# Patient Record
Sex: Female | Born: 1945 | Race: White | Hispanic: No | Marital: Married | Smoking: Former smoker
Health system: Southern US, Community
[De-identification: ages and names within clinical notes are randomized; demographics above are authoritative.]

## PROBLEM LIST (undated history)

## (undated) DIAGNOSIS — IMO0002 Reserved for concepts with insufficient information to code with codable children: Secondary | ICD-10-CM

## (undated) DIAGNOSIS — H409 Unspecified glaucoma: Secondary | ICD-10-CM

## (undated) DIAGNOSIS — I10 Essential (primary) hypertension: Secondary | ICD-10-CM

## (undated) DIAGNOSIS — I209 Angina pectoris, unspecified: Secondary | ICD-10-CM

## (undated) DIAGNOSIS — C801 Malignant (primary) neoplasm, unspecified: Secondary | ICD-10-CM

## (undated) DIAGNOSIS — IMO0001 Reserved for inherently not codable concepts without codable children: Secondary | ICD-10-CM

## (undated) DIAGNOSIS — N189 Chronic kidney disease, unspecified: Secondary | ICD-10-CM

## (undated) HISTORY — PX: DILATION AND CURETTAGE OF UTERUS: SHX78

## (undated) HISTORY — DX: Reserved for concepts with insufficient information to code with codable children: IMO0002

## (undated) HISTORY — DX: Reserved for inherently not codable concepts without codable children: IMO0001

## (undated) HISTORY — DX: Unspecified glaucoma: H40.9

## (undated) HISTORY — PX: OTHER SURGICAL HISTORY: SHX169

## (undated) HISTORY — PX: BACK SURGERY: SHX140

## (undated) HISTORY — PX: TUBAL LIGATION: SHX77

## (undated) HISTORY — PX: LITHOTRIPSY: SUR834

---

## 1998-06-17 ENCOUNTER — Other Ambulatory Visit: Admission: RE | Admit: 1998-06-17 | Discharge: 1998-06-17 | Payer: Self-pay | Admitting: Gynecology

## 1998-12-23 ENCOUNTER — Ambulatory Visit (HOSPITAL_COMMUNITY): Admission: RE | Admit: 1998-12-23 | Discharge: 1998-12-23 | Payer: Self-pay | Admitting: Urology

## 1998-12-23 ENCOUNTER — Encounter: Payer: Self-pay | Admitting: Urology

## 1999-06-30 ENCOUNTER — Other Ambulatory Visit: Admission: RE | Admit: 1999-06-30 | Discharge: 1999-06-30 | Payer: Self-pay | Admitting: Gynecology

## 2000-07-05 ENCOUNTER — Other Ambulatory Visit: Admission: RE | Admit: 2000-07-05 | Discharge: 2000-07-05 | Payer: Self-pay | Admitting: Gynecology

## 2000-08-02 ENCOUNTER — Encounter (INDEPENDENT_AMBULATORY_CARE_PROVIDER_SITE_OTHER): Payer: Self-pay | Admitting: Specialist

## 2000-08-02 ENCOUNTER — Other Ambulatory Visit: Admission: RE | Admit: 2000-08-02 | Discharge: 2000-08-02 | Payer: Self-pay | Admitting: Gynecology

## 2001-08-01 ENCOUNTER — Other Ambulatory Visit: Admission: RE | Admit: 2001-08-01 | Discharge: 2001-08-01 | Payer: Self-pay | Admitting: Gynecology

## 2001-10-09 ENCOUNTER — Ambulatory Visit (HOSPITAL_BASED_OUTPATIENT_CLINIC_OR_DEPARTMENT_OTHER): Admission: RE | Admit: 2001-10-09 | Discharge: 2001-10-09 | Payer: Self-pay | Admitting: Urology

## 2001-11-11 ENCOUNTER — Other Ambulatory Visit: Admission: RE | Admit: 2001-11-11 | Discharge: 2001-11-11 | Payer: Self-pay | Admitting: Dermatology

## 2002-08-05 ENCOUNTER — Other Ambulatory Visit: Admission: RE | Admit: 2002-08-05 | Discharge: 2002-08-05 | Payer: Self-pay | Admitting: Gynecology

## 2003-08-24 ENCOUNTER — Other Ambulatory Visit: Admission: RE | Admit: 2003-08-24 | Discharge: 2003-08-24 | Payer: Self-pay | Admitting: Gynecology

## 2004-09-05 ENCOUNTER — Other Ambulatory Visit: Admission: RE | Admit: 2004-09-05 | Discharge: 2004-09-05 | Payer: Self-pay | Admitting: Gynecology

## 2004-10-19 ENCOUNTER — Ambulatory Visit: Payer: Self-pay | Admitting: Internal Medicine

## 2005-10-02 ENCOUNTER — Other Ambulatory Visit: Admission: RE | Admit: 2005-10-02 | Discharge: 2005-10-02 | Payer: Self-pay | Admitting: Gynecology

## 2006-06-14 ENCOUNTER — Ambulatory Visit: Payer: Self-pay | Admitting: Internal Medicine

## 2006-08-29 ENCOUNTER — Ambulatory Visit: Payer: Self-pay | Admitting: Internal Medicine

## 2006-09-05 ENCOUNTER — Ambulatory Visit: Payer: Self-pay | Admitting: Internal Medicine

## 2008-12-16 ENCOUNTER — Ambulatory Visit (HOSPITAL_COMMUNITY): Admission: RE | Admit: 2008-12-16 | Discharge: 2008-12-16 | Payer: Self-pay | Admitting: Neurological Surgery

## 2009-01-21 ENCOUNTER — Ambulatory Visit (HOSPITAL_COMMUNITY): Admission: RE | Admit: 2009-01-21 | Discharge: 2009-01-21 | Payer: Self-pay | Admitting: Neurological Surgery

## 2011-02-03 DIAGNOSIS — R072 Precordial pain: Secondary | ICD-10-CM

## 2011-02-23 LAB — BASIC METABOLIC PANEL
BUN: 14 mg/dL (ref 6–23)
CO2: 29 mEq/L (ref 19–32)
Calcium: 10.1 mg/dL (ref 8.4–10.5)
Chloride: 104 mEq/L (ref 96–112)
Creatinine, Ser: 0.96 mg/dL (ref 0.4–1.2)
GFR calc Af Amer: >60 mL/min (ref >60)
GFR calc non Af Amer: 59 mL/min — ABNORMAL LOW (ref >60)
Glucose, Bld: 122 mg/dL — ABNORMAL HIGH (ref 70–99)
Potassium: 3.4 mEq/L — ABNORMAL LOW (ref 3.5–5.1)
Sodium: 143 mEq/L (ref 135–145)

## 2011-02-23 LAB — GLUCOSE, CAPILLARY
Glucose-Capillary: 130 mg/dL — ABNORMAL HIGH (ref 70–99)
Glucose-Capillary: 159 mg/dL — ABNORMAL HIGH (ref 70–99)

## 2011-02-23 LAB — CBC
Hemoglobin: 14.2 g/dL (ref 12.0–15.0)
MCHC: 35.3 g/dL (ref 30.0–36.0)
RBC: 4.43 MIL/uL (ref 3.87–5.11)
WBC: 8.3 10*3/uL (ref 4.0–10.5)

## 2011-03-28 NOTE — Op Note (Signed)
NAMEAUBRIEE, SZETO                ACCOUNT NO.:  [id number]   MEDICAL RECORD NO.:  [id number]          PATIENT TYPE:  OIB   LOCATION:  3526                         FACILITY:  [facility]   PHYSICIAN:  Stefani Dama, M.D.  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   PROCEDURE:  Microdiskectomy L3-L4 right with operating microscope  microdissection technique.   SURGEON:  Stefani Dama, MD   FIRST ASSISTANT:  Aura Fey. Dennison Bulla, [place]   ANESTHESIA:  General endotracheal.   INDICATIONS:  Pam Vazquez is a 65 year old individual who has previously  undergone decompression and fusion at L4-L5 secondary to degenerative  spondylolisthesis.  She did well for the last 15 years but has now  developed onset of weakness with initial pain in the right lower  extremity but now she is left with weakness in the iliopsoas and the  quad.  She has been advised regarding surgical decompression for a large  extruded fragment of disk at L3-4 behind the body of L3.   PROCEDURE:  The patient was brought to the operating room supine on a  stretcher.  After smooth induction of general endotracheal anesthesia,  she was turned prone.  The back was prepped with alcohol and DuraPrep  and draped in a sterile fashion.  Midline incision was created and  carried down to the lumbodorsal fascia on the left side.  The fascia was  opened on that side and subperiosteal dissection was performed at the L3-  L4 space which was identified positively with a radiograph.  Then, a  laminotomy was performed removing the inferior margin of the lamina of  L3.  Because the disk protruded significantly behind the body of L3, a  hemilaminectomy was  required and this was ultimately performed opening  the laminectomy site with a 2 and 3-mm Kerrison punch.  Underlying this,  the L3 nerve root was identified superiorly.  The L4 nerve root was  identified inferiorly.  There was a substantial amount of facet  arthrosis which was encountered and this was resected removing the  inferior facet joint partially at the L3-L4 joint.  With this, the disk  space was identified and it was noted to be bulging dorsally.  The  common dural tube was retracted medially and the disk space was incised.  Significant quantity of severely degenerated and desiccated disk  material was encountered.  The disk space was then entered and evacuated  of a substantial quantity of severely desiccated material also.  The  disk space was then explored medially and laterally and once all the  disk material that was loose and able to be resected was removed.  The  area was then checked for hemostasis in the soft tissues and the path of  the L3-L4 nerve roots were each identified and found to be free and  clear of any obstruction.  With this hemostasis in the soft tissues was obtained and the  lumbodorsal fascia was closed with #1 Vicryl in interrupted fashion, 2-0  Vicryl was used subcutaneously, 3-0 Vicryl subcuticularly and Dermabond  was placed on the skin.  The patient tolerated the procedure well.  Blood loss was estimated less than 100 mL.      Stefani Dama, M.D.  Electronically Signed     HJE/MEDQ  D:  01/21/2009  T:  01/21/2009  Job:  [id number]

## 2011-03-31 NOTE — Assessment & Plan Note (Signed)
[facility]                           GASTROENTEROLOGY OFFICE NOTE   NAME:Vazquez Vazquez KEENY                       MRN:          [id number]  DATE:06/14/2006                            DOB:          May 21, 1946    The patient is self-referred.   REASON FOR CONSULTATION:  Screening colonoscopy.   HISTORY:  This is a pleasant 65 year old white female with a history of type  2 diabetes mellitus, hypertension, kidney stones, and recent problems with  pyelonephritis who presents herself regarding screening colonoscopy.  The  patient's husband, Vazquez Vazquez, is a patient of mine.  The patient herself had  initiated a screening colonoscopy in December of 2005, though subsequently  canceled due to the cost.  She now wishes to reschedule at this time.  She  denies any active GI symptoms such as abdominal pain, change in bowel  habits, or rectal bleeding.   PAST MEDICAL HISTORY:  1.  Hypertension.  2.  Diabetes mellitus.  3.  History of kidney stones.  4.  Recent pyelonephritis.   PAST SURGICAL HISTORY:  1.  Tubal ligation.  2.  Breast surgery for fibrocystic disease.  3.  Lumbar spinal fusion.   ALLERGIES:  No known drug allergies.   CURRENT MEDICATIONS:  1.  Atenolol 50 mg p.o. daily.  2.  Triamterine/hydrochlorothiazide 37.5/25 mg daily.  3.  Premarin 0.625 mg daily.  4.  Prometrium 100 mg daily.  5.  Multivitamin daily.  6.  Aspirin 81 mg daily.  7.  Calcium 1200 mg daily.  8.  Fish oil, two daily.  9.  Potassium chloride 20 mEq daily.  10. Metformin 500 mg p.o. q.a.m.  11. Aleve p.r.n.   FAMILY HISTORY:  No family history of colon cancer or colon polyps.   SOCIAL HISTORY:  The patient is married with two children.  She is a Engineer, structural.  She worked for [employer] for 33 years, then  retired.  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  She does not smoke or use alcohol.   REVIEW OF  SYSTEMS:  Per diagnosis evaluation form.   PHYSICAL EXAMINATION:  GENERAL:  Well-appearing female in no acute distress.  VITAL SIGNS:  Blood pressure is 108/74, heart rate is 64, weight is 175.8  pounds, she is 5 feet 2 inches in height.  HEENT:  Sclerae anicteric.  Conjunctivae are pink.  Oral mucosa intact.  No  adenopathy.  LUNGS:  Clear to auscultation and percussion.  HEART:  Regular.  ABDOMEN:  Soft without tenderness, mass, or hernia.  Good bowel sounds  heard.  RECTAL:  Exam is deferred.  EXTREMITIES:  Without edema.   LABORATORIES:  Laboratories from Dr. Margo Vazquez on May 23, 2006 reveal white  blood cell count of 15.3, hemoglobin 12.1, platelet count 164,000.  Electrolytes were normal.  Glucose elevated at 194.   X-RAY FINDINGS:  Recent CT scan to evaluate right-sided abdominal, pelvic,  and flank pain revealed no acute abnormality.  She was noted to have a  calcified soft tissue lesion involving the  small bowel and uterus which was  felt possibly to be a fibroid.  Apparently, her gynecologist is aware.   IMPRESSION:  This is a 65 year old diabetic who presents regarding screening  colonoscopy.  She is an appropriate candidate without contraindication.  The  nature of the procedure, as well as the risks, benefits, and alternatives  have been reviewed.  She understood and agreed to proceed.  We will hold her  oral diabetic agent the day of her exam.                                   Vazquez Vazquez. Vazquez Vazquez., MD   JNP/MedQ  DD:  06/14/2006  DT:  06/14/2006  Job #:  [id number]   cc:   Vazquez Potter, MD

## 2011-03-31 NOTE — Op Note (Signed)
[facility]  Patient:    Pam Vazquez, Pam Vazquez Visit Number: [id number] MRN: [id number]          Service Type: NES Location: [facility] Attending Physician:  Lauree Chandler Dictated by:   Maretta Bees. Vonita Moss, M.D. Proc. Date: 10/09/01 Admit Date:  10/09/2001   CC:         Dietrich Pates, M.D. [facility]   Operative Report  PREOPERATIVE DIAGNOSIS:  Distal left ureteral calculus.  POSTOPERATIVE DIAGNOSIS:  Distal left ureteral calculus.  PROCEDURE:  Cystoscopy, left ureteroscopy and left ureteroscopic stone basketing and removal.  SURGEON:  Maretta Bees. Vonita Moss, M.D.  ANESTHESIA:  General.  INDICATIONS FOR PROCEDURE:  This 65 year old lady has had a past history of stone disease and has been bothered recently by intermittent left flank pain which was fairly severe yesterday. She had been previously scheduled for cystoscopy and ureteroscopy today and has not passed a stone.  She was advised about the risks of infection, bleeding, or persistent obstruction.  DESCRIPTION OF PROCEDURE:  The patient was brought to the operating room and placed in lithotomy position. The external genitalia were prepped and draped in the usual fashion. She was cystoscoped and her bladder was unremarkable. A guidewire was placed up under fluoroscopic control into the left ureter and it seemed to slip easily past what I thought was her stone. I then inserted the 6 French ureteroscope and just beyond the intramural ureter I found a yellow irregular calculus which I eventually removed with a nitinol stone basket. The stone was removed intact. There was no unusual injury or trauma to the ureter after rescoping her and therefore I felt it was appropriate to leave out a double J catheter, so removed the guidewire, drained the bladder, put xylocaine jelly in the bladder. She was given a B&O suppository and taken to the recovery room in good condition. Dictated by:   Maretta Bees. Vonita Moss,  M.D. Attending Physician:  Lauree Chandler DD:  10/09/01 TD:  10/09/01 Job: [id number]

## 2011-11-09 ENCOUNTER — Other Ambulatory Visit (HOSPITAL_COMMUNITY): Payer: Self-pay | Admitting: Gynecology

## 2011-11-09 DIAGNOSIS — N950 Postmenopausal bleeding: Secondary | ICD-10-CM

## 2011-11-13 ENCOUNTER — Ambulatory Visit (HOSPITAL_COMMUNITY)
Admission: RE | Admit: 2011-11-13 | Discharge: 2011-11-13 | Disposition: A | Discharge status: Home / Self Care | Payer: PRIVATE HEALTH INSURANCE | Source: Ambulatory Visit | Attending: Gynecology | Admitting: Gynecology

## 2011-11-13 DIAGNOSIS — N950 Postmenopausal bleeding: Secondary | ICD-10-CM

## 2011-11-17 ENCOUNTER — Encounter (HOSPITAL_COMMUNITY): Payer: Self-pay | Admitting: Pharmacist

## 2011-11-17 ENCOUNTER — Encounter (HOSPITAL_COMMUNITY): Payer: Self-pay | Admitting: *Deleted

## 2011-11-20 ENCOUNTER — Encounter (HOSPITAL_COMMUNITY)
Admission: RE | Admit: 2011-11-20 | Discharge: 2011-11-20 | Disposition: A | Discharge status: Home / Self Care | Payer: PRIVATE HEALTH INSURANCE | Source: Ambulatory Visit | Attending: Obstetrics and Gynecology | Admitting: Obstetrics and Gynecology

## 2011-11-20 ENCOUNTER — Encounter (HOSPITAL_COMMUNITY): Payer: Self-pay

## 2011-11-20 ENCOUNTER — Other Ambulatory Visit: Payer: Self-pay

## 2011-11-20 LAB — BASIC METABOLIC PANEL
BUN: 17 mg/dL (ref 6–23)
CO2: 31 mEq/L (ref 19–32)
Calcium: 10.9 mg/dL — ABNORMAL HIGH (ref 8.4–10.5)
Chloride: 100 mEq/L (ref 96–112)
Creatinine, Ser: 0.84 mg/dL (ref 0.50–1.10)
GFR calc Af Amer: 83 mL/min — ABNORMAL LOW (ref >90)
GFR calc non Af Amer: 72 mL/min — ABNORMAL LOW (ref >90)
Glucose, Bld: 110 mg/dL — ABNORMAL HIGH (ref 70–99)
Potassium: 3.9 mEq/L (ref 3.5–5.1)
Sodium: 138 mEq/L (ref 135–145)

## 2011-11-20 LAB — CBC
HCT: 41.8 % (ref 36.0–46.0)
Hemoglobin: 14 g/dL (ref 12.0–15.0)
MCH: 31.2 pg (ref 26.0–34.0)
MCHC: 33.5 g/dL (ref 30.0–36.0)
MCV: 93.1 fL (ref 78.0–100.0)
Platelets: 246 10*3/uL (ref 150–400)
RBC: 4.49 MIL/uL (ref 3.87–5.11)
RDW: 13.5 % (ref 11.5–15.5)
WBC: 9.4 10*3/uL (ref 4.0–10.5)

## 2011-11-20 MED ORDER — LACTATED RINGERS IV SOLN: INTRAVENOUS | Status: DC

## 2011-11-20 NOTE — Patient Instructions (Signed)
   Your procedure is scheduled on: January 8 at 11:30am  Enter through the Main Entrance of [facility] at:1000 Pick up the phone at the desk and dial [phone] and inform us of your arrival.  Please call this number if you have any problems the morning of surgery: [phone]  Remember: Do not eat food after midnight: Tonight Do not drink clear liquids after:0700 Take these medicines the morning of surgery with a SIP OF WATER:  Lisinopril Lopressor  Do not wear jewelry, make-up, or FINGER nail polish Do not wear lotions, powders, perfumes or deodorant. Do not shave 48 hours prior to surgery. Do not bring valuables to the hospital.  Leave suitcase in the car. After Surgery it may be brought to your room. For patients being admitted to the hospital, checkout time is 11:00am the day of discharge.  Patients discharged on the day of surgery will not be allowed to drive home.     Remember to use your hibiclens as instructed.Please shower with 1/2 bottle the evening before your surgery and the other 1/2 bottle the morning of surgery.

## 2011-11-21 ENCOUNTER — Other Ambulatory Visit: Payer: Self-pay | Admitting: Obstetrics and Gynecology

## 2011-11-21 ENCOUNTER — Encounter (HOSPITAL_COMMUNITY): Payer: Self-pay | Admitting: Anesthesiology

## 2011-11-21 ENCOUNTER — Encounter (HOSPITAL_COMMUNITY)
Admission: RE | Disposition: A | Discharge status: Home / Self Care | Payer: Self-pay | Source: Ambulatory Visit | Attending: Obstetrics and Gynecology

## 2011-11-21 ENCOUNTER — Ambulatory Visit (HOSPITAL_COMMUNITY): Payer: PRIVATE HEALTH INSURANCE | Admitting: Anesthesiology

## 2011-11-21 ENCOUNTER — Ambulatory Visit (HOSPITAL_COMMUNITY)
Admission: RE | Admit: 2011-11-21 | Discharge: 2011-11-21 | Disposition: A | Discharge status: Home / Self Care | Payer: PRIVATE HEALTH INSURANCE | Source: Ambulatory Visit | Attending: Obstetrics and Gynecology | Admitting: Obstetrics and Gynecology

## 2011-11-21 DIAGNOSIS — Z01812 Encounter for preprocedural laboratory examination: Secondary | ICD-10-CM | POA: Insufficient documentation

## 2011-11-21 DIAGNOSIS — E119 Type 2 diabetes mellitus without complications: Secondary | ICD-10-CM | POA: Insufficient documentation

## 2011-11-21 DIAGNOSIS — N950 Postmenopausal bleeding: Secondary | ICD-10-CM | POA: Insufficient documentation

## 2011-11-21 DIAGNOSIS — I10 Essential (primary) hypertension: Secondary | ICD-10-CM | POA: Insufficient documentation

## 2011-11-21 DIAGNOSIS — Z01818 Encounter for other preprocedural examination: Secondary | ICD-10-CM | POA: Insufficient documentation

## 2011-11-21 HISTORY — DX: Essential (primary) hypertension: I10

## 2011-11-21 HISTORY — PX: HYSTEROSCOPY WITH D & C: SHX1775

## 2011-11-21 LAB — GLUCOSE, CAPILLARY: Glucose-Capillary: 117 mg/dL — ABNORMAL HIGH (ref 70–99)

## 2011-11-21 SURGERY — DILATATION AND CURETTAGE /HYSTEROSCOPY
Anesthesia: General | Site: Vagina | Wound class: Clean Contaminated

## 2011-11-21 MED ORDER — GLYCINE 1.5 % IR SOLN
Status: DC | PRN
  Administered 2011-11-21: 3000 mL

## 2011-11-21 MED ORDER — FENTANYL CITRATE 0.05 MG/ML IJ SOLN
INTRAMUSCULAR | Status: AC
  Filled 2011-11-21: qty 2

## 2011-11-21 MED ORDER — ONDANSETRON HCL 4 MG/2ML IJ SOLN
INTRAMUSCULAR | Status: AC
  Filled 2011-11-21: qty 2

## 2011-11-21 MED ORDER — DEXAMETHASONE SODIUM PHOSPHATE 10 MG/ML IJ SOLN
INTRAMUSCULAR | Status: AC
  Filled 2011-11-21: qty 1

## 2011-11-21 MED ORDER — LACTATED RINGERS IV SOLN
INTRAVENOUS | Status: DC | PRN
  Administered 2011-11-21: 12:00:00 via INTRAVENOUS

## 2011-11-21 MED ORDER — KETOROLAC TROMETHAMINE 30 MG/ML IJ SOLN
INTRAMUSCULAR | Status: DC | PRN
  Administered 2011-11-21: 30 mg via INTRAVENOUS

## 2011-11-21 MED ORDER — KETOROLAC TROMETHAMINE 60 MG/2ML IM SOLN
INTRAMUSCULAR | Status: AC
  Filled 2011-11-21: qty 2

## 2011-11-21 MED ORDER — LIDOCAINE HCL (CARDIAC) 20 MG/ML IV SOLN
INTRAVENOUS | Status: DC | PRN
  Administered 2011-11-21: 80 mg via INTRAVENOUS

## 2011-11-21 MED ORDER — LIDOCAINE HCL 1 % IJ SOLN
INTRAMUSCULAR | Status: DC | PRN
  Administered 2011-11-21: 10 mL

## 2011-11-21 MED ORDER — LIDOCAINE HCL (CARDIAC) 20 MG/ML IV SOLN
INTRAVENOUS | Status: AC
  Filled 2011-11-21: qty 5

## 2011-11-21 MED ORDER — FENTANYL CITRATE 0.05 MG/ML IJ SOLN
INTRAMUSCULAR | Status: DC | PRN
  Administered 2011-11-21 (×2): 50 ug via INTRAVENOUS

## 2011-11-21 MED ORDER — DEXAMETHASONE SODIUM PHOSPHATE 4 MG/ML IJ SOLN
INTRAMUSCULAR | Status: DC | PRN
  Administered 2011-11-21: 4 mg via INTRAVENOUS

## 2011-11-21 MED ORDER — MIDAZOLAM HCL 5 MG/5ML IJ SOLN
INTRAMUSCULAR | Status: DC | PRN
  Administered 2011-11-21: 1 mg via INTRAVENOUS

## 2011-11-21 MED ORDER — KETOROLAC TROMETHAMINE 60 MG/2ML IM SOLN
INTRAMUSCULAR | Status: DC | PRN
  Administered 2011-11-21: 30 mg via INTRAMUSCULAR

## 2011-11-21 MED ORDER — PROPOFOL 10 MG/ML IV EMUL
INTRAVENOUS | Status: DC | PRN
  Administered 2011-11-21: 150 mg via INTRAVENOUS

## 2011-11-21 MED ORDER — PROPOFOL 10 MG/ML IV EMUL
INTRAVENOUS | Status: AC
  Filled 2011-11-21: qty 20

## 2011-11-21 MED ORDER — MIDAZOLAM HCL 2 MG/2ML IJ SOLN
INTRAMUSCULAR | Status: AC
  Filled 2011-11-21: qty 2

## 2011-11-21 MED ORDER — ONDANSETRON HCL 4 MG/2ML IJ SOLN
INTRAMUSCULAR | Status: DC | PRN
  Administered 2011-11-21: 4 mg via INTRAVENOUS

## 2011-11-21 MED ORDER — GLYCOPYRROLATE 0.2 MG/ML IJ SOLN
INTRAMUSCULAR | Status: AC
  Filled 2011-11-21: qty 1

## 2011-11-21 SURGICAL SUPPLY — 14 items
ABLATOR ENDOMETRIAL BIPOLAR (ABLATOR) IMPLANT
CANISTER SUCTION 2500CC (MISCELLANEOUS) ×2 IMPLANT
CATH ROBINSON RED A/P 16FR (CATHETERS) ×2 IMPLANT
CLOTH BEACON ORANGE TIMEOUT ST (SAFETY) ×2 IMPLANT
CONTAINER PREFILL 10% NBF 60ML (FORM) ×4 IMPLANT
ELECT REM PT RETURN 9FT ADLT (ELECTROSURGICAL)
ELECTRODE REM PT RTRN 9FT ADLT (ELECTROSURGICAL) IMPLANT
GLOVE BIO SURGEON STRL SZ7.5 (GLOVE) ×4 IMPLANT
GOWN PREVENTION PLUS LG XLONG (DISPOSABLE) ×2 IMPLANT
GOWN STRL REIN XL XLG (GOWN DISPOSABLE) ×2 IMPLANT
LOOP ANGLED CUTTING 22FR (CUTTING LOOP) IMPLANT
PACK HYSTEROSCOPY LF (CUSTOM PROCEDURE TRAY) ×2 IMPLANT
TOWEL OR 17X24 6PK STRL BLUE (TOWEL DISPOSABLE) ×4 IMPLANT
WATER STERILE IRR 1000ML POUR (IV SOLUTION) ×2 IMPLANT

## 2011-11-21 NOTE — Transfer of Care (Signed)
Immediate Anesthesia Transfer of Care Note  Patient: Pam Vazquez  Procedure(s) Performed:  DILATATION AND CURETTAGE /HYSTEROSCOPY  Patient Location: PACU  Anesthesia Type: MAC  Level of Consciousness: awake, alert  and oriented  Airway & Oxygen Therapy: Patient Spontanous Breathing and Patient connected to nasal cannula oxygen  Post-op Assessment: Report given to PACU RN and Patient moving all extremities X 4  Post vital signs: Reviewed and stable  Complications: No apparent anesthesia complications

## 2011-11-21 NOTE — Brief Op Note (Signed)
11/21/2011  11:58 AM  PATIENT:  Pam Vazquez  66 y.o. female with post menopausal bleeding and thickened endometrium  PRE-OPERATIVE DIAGNOSIS:  thickened endometrium  POST-OPERATIVE DIAGNOSIS:  thickened endometrium  PROCEDURE:  Procedure(s): DILATATION AND CURETTAGE /HYSTEROSCOPY  SURGEON:  Surgeon(s): Miguel Aschoff   ANESTHESIA:   general  EBL:   30cc  BLOOD ADMINISTERED:none  DRAINS: none   LOCAL MEDICATIONS USED:  LIDOCAINE 10 CC  SPECIMEN:  Source of Specimen:  endometrial currettings  DISPOSITION OF SPECIMEN:  PATHOLOGY  COUNTS:  YES  TOURNIQUET:  * No tourniquets in log *  DICTATION: .Other Dictation: Dictation Number [id number]  PLAN OF CARE: Discharge to home after PACU  PATIENT DISPOSITION:  PACU - hemodynamically stable.   Delay start of Pharmacological VTE agent (>24hrs) due to surgical blood loss or risk of bleeding:  {YES/NO/NOT APPLICABLE:20182

## 2011-11-21 NOTE — Anesthesia Procedure Notes (Signed)
Procedure Name: LMA Insertion Date/Time: 11/21/2011 11:38 AM Performed by: Karleen Dolphin Pre-anesthesia Checklist: Patient identified, Patient being monitored, Emergency Drugs available, Timeout performed and Suction available Patient Re-evaluated:Patient Re-evaluated prior to inductionOxygen Delivery Method: Circle System Utilized Preoxygenation: Pre-oxygenation with 100% oxygen Intubation Type: IV induction LMA: LMA inserted LMA Size: 4.0 Number of attempts: 1 Placement Confirmation: positive ETCO2 and breath sounds checked- equal and bilateral Tube secured with: Tape Dental Injury: Teeth and Oropharynx as per pre-operative assessment

## 2011-11-21 NOTE — H&P (Signed)
66 year old white female who presented with vaginal spotting in December 2012. Transvaginal ultrasound revealed very thickened endometrium at 2.8 cm. She is now being taken to the operating room to undergo D&C and hysteroscopy to rule out endometrial neoplasia. Had been on HRT until 2010.  PMH Postive for Diabetes, Hypertension  Allergies: none  Meds: Metformin, Metoprolol, lisinopril, fish oil, D3  ROS: Respiratory: no SOB or cough            GI: No Nausea , vomiting, diarrhea or constipation. No melena            GU: No dysuria, frequency or urgency, no hematuria                 Gyn: positive for spotting, no discharge or itch. No pelvic pain  Physical exam:                       HEENT: PERRLA           Chest clear to P&A            Heart regular rhythm no murmur or gallop            Abdomen is soft no enlargement of the liver, kidneys, or spleen            Pelvic: External genitalia wnl,                         BUS: wnl                         Vagina without gross lesion scant discharge                          Cervix without lesion                          Uterus is anterior normal size shape and position                          Adnexa without mass non tender  Impression: Post menopausal bleeding with abnormal endometrial stripe.   Plan: D&C hysteroscopy to rule out endometrial hyperplasia.

## 2011-11-21 NOTE — Anesthesia Preprocedure Evaluation (Signed)
Anesthesia Evaluation  Patient identified by MRN, date of birth, ID band Patient awake    Reviewed: Allergy & Precautions, H&P , NPO status , Patient's Chart, lab work & pertinent test results  Airway Mallampati: II TM Distance: >3 FB Neck ROM: full    Dental No notable dental hx. (+) Teeth Intact   Pulmonary neg pulmonary ROS,    Pulmonary exam normal       Cardiovascular     Neuro/Psych Negative Neurological ROS  Negative Psych ROS   GI/Hepatic negative GI ROS, Neg liver ROS,   Endo/Other  Diabetes mellitus-, Type 2, Oral Hypoglycemic Agents  Renal/GU negative Renal ROS  Genitourinary negative   Musculoskeletal negative musculoskeletal ROS (+)   Abdominal Normal abdominal exam  (+)   Peds negative pediatric ROS (+)  Hematology negative hematology ROS (+)   Anesthesia Other Findings   Reproductive/Obstetrics negative OB ROS                           Anesthesia Physical Anesthesia Plan  ASA: II  Anesthesia Plan: General   Post-op Pain Management:    Induction: Intravenous  Airway Management Planned: LMA  Additional Equipment:   Intra-op Plan:   Post-operative Plan:   Informed Consent: I have reviewed the patients History and Physical, chart, labs and discussed the procedure including the risks, benefits and alternatives for the proposed anesthesia with the patient or authorized representative who has indicated his/her understanding and acceptance.     Plan Discussed with: CRNA  Anesthesia Plan Comments:         Anesthesia Quick Evaluation

## 2011-11-21 NOTE — Anesthesia Postprocedure Evaluation (Signed)
  Anesthesia Post-op Note  Patient: Pam Vazquez  Procedure(s) Performed:  DILATATION AND CURETTAGE /HYSTEROSCOPY  Patient Location: PACU  Anesthesia Type: General  Level of Consciousness: awake, alert  and oriented  Airway and Oxygen Therapy: Patient Spontanous Breathing  Post-op Pain: none  Post-op Assessment: Post-op Vital signs reviewed, Patient's Cardiovascular Status Stable, Respiratory Function Stable, Patent Airway, No signs of Nausea or vomiting and Pain level controlled  Post-op Vital Signs: Reviewed and stable  Complications: No apparent anesthesia complications

## 2011-11-22 ENCOUNTER — Encounter (HOSPITAL_COMMUNITY): Payer: Self-pay | Admitting: Obstetrics and Gynecology

## 2011-11-22 NOTE — Op Note (Signed)
NAMESARINA, Pam NO.:  [id number]  MEDICAL RECORD NO.:  [id number]  LOCATION:  [facility]                          FACILITY:  [facility]  PHYSICIAN:  Miguel Aschoff, M.D.       DATE OF BIRTH:  1946-10-03  DATE OF PROCEDURE:  11/21/2011 DATE OF DISCHARGE:  11/21/2011                              OPERATIVE REPORT   PREOPERATIVE DIAGNOSIS:  Postmenopausal bleeding.  POSTOPERATIVE DIAGNOSIS:  Postmenopausal bleeding, rule out endometrial neoplasia.  PROCEDURES:  Cervical dilatation, hysteroscopy, uterine curettage.  SURGEON:  Miguel Aschoff, MD  ANESTHESIA:  General.  COMPLICATIONS:  None.  JUSTIFICATION:  The patient is a 66 year old white female with history of postmenopausal bleeding.  Assessment of her endometrial stripe revealed an endometrial stripe measuring 2.8 cm.  Because of postmenopausal bleeding in this abnormally thickened endometrial stripe, she presents now to undergo D and C and hysteroscopy to see if an etiology for the bleeding can be established and to rule out any endometrial neoplasia.  The risks and benefits of the procedure were discussed with the patient and informed consent has been obtained.  PROCEDURE:  The patient was taken to the operating room, placed in the supine position.  General anesthesia was administered without difficulty.  She was then placed in the dorsal lithotomy position, prepped and draped in usual sterile fashion.  Bladder was catheterized. Examination under anesthesia revealed normal external genitalia.  Normal Bartholin, Skene glands.  Normal urethra.  The vaginal vault was without gross lesion.  The cervix was without gross lesion.  Uterus was globular in shape, top normal size.  No adnexal masses were noted.  At this point, speculum was placed in the vaginal vault.  Anterior cervical lip was grasped with tenaculum and then using serial Pratt dilators, the endocervical canal was dilated until a #25 Pratt dilator could  be passed.  At this point, the diagnostic hysteroscope was advanced through the endocervical canal.  No endocervical lesions were noted on entering the endometrial cavity; however, a large amount of tissue was noted as well as the polypoid mass originating from the right lateral portion of the uterus.  At this point, the hysteroscope was removed.  Polyp forceps were introduced and a large amount of abnormal-appearing tissue was obtained.  This was collected and sent for histologic study.  After this was done, sharp vigorous curettage was carried out again with an exceedingly large amount of tissue being obtained, worrisome for significant endometrial neoplasia.  On completion of the curettage with no additional tissue being obtained, it was elected to complete the procedure.  The cervix was injected with total of 10 mL of 1% Xylocaine for postop analgesia.  All instruments were removed.  Hemostasis was readily achieved and the procedure was completed.  The patient reversed from anesthetic and taken to recovery in satisfactory condition.  The estimated blood loss was approximately 30 mL.  The patient tolerated the procedure well.  PLAN:  Plan for the patient to be discharged home.  Medications for home include Cataflam 50 mg 1 p.o. t.i.d. as needed for pain.  The patient is to resume all other preoperative medications.  She will be seen back in 2  weeks for followup examination.  She is to call on January 11 for her pathology report.  She is to call for any problems such as fever, pain, or heavy bleeding.     Miguel Aschoff, M.D.     AR/MEDQ  D:  11/21/2011  T:  11/22/2011  Job:  [id number]

## 2011-11-23 ENCOUNTER — Encounter: Payer: Self-pay | Admitting: Gynecologic Oncology

## 2011-11-27 ENCOUNTER — Encounter: Payer: Self-pay | Admitting: Gynecology

## 2011-11-27 ENCOUNTER — Ambulatory Visit: Payer: PRIVATE HEALTH INSURANCE | Attending: Gynecology | Admitting: Gynecology

## 2011-11-27 DIAGNOSIS — I10 Essential (primary) hypertension: Secondary | ICD-10-CM | POA: Insufficient documentation

## 2011-11-27 DIAGNOSIS — C549 Malignant neoplasm of corpus uteri, unspecified: Secondary | ICD-10-CM | POA: Insufficient documentation

## 2011-11-27 DIAGNOSIS — E119 Type 2 diabetes mellitus without complications: Secondary | ICD-10-CM | POA: Insufficient documentation

## 2011-11-27 DIAGNOSIS — Z79899 Other long term (current) drug therapy: Secondary | ICD-10-CM | POA: Insufficient documentation

## 2011-11-27 DIAGNOSIS — Z7982 Long term (current) use of aspirin: Secondary | ICD-10-CM | POA: Insufficient documentation

## 2011-11-27 DIAGNOSIS — C541 Malignant neoplasm of endometrium: Secondary | ICD-10-CM

## 2011-11-27 NOTE — Progress Notes (Signed)
Consult Note: Gyn-Onc   Pam Vazquez 65 y.o. female  Chief Complaint  Patient presents with  . Endo adenocarcinoma    New pt       HPI: 65-year-old white female seen in consultation request of Dr. Alan Ross for management of a newly diagnosed adenocarcinoma of the endometrium. Patient's had postmenopausal bleeding since October 2012 ultimately saw Dr. Ross who undertook a D&C on 11/21/2011. Final pathology showed a grade 1 endometrial cancer.  Patient has no significant past gynecologic history. She took hormone replacement for a number of years but has not taken any since 2010.  Obstetrical history gravida 2 Currently she is having light vaginal bleeding. She denies any abdominal or pelvic pain. She has no GI or GU symptoms.  No Known Allergies  Past Medical History  Diagnosis Date  . Diabetes mellitus   . Hypertension     Past Surgical History  Procedure Date  . Back surgery     91 and 2001  . Hysteroscopy w/d&c 11/21/2011    Procedure: DILATATION AND CURETTAGE /HYSTEROSCOPY;  Surgeon: Allan Ross;  Location: [facility];  Service: Gynecology;  Laterality: N/A;  . Tubal ligation   . Breast mass removal 1984, 1992    L breast  . Lithotripsy 2000, 2002    Current Outpatient Prescriptions  Medication Sig Dispense Refill  . aspirin 81 MG chewable tablet Chew 81 mg by mouth daily. Is being held prior to surgery       . cholecalciferol (VITAMIN D) 1000 UNITS tablet Take 2,000 Units by mouth daily.        . lisinopril-hydrochlorothiazide (PRINZIDE,ZESTORETIC) 20-25 MG per tablet Take 1 tablet by mouth daily.        . Magnesium 250 MG TABS Take 1 tablet by mouth daily.        . metFORMIN (GLUCOPHAGE) 500 MG tablet Take 500 mg by mouth daily with breakfast.        . metoprolol tartrate (LOPRESSOR) 25 MG tablet Take 25 mg by mouth 2 (two) times daily.        . Multiple Vitamins-Minerals (CENTRUM SILVER PO) Take 1 tablet by mouth daily.        . Omega-3 Fatty Acids (FISH OIL) 1200  MG CAPS Take 1 capsule by mouth daily.        . vitamin B-12 (CYANOCOBALAMIN) 500 MCG tablet Take 500 mcg by mouth daily.        . vitamin C (ASCORBIC ACID) 500 MG tablet Take 500 mg by mouth daily.          History   Social History  . Marital Status: Married    Spouse Name: N/A    Number of Children: N/A  . Years of Education: N/A   Occupational History  . Not on file.   Social History Main Topics  . Smoking status: Former Smoker  . Smokeless tobacco: Not on file  . Alcohol Use: No  . Drug Use: No  . Sexually Active: No   Other Topics Concern  . Not on file   Social History Narrative  . No narrative on file    Family History  Problem Relation Age of Onset  . Heart disease Mother   . Heart attack Mother     Review of Systems: 10 point review of systems is negative except as noted above  Vitals: Blood pressure 148/82, pulse 80, temperature 98.3 F (36.8 C), resp. rate 18, height 5' 3.78" (1.62 m), weight 180 lb 3.2 oz (  81.738 kg).  Physical Exam: In general is a healthy white female no acute distress HEENT is negative  Neck is supple without thyromegaly  There is no supraclavicular or inguinal adenopathy  The abdomen is moderately obese soft nontender no masses again a megaly ascites or hernias are noted  Pelvic exam EGBUS vagina bladder urethra are normal.  Cervix is normal  Uterus is anterior and normal shape size and consistency  There are no adnexal masses rectovaginal exam confirms    Assessment/Plan: Grade 1 endometrial carcinoma.  I recommend the patient undergo hysterectomy bilateral salpingo-oophorectomy and possible lymphadenectomy depending upon the eye surgical findings. Pros and cons of open versus robotic surgery were discussed. Patient has elected to proceed with robotic surgery and we'll schedule this for the very near future.  The risks of surgery including hemorrhage infection injury to adjacent viscera thromboembolic complications and  anesthetic risks were all discussed. All of her questions are answered  Surgery is scheduled for 12/19/2011. Patient understands that either Dr. Gehrig or Dr. Bewster will be performing the surgery.  CLARKE-PEARSON,Garrick Midgley L, MD 11/27/2011, 4:03 PM                         Consult Note: Gyn-Onc   Pam Vazquez 65 y.o. female  Chief Complaint  Patient presents with  . Endo adenocarcinoma    New pt    Interval History:   HPI:  No Known Allergies  Past Medical History  Diagnosis Date  . Diabetes mellitus   . Hypertension     Past Surgical History  Procedure Date  . Back surgery     91 and 2001  . Hysteroscopy w/d&c 11/21/2011    Procedure: DILATATION AND CURETTAGE /HYSTEROSCOPY;  Surgeon: Allan Ross;  Location: [facility];  Service: Gynecology;  Laterality: N/A;  . Tubal ligation   . Breast mass removal 1984, 1992    L breast  . Lithotripsy 2000, 2002    Current Outpatient Prescriptions  Medication Sig Dispense Refill  . aspirin 81 MG chewable tablet Chew 81 mg by mouth daily. Is being held prior to surgery       . cholecalciferol (VITAMIN D) 1000 UNITS tablet Take 2,000 Units by mouth daily.        . lisinopril-hydrochlorothiazide (PRINZIDE,ZESTORETIC) 20-25 MG per tablet Take 1 tablet by mouth daily.        . Magnesium 250 MG TABS Take 1 tablet by mouth daily.        . metFORMIN (GLUCOPHAGE) 500 MG tablet Take 500 mg by mouth daily with breakfast.        . metoprolol tartrate (LOPRESSOR) 25 MG tablet Take 25 mg by mouth 2 (two) times daily.        . Multiple Vitamins-Minerals (CENTRUM SILVER PO) Take 1 tablet by mouth daily.        . Omega-3 Fatty Acids (FISH OIL) 1200 MG CAPS Take 1 capsule by mouth daily.        . vitamin B-12 (CYANOCOBALAMIN) 500 MCG tablet Take 500 mcg by mouth daily.        . vitamin C (ASCORBIC ACID) 500 MG tablet Take 500 mg by mouth daily.          History   Social History  . Marital Status: Married    Spouse Name: N/A      Number of Children: N/A  . Years of Education: N/A   Occupational History  . Not on file.     Social History Main Topics  . Smoking status: Former Smoker  . Smokeless tobacco: Not on file  . Alcohol Use: No  . Drug Use: No  . Sexually Active: No   Other Topics Concern  . Not on file   Social History Narrative  . No narrative on file    Family History  Problem Relation Age of Onset  . Heart disease Mother   . Heart attack Mother     Review of Systems:  Vitals: Blood pressure 148/82, pulse 80, temperature 98.3 F (36.8 C), resp. rate 18, height 5' 3.78" (1.62 m), weight 180 lb 3.2 oz (81.738 kg).  Physical Exam:  Assessment/Plan:   CLARKE-PEARSON,Crawford Tamura L, MD 11/27/2011, 4:03 PM                         

## 2011-11-27 NOTE — Patient Instructions (Signed)
Surgery scheduled for February 5. Preoperative evaluation be performed prior to surgery.

## 2011-12-05 ENCOUNTER — Encounter (HOSPITAL_COMMUNITY): Payer: Self-pay | Admitting: Pharmacy Technician

## 2011-12-15 ENCOUNTER — Encounter (HOSPITAL_COMMUNITY): Payer: Self-pay

## 2011-12-15 ENCOUNTER — Ambulatory Visit (HOSPITAL_COMMUNITY)
Admission: RE | Admit: 2011-12-15 | Discharge: 2011-12-15 | Disposition: A | Discharge status: Home / Self Care | Payer: PRIVATE HEALTH INSURANCE | Source: Ambulatory Visit | Attending: Obstetrics & Gynecology | Admitting: Obstetrics & Gynecology

## 2011-12-15 ENCOUNTER — Encounter (HOSPITAL_COMMUNITY)
Admission: RE | Admit: 2011-12-15 | Discharge: 2011-12-15 | Disposition: A | Discharge status: Home / Self Care | Payer: PRIVATE HEALTH INSURANCE | Source: Ambulatory Visit | Attending: Obstetrics & Gynecology | Admitting: Obstetrics & Gynecology

## 2011-12-15 DIAGNOSIS — Z01812 Encounter for preprocedural laboratory examination: Secondary | ICD-10-CM | POA: Insufficient documentation

## 2011-12-15 DIAGNOSIS — E119 Type 2 diabetes mellitus without complications: Secondary | ICD-10-CM | POA: Insufficient documentation

## 2011-12-15 DIAGNOSIS — Z01818 Encounter for other preprocedural examination: Secondary | ICD-10-CM | POA: Insufficient documentation

## 2011-12-15 DIAGNOSIS — I10 Essential (primary) hypertension: Secondary | ICD-10-CM | POA: Insufficient documentation

## 2011-12-15 HISTORY — DX: Angina pectoris, unspecified: I20.9

## 2011-12-15 HISTORY — DX: Chronic kidney disease, unspecified: N18.9

## 2011-12-15 HISTORY — DX: Malignant (primary) neoplasm, unspecified: C80.1

## 2011-12-15 LAB — COMPREHENSIVE METABOLIC PANEL
ALT: 19 U/L (ref 0–35)
AST: 22 U/L (ref 0–37)
Albumin: 4.1 g/dL (ref 3.5–5.2)
CO2: 28 mEq/L (ref 19–32)
Chloride: 103 mEq/L (ref 96–112)
Creatinine, Ser: 0.89 mg/dL (ref 0.50–1.10)
Sodium: 141 mEq/L (ref 135–145)
Total Bilirubin: 0.8 mg/dL (ref 0.3–1.2)

## 2011-12-15 LAB — DIFFERENTIAL
Basophils Relative: 0 % (ref 0–1)
Eosinophils Relative: 2 % (ref 0–5)
Lymphocytes Relative: 34 % (ref 12–46)
Monocytes Absolute: 0.8 10*3/uL (ref 0.1–1.0)
Monocytes Relative: 9 % (ref 3–12)
Neutro Abs: 4.7 10*3/uL (ref 1.7–7.7)

## 2011-12-15 LAB — CBC
MCV: 91.8 fL (ref 78.0–100.0)
Platelets: 239 10*3/uL (ref 150–400)
RBC: 4.5 MIL/uL (ref 3.87–5.11)
RDW: 13.4 % (ref 11.5–15.5)
WBC: 8.6 10*3/uL (ref 4.0–10.5)

## 2011-12-15 NOTE — Pre-Procedure Instructions (Signed)
Office closed- left messgae on machine regarding no order in EPIC for blood products 1250

## 2011-12-15 NOTE — Patient Instructions (Signed)
20 Pam Vazquez  12/15/2011   Your procedure is scheduled on:  12/19/11 1030-1300  Tuesday  Report to [facility] at 0800 AM.  Call this number if you have problems the morning of surgery: [phone]   PST [name] [id number]   Remember:             [facility] BEFORE BED Monday NIGHT  Do not eat food:After Midnight. Monday night  May have clear liquids:until Midnight .Monday night  Clear liquids include soda, tea, black coffee, apple or grape juice, broth.  Take these medicines the morning of surgery with A SIP OF WATER: METOPROLOL WITH SIP WATER              NO BLOOD SUGAR MEDICINE AM OF SURGERY  Do not wear jewelry, make-up or nail polish.  Do not wear lotions, powders, or perfumes. You may wear deodorant.  Do not shave 48 hours prior to surgery.  Do not bring valuables to the hospital.  Contacts, dentures or bridgework may not be worn into surgery.  Leave suitcase in the car. After surgery it may be brought to your room.  For patients admitted to the hospital, checkout time is 11:00 AM the day of discharge.   Patients discharged the day of surgery will not be allowed to drive home.  Name and phone number of your driver:husband Special Instructions: CHG Shower Use Special Wash: 1/2 bottle night before surgery and 1/2 bottle morning of surgery.  Regular soap face and privates   Please read over the following fact sheets that you were given: MRSA Information

## 2011-12-15 NOTE — Pre-Procedure Instructions (Signed)
AT PST VISIT- wants to discuss with MD before signing consent- states WANTS TUBES AND OVARIES REMOVED DEFINITELY- "think they said they would" explained that the consent was for salpingooophorectomy, not possible, but if any ? Can sign AM of OR. Called and left VM with Harriett Sine at GYN/ONC to call patient to discuss

## 2011-12-18 NOTE — Pre-Procedure Instructions (Signed)
0830- as per Pam Vazquez- GYN ONCOLOGY- NO TYPE AND SCREEN NEEDED FOR AM OF OR AND IS AWARE OF CHEST X RAY REPORT and wont effect surgery

## 2011-12-19 ENCOUNTER — Encounter (HOSPITAL_COMMUNITY): Payer: Self-pay | Admitting: Anesthesiology

## 2011-12-19 ENCOUNTER — Ambulatory Visit (HOSPITAL_COMMUNITY): Payer: PRIVATE HEALTH INSURANCE | Admitting: Anesthesiology

## 2011-12-19 ENCOUNTER — Encounter (HOSPITAL_COMMUNITY)
Admission: RE | Disposition: A | Discharge status: Home / Self Care | Payer: Self-pay | Source: Ambulatory Visit | Attending: Obstetrics & Gynecology

## 2011-12-19 ENCOUNTER — Other Ambulatory Visit: Payer: Self-pay | Admitting: Gynecologic Oncology

## 2011-12-19 ENCOUNTER — Ambulatory Visit (HOSPITAL_COMMUNITY)
Admission: RE | Admit: 2011-12-19 | Discharge: 2011-12-20 | Disposition: A | Discharge status: Home / Self Care | Payer: PRIVATE HEALTH INSURANCE | Source: Ambulatory Visit | Attending: Obstetrics & Gynecology | Admitting: Obstetrics & Gynecology

## 2011-12-19 ENCOUNTER — Encounter (HOSPITAL_COMMUNITY): Payer: Self-pay | Admitting: *Deleted

## 2011-12-19 DIAGNOSIS — I10 Essential (primary) hypertension: Secondary | ICD-10-CM | POA: Insufficient documentation

## 2011-12-19 DIAGNOSIS — E119 Type 2 diabetes mellitus without complications: Secondary | ICD-10-CM | POA: Insufficient documentation

## 2011-12-19 DIAGNOSIS — Z79899 Other long term (current) drug therapy: Secondary | ICD-10-CM | POA: Insufficient documentation

## 2011-12-19 DIAGNOSIS — C549 Malignant neoplasm of corpus uteri, unspecified: Secondary | ICD-10-CM | POA: Insufficient documentation

## 2011-12-19 DIAGNOSIS — C541 Malignant neoplasm of endometrium: Secondary | ICD-10-CM

## 2011-12-19 DIAGNOSIS — Z01812 Encounter for preprocedural laboratory examination: Secondary | ICD-10-CM | POA: Insufficient documentation

## 2011-12-19 HISTORY — PX: NODE DISSECTION: SHX5269

## 2011-12-19 HISTORY — PX: ROBOTIC ASSISTED TOTAL HYSTERECTOMY WITH BILATERAL SALPINGO OOPHERECTOMY: SHX6086

## 2011-12-19 LAB — GLUCOSE, CAPILLARY
Glucose-Capillary: 130 mg/dL — ABNORMAL HIGH (ref 70–99)
Glucose-Capillary: 189 mg/dL — ABNORMAL HIGH (ref 70–99)
Glucose-Capillary: 170 mg/dL — ABNORMAL HIGH (ref 70–99)
Glucose-Capillary: 206 mg/dL — ABNORMAL HIGH (ref 70–99)

## 2011-12-19 LAB — TYPE AND SCREEN

## 2011-12-19 LAB — ABO/RH: ABO/RH(D): O POS

## 2011-12-19 SURGERY — ROBOTIC ASSISTED TOTAL HYSTERECTOMY WITH BILATERAL SALPINGO OOPHORECTOMY
Anesthesia: General | Site: Uterus | Wound class: Clean Contaminated

## 2011-12-19 MED ORDER — ROCURONIUM BROMIDE 100 MG/10ML IV SOLN
INTRAVENOUS | Status: DC | PRN
  Administered 2011-12-19: 40 mg via INTRAVENOUS
  Administered 2011-12-19: 10 mg via INTRAVENOUS

## 2011-12-19 MED ORDER — SUCCINYLCHOLINE CHLORIDE 20 MG/ML IJ SOLN
INTRAMUSCULAR | Status: DC | PRN
  Administered 2011-12-19: 100 mg via INTRAVENOUS

## 2011-12-19 MED ORDER — HYDROCHLOROTHIAZIDE 25 MG PO TABS
25.0000 mg | ORAL_TABLET | Freq: Every day | ORAL | Status: DC
  Administered 2011-12-20: 25 mg via ORAL
  Filled 2011-12-19: qty 1

## 2011-12-19 MED ORDER — HYDRALAZINE HCL 20 MG/ML IJ SOLN
INTRAMUSCULAR | Status: AC
  Administered 2011-12-19: 5 mg via INTRAVENOUS
  Filled 2011-12-19: qty 1

## 2011-12-19 MED ORDER — MIDAZOLAM HCL 5 MG/5ML IJ SOLN
INTRAMUSCULAR | Status: DC | PRN
  Administered 2011-12-19: 2 mg via INTRAVENOUS

## 2011-12-19 MED ORDER — DEXTROSE 5 % IV SOLN
2.0000 g | INTRAVENOUS | Status: AC
  Administered 2011-12-19: 2 g via INTRAVENOUS
  Filled 2011-12-19: qty 2

## 2011-12-19 MED ORDER — METOPROLOL TARTRATE 25 MG PO TABS
25.0000 mg | ORAL_TABLET | Freq: Two times a day (BID) | ORAL | Status: DC
  Administered 2011-12-19 – 2011-12-20 (×2): 25 mg via ORAL
  Filled 2011-12-19 (×3): qty 1

## 2011-12-19 MED ORDER — ENOXAPARIN SODIUM 40 MG/0.4ML ~~LOC~~ SOLN
40.0000 mg | SUBCUTANEOUS | Status: AC
  Administered 2011-12-19: 40 mg via SUBCUTANEOUS

## 2011-12-19 MED ORDER — PROPOFOL 10 MG/ML IV EMUL
INTRAVENOUS | Status: DC | PRN
  Administered 2011-12-19: 150 mg via INTRAVENOUS

## 2011-12-19 MED ORDER — OXYCODONE-ACETAMINOPHEN 5-325 MG PO TABS
1.0000 | ORAL_TABLET | ORAL | Status: DC | PRN
  Administered 2011-12-20: 1 via ORAL
  Filled 2011-12-19: qty 1

## 2011-12-19 MED ORDER — NEOSTIGMINE METHYLSULFATE 1 MG/ML IJ SOLN
INTRAMUSCULAR | Status: DC | PRN
  Administered 2011-12-19: 3.5 mg via INTRAVENOUS

## 2011-12-19 MED ORDER — SCOPOLAMINE 1 MG/3DAYS TD PT72
MEDICATED_PATCH | TRANSDERMAL | Status: DC | PRN
  Administered 2011-12-19: 1 via TRANSDERMAL

## 2011-12-19 MED ORDER — LABETALOL HCL 5 MG/ML IV SOLN
20.0000 mg | INTRAVENOUS | Status: DC | PRN
  Filled 2011-12-19: qty 4

## 2011-12-19 MED ORDER — SCOPOLAMINE 1 MG/3DAYS TD PT72
MEDICATED_PATCH | TRANSDERMAL | Status: AC
  Filled 2011-12-19: qty 1

## 2011-12-19 MED ORDER — INSULIN ASPART 100 UNIT/ML ~~LOC~~ SOLN
0.0000 [IU] | SUBCUTANEOUS | Status: DC
  Administered 2011-12-19: 2 [IU] via SUBCUTANEOUS
  Administered 2011-12-19: 3 [IU] via SUBCUTANEOUS
  Administered 2011-12-20: 2 [IU] via SUBCUTANEOUS
  Administered 2011-12-20: 3 [IU] via SUBCUTANEOUS
  Administered 2011-12-20: 1 [IU] via SUBCUTANEOUS
  Filled 2011-12-19: qty 3

## 2011-12-19 MED ORDER — DROPERIDOL 2.5 MG/ML IJ SOLN
INTRAMUSCULAR | Status: DC | PRN
  Administered 2011-12-19: 0.625 mg via INTRAVENOUS

## 2011-12-19 MED ORDER — ACETAMINOPHEN 10 MG/ML IV SOLN
INTRAVENOUS | Status: DC | PRN
  Administered 2011-12-19: 1000 mg via INTRAVENOUS

## 2011-12-19 MED ORDER — ZOLPIDEM TARTRATE 5 MG PO TABS: 5.0000 mg | ORAL_TABLET | Freq: Every evening | ORAL | Status: DC | PRN

## 2011-12-19 MED ORDER — LACTATED RINGERS IV SOLN: INTRAVENOUS | Status: DC

## 2011-12-19 MED ORDER — ONDANSETRON HCL 4 MG/2ML IJ SOLN: 4.0000 mg | Freq: Four times a day (QID) | INTRAMUSCULAR | Status: DC | PRN

## 2011-12-19 MED ORDER — HYDRALAZINE HCL 20 MG/ML IJ SOLN
5.0000 mg | Freq: Once | INTRAMUSCULAR | Status: AC
  Administered 2011-12-19: 5 mg via INTRAVENOUS

## 2011-12-19 MED ORDER — HYDROMORPHONE 0.3 MG/ML IV SOLN
INTRAVENOUS | Status: AC
  Filled 2011-12-19: qty 25

## 2011-12-19 MED ORDER — DEXAMETHASONE SODIUM PHOSPHATE 10 MG/ML IJ SOLN
INTRAMUSCULAR | Status: DC | PRN
  Administered 2011-12-19: 10 mg via INTRAVENOUS

## 2011-12-19 MED ORDER — LACTATED RINGERS IV SOLN
INTRAVENOUS | Status: DC | PRN
  Administered 2011-12-19 (×2): via INTRAVENOUS

## 2011-12-19 MED ORDER — LACTATED RINGERS IR SOLN
Status: DC | PRN
  Administered 2011-12-19: 1000 mL

## 2011-12-19 MED ORDER — LISINOPRIL-HYDROCHLOROTHIAZIDE 20-25 MG PO TABS: 1.0000 | ORAL_TABLET | Freq: Every day | ORAL | Status: DC

## 2011-12-19 MED ORDER — HYDROMORPHONE HCL PF 1 MG/ML IJ SOLN: 0.2500 mg | INTRAMUSCULAR | Status: DC | PRN

## 2011-12-19 MED ORDER — ONDANSETRON HCL 4 MG PO TABS: 4.0000 mg | ORAL_TABLET | Freq: Four times a day (QID) | ORAL | Status: DC | PRN

## 2011-12-19 MED ORDER — PROMETHAZINE HCL 25 MG/ML IJ SOLN: 6.2500 mg | INTRAMUSCULAR | Status: DC | PRN

## 2011-12-19 MED ORDER — METFORMIN HCL 500 MG PO TABS
500.0000 mg | ORAL_TABLET | Freq: Every day | ORAL | Status: DC
Start: 2011-12-20 — End: 2011-12-20
  Administered 2011-12-20: 500 mg via ORAL
  Filled 2011-12-19: qty 1

## 2011-12-19 MED ORDER — FENTANYL CITRATE 0.05 MG/ML IJ SOLN
INTRAMUSCULAR | Status: DC | PRN
  Administered 2011-12-19 (×2): 100 ug via INTRAVENOUS
  Administered 2011-12-19: 150 ug via INTRAVENOUS
  Administered 2011-12-19: 50 ug via INTRAVENOUS
  Administered 2011-12-19: 100 ug via INTRAVENOUS

## 2011-12-19 MED ORDER — KCL IN DEXTROSE-NACL 20-5-0.45 MEQ/L-%-% IV SOLN
INTRAVENOUS | Status: DC
  Administered 2011-12-19 – 2011-12-20 (×2): via INTRAVENOUS
  Filled 2011-12-19 (×6): qty 1000

## 2011-12-19 MED ORDER — GLYCOPYRROLATE 0.2 MG/ML IJ SOLN
INTRAMUSCULAR | Status: DC | PRN
  Administered 2011-12-19: .4 mg via INTRAVENOUS

## 2011-12-19 MED ORDER — ONDANSETRON HCL 4 MG/2ML IJ SOLN
INTRAMUSCULAR | Status: DC | PRN
  Administered 2011-12-19: 4 mg via INTRAVENOUS

## 2011-12-19 MED ORDER — LIDOCAINE HCL (CARDIAC) 20 MG/ML IV SOLN
INTRAVENOUS | Status: DC | PRN
  Administered 2011-12-19: 50 mg via INTRAVENOUS

## 2011-12-19 MED ORDER — HYDROMORPHONE HCL PF 1 MG/ML IJ SOLN
INTRAMUSCULAR | Status: DC | PRN
  Administered 2011-12-19 (×2): 1 mg via INTRAVENOUS

## 2011-12-19 MED ORDER — BUPIVACAINE HCL (PF) 0.25 % IJ SOLN
INTRAMUSCULAR | Status: AC
  Filled 2011-12-19: qty 30

## 2011-12-19 MED ORDER — BUPIVACAINE HCL (PF) 0.25 % IJ SOLN
INTRAMUSCULAR | Status: DC | PRN
  Administered 2011-12-19: 8 mL

## 2011-12-19 MED ORDER — LISINOPRIL 20 MG PO TABS
20.0000 mg | ORAL_TABLET | Freq: Every day | ORAL | Status: DC
  Administered 2011-12-20: 20 mg via ORAL
  Filled 2011-12-19: qty 1

## 2011-12-19 MED ORDER — MORPHINE SULFATE 2 MG/ML IJ SOLN
2.0000 mg | INTRAMUSCULAR | Status: DC | PRN
  Administered 2011-12-19: 2 mg via INTRAVENOUS
  Filled 2011-12-19: qty 1

## 2011-12-19 SURGICAL SUPPLY — 48 items
APL SKNCLS STERI-STRIP NONHPOA (GAUZE/BANDAGES/DRESSINGS) ×2
BAG SPEC RTRVL LRG 6X4 10 (ENDOMECHANICALS)
BENZOIN TINCTURE PRP APPL 2/3 (GAUZE/BANDAGES/DRESSINGS) ×3 IMPLANT
CHLORAPREP W/TINT 26ML (MISCELLANEOUS) ×4 IMPLANT
CLOTH BEACON ORANGE TIMEOUT ST (SAFETY) ×3 IMPLANT
CORD HIGH FREQUENCY UNIPOLAR (ELECTROSURGICAL) ×1 IMPLANT
CORDS BIPOLAR (ELECTRODE) ×3 IMPLANT
COVER SURGICAL LIGHT HANDLE (MISCELLANEOUS) ×3 IMPLANT
COVER TIP SHEARS 8 DVNC (MISCELLANEOUS) ×2 IMPLANT
COVER TIP SHEARS 8MM DA VINCI (MISCELLANEOUS) ×1
DECANTER SPIKE VIAL GLASS SM (MISCELLANEOUS) ×2 IMPLANT
DRAPE BACK TABLE (DRAPES) ×2 IMPLANT
DRAPE CAMERA CLOSED 9X96 (DRAPES) ×1 IMPLANT
DRAPE SURG IRRIG POUCH 19X23 (DRAPES) ×3 IMPLANT
DRAPE UTILITY XL STRL (DRAPES) ×3 IMPLANT
DRSG TEGADERM 6X8 (GAUZE/BANDAGES/DRESSINGS) ×6 IMPLANT
ELECT REM PT RETURN 9FT ADLT (ELECTROSURGICAL) ×3
ELECTRODE REM PT RTRN 9FT ADLT (ELECTROSURGICAL) ×2 IMPLANT
GAUZE VASELINE 3X9 (GAUZE/BANDAGES/DRESSINGS) IMPLANT
GLOVE BIO SURGEON STRL SZ 6.5 (GLOVE) ×12 IMPLANT
GLOVE BIO SURGEON STRL SZ7.5 (GLOVE) ×10 IMPLANT
GLOVE BIOGEL PI IND STRL 7.0 (GLOVE) ×4 IMPLANT
GLOVE BIOGEL PI INDICATOR 7.0 (GLOVE) ×2
GOWN PREVENTION PLUS XLARGE (GOWN DISPOSABLE) ×15 IMPLANT
HOLDER FOLEY CATH W/STRAP (MISCELLANEOUS) ×3 IMPLANT
KIT ACCESSORY DA VINCI DISP (KITS) ×1
KIT ACCESSORY DVNC DISP (KITS) ×2 IMPLANT
MANIPULATOR UTERINE 4.5 ZUMI (MISCELLANEOUS) ×3 IMPLANT
OCCLUDER COLPOPNEUMO (BALLOONS) ×3 IMPLANT
PACK MINOR VAGINAL W LONG (CUSTOM PROCEDURE TRAY) ×1 IMPLANT
PACK ROBOTIC CUSTOM GYN (CUSTOM PROCEDURE TRAY) ×2 IMPLANT
POUCH SPECIMEN RETRIEVAL 10MM (ENDOMECHANICALS) ×4 IMPLANT
SET TUBE IRRIG SUCTION NO TIP (IRRIGATION / IRRIGATOR) ×3 IMPLANT
SHEET LAVH (DRAPES) ×1 IMPLANT
SOLUTION ELECTROLUBE (MISCELLANEOUS) ×3 IMPLANT
SPONGE LAP 18X18 X RAY DECT (DISPOSABLE) IMPLANT
STRIP CLOSURE SKIN 1/2X4 (GAUZE/BANDAGES/DRESSINGS) ×3 IMPLANT
SUT VIC AB 0 CT1 27 (SUTURE) ×6
SUT VIC AB 0 CT1 27XBRD ANTBC (SUTURE) ×6 IMPLANT
SUT VIC AB 4-0 PS2 27 (SUTURE) ×6 IMPLANT
SUT VICRYL 0 UR6 27IN ABS (SUTURE) ×5 IMPLANT
SYR BULB IRRIGATION 50ML (SYRINGE) IMPLANT
TRAP SPECIMEN MUCOUS 40CC (MISCELLANEOUS) ×2 IMPLANT
TROCAR 12M 150ML BLUNT (TROCAR) ×1 IMPLANT
TROCAR BLADELESS OPT 5 75 (ENDOMECHANICALS) ×1 IMPLANT
TROCAR XCEL 12X100 BLDLESS (ENDOMECHANICALS) ×1 IMPLANT
TUBING FILTER THERMOFLATOR (ELECTROSURGICAL) ×2 IMPLANT
WATER STERILE IRR 1500ML POUR (IV SOLUTION) ×6 IMPLANT

## 2011-12-19 NOTE — Anesthesia Procedure Notes (Signed)
Procedure Name: Intubation Date/Time: 12/19/2011 10:13 AM Performed by: [place], Indyah Saulnier C Pre-anesthesia Checklist: Patient identified, Timeout performed, Emergency Drugs available, Suction available and Patient being monitored Patient Re-evaluated:Patient Re-evaluated prior to inductionOxygen Delivery Method: Circle System Utilized Preoxygenation: Pre-oxygenation with 100% oxygen Intubation Type: IV induction Ventilation: Mask ventilation without difficulty Laryngoscope Size: Mac and 3 Grade View: Grade I Tube type: Oral Tube size: 7.5 mm Number of attempts: 1 Airway Equipment and Method: stylet Placement Confirmation: ETT inserted through vocal cords under direct vision,  breath sounds checked- equal and bilateral,  positive ETCO2 and CO2 detector Secured at: 21 cm Tube secured with: Tape Dental Injury: Teeth and Oropharynx as per pre-operative assessment

## 2011-12-19 NOTE — Interval H&P Note (Signed)
History and Physical Interval Note:  12/19/2011 9:31 AM  Pam Vazquez  has presented today for surgery, with the diagnosis of Endometrail adenocarcinoma  The various methods of treatment have been discussed with the patient and family. After consideration of risks, benefits and other options for treatment, the patient has consented to  Procedure(s): ROBOTIC ASSISTED TOTAL HYSTERECTOMY WITH BILATERAL SALPINGO OOPHERECTOMY POSSIBLE LYMPH NODE DISSECTION as a surgical intervention .  The patients' history has been reviewed, patient examined, no change in status, stable for surgery.  I have reviewed the patients' chart and labs.  Questions were answered to the patient's satisfaction.     Laurette Schimke MD

## 2011-12-19 NOTE — Anesthesia Postprocedure Evaluation (Signed)
  Anesthesia Post-op Note  Patient: Pam Vazquez  Procedure(s) Performed:  ROBOTIC ASSISTED TOTAL HYSTERECTOMY WITH BILATERAL SALPINGO OOPHERECTOMY - Right pelvic Lymph Node  Patient Location: PACU  Anesthesia Type: General  Level of Consciousness: oriented and sedated  Airway and Oxygen Therapy: Patient Spontanous Breathing and Patient connected to nasal cannula oxygen  Post-op Pain: mild  Post-op Assessment: Post-op Vital signs reviewed, Patient's Cardiovascular Status Stable, Respiratory Function Stable and Patent Airway  Post-op Vital Signs: stable  Complications: No apparent anesthesia complications

## 2011-12-19 NOTE — H&P (View-Only) (Signed)
Consult Note: Gyn-Onc   Pam Vazquez 66 y.o. female  Chief Complaint  Patient presents with  . Endo adenocarcinoma    New pt       HPI: 66 year old white female seen in consultation request of Dr. Duane Lope for management of a newly diagnosed adenocarcinoma of the endometrium. Patient's had postmenopausal bleeding since October 2012 ultimately saw Dr. Tenny Craw who undertook a D&C on 11/21/2011. Final pathology showed a grade 1 endometrial cancer.  Patient has no significant past gynecologic history. She took hormone replacement for a number of years but has not taken any since 2010.  Obstetrical history gravida 2 Currently she is having light vaginal bleeding. She denies any abdominal or pelvic pain. She has no GI or GU symptoms.  No Known Allergies  Past Medical History  Diagnosis Date  . Diabetes mellitus   . Hypertension     Past Surgical History  Procedure Date  . Back surgery     91 and 2001  . Hysteroscopy w/d&c 11/21/2011    Procedure: DILATATION AND CURETTAGE /HYSTEROSCOPY;  Surgeon: Miguel Aschoff;  Location: [facility];  Service: Gynecology;  Laterality: N/A;  . Tubal ligation   . Breast mass removal 1984, 1992    L breast  . Lithotripsy 2000, 2002    Current Outpatient Prescriptions  Medication Sig Dispense Refill  . aspirin 81 MG chewable tablet Chew 81 mg by mouth daily. Is being held prior to surgery       . cholecalciferol (VITAMIN D) 1000 UNITS tablet Take 2,000 Units by mouth daily.        [name] lisinopril-hydrochlorothiazide (PRINZIDE,ZESTORETIC) 20-25 MG per tablet Take 1 tablet by mouth daily.        . Magnesium 250 MG TABS Take 1 tablet by mouth daily.        . metFORMIN (GLUCOPHAGE) 500 MG tablet Take 500 mg by mouth daily with breakfast.        . metoprolol tartrate (LOPRESSOR) 25 MG tablet Take 25 mg by mouth 2 (two) times daily.        . Multiple Vitamins-Minerals (CENTRUM SILVER PO) Take 1 tablet by mouth daily.        . Omega-3 Fatty Acids (FISH OIL) 1200  MG CAPS Take 1 capsule by mouth daily.        . vitamin B-12 (CYANOCOBALAMIN) 500 MCG tablet Take 500 mcg by mouth daily.        . vitamin C (ASCORBIC ACID) 500 MG tablet Take 500 mg by mouth daily.          History   Social History  . Marital Status: Married    Spouse Name: N/A    Number of Children: N/A  . Years of Education: N/A   Occupational History  . Not on file.   Social History Main Topics  . Smoking status: Former Games developer  . Smokeless tobacco: Not on file  . Alcohol Use: No  . Drug Use: No  . Sexually Active: No   Other Topics Concern  . Not on file   Social History Narrative  . No narrative on file    Family History  Problem Relation Age of Onset  . Heart disease Mother   . Heart attack Mother     Review of Systems: 10 point review of systems is negative except as noted above  Vitals: Blood pressure 148/82, pulse 80, temperature 98.3 F (36.8 C), resp. rate 18, height 5' 3.78" (1.62 m), weight 180 lb 3.2 oz (  81.738 kg).  Physical Exam: In general is a healthy white female no acute distress HEENT is negative  Neck is supple without thyromegaly  There is no supraclavicular or inguinal adenopathy  The abdomen is moderately obese soft nontender no masses again a megaly ascites or hernias are noted  Pelvic exam EGBUS vagina bladder urethra are normal.  Cervix is normal  Uterus is anterior and normal shape size and consistency  There are no adnexal masses rectovaginal exam confirms    Assessment/Plan: Grade 1 endometrial carcinoma.  I recommend the patient undergo hysterectomy bilateral salpingo-oophorectomy and possible lymphadenectomy depending upon the eye surgical findings. Pros and cons of open versus robotic surgery were discussed. Patient has elected to proceed with robotic surgery and we'll schedule this for the very near future.  The risks of surgery including hemorrhage infection injury to adjacent viscera thromboembolic complications and  anesthetic risks were all discussed. All of her questions are answered  Surgery is scheduled for 12/19/2011. Patient understands that either Dr. Duard Brady or Dr. Curly Shores will be performing the surgery.  Jeannette Corpus, MD 11/27/2011, 4:03 PM                         Consult Note: Gyn-Onc   Pam Vazquez 66 y.o. female  Chief Complaint  Patient presents with  . Endo adenocarcinoma    New pt    Interval History:   HPI:  No Known Allergies  Past Medical History  Diagnosis Date  . Diabetes mellitus   . Hypertension     Past Surgical History  Procedure Date  . Back surgery     91 and 2001  . Hysteroscopy w/d&c 11/21/2011    Procedure: DILATATION AND CURETTAGE /HYSTEROSCOPY;  Surgeon: Miguel Aschoff;  Location: [facility];  Service: Gynecology;  Laterality: N/A;  . Tubal ligation   . Breast mass removal 1984, 1992    L breast  . Lithotripsy 2000, 2002    Current Outpatient Prescriptions  Medication Sig Dispense Refill  . aspirin 81 MG chewable tablet Chew 81 mg by mouth daily. Is being held prior to surgery       . cholecalciferol (VITAMIN D) 1000 UNITS tablet Take 2,000 Units by mouth daily.        [name] lisinopril-hydrochlorothiazide (PRINZIDE,ZESTORETIC) 20-25 MG per tablet Take 1 tablet by mouth daily.        . Magnesium 250 MG TABS Take 1 tablet by mouth daily.        . metFORMIN (GLUCOPHAGE) 500 MG tablet Take 500 mg by mouth daily with breakfast.        . metoprolol tartrate (LOPRESSOR) 25 MG tablet Take 25 mg by mouth 2 (two) times daily.        . Multiple Vitamins-Minerals (CENTRUM SILVER PO) Take 1 tablet by mouth daily.        . Omega-3 Fatty Acids (FISH OIL) 1200 MG CAPS Take 1 capsule by mouth daily.        . vitamin B-12 (CYANOCOBALAMIN) 500 MCG tablet Take 500 mcg by mouth daily.        . vitamin C (ASCORBIC ACID) 500 MG tablet Take 500 mg by mouth daily.          History   Social History  . Marital Status: Married    Spouse Name: N/A      Number of Children: N/A  . Years of Education: N/A   Occupational History  . Not on file.  Social History Main Topics  . Smoking status: Former Games developer  . Smokeless tobacco: Not on file  . Alcohol Use: No  . Drug Use: No  . Sexually Active: No   Other Topics Concern  . Not on file   Social History Narrative  . No narrative on file    Family History  Problem Relation Age of Onset  . Heart disease Mother   . Heart attack Mother     Review of Systems:  Vitals: Blood pressure 148/82, pulse 80, temperature 98.3 F (36.8 C), resp. rate 18, height 5' 3.78" (1.62 m), weight 180 lb 3.2 oz (81.738 kg).  Physical Exam:  Assessment/Plan:   Jeannette Corpus, MD 11/27/2011, 4:03 PM

## 2011-12-19 NOTE — Anesthesia Preprocedure Evaluation (Signed)
Anesthesia Evaluation  Patient identified by MRN, date of birth, ID band Patient awake    Reviewed: Allergy & Precautions, H&P , NPO status , Patient's Chart, lab work & pertinent test results, reviewed documented beta blocker date and time   History of Anesthesia Complications (+) PONV  Airway Mallampati: II TM Distance: >3 FB Neck ROM: Full    Dental  (+) Teeth Intact and Dental Advisory Given   Pulmonary neg pulmonary ROS,  clear to auscultation        Cardiovascular hypertension, Pt. on medications Regular Normal Pt denies CAD   Neuro/Psych Negative Neurological ROS  Negative Psych ROS   GI/Hepatic negative GI ROS, Neg liver ROS,   Endo/Other  Diabetes mellitus-, Type 2, Oral Hypoglycemic Agents  Renal/GU negative Renal ROS   Endometrial cancer    Musculoskeletal negative musculoskeletal ROS (+)   Abdominal   Peds negative pediatric ROS (+)  Hematology negative hematology ROS (+)   Anesthesia Other Findings Front caps  Reproductive/Obstetrics negative OB ROS                           Anesthesia Physical Anesthesia Plan  ASA: III  Anesthesia Plan: General   Post-op Pain Management:    Induction: Intravenous  Airway Management Planned: Oral ETT  Additional Equipment:   Intra-op Plan:   Post-operative Plan: Extubation in OR  Informed Consent:   Plan Discussed with: CRNA and Surgeon  Anesthesia Plan Comments:         Anesthesia Quick Evaluation

## 2011-12-19 NOTE — Transfer of Care (Signed)
Immediate Anesthesia Transfer of Care Note  Patient: Pam Vazquez  Procedure(s) Performed:  ROBOTIC ASSISTED TOTAL HYSTERECTOMY WITH BILATERAL SALPINGO OOPHERECTOMY - Right pelvic Lymph Node  Patient Location: PACU  Anesthesia Type: General  Level of Consciousness: awake and alert   Airway & Oxygen Therapy: Patient Spontanous Breathing and Patient connected to face mask oxygen  Post-op Assessment: Report given to PACU RN and Post -op Vital signs reviewed and stable  Post vital signs: Reviewed and stable  Complications: No apparent anesthesia complications

## 2011-12-19 NOTE — Op Note (Signed)
Preoperative Diagnosis: Grade 1 endometrial cancer  Postoperative Diagnosis: Stage IA grade 1 endometrial cancer  Procedure(s) Performed: Robotic total laparoscopic hysterectomy,  bilateral salpingo-oophorectomy, right pelvic lymph node dissection  Anesthesia: Gen. endotracheal  Surgeon: [name].  Nelly Rout, M.D. PhD  Assistant Surgeon: Antionette Char M.D.. Assistant: Telford Nab RN, MSN  Specimens: Uterus cervix, ovaries, fallopian tubes, right pelvic lymph nodes  Estimated Blood Loss: Approximately 100 mL.   Complications: None  Indication for Procedure: This is a 65-year-with an endometrial sampling consistent with grade 1 endometrial adenocarcinoma  Operative Findings: 9 cm uterus multiple uterine leiomyomata, normal-appearing adnexa, scarring along the left pelvic sidewall.  Normal appearing appendix.  Rectosigmoid diverticulosis.  Frozen pathology was consistent with minimally invasive endometrial adenocarcinoma  Procedure: Patient was taken to the operating room and placed under general endotracheal anesthesia without any difficulty. She is placed in the dorsal lithotomy position.  Acromioclavicular, and upper extremity padding was placed. Blocks were placed over the shoulders. The patient was prepped and draped and the uterine manipulator placed within the endometrial cavity with much difficulty.. The appropriately sized large Koh ring was circumferentially around the cervix. The balloon was placed within the vagina. An OG tube was present and functional. At an area on the left in line with the nipple approximately 2 cm below the ribs the area was infiltrated with percent Marcaine and a 5 mm Optiview inserted under direct visualization. The abdomen was insufflated to 15 mm of mercury and the pressure never deviated above that throughout the remainder of the procedure. Maximum Trendelenburg positioning was obtained. At approximately 22 cm proximal to the symphysis pubis an  incision was made just superior to the umbilicus. This area was infiltrated with Marcaine as well as the location 10 cm lateral to this incision and 2 cm superior to the left anterior superior iliac spine. Incisions were made. 10 mm trocar was inserted in the superior umbilicus incision.   A perforation of the lower uterine segment posteriorly was recognized with the uterine manipulator balloon visible within the pelvis. The uterine manipulator was removed and reinserted into the correct location of the direct visualization. 8 Millimeter robotic ports were placed in the other 3 incisions. The left upper quadrant port site was replaced with a 10 mm port. This was all completed under direct visualization. The small and large bowel were reflected as much as possible into the upper abdomen. The robot was docked and instruments placed.  The right round ligament was transected and the ureter was identified. The right infundibulopelvic ligament was cauterized and transected The retroperitoneal space was entered on the right and the peritoneum incised to the level of the vesicouterine ligament anteriorly. The bladder flap was created using Bovie cautery. The peritoneal dissection was continued inferiorly and across the inferior most aspect of the cervix. In this manner the urethra was deflected inferiorly. The bladder flap was further developed. The uterine vessels on the right were skeletonized ligated and transected.  The left round ligament was transected and the retroperitoneal space entered. The left ureter was then identified. The left gonadal vessels were cauterized and transected. The broad ligament was skeletonized posteriorly to the level of the cervix and the peritoneum dissected free from the cervix and in this fashion the ureter was deflected inferiorly. The anterior peritoneum was further dissected and the bladder flap appropriately developed. The uterine vessels were skeletonized cauterized and  transected. The balloon within the vagina was then maximally insufflated. A colpotomy incision was made circumferentially and the uterus  was delivered from the vagina. The Koh ring was removed and the balloon was replaced.  Right pelvic lymph node dissection was then initiated. The superior vesicle artery was identified and the vesicouterine space developed. The obturator nerve was identified. Nodal tissue was removed within the boundaries of the right genitofemoral nerve the right circumflex vein, the ureter and the superior vesicle artery.  At that time the frozen section evaluation returned and it was minimally invasive  The nodal tissue was placed in an Endo Catch bag. The specimens were removed through the vagina. The vaginal balloon was reinserted.  The pelvis was copiously irrigated and drained and hemostasis was assured. The vaginal cuff was closed with a running 0 Vicryl suture ligature. The needle was removed under direct visualization. The operative site is once again visualized and hemostasis was assured. The instruments were removed from the abdomen and pelvis and the port sites irrigated. The umbilical fascia was closed with an interrupted suture. The subcutaneous tissue of the umbilical left upper quadrant and right lower quadrant subcutaneous tissues were approximated with a single suture.   The vaginal cuff was small to the moistened sponge stick  Sponge, lap and needle counts were correct x 3.    The patient had sequential compression devices and preoperative Lovenox for VTE prophylaxis and will receive Lovenox postoperatively.          Disposition: PACU - hemodynamically stable.         Condition: Stable Foley draining clear urine.

## 2011-12-20 LAB — BASIC METABOLIC PANEL
Chloride: 104 mEq/L (ref 96–112)
Creatinine, Ser: 0.8 mg/dL (ref 0.50–1.10)
GFR calc Af Amer: 88 mL/min — ABNORMAL LOW (ref >90)
GFR calc non Af Amer: 76 mL/min — ABNORMAL LOW (ref >90)
Potassium: 4.1 mEq/L (ref 3.5–5.1)

## 2011-12-20 LAB — GLUCOSE, CAPILLARY
Glucose-Capillary: 139 mg/dL — ABNORMAL HIGH (ref 70–99)
Glucose-Capillary: 163 mg/dL — ABNORMAL HIGH (ref 70–99)
Glucose-Capillary: 232 mg/dL — ABNORMAL HIGH (ref 70–99)

## 2011-12-20 LAB — CBC
MCV: 91.3 fL (ref 78.0–100.0)
Platelets: 227 10*3/uL (ref 150–400)
RDW: 13.6 % (ref 11.5–15.5)
WBC: 10.5 10*3/uL (ref 4.0–10.5)

## 2011-12-20 MED ORDER — OXYCODONE-ACETAMINOPHEN 5-325 MG PO TABS: 1.0000 | ORAL_TABLET | Freq: Four times a day (QID) | ORAL | Status: AC | PRN

## 2011-12-20 NOTE — Progress Notes (Signed)
Discharge summary and new prescription verbalized to patient and daughter.  Patient verbalized understanding of discharge instructions and follow-up contacts.  Patient instructed to contact physician with any questions or concerns. Co-signed by JBM, RN-BC,BSN.

## 2011-12-20 NOTE — Discharge Summary (Signed)
Physician Discharge Summary  Patient ID: Pam Vazquez MRN: [id number] DOB/AGE: 05-18-1946 66 y.o.  Admit date: 12/19/2011 Discharge date: 12/20/2011  Admission Diagnoses:  Endometrial cancer  Discharge Diagnoses:  Endometrial cancer  Discharged Condition: good  Hospital Course:   The patient underwent a TRH/PND.  The patient's postoperative course was uneventful.  She was discharged to home on postoperative day #1 tolerating a regular diet.  Consults: None  Significant Diagnostic Studies: none  Treatments: surgery: see above  Discharge Exam: Blood pressure 122/70, pulse 63, temperature 98.9 F (37.2 C), temperature source Oral, resp. rate 16, height 5\' 1"  (1.549 m), weight 81.647 kg (180 lb), SpO2 98.00%. 02/05 0701 - 02/06 0700 In: 4250 [I.V.:4250] Out: 3390 [Urine:3350; Blood:40] Results for orders placed during the hospital encounter of 12/19/11 (from the past 24 hour(s))  GLUCOSE, CAPILLARY     Status: Abnormal   Collection Time   12/19/11  8:38 AM      Component Value Range   Glucose-Capillary 130 (*) 70 - 99 (mg/dL)   Comment 1 Documented in Chart    TYPE AND SCREEN     Status: Normal   Collection Time   12/19/11  9:45 AM      Component Value Range   ABO/RH(D) O POS     Antibody Screen NEG     Sample Expiration 12/22/2011    ABO/RH     Status: Normal   Collection Time   12/19/11 10:00 AM      Component Value Range   ABO/RH(D) O POS    GLUCOSE, CAPILLARY     Status: Abnormal   Collection Time   12/19/11  1:38 PM      Component Value Range   Glucose-Capillary 189 (*) 70 - 99 (mg/dL)  GLUCOSE, CAPILLARY     Status: Abnormal   Collection Time   12/19/11  5:08 PM      Component Value Range   Glucose-Capillary 170 (*) 70 - 99 (mg/dL)  GLUCOSE, CAPILLARY     Status: Abnormal   Collection Time   12/19/11  8:07 PM      Component Value Range   Glucose-Capillary 206 (*) 70 - 99 (mg/dL)  GLUCOSE, CAPILLARY     Status: Abnormal   Collection Time   12/19/11 11:54 PM   Component Value Range   Glucose-Capillary 232 (*) 70 - 99 (mg/dL)  CBC     Status: Abnormal   Collection Time   12/20/11  3:34 AM      Component Value Range   WBC 10.5  4.0 - 10.5 (K/uL)   RBC 3.91  3.87 - 5.11 (MIL/uL)   Hemoglobin 11.9 (*) 12.0 - 15.0 (g/dL)   HCT 10.2 (*) 72.5 - 46.0 (%)   MCV 91.3  78.0 - 100.0 (fL)   MCH 30.4  26.0 - 34.0 (pg)   MCHC 33.3  30.0 - 36.0 (g/dL)   RDW 36.6  44.0 - 34.7 (%)   Platelets 227  150 - 400 (K/uL)  BASIC METABOLIC PANEL     Status: Abnormal   Collection Time   12/20/11  3:34 AM      Component Value Range   Sodium 141  135 - 145 (mEq/L)   Potassium 4.1  3.5 - 5.1 (mEq/L)   Chloride 104  96 - 112 (mEq/L)   CO2 27  19 - 32 (mEq/L)   Glucose, Bld 178 (*) 70 - 99 (mg/dL)   BUN 10  6 - 23 (mg/dL)   Creatinine, Ser  0.80  0.50 - 1.10 (mg/dL)   Calcium 9.5  8.4 - 16.1 (mg/dL)   GFR calc non Af Amer 76 (*) >90 (mL/min)   GFR calc Af Amer 88 (*) >90 (mL/min)  GLUCOSE, CAPILLARY     Status: Abnormal   Collection Time   12/20/11  4:08 AM      Component Value Range   Glucose-Capillary 163 (*) 70 - 99 (mg/dL)   General appearance: alert GI: soft, non-tender; bowel sounds normal; no masses,  no organomegaly Extremities: extremities normal, atraumatic, no cyanosis or edema Incision/Wound:  C/D/I  Disposition: Home or Self Care  Discharge Orders    Future Orders Please Complete By Expires   Diet Carb Modified      Activity as tolerated - No restrictions      Increase activity slowly      May walk up steps      May shower / Bathe      Comments:   No tub baths for 6 weeks   Driving Restrictions      Comments:   No driving for 1- 2 weeks   Lifting restrictions      Comments:   No lifting > 30 lbs for 6 weeks   Sexual Activity Restrictions      Comments:   No intercourse for 6 - 8 weeks   Discharge wound care:      Comments:   Keep clean and dry   Call MD for:  temperature >100.4      Call MD for:  persistant nausea and vomiting       Call MD for:  severe uncontrolled pain      Call MD for:  redness, tenderness, or signs of infection (pain, swelling, redness, odor or green/yellow discharge around incision site)      Call MD for:  persistant dizziness or light-headedness      Call MD for:  extreme fatigue        Medication List  As of 12/20/2011  8:12 AM   TAKE these medications         aspirin 81 MG chewable tablet   Chew 81 mg by mouth daily with breakfast.      CENTRUM SILVER PO   Take 1 tablet by mouth daily.      cholecalciferol 1000 UNITS tablet   Commonly known as: VITAMIN D   Take 2,000 Units by mouth daily.      Fish Oil 1200 MG Caps   Take 1 capsule by mouth daily.      lisinopril-hydrochlorothiazide 20-25 MG per tablet   Commonly known as: PRINZIDE,ZESTORETIC   Take 1 tablet by mouth daily after breakfast.      Magnesium 250 MG Tabs   Take 1 tablet by mouth at bedtime.      metFORMIN 500 MG tablet   Commonly known as: GLUCOPHAGE   Take 500 mg by mouth daily with breakfast.      metoprolol tartrate 25 MG tablet   Commonly known as: LOPRESSOR   Take 25 mg by mouth 2 (two) times daily.      oxyCODONE-acetaminophen 5-325 MG per tablet   Commonly known as: PERCOCET   Take 1-2 tablets by mouth every 6 (six) hours as needed (moderate to severe pain (when tolerating fluids)).      vitamin B-12 500 MCG tablet   Commonly known as: CYANOCOBALAMIN   Take 500 mcg by mouth daily.      vitamin C 500 MG tablet  Commonly known as: ASCORBIC ACID   Take 500 mg by mouth daily.           Follow-up Information    Please follow up. (Call Telford Nab RN)          Signed: Antionette Char A 12/20/2011, 8:12 AM

## 2011-12-22 ENCOUNTER — Telehealth: Payer: Self-pay | Admitting: *Deleted

## 2011-12-22 NOTE — Telephone Encounter (Signed)
Patient notified of Path results.  F/U appt 02/08/12 @1300 .

## 2012-01-08 ENCOUNTER — Telehealth: Payer: Self-pay | Admitting: *Deleted

## 2012-01-08 NOTE — Telephone Encounter (Signed)
Returning  message left about questions.  Reassured about normal post operative findings - scant vaginal discharge, mild RLQ pain.  Advised that vaginal discharge normal.  Recommend over the counter pain reliever for mild RLQ pain.  Pt reports normal bowel and bladder function.  Feeling well and better each day.  Instructed to call for any further concerns, questions, or worsening in symptoms.  RTC 02/08/12 as scheduled or call for sooner appointment.

## 2012-01-11 ENCOUNTER — Encounter (HOSPITAL_COMMUNITY): Payer: Self-pay | Admitting: Gynecologic Oncology

## 2012-02-06 ENCOUNTER — Encounter: Payer: Self-pay | Admitting: Gynecologic Oncology

## 2012-02-06 ENCOUNTER — Ambulatory Visit: Payer: PRIVATE HEALTH INSURANCE | Attending: Gynecologic Oncology | Admitting: Gynecologic Oncology

## 2012-02-06 VITALS — BP 142/80 | HR 66 | Temp 98.0°F | Resp 16 | Ht 60.79 in | Wt 180.8 lb

## 2012-02-06 DIAGNOSIS — Z8542 Personal history of malignant neoplasm of other parts of uterus: Secondary | ICD-10-CM | POA: Insufficient documentation

## 2012-02-06 DIAGNOSIS — C541 Malignant neoplasm of endometrium: Secondary | ICD-10-CM

## 2012-02-06 DIAGNOSIS — Z90710 Acquired absence of both cervix and uterus: Secondary | ICD-10-CM | POA: Insufficient documentation

## 2012-02-06 DIAGNOSIS — Z09 Encounter for follow-up examination after completed treatment for conditions other than malignant neoplasm: Secondary | ICD-10-CM | POA: Insufficient documentation

## 2012-02-06 NOTE — Patient Instructions (Signed)
Followup with Dr.[name] In 6 months. Followup with GYN oncology in one year.

## 2012-02-06 NOTE — Progress Notes (Signed)
HPI:  Pam Vazquez is a 66 y.o. year old No obstetric history on file. initially seen in consultation on 11/27/2011 at the request of Dr. Duane Lope for evaluation of a grade 1 endometrial adenocarcinoma..  She then underwent a robotic-assisted total laparoscopic hysterectomy bilateral salpingo-oophorectomy right pelvic lymph node biopsy on 12/19/2011 without complications.  Her postoperative course was unremarkable.   Diagnosis 1. Uterus, ovaries and fallopian tubes, with cervix - INVASIVE ENDOMETRIOID CARCINOMA, CONFINED WITHIN THE INNER HALF OF THE MYOMETRIUM. - NO EVIDENCE OF ANGIOLYMPHATIC INVASION IDENTIFIED. - NO CERVICAL STROMAL INVOLVEMENT IDENTIFIED. - TWO BENIGN ENDOMETRIAL POLYPS WITH NO EVIDENCE OF HYPERPLASIA OR MALIGNANCY. - MYOMETRIUM: LEIOMYOMATA AND ADENOMYOSIS. - CERVIX: BENIGN SQUAMOUS MUCOSA AND ENDOCERVICAL MUCOSA, NO DYSPLASIA OR MALIGNANCY. - BILATERAL OVARIES AND FALLOPIAN TUBES: NO PATHOLOGIC ABNORMALITIES. 2. Lymph nodes, regional resection, right pelvic - THREE LYMPH NODES, NEGATIVE FOR METASTATIC CARCINOMA (0/3). Microscopic Comment 1. UTERUS Specimen: Uterus, cervix, bilateral ovaries and fallopian tubes Procedure: Total hysterectomy and bilateral salpingo-oophorectomy Lymph node sampling performed: Yes Specimen integrity: Intact Maximum tumor size (cm): 1.2 cm, gross measurement Histologic type: Invasive endometrioid carcinoma Grade: FIGO grade I Myometrial invasion: 0.5 cm where myometrium is 1.6 cm in thickness Cervical stromal involvement: No Extent of involvement of other organs: No Lymph vascular invasion: Not identified Peritoneal washings: N/A Lymph nodes: number examined 3; number positive 0  Her final pathologic diagnosis is a Stage Ia Grade 1 endometrioid endometrial cancer with no evidence of lymphovascular space invasion, 5/16 mm (31 %) of myometrial invasion and negative lymph nodes.  She is seen today for a postoperative check and to  discuss her pathology results and treatment plan.  Since discharge from the hospital, she is feeling well.  She has improving appetite, normal bowel and bladder function, and pain controlled with minimal PO medication. She has no other complaints today.    Review of systems: Constitutional:  She has no weight gain or weight loss. She has no fever or chills. Eyes: No blurred vision Ears, Nose, Mouth, Throat: No dizziness, headaches or changes in hearing. No mouth sores. Cardiovascular: No chest pain, palpitations or edema. Respiratory:  No shortness of breath, wheezing or cough Gastrointestinal: She has normal bowel movements without diarrhea or constipation. She denies any nausea or vomiting. She denies blood in her stool or heart burn. Genitourinary:  She denies pelvic pain, pelvic pressure or changes in her urinary function. She has no hematuria, dysuria, or incontinence. She has no irregular vaginal bleeding or vaginal discharge Musculoskeletal: Denies muscle weakness or joint pains.  Skin:  She has no skin changes, rashes or itching Neurological:  Denies dizziness or headaches. No neuropathy, no numbness or tingling. Psychiatric:  She denies depression or anxiety. Endocrine:  No DM or thyroid disease Hematologic/Lymphatic:   No easy bruising or bleeding   Physical Exam:  General: Well dressed, well nourished in no apparent distress.   HEENT:  Normocephalic and atraumatic, no lesions.  Extraocular muscles intact. Sclerae anicteric. Pupils equal, round, reactive. No mouth sores or ulcers. Thyroid is normal size, not nodular, midline. Skin:  No lesions or rashes. Breasts:  Soft, symmetric.  No skin or nipple changes.  No palpable LN or masses. Lungs:  Clear to auscultation bilaterally.  No wheezes. Cardiovascular:  Regular rate and rhythm.  No murmurs or rubs. Abdomen:  Soft, nontender, nondistended.  No palpable masses.  No hepatosplenomegaly.  No ascites. Normal bowel sounds.  No  hernias or tenderness at the laparoscopic trocar sites. Genitourinary: Normal EGBUS  Vaginal cuff intact.  No bleeding or discharge.  No cul de sac fullness. Extremities: No cyanosis, clubbing or edema.  No calf tenderness or erythema. No palpable cords. Lymphatics:  No palpable supraclavicular, cervical, axillary or inguinal lymphadenopathy. Psychiatric: Mood and affect are appropriate. Neurological: Awake, alert and oriented x 3. Sensation is intact, no neuropathy.  Musculoskeletal: No pain, normal strength and range of motion.  Assessment:   57) 66 y.o. 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  Plan: Pathology reports reviewed today Treatment counseling - Very low risk (<5%) for recurrence given age, grade, depth of myometrial invasion and LVSI status.  Discussed signs and symptoms of recurrence.  We will start with visits every 6 months x 2 years, then every 12 months for 3 more years.   Annual pap test.  Followup with Dr.[name] In 6 months. Followup with GYN oncology in one year.  She was given the opportunity to ask questions, which were answered to her satisfaction, and she is agreement with the above mentioned plan of care.

## 2012-02-08 ENCOUNTER — Ambulatory Visit: Payer: PRIVATE HEALTH INSURANCE | Admitting: Gynecologic Oncology

## 2012-08-22 ENCOUNTER — Other Ambulatory Visit: Payer: Self-pay | Admitting: Gynecology

## 2013-02-06 ENCOUNTER — Encounter: Payer: Self-pay | Admitting: Gynecologic Oncology

## 2013-02-06 ENCOUNTER — Ambulatory Visit: Payer: Medicare Other | Attending: Gynecologic Oncology | Admitting: Gynecologic Oncology

## 2013-02-06 VITALS — BP 138/80 | HR 64 | Temp 98.3°F | Resp 18 | Ht 60.79 in | Wt 173.3 lb

## 2013-02-06 DIAGNOSIS — C549 Malignant neoplasm of corpus uteri, unspecified: Secondary | ICD-10-CM | POA: Insufficient documentation

## 2013-02-06 DIAGNOSIS — A58 Granuloma inguinale: Secondary | ICD-10-CM | POA: Insufficient documentation

## 2013-02-06 DIAGNOSIS — C541 Malignant neoplasm of endometrium: Secondary | ICD-10-CM

## 2013-02-06 NOTE — Patient Instructions (Addendum)
Followup with Dr.[name] In 12 months. Followup with GYN oncology in 6 months  Thank you very much Ms. Pam Vazquez for allowing me to provide care for you today.  I appreciate your confidence in choosing our Gynecologic Oncology team.  If you have any questions about your visit today please call our office and we will get back to you as soon as possible.  Pam Vazquez. Pam Odeh MD., PhD Gynecologic Oncology

## 2013-02-06 NOTE — Progress Notes (Signed)
HPI:  Pam Vazquez is a 67 y.o. year old No obstetric history on file. initially seen in consultation on 11/27/2011 at the request of Dr. Duane Lope for evaluation of a grade 1 endometrial adenocarcinoma..  She then underwent a robotic-assisted total laparoscopic hysterectomy bilateral salpingo-oophorectomy right pelvic lymph node biopsy on 12/19/2011 without complications.     1. UTERUS Specimen: Uterus, cervix, bilateral ovaries and fallopian tubes Procedure: Total hysterectomy and bilateral salpingo-oophorectomy Lymph node sampling performed: Yes Specimen integrity: Intact Maximum tumor size (cm): 1.2 cm, gross measurement Histologic type: Invasive endometrioid carcinoma Grade: FIGO grade I Myometrial invasion: 0.5 cm where myometrium is 1.6 cm in thickness Cervical stromal involvement: No Extent of involvement of other organs: No Lymph vascular invasion: Not identified Peritoneal washings: N/A Lymph nodes: number examined 3; number positive 0  Her final pathologic diagnosis is a Stage Ia Grade 1 endometrioid endometrial cancer with no evidence of lymphovascular space invasion, 5/16 mm (31 %) of myometrial invasion and negative lymph nodes.  Pam Vazquez   presented to Dr. Lodema Hong in October of 2013. A Pap test at that time was within normal limits and granulation tissue was removed from the cuff. Subsequent visits to Dr. Greta Doom in November December and January were all notable for the application of silver nitrate for management of the persistent vaginal cuff granulation tissue. At this visit she denies bleeding. She reports intermittent mucus-like discharge  Past Medical History  Diagnosis Date  . Diabetes mellitus   . PONV (postoperative nausea and vomiting)   . Cancer     endometrial  . Angina     stress test on chart from 3/12/ none since March 2012  . Chronic kidney disease     kidney stones  . Hypertension     / EKG 1/13 Epic   Past Surgical History  Procedure  Laterality Date  . Hysteroscopy w/d&c  11/21/2011    Procedure: DILATATION AND CURETTAGE /HYSTEROSCOPY;  Surgeon: Miguel Aschoff;  Location: [facility];  Service: Gynecology;  Laterality: N/A;  . Tubal ligation    . Breast mass removal  1984, 1992    L breast  . Lithotripsy  2000, 2002  . Back surgery      91 and 2001  . Dilation and curettage of uterus    . Node dissection  12/19/2011    Procedure: NODE DISSECTION;  Surgeon: Laurette Schimke, MD PHD;  Location: [facility];  Service: Gynecology;  Laterality: N/A;   Social Hx.    her husband died a few months ago. She states that she is adjusting. Reports decreased appetite Review of systems: Constitutional:  She has no weight gain or weight loss. She has no fever or chills. Eyes: No blurred vision Ears, Nose, Mouth, Throat: No dizziness, headaches or changes in hearing. No mouth sores. Cardiovascular: No chest pain, palpitations or edema. Respiratory:  No shortness of breath, wheezing or cough Gastrointestinal: She has normal bowel movements without diarrhea or constipation. She denies any nausea or vomiting. She denies blood in her stool or heart burn. Genitourinary:  She denies pelvic pain, pelvic pressure or changes in her urinary function. She has no hematuria, dysuria, or incontinence. She has no vaginal bleeding, reports occasional mucus-like vaginal discharge Musculoskeletal: Denies muscle weakness or joint pains.  Skin:  She has no skin changes, rashes or itching Neurological:  Denies dizziness or headaches. No neuropathy, no numbness or tingling. Psychiatric:  She denies depression or anxiety. Endocrine:  No DM or thyroid disease Hematologic/Lymphatic:  No easy bruising or bleeding   Physical Exam:  General: Well dressed, well nourished in no apparent distress.   HEENT:  Normocephalic and atraumatic, no lesions.  Extraocular muscles intact. Sclerae anicteric. Pupils equal, round, reactive. No mouth sores or ulcers. Thyroid is normal size,  not nodular, midline. Skin:  No lesions or rashes. Breasts:  Soft, symmetric.  No skin or nipple changes.  No palpable LN or masses. Lungs:  Clear to auscultation bilaterally.  No wheezes. Cardiovascular:  Regular rate and rhythm.  No murmurs or rubs. Abdomen:  Soft, nontender, nondistended.  No palpable masses.  No hepatosplenomegaly.  No ascites. Normal bowel sounds.  No hernias or tenderness at the laparoscopic trocar sites. Genitourinary: Normal EGBUS  Vaginal cuff intact.  No bleeding or discharge.  No cul de sac fullness.  granulation-like tissue is present at the vaginal apex. This was removed and silver nitrate was placed over the denuded area. Hemostasis was achieved  Extremities: No cyanosis, clubbing or edema.  No calf tenderness or erythema. No palpable cords. Lymphatics:  No palpable supraclavicular, cervical, axillary or inguinal lymphadenopathy. Psychiatric: Mood and affect are appropriate. Neurological: Awake, alert and oriented x 3. Sensation is intact, no neuropathy.  Musculoskeletal: No pain, normal strength and range of motion.  Assessment:   62) 66 y.o. 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  Her postop course has been complicated by persistent granulation tissue.  Plan: The granulation tissue was removed and silver nitrate applied. Patient's been asked to followup if there is persistent vaginal discharge. It may require either cautery ablation of the vaginal cuff for revision of the vaginal cuff to permanently remove the granulation tissue. Facet of followup in 6 months to reassess whether any intervention is necessary.  Followup with Dr.[name] In 12 months. Followup with GYN oncology in 6 months

## 2013-02-19 ENCOUNTER — Telehealth: Payer: Self-pay | Admitting: *Deleted

## 2013-02-19 NOTE — Telephone Encounter (Signed)
Patient informed of biopsy results

## 2013-04-29 ENCOUNTER — Encounter: Payer: Self-pay | Admitting: Gynecologic Oncology

## 2013-04-29 ENCOUNTER — Other Ambulatory Visit: Payer: Medicare Other

## 2013-04-29 ENCOUNTER — Ambulatory Visit: Payer: Medicare Other | Attending: Gynecologic Oncology | Admitting: Gynecologic Oncology

## 2013-04-29 VITALS — BP 128/74 | HR 62 | Temp 98.4°F | Resp 16 | Ht 60.79 in | Wt 164.1 lb

## 2013-04-29 DIAGNOSIS — I10 Essential (primary) hypertension: Secondary | ICD-10-CM | POA: Insufficient documentation

## 2013-04-29 DIAGNOSIS — Z9079 Acquired absence of other genital organ(s): Secondary | ICD-10-CM | POA: Insufficient documentation

## 2013-04-29 DIAGNOSIS — N949 Unspecified condition associated with female genital organs and menstrual cycle: Secondary | ICD-10-CM | POA: Insufficient documentation

## 2013-04-29 DIAGNOSIS — Z923 Personal history of irradiation: Secondary | ICD-10-CM | POA: Insufficient documentation

## 2013-04-29 DIAGNOSIS — C541 Malignant neoplasm of endometrium: Secondary | ICD-10-CM

## 2013-04-29 DIAGNOSIS — E119 Type 2 diabetes mellitus without complications: Secondary | ICD-10-CM | POA: Insufficient documentation

## 2013-04-29 DIAGNOSIS — Z90710 Acquired absence of both cervix and uterus: Secondary | ICD-10-CM | POA: Insufficient documentation

## 2013-04-29 DIAGNOSIS — C549 Malignant neoplasm of corpus uteri, unspecified: Secondary | ICD-10-CM | POA: Insufficient documentation

## 2013-04-29 DIAGNOSIS — N760 Acute vaginitis: Secondary | ICD-10-CM | POA: Insufficient documentation

## 2013-04-29 LAB — BASIC METABOLIC PANEL (CC13)
BUN: 18.3 mg/dL (ref 7.0–26.0)
Calcium: 11.1 mg/dL — ABNORMAL HIGH (ref 8.4–10.4)
Creatinine: 0.9 mg/dL (ref 0.6–1.1)

## 2013-04-29 NOTE — Progress Notes (Signed)
Assessment:   40) 67 y.o. 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  Her postop course has been complicated by persistent granulation tissue. At this visit she reports pelvic crampy pain is consistent over the last few weeks with radiation to the vagina. Vaginal examination is notable for a nodule in the vagina this was biopsied. A rectal examination firm 8 cm mass was noted.  The nodule was sent to pathology we'll followup with [name] regarding the biopsy results. A CT scan of the abdomen and pelvis has been ordered to further evaluate the sigmoid mass. [name] reports a history of diverticular disease. She's been counseled that this may represent diverticular disease versus recurrence.  Followup with Dr.[name] as scheduled Followup with GYN oncology in 1 month    HPI:  Pam Vazquez is a 67 y.o. year old No obstetric history on file. initially seen in consultation on 11/27/2011 at the request of Dr. Duane Lope for evaluation of a grade 1 endometrial adenocarcinoma..  She then underwent a robotic-assisted total laparoscopic hysterectomy bilateral salpingo-oophorectomy right pelvic lymph node biopsy on 12/19/2011 without complications.     1. UTERUS Specimen: Uterus, cervix, bilateral ovaries and fallopian tubes Procedure: Total hysterectomy and bilateral salpingo-oophorectomy Lymph node sampling performed: Yes Specimen integrity: Intact Maximum tumor size (cm): 1.2 cm, gross measurement Histologic type: Invasive endometrioid carcinoma Grade: FIGO grade I Myometrial invasion: 0.5 cm where myometrium is 1.6 cm in thickness Cervical stromal involvement: No Extent of involvement of other organs: No Lymph vascular invasion: Not identified Peritoneal washings: N/A Lymph nodes: number examined 3; number positive 0  Her final pathologic diagnosis is a Stage Ia Grade 1 endometrioid endometrial cancer with no evidence of lymphovascular  space invasion, 5/16 mm (31 %) of myometrial invasion and negative lymph nodes.  Pam Vazquez   presented to Dr. Lodema Hong in October of 2013. A Pap test at that time was within normal limits and granulation tissue was removed from the cuff. Subsequent visits to Dr. Greta Doom in November December and January were all notable for the application of silver nitrate for management of the persistent vaginal cuff granulation tissue. At this visit she denies bleeding. She reports lower abdominal/pelvic pain with radiation to the vulva.   Past Medical History  Diagnosis Date  . Diabetes mellitus   . PONV (postoperative nausea and vomiting)   . Cancer     endometrial  . Angina     stress test on chart from 3/12/ none since March 2012  . Chronic kidney disease     kidney stones  . Hypertension     / EKG 1/13 Epic   Past Surgical History  Procedure Laterality Date  . Hysteroscopy w/d&c  11/21/2011    Procedure: DILATATION AND CURETTAGE /HYSTEROSCOPY;  Surgeon: Miguel Aschoff;  Location: [facility];  Service: Gynecology;  Laterality: N/A;  . Tubal ligation    . Breast mass removal  1984, 1992    L breast  . Lithotripsy  2000, 2002  . Back surgery      91 and 2001  . Dilation and curettage of uterus    . Node dissection  12/19/2011    Procedure: NODE DISSECTION;  Surgeon: Laurette Schimke, MD PHD;  Location: [facility];  Service: Gynecology;  Laterality: N/A;   Social Hx.    her husband died a few months ago. She states that she is adjusting. Reports decreased appetite  Review of systems: Constitutional:  She  has no weight gain or weight loss. She has no fever or chills. Eyes: No blurred vision Ears, Nose, Mouth, Throat: No dizziness, headaches or changes in hearing. No mouth sores. Cardiovascular: No chest pain, palpitations or edema. Respiratory:  No shortness of breath, wheezing or cough Gastrointestinal: She has normal bowel movements without diarrhea or constipation. She denies any nausea or vomiting.  She denies blood in her stool or heart burn. She does report persistent vague lower pelvic pain that radiates to the vagina.  The pain is relieved with use of a nonsteroidal Genitourinary:  She denies pelvic pain, pelvic pressure or changes in her urinary function. She has no hematuria, dysuria, or incontinence. She has no vaginal bleeding, reports occasional mucus-like vaginal discharge Musculoskeletal: Denies muscle weakness or joint pains.  Skin:  She has no skin changes, rashes or itching Neurological:  Denies dizziness or headaches. No neuropathy, no numbness or tingling. Psychiatric:  She denies depression or anxiety. Endocrine:  No DM or thyroid disease Hematologic/Lymphatic:   No easy bruising or bleeding   Physical Exam:  General: Well dressed, well nourished in no apparent distress.   HEENT:  Normocephalic and atraumatic, no lesions.  Extraocular muscles intact. Sclerae anicteric. Pupils equal, round, reactive. No mouth sores or ulcers. Thyroid is normal size, not nodular, midline. Skin:  No lesions or rashes. Breasts:  Soft, symmetric.  No skin or nipple changes.  No palpable LN or masses. Lungs:  Clear to auscultation bilaterally.  No wheezes. Cardiovascular:  Regular rate and rhythm.  No murmurs or rubs. Abdomen:  Soft, nontender, nondistended.  No palpable masses.  No hepatosplenomegaly.  No ascites. Normal bowel sounds.  No hernias or tenderness at the laparoscopic trocar sites. Genitourinary: Normal EGBUS  Vaginal cuff intact.  No bleeding or discharge.  No cul de sac fullness.  Nodule was present at the vaginal apex, this was biopsied bleeding was managed with application of silver nitrate. There was firmness at the vaginal apex with no discrete masses. Rectal: Centimeter mass palpable within the cul-de-sac is unclear if this represented edematous rectosigmoid or metastatic disease. There is tenderness to palpation of this mass  Extremities: No cyanosis, clubbing or edema.  No  calf tenderness or erythema. No palpable cords. Lymphatics:  No palpable supraclavicular, cervical, axillary or inguinal lymphadenopathy. Psychiatric: Mood and affect are appropriate. Neurological: Awake, alert and oriented x 3. Sensation is intact, no neuropathy.  Musculoskeletal: No pain, normal strength and range of motion.

## 2013-04-30 ENCOUNTER — Telehealth: Payer: Self-pay | Admitting: Gynecologic Oncology

## 2013-04-30 NOTE — Telephone Encounter (Signed)
Spoke with the patient about elevated Ca+ level of 11.1.  She is advised to follow up with her primary care provider in the am.  It is noted that she is taking HCTZ daily.  Lab results and office note routed to Dr. Margo Common.  Patient instructed to call for any needs.  Signs and symptoms of hypercalcemia reviewed.  No concerns voiced.

## 2013-05-01 ENCOUNTER — Telehealth: Payer: Self-pay | Admitting: Gynecologic Oncology

## 2013-05-01 NOTE — Telephone Encounter (Signed)
Patient informed of final pathology results of her vaginal cuff biopsy.  No concerns voiced.  Instructed to call for any needs.

## 2013-05-05 ENCOUNTER — Other Ambulatory Visit: Payer: Self-pay | Admitting: Gynecologic Oncology

## 2013-05-05 ENCOUNTER — Telehealth: Payer: Self-pay | Admitting: Gynecologic Oncology

## 2013-05-05 ENCOUNTER — Ambulatory Visit (HOSPITAL_COMMUNITY)
Admission: RE | Admit: 2013-05-05 | Discharge: 2013-05-05 | Disposition: A | Discharge status: Home / Self Care | Payer: Medicare Other | Source: Ambulatory Visit | Attending: Gynecologic Oncology | Admitting: Gynecologic Oncology

## 2013-05-05 DIAGNOSIS — L049 Acute lymphadenitis, unspecified: Secondary | ICD-10-CM | POA: Insufficient documentation

## 2013-05-05 DIAGNOSIS — Z90710 Acquired absence of both cervix and uterus: Secondary | ICD-10-CM | POA: Insufficient documentation

## 2013-05-05 DIAGNOSIS — Z981 Arthrodesis status: Secondary | ICD-10-CM | POA: Insufficient documentation

## 2013-05-05 DIAGNOSIS — C549 Malignant neoplasm of corpus uteri, unspecified: Secondary | ICD-10-CM | POA: Insufficient documentation

## 2013-05-05 DIAGNOSIS — N9489 Other specified conditions associated with female genital organs and menstrual cycle: Secondary | ICD-10-CM | POA: Insufficient documentation

## 2013-05-05 DIAGNOSIS — C541 Malignant neoplasm of endometrium: Secondary | ICD-10-CM

## 2013-05-05 MED ORDER — IOHEXOL 300 MG/ML  SOLN
100.0000 mL | Freq: Once | INTRAMUSCULAR | Status: AC | PRN
  Administered 2013-05-05: 100 mL via INTRAVENOUS

## 2013-05-05 NOTE — Telephone Encounter (Signed)
Patient informed of upcoming appointment with Dr. Welton Flakes tomorrow at 11:30am.  Instructed to arrive around 10:30 to [facility] to have a chest xray per Dr. Nelly Rout to rule out metastasis.  Verbalizing understanding.  Follow up appointment with Dr. Nelly Rout scheduled.  Instructed to call for any questions or concerns.

## 2013-05-06 ENCOUNTER — Ambulatory Visit (HOSPITAL_BASED_OUTPATIENT_CLINIC_OR_DEPARTMENT_OTHER): Payer: Medicare Other | Admitting: Lab

## 2013-05-06 ENCOUNTER — Ambulatory Visit (HOSPITAL_COMMUNITY)
Admission: RE | Admit: 2013-05-06 | Discharge: 2013-05-06 | Disposition: A | Discharge status: Home / Self Care | Payer: Medicare Other | Source: Ambulatory Visit | Attending: Gynecologic Oncology | Admitting: Gynecologic Oncology

## 2013-05-06 ENCOUNTER — Ambulatory Visit: Payer: Medicare Other

## 2013-05-06 ENCOUNTER — Ambulatory Visit (HOSPITAL_BASED_OUTPATIENT_CLINIC_OR_DEPARTMENT_OTHER): Payer: Medicare Other | Admitting: Oncology

## 2013-05-06 ENCOUNTER — Encounter: Payer: Self-pay | Admitting: Oncology

## 2013-05-06 ENCOUNTER — Other Ambulatory Visit: Payer: Medicare Other | Admitting: Lab

## 2013-05-06 ENCOUNTER — Telehealth: Payer: Self-pay | Admitting: *Deleted

## 2013-05-06 VITALS — BP 145/81 | HR 83 | Temp 98.4°F | Resp 20 | Ht 60.79 in | Wt 163.8 lb

## 2013-05-06 DIAGNOSIS — C549 Malignant neoplasm of corpus uteri, unspecified: Secondary | ICD-10-CM | POA: Insufficient documentation

## 2013-05-06 DIAGNOSIS — R911 Solitary pulmonary nodule: Secondary | ICD-10-CM | POA: Insufficient documentation

## 2013-05-06 DIAGNOSIS — C541 Malignant neoplasm of endometrium: Secondary | ICD-10-CM

## 2013-05-06 LAB — CBC WITH DIFFERENTIAL/PLATELET
Eosinophils Absolute: 0.2 10*3/uL (ref 0.0–0.5)
HCT: 39.6 % (ref 34.8–46.6)
LYMPH%: 26.7 % (ref 14.0–49.7)
MONO#: 0.8 10*3/uL (ref 0.1–0.9)
NEUT#: 5.8 10*3/uL (ref 1.5–6.5)
NEUT%: 62.6 % (ref 38.4–76.8)
Platelets: 261 10*3/uL (ref 145–400)
WBC: 9.3 10*3/uL (ref 3.9–10.3)

## 2013-05-06 LAB — CA 125: CA 125: 9.9 U/mL (ref 0.0–30.2)

## 2013-05-06 MED ORDER — OXYCODONE HCL 5 MG PO TABS: 5.0000 mg | ORAL_TABLET | ORAL | Status: DC | PRN

## 2013-05-06 NOTE — Progress Notes (Signed)
[facility]  Telephone:([phone]) [phone] Fax:([phone]) [phone]   MEDICAL ONCOLOGY - INITIAL CONSULATION    Referral MD  Dr. Laurette Schimke  Reason for Referral: 67 year old female with probable recurrent endometrioid carcinoma being seen for consideration of chemotherapy   Chief Complaint  Patient presents with  . new patient  : Endometrioid carcinoma recurrence   HPI: Patient is a very pleasant 67 year old female who was in January 2013 seen for grade 1 endometrial adenocarcinoma. She had undergone a robotic assisted total laparoscopic hysterectomy bilateral salpingo-oophorectomy right pelvic lymph node biopsy on 12/19/2011 without any complications. 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 There was no involvement of other organs lymphovascular invasion was not identified. 3 lymph nodes were negative for metastatic disease. 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 Patient subsequently continued to be seen by gynecologic oncology. In October 2013 patient had a Pap smear that was normal limits there was noted to be some granulation tissue that was removed from the cough. There is no evidence of disease. Most recently patient was seen by Dr. Zenda Alpers on 04/29/2013 she had vaginal exam performed that was notable for a nodule in the vagina concerning for recurrence. A rectal examination revealed a firm 8 cm mass also. Because of this she had a biopsy performed. She also had a CT scan of the abdomen and pelvis ordered to further evaluate the sigmoid mass. The pathology came back inconclusive. However the CT of the abdomen and pelvis showed a lobular mass at the vagina cough measuring 7.0 x 3.2 cm proximally 4 cm craniocaudal dimension. The  mass compressed the distal aspect of the sigmoid colon just above the rectum there was no mass approximates the posterior wall of the bladder without clear invasion noted evidence of obstruction of stool proximal to this mass. There was also noted to be a large necrotic lymph node measuring 2.8 cm within the sigmoid mesocolon centrally. There are also small bilateral external iliac lymph nodes. There were no aggressive osseous lesions.  Patient is now seen in medical oncology for further treatment options including chemotherapy. Patient does have some pain she is on pain medications which is controlling the pain very well. Otherwise she is without any complaints.     Past Medical History  Diagnosis Date  . Diabetes mellitus   . PONV (postoperative nausea and vomiting)   . Cancer     endometrial  . Angina     stress test on chart from 3/12/ none since March 2012  . Chronic kidney disease     kidney stones  . Hypertension     / EKG 1/13 Epic  :  Past Surgical History  Procedure Laterality Date  . Hysteroscopy w/d&c  11/21/2011    Procedure: DILATATION AND CURETTAGE /HYSTEROSCOPY;  Surgeon: Miguel Aschoff;  Location: [facility];  Service: Gynecology;  Laterality: N/A;  . Tubal ligation    . Breast mass removal  1984, 1992    L breast  . Lithotripsy  2000, 2002  . Back surgery      91 and 2001  . Dilation and curettage of uterus    . Node dissection  12/19/2011    Procedure: NODE DISSECTION;  Surgeon: Laurette Schimke, MD PHD;  Location: [facility];  Service: Gynecology;  Laterality: N/A;  :  Current Outpatient Prescriptions  Medication Sig Dispense Refill  . amLODipine (NORVASC) 2.5 MG tablet Take 2.5 mg by mouth daily.      . cholecalciferol (VITAMIN D) 1000 UNITS tablet Take 2,000 Units by mouth daily.       [name] DOXYCYCLINE, ROSACEA, PO Take 100 mg by mouth every other day.      . lisinopril-hydrochlorothiazide (PRINZIDE,ZESTORETIC) 20-25 MG per tablet Take 1 tablet by mouth daily after  breakfast.       . Magnesium 250 MG TABS Take 1 tablet by mouth at bedtime.       . metFORMIN (GLUCOPHAGE) 500 MG tablet Take 500 mg by mouth daily with breakfast.       . metoprolol tartrate (LOPRESSOR) 25 MG tablet Take 25 mg by mouth 2 (two) times daily.       . Multiple Vitamins-Minerals (CENTRUM SILVER PO) Take 1 tablet by mouth daily.       . Omega-3 Fatty Acids (FISH OIL) 1200 MG CAPS Take 1 capsule by mouth daily.       . vitamin B-12 (CYANOCOBALAMIN) 500 MCG tablet Take 500 mcg by mouth daily.       . vitamin C (ASCORBIC ACID) 500 MG tablet Take 500 mg by mouth daily.       [name] aspirin 81 MG chewable tablet Chew 81 mg by mouth daily with breakfast.       . oxyCODONE (OXY IR/ROXICODONE) 5 MG immediate release tablet Take 1 tablet (5 mg total) by mouth every 4 (four) hours as needed for pain.  30 tablet  0   No current facility-administered medications for this visit.     No Known Allergies:  Family History  Problem Relation Age of Onset  . Heart disease Mother   . Heart attack Mother   :  History   Social History  . Marital Status: Married    Spouse Name: N/A    Number of Children: N/A  . Years of Education: N/A   Occupational History  . Not on file.   Social History Main Topics  . Smoking status: Former Smoker -- 0.50 packs/day for 8 years    Quit date: 12/14/1985  . Smokeless tobacco: Not on file  . Alcohol Use: No  . Drug Use: No  . Sexually Active: No   Other Topics Concern  . Not on file   Social History Narrative  . No narrative on file  :  Pertinent items are noted in HPI.  Exam: @IPVITALS @  General:  well-nourished in no acute distress.  Eyes:  no scleral icterus.  ENT:  There were no oropharyngeal lesions.  Neck was without thyromegaly.  Lymphatics:  Negative cervical, supraclavicular or axillary adenopathy.  Respiratory: lungs were clear bilaterally without wheezing or crackles.  Cardiovascular:  Regular rate and rhythm, S1/S2, without murmur, rub  or gallop.  There was no pedal edema.  GI:  abdomen was soft, flat, nontender, nondistended, without organomegaly.  Muscoloskeletal:  no spinal tenderness of palpation of vertebral spine.  Skin exam was without echymosis, petichae.  Neuro exam was nonfocal.  Patient was able to get on and off exam table without assistance.  Gait was normal.  Patient was alerted and oriented.  Attention was good.   Language was appropriate.  Mood was normal without depression.  Speech was not pressured.  Thought content was not tangential.     Lab Results  Component Value Date   WBC 9.3 05/06/2013   HGB 13.7 05/06/2013  HCT 39.6 05/06/2013   PLT 261 05/06/2013   GLUCOSE 114* 04/29/2013   ALT 19 12/15/2011   AST 22 12/15/2011   NA 142 04/29/2013   K 4.3 04/29/2013   CL 104 04/29/2013   CREATININE 0.9 04/29/2013   BUN 18.3 04/29/2013   CO2 27 04/29/2013    Dg Chest 2 View  05/06/2013   *RADIOLOGY REPORT*  Clinical Data: Endometrial cancer  CHEST - 2 VIEW  Comparison: December 15, 2011.  Findings: Cardiomediastinal silhouette appears normal.  No acute pulmonary disease is noted.  Bony thorax is intact.  Stable small dense nodule seen in right lower lobe compared to prior exam most consistent with granuloma.  IMPRESSION: Stable small nodule seen in right lower lobe most consistent with granuloma.  No acute cardiopulmonary abnormality seen.   Original Report Authenticated By: Lupita Raider.,  M.D.   Ct Abdomen Pelvis W Contrast  05/05/2013   *RADIOLOGY REPORT*  Clinical Data: Pelvic pain.  History of endometrial carcinoma.  CT ABDOMEN AND PELVIS WITH CONTRAST  Technique:  Multidetector CT imaging of the abdomen and pelvis was performed following the standard protocol during bolus administration of intravenous contrast.  Contrast: OMNIPAQUE IOHEXOL 300 MG/ML  SOLN  Comparison: CT 01/05/2006  Findings: Lung bases are clear.  No pericardial fluid.  No focal hepatic lesion.  The gallbladder, pancreas, spleen, adrenal glands,  kidneys are normal.  There is a cortical scarring of the right kidney.  No hydronephrosis.  The stomach, small bowel, appendix, cecum are normal.  The colon is normal to the level of the descending colon.  There is a lobular mass at the vaginal cuff measuring 7.0 x 3.2 cm in axial dimension and approximately 4 cm craniocaudad dimension. There is a mass compresses the distal aspect of the sigmoid colon just above the rectum (image 62, axial, image 57, coronal series). No mass approximates the posterior wall the bladder without clear invasion .There is no evidence of obstruction of stool proximal to this mass compressing the sigmoid colon.  There is a large necrotic lymph node measuring 2.8 cm within the sigmoid mesocolon centrally (60).  Patient status post hysterectomy and oophorectomy.  Small bilateral external iliac lymph nodes.  For example 8 mm node on the right and 5 mm node on the left (image 59).  There is no periaortic adenopathy.  No periportal or mesenteric adenopathy  Review of the bone windows demonstrates post lumbar  fusion.  No aggressive osseous lesions.  IMPRESSION:  1.  Lobular mass at the vaginal cuff is consistent with endometrial carcinoma progression.  2.  Mass extends to the serosal surface of the sigmoid colon and compresses the lumen of the sigmoid colon.  No obstruction at this time.  3.  Mass  approximates the posterior wall of the bladder without clear invasion.  4. Large necrotic lymph node within the sigmoid mesocolon superior to the vaginal cuff.  5.  Small bilateral iliac lymph nodes.   Original Report Authenticated By: Genevive Bi, M.D.    Pathology: Diagnosis Vagina, biopsy, apex - SLIGHTLY INFLAMED SQUAMOUS EPITHELIUM. - NO EVIDENCE OF DYSPLASIA OR MALIGNANCY. Abigail Miyamoto MD Pathologist, Electronic Signature (Case signed 04/30/2013) Specimen Gross and Clinical Information Specimen(s) Dg Chest 2 View  05/06/2013   *RADIOLOGY REPORT*  Clinical Data: Endometrial  cancer  CHEST - 2 VIEW  Comparison: December 15, 2011.  Findings: Cardiomediastinal silhouette appears normal.  No acute pulmonary disease is noted.  Bony thorax is intact.  Stable small dense  nodule seen in right lower lobe compared to prior exam most consistent with granuloma.  IMPRESSION: Stable small nodule seen in right lower lobe most consistent with granuloma.  No acute cardiopulmonary abnormality seen.   Original Report Authenticated By: Lupita Raider.,  M.D.   Ct Abdomen Pelvis W Contrast  05/05/2013   *RADIOLOGY REPORT*  Clinical Data: Pelvic pain.  History of endometrial carcinoma.  CT ABDOMEN AND PELVIS WITH CONTRAST  Technique:  Multidetector CT imaging of the abdomen and pelvis was performed following the standard protocol during bolus administration of intravenous contrast.  Contrast: OMNIPAQUE IOHEXOL 300 MG/ML  SOLN  Comparison: CT 01/05/2006  Findings: Lung bases are clear.  No pericardial fluid.  No focal hepatic lesion.  The gallbladder, pancreas, spleen, adrenal glands, kidneys are normal.  There is a cortical scarring of the right kidney.  No hydronephrosis.  The stomach, small bowel, appendix, cecum are normal.  The colon is normal to the level of the descending colon.  There is a lobular mass at the vaginal cuff measuring 7.0 x 3.2 cm in axial dimension and approximately 4 cm craniocaudad dimension. There is a mass compresses the distal aspect of the sigmoid colon just above the rectum (image 62, axial, image 57, coronal series). No mass approximates the posterior wall the bladder without clear invasion .There is no evidence of obstruction of stool proximal to this mass compressing the sigmoid colon.  There is a large necrotic lymph node measuring 2.8 cm within the sigmoid mesocolon centrally (60).  Patient status post hysterectomy and oophorectomy.  Small bilateral external iliac lymph nodes.  For example 8 mm node on the right and 5 mm node on the left (image 59).  There is no  periaortic adenopathy.  No periportal or mesenteric adenopathy  Review of the bone windows demonstrates post lumbar  fusion.  No aggressive osseous lesions.  IMPRESSION:  1.  Lobular mass at the vaginal cuff is consistent with endometrial carcinoma progression.  2.  Mass extends to the serosal surface of the sigmoid colon and compresses the lumen of the sigmoid colon.  No obstruction at this time.  3.  Mass  approximates the posterior wall of the bladder without clear invasion.  4. Large necrotic lymph node within the sigmoid mesocolon superior to the vaginal cuff.  5.  Small bilateral iliac lymph nodes.   Original Report Authenticated By: Genevive Bi, M.D.    Assessment and Plan: 67 year old female with  #1 history of stage I endometrioid endometrial carcinoma originally diagnosed in 2013. Patient continues to do well except for some granulation tissue at the vaginal cough. Most recent examination performed by Dr. Nelly Rout revealed that she also had a firm mass concerning for possibility of recurrence. Therefore she had a biopsy performed as well as CT scans. The biopsy only showed inflammatory tissue but the CT is still very concerning for the possibility of recurrent disease. I have discussed with the patient and her family the CT results as well as the pathology results. My recommendation at this time is for patient to proceed with scans including PET scan to further evaluate disease. She will also need another biopsy. We certainly could have this done by interventional radiology. It is extremely important as have explained to the family to make a tissue diagnosis. If patient indeed does have a recurrent disease then she will need systemic chemotherapy. I do not believe that she at this point is a surgical candidate.  #2 we did discuss the possibility  of type of chemotherapy that she will need. This would include Taxol and carboplatinum given every 3 weeks with day 2 Neulasta for 6 cycles or so. We  discussed the rationale side effects of it briefly.  #3 I will plan on seeing the patient back after she has had a tissue diagnosis and staging scans.   The length of time of the face-to-face encounter was 60    minutes. More than 50% of time was spent counseling and coordination of care.  Drue Second, MD Medical/Oncology [facility] [phone] (beeper) [phone] (Office)

## 2013-05-06 NOTE — Telephone Encounter (Signed)
appts made and printed. Pt is aware that cs will call her w/ her appt d/t for her PET and CT biopsy...td

## 2013-05-06 NOTE — Patient Instructions (Addendum)
#  1 your seen today to discuss your diagnosis of possible recurrent endometrial cancer presenting as a pelvic mass.  #2 we discussed your CT scan findings.  #3 I discussed the biopsy results from the vaginal biopsy that was performed by Dr. Nelly Rout. Unfortunately it did not show cancer. Therefore I have recommended that we proceed with a CT-guided biopsy of the pelvic mass to make a tissue diagnosis.  #4 we discussed that if this is a recurrent cancer that you would be a candidate for chemotherapy. But we did not discuss the specifics of types of chemotherapy we will do so after we have a tissue diagnosis.  #5 I will see you back in 2 weeks time or sooner if need arises.  #6 we discussed stopping any kind of aspirin or nonsteroidal drugs such as ibuprofen.  #7 I will give you a prescription for Vicodin for pain. We also discussed using a stool softener to help prevent constipation.

## 2013-05-06 NOTE — Progress Notes (Signed)
Checked in new pt with no financial concerns. °

## 2013-05-07 ENCOUNTER — Encounter (HOSPITAL_COMMUNITY): Payer: Self-pay | Admitting: Pharmacy Technician

## 2013-05-09 ENCOUNTER — Encounter (HOSPITAL_COMMUNITY): Payer: Self-pay

## 2013-05-09 ENCOUNTER — Encounter (HOSPITAL_COMMUNITY)
Admission: RE | Admit: 2013-05-09 | Discharge: 2013-05-09 | Disposition: A | Discharge status: Home / Self Care | Payer: Medicare Other | Source: Ambulatory Visit | Attending: Oncology | Admitting: Oncology

## 2013-05-09 DIAGNOSIS — Z90710 Acquired absence of both cervix and uterus: Secondary | ICD-10-CM | POA: Insufficient documentation

## 2013-05-09 DIAGNOSIS — C775 Secondary and unspecified malignant neoplasm of intrapelvic lymph nodes: Secondary | ICD-10-CM | POA: Insufficient documentation

## 2013-05-09 DIAGNOSIS — C549 Malignant neoplasm of corpus uteri, unspecified: Secondary | ICD-10-CM | POA: Insufficient documentation

## 2013-05-09 DIAGNOSIS — C541 Malignant neoplasm of endometrium: Secondary | ICD-10-CM

## 2013-05-09 DIAGNOSIS — I7090 Unspecified atherosclerosis: Secondary | ICD-10-CM | POA: Insufficient documentation

## 2013-05-09 MED ORDER — FLUDEOXYGLUCOSE F - 18 (FDG) INJECTION
18.3000 | Freq: Once | INTRAVENOUS | Status: AC | PRN
  Administered 2013-05-09: 18.3 via INTRAVENOUS

## 2013-05-12 ENCOUNTER — Other Ambulatory Visit: Payer: Self-pay | Admitting: Radiology

## 2013-05-13 ENCOUNTER — Telehealth: Payer: Self-pay | Admitting: Gynecologic Oncology

## 2013-05-13 DIAGNOSIS — R109 Unspecified abdominal pain: Secondary | ICD-10-CM

## 2013-05-13 MED ORDER — OXYCODONE HCL 10 MG PO TABS: 10.0000 mg | ORAL_TABLET | ORAL | Status: DC | PRN

## 2013-05-13 NOTE — Telephone Encounter (Signed)
Patient called requesting results of PET scan.  Notified of results.  Requesting additional pain medication.  Reporting moderate pelvic pain.  Using oxycodone one tablet every four hours and "able to tell when it wears off."  Reportable signs and symptoms reviewed.  Plans for Ct guided Bx in the am.  Refill written for oxycodone 10 mg every four to six hours as needed for moderate to severe pain, #60, 0 refills.  Refill discussed with Dr. Welton Flakes who agrees with above plan.  Patient instructed to call for any questions or concerns.

## 2013-05-14 ENCOUNTER — Ambulatory Visit (HOSPITAL_COMMUNITY)
Admission: RE | Admit: 2013-05-14 | Discharge: 2013-05-14 | Disposition: A | Discharge status: Home / Self Care | Payer: Medicare Other | Source: Ambulatory Visit | Attending: Oncology | Admitting: Oncology

## 2013-05-14 ENCOUNTER — Inpatient Hospital Stay (HOSPITAL_COMMUNITY): Admission: RE | Admit: 2013-05-14 | Payer: Medicare Other | Source: Ambulatory Visit

## 2013-05-14 ENCOUNTER — Encounter (HOSPITAL_COMMUNITY): Payer: Self-pay

## 2013-05-14 DIAGNOSIS — Z87442 Personal history of urinary calculi: Secondary | ICD-10-CM | POA: Insufficient documentation

## 2013-05-14 DIAGNOSIS — C541 Malignant neoplasm of endometrium: Secondary | ICD-10-CM

## 2013-05-14 DIAGNOSIS — Z8542 Personal history of malignant neoplasm of other parts of uterus: Secondary | ICD-10-CM | POA: Insufficient documentation

## 2013-05-14 DIAGNOSIS — I10 Essential (primary) hypertension: Secondary | ICD-10-CM | POA: Insufficient documentation

## 2013-05-14 DIAGNOSIS — E119 Type 2 diabetes mellitus without complications: Secondary | ICD-10-CM | POA: Insufficient documentation

## 2013-05-14 DIAGNOSIS — C50919 Malignant neoplasm of unspecified site of unspecified female breast: Secondary | ICD-10-CM | POA: Insufficient documentation

## 2013-05-14 LAB — APTT: aPTT: 28 seconds (ref 24–37)

## 2013-05-14 MED ORDER — LIDOCAINE-EPINEPHRINE 1 %-1:100000 IJ SOLN
INTRAMUSCULAR | Status: AC
  Filled 2013-05-14: qty 1

## 2013-05-14 MED ORDER — MIDAZOLAM HCL 2 MG/2ML IJ SOLN
INTRAMUSCULAR | Status: AC | PRN
  Administered 2013-05-14: 2 mg via INTRAVENOUS

## 2013-05-14 MED ORDER — HYDROCODONE-ACETAMINOPHEN 5-325 MG PO TABS: 1.0000 | ORAL_TABLET | ORAL | Status: DC | PRN

## 2013-05-14 MED ORDER — SODIUM CHLORIDE 0.9 % IV SOLN
INTRAVENOUS | Status: DC
  Administered 2013-05-14: 08:00:00 via INTRAVENOUS

## 2013-05-14 MED ORDER — FENTANYL CITRATE 0.05 MG/ML IJ SOLN
INTRAMUSCULAR | Status: AC | PRN
  Administered 2013-05-14 (×2): 50 ug via INTRAVENOUS

## 2013-05-14 MED ORDER — MIDAZOLAM HCL 2 MG/2ML IJ SOLN
INTRAMUSCULAR | Status: AC
  Filled 2013-05-14: qty 6

## 2013-05-14 MED ORDER — FENTANYL CITRATE 0.05 MG/ML IJ SOLN
INTRAMUSCULAR | Status: AC
Start: 2013-05-14 — End: 2013-05-14
  Filled 2013-05-14: qty 4

## 2013-05-14 NOTE — H&P (Signed)
Pam Vazquez is an 67 y.o. female.   Chief Complaint: Hx endometrial ca Dx 11/2011 New pelvic pain recently CT and PET+ pelvic mass Now scheduled for biopsy of pelvic mass HPI: DM; endomet ca; CKD; HTN  Past Medical History  Diagnosis Date  . Diabetes mellitus   . PONV (postoperative nausea and vomiting)   . Cancer     endometrial  . Angina     stress test on chart from 3/12/ none since March 2012  . Chronic kidney disease     kidney stones  . Hypertension     / EKG 1/13 Epic    Past Surgical History  Procedure Laterality Date  . Hysteroscopy w/d&c  11/21/2011    Procedure: DILATATION AND CURETTAGE /HYSTEROSCOPY;  Surgeon: Miguel Aschoff;  Location: [facility];  Service: Gynecology;  Laterality: N/A;  . Tubal ligation    . Breast mass removal  1984, 1992    L breast  . Lithotripsy  2000, 2002  . Back surgery      91 and 2001  . Dilation and curettage of uterus    . Node dissection  12/19/2011    Procedure: NODE DISSECTION;  Surgeon: Laurette Schimke, MD PHD;  Location: [facility];  Service: Gynecology;  Laterality: N/A;    Family History  Problem Relation Age of Onset  . Heart disease Mother   . Heart attack Mother    Social History:  reports that she quit smoking about 27 years ago. She does not have any smokeless tobacco history on file. She reports that she does not drink alcohol or use illicit drugs.  Allergies: No Known Allergies   (Not in a hospital admission)  Results for orders placed during the hospital encounter of 05/14/13 (from the past 48 hour(s))  GLUCOSE, CAPILLARY     Status: Abnormal   Collection Time    05/14/13  7:32 AM      Result Value Range   Glucose-Capillary 149 (*) 70 - 99 mg/dL  APTT     Status: None   Collection Time    05/14/13  7:53 AM      Result Value Range   aPTT 28  24 - 37 seconds  PROTIME-INR     Status: None   Collection Time    05/14/13  7:53 AM      Result Value Range   Prothrombin Time 13.4  11.6 - 15.2 seconds   INR 1.04  0.00 -  1.49   No results found.  Review of Systems  Constitutional: Negative for fever.  Respiratory: Negative for shortness of breath.   Cardiovascular: Negative for chest pain.  Gastrointestinal: Positive for abdominal pain. Negative for nausea and vomiting.  Neurological: Negative for dizziness and headaches.    Blood pressure 113/71, pulse 75, temperature 98.1 F (36.7 C), temperature source Oral, resp. rate 20, height 5' (1.524 m), weight 163 lb (73.936 kg), SpO2 100.00%. Physical Exam  Constitutional: She is oriented to person, place, and time. She appears well-developed and well-nourished.  Cardiovascular: Normal rate, regular rhythm and normal heart sounds.   No murmur heard. Respiratory: Effort normal and breath sounds normal. She has no wheezes.  GI: Soft. Bowel sounds are normal. There is tenderness.  Musculoskeletal: Normal range of motion.  Neurological: She is alert and oriented to person, place, and time.  Psychiatric: She has a normal mood and affect. Her behavior is normal. Judgment and thought content normal.     Assessment/Plan Hx endometrial ca New pelvic mass  Scheduled for bx now Pt aware of procedure benefits and risks and agreeable to proceed Consent signed and in chart  Tabathia Knoche A 05/14/2013, 8:37 AM

## 2013-05-14 NOTE — Procedures (Signed)
CT guided biopsy of pelvic mass.  3 cores obtained.  No immediate complication.

## 2013-05-15 ENCOUNTER — Ambulatory Visit: Payer: Medicare Other | Admitting: Gynecologic Oncology

## 2013-05-20 ENCOUNTER — Ambulatory Visit (HOSPITAL_BASED_OUTPATIENT_CLINIC_OR_DEPARTMENT_OTHER): Payer: Medicare Other | Admitting: Oncology

## 2013-05-20 ENCOUNTER — Encounter: Payer: Self-pay | Admitting: Oncology

## 2013-05-20 ENCOUNTER — Telehealth: Payer: Self-pay | Admitting: *Deleted

## 2013-05-20 VITALS — BP 146/60 | HR 108 | Temp 98.0°F | Resp 20 | Ht 60.0 in | Wt 158.9 lb

## 2013-05-20 DIAGNOSIS — C549 Malignant neoplasm of corpus uteri, unspecified: Secondary | ICD-10-CM

## 2013-05-20 DIAGNOSIS — C541 Malignant neoplasm of endometrium: Secondary | ICD-10-CM

## 2013-05-20 MED ORDER — LIDOCAINE-PRILOCAINE 2.5-2.5 % EX CREA: TOPICAL_CREAM | CUTANEOUS | Status: AC | PRN

## 2013-05-20 MED ORDER — ONDANSETRON HCL 8 MG PO TABS: 8.0000 mg | ORAL_TABLET | Freq: Two times a day (BID) | ORAL | Status: DC

## 2013-05-20 MED ORDER — PROCHLORPERAZINE MALEATE 10 MG PO TABS: 10.0000 mg | ORAL_TABLET | Freq: Four times a day (QID) | ORAL | Status: DC | PRN

## 2013-05-20 MED ORDER — PROCHLORPERAZINE 25 MG RE SUPP: 25.0000 mg | Freq: Two times a day (BID) | RECTAL | Status: DC | PRN

## 2013-05-20 MED ORDER — DEXAMETHASONE 4 MG PO TABS: 8.0000 mg | ORAL_TABLET | Freq: Two times a day (BID) | ORAL | Status: DC

## 2013-05-20 MED ORDER — LORAZEPAM 0.5 MG PO TABS: 0.5000 mg | ORAL_TABLET | Freq: Four times a day (QID) | ORAL | Status: DC | PRN

## 2013-05-20 NOTE — Patient Instructions (Addendum)
#1 we discussed the pathology results as well as PET scan results today.  #2 we discussed chemotherapy treatment with Taxol and carboplatinum. We discussed that chemotherapy will be given every 3 weeks for a total of 6-8 cycles.  #3 we do need a Port-A-Cath placed and I have consulted interventional radiology to do this for [place].  #4 you'll need chemotherapy teaching class and I will get this scheduled.  #5 plan is to begin you on chemotherapy on 06/03/2013.  Paclitaxel injection What is this medicine? PACLITAXEL (PAK li TAX el) is a chemotherapy drug. It targets fast dividing cells, like cancer cells, and causes these cells to die. This medicine is used to treat ovarian cancer, breast cancer, and other cancers. This medicine may be used for other purposes; ask your health care provider or pharmacist if you have questions. What should I tell my health care provider before I take this medicine? They need to know if you have any of these conditions: -blood disorders -irregular heartbeat -infection (especially a virus infection such as chickenpox, cold sores, or herpes) -liver disease -previous or ongoing radiation therapy -an unusual or allergic reaction to paclitaxel, alcohol, polyoxyethylated castor oil, other chemotherapy agents, other medicines, foods, dyes, or preservatives -pregnant or trying to get pregnant -breast-feeding How should I use this medicine? This drug is given as an infusion into a vein. It is administered in a hospital or clinic by a specially trained health care professional. Talk to your pediatrician regarding the use of this medicine in children. Special care may be needed. Overdosage: If you think you have taken too much of this medicine contact a poison control center or emergency room at once. NOTE: This medicine is only for you. Do not share this medicine with others. What if I miss a dose? It is important not to miss your dose. Call your doctor or health care  professional if you are unable to keep an appointment. What may interact with this medicine? Do not take this medicine with any of the following medications: -disulfiram -metronidazole This medicine may also interact with the following medications: -cyclosporine -dexamethasone -diazepam -ketoconazole -medicines to increase blood counts like filgrastim, pegfilgrastim, sargramostim -other chemotherapy drugs like cisplatin, doxorubicin, epirubicin, etoposide, teniposide, vincristine -quinidine -testosterone -vaccines -verapamil Talk to your doctor or health care professional before taking any of these medicines: -acetaminophen -aspirin -ibuprofen -ketoprofen -naproxen This list may not describe all possible interactions. Give your health care provider a list of all the medicines, herbs, non-prescription drugs, or dietary supplements you use. Also tell them if you smoke, drink alcohol, or use illegal drugs. Some items may interact with your medicine. What should I watch for while using this medicine? Your condition will be monitored carefully while you are receiving this medicine. You will need important blood work done while you are taking this medicine. This drug may make you feel generally unwell. This is not uncommon, as chemotherapy can affect healthy cells as well as cancer cells. Report any side effects. Continue your course of treatment even though you feel ill unless your doctor tells you to stop. In some cases, you may be given additional medicines to help with side effects. Follow all directions for their use. Call your doctor or health care professional for advice if you get a fever, chills or sore throat, or other symptoms of a cold or flu. Do not treat yourself. This drug decreases your body's ability to fight infections. Try to avoid being around people who are sick. This medicine  may increase your risk to bruise or bleed. Call your doctor or health care professional if you  notice any unusual bleeding. Be careful brushing and flossing your teeth or using a toothpick because you may get an infection or bleed more easily. If you have any dental work done, tell your dentist you are receiving this medicine. Avoid taking products that contain aspirin, acetaminophen, ibuprofen, naproxen, or ketoprofen unless instructed by your doctor. These medicines may hide a fever. Do not become pregnant while taking this medicine. Women should inform their doctor if they wish to become pregnant or think they might be pregnant. There is a potential for serious side effects to an unborn child. Talk to your health care professional or pharmacist for more information. Do not breast-feed an infant while taking this medicine. Men are advised not to father a child while receiving this medicine. What side effects may I notice from receiving this medicine? Side effects that you should report to your doctor or health care professional as soon as possible: -allergic reactions like skin rash, itching or hives, swelling of the face, lips, or tongue -low blood counts - This drug may decrease the number of white blood cells, red blood cells and platelets. You may be at increased risk for infections and bleeding. -signs of infection - fever or chills, cough, sore throat, pain or difficulty passing urine -signs of decreased platelets or bleeding - bruising, pinpoint red spots on the skin, black, tarry stools, nosebleeds -signs of decreased red blood cells - unusually weak or tired, fainting spells, lightheadedness -breathing problems -chest pain -high or low blood pressure -mouth sores -nausea and vomiting -pain, swelling, redness or irritation at the injection site -pain, tingling, numbness in the hands or feet -slow or irregular heartbeat -swelling of the ankle, feet, hands Side effects that usually do not require medical attention (report to your doctor or health care professional if they continue  or are bothersome): -bone pain -complete hair loss including hair on your head, underarms, pubic hair, eyebrows, and eyelashes -changes in the color of fingernails -diarrhea -loosening of the fingernails -loss of appetite -muscle or joint pain -red flush to skin -sweating This list may not describe all possible side effects. Call your doctor for medical advice about side effects. You may report side effects to FDA at 1-800-FDA-1088. Where should I keep my medicine? This drug is given in a hospital or clinic and will not be stored at home. NOTE: This sheet is a summary. It may not cover all possible information. If you have questions about this medicine, talk to your doctor, pharmacist, or health care provider.  2013, [employer]. (10/12/2008 11:54:26 AM)   Carboplatin injection What is this medicine? CARBOPLATIN (KAR boe pla tin) is a chemotherapy drug. It targets fast dividing cells, like cancer cells, and causes these cells to die. This medicine is used to treat ovarian cancer and many other cancers. This medicine may be used for other purposes; ask your health care provider or pharmacist if you have questions. What should I tell my health care provider before I take this medicine? They need to know if you have any of these conditions: -blood disorders -hearing problems -kidney disease -recent or ongoing radiation therapy -an unusual or allergic reaction to carboplatin, cisplatin, other chemotherapy, other medicines, foods, dyes, or preservatives -pregnant or trying to get pregnant -breast-feeding How should I use this medicine? This drug is usually given as an infusion into a vein. It is administered in a hospital or  clinic by a specially trained health care professional. Talk to your pediatrician regarding the use of this medicine in children. Special care may be needed. Overdosage: If you think you have taken too much of this medicine contact a poison control center  or emergency room at once. NOTE: This medicine is only for you. Do not share this medicine with others. What if I miss a dose? It is important not to miss a dose. Call your doctor or health care professional if you are unable to keep an appointment. What may interact with this medicine? -medicines for seizures -medicines to increase blood counts like filgrastim, pegfilgrastim, sargramostim -some antibiotics like amikacin, gentamicin, neomycin, streptomycin, tobramycin -vaccines Talk to your doctor or health care professional before taking any of these medicines: -acetaminophen -aspirin -ibuprofen -ketoprofen -naproxen This list may not describe all possible interactions. Give your health care provider a list of all the medicines, herbs, non-prescription drugs, or dietary supplements you use. Also tell them if you smoke, drink alcohol, or use illegal drugs. Some items may interact with your medicine. What should I watch for while using this medicine? Your condition will be monitored carefully while you are receiving this medicine. You will need important blood work done while you are taking this medicine. This drug may make you feel generally unwell. This is not uncommon, as chemotherapy can affect healthy cells as well as cancer cells. Report any side effects. Continue your course of treatment even though you feel ill unless your doctor tells you to stop. In some cases, you may be given additional medicines to help with side effects. Follow all directions for their use. Call your doctor or health care professional for advice if you get a fever, chills or sore throat, or other symptoms of a cold or flu. Do not treat yourself. This drug decreases your body's ability to fight infections. Try to avoid being around people who are sick. This medicine may increase your risk to bruise or bleed. Call your doctor or health care professional if you notice any unusual bleeding. Be careful brushing and  flossing your teeth or using a toothpick because you may get an infection or bleed more easily. If you have any dental work done, tell your dentist you are receiving this medicine. Avoid taking products that contain aspirin, acetaminophen, ibuprofen, naproxen, or ketoprofen unless instructed by your doctor. These medicines may hide a fever. Do not become pregnant while taking this medicine. Women should inform their doctor if they wish to become pregnant or think they might be pregnant. There is a potential for serious side effects to an unborn child. Talk to your health care professional or pharmacist for more information. Do not breast-feed an infant while taking this medicine. What side effects may I notice from receiving this medicine? Side effects that you should report to your doctor or health care professional as soon as possible: -allergic reactions like skin rash, itching or hives, swelling of the face, lips, or tongue -signs of infection - fever or chills, cough, sore throat, pain or difficulty passing urine -signs of decreased platelets or bleeding - bruising, pinpoint red spots on the skin, black, tarry stools, nosebleeds -signs of decreased red blood cells - unusually weak or tired, fainting spells, lightheadedness -breathing problems -changes in hearing -changes in vision -chest pain -high blood pressure -low blood counts - This drug may decrease the number of white blood cells, red blood cells and platelets. You may be at increased risk for infections and bleeding. -nausea  and vomiting -pain, swelling, redness or irritation at the injection site -pain, tingling, numbness in the hands or feet -problems with balance, talking, walking -trouble passing urine or change in the amount of urine Side effects that usually do not require medical attention (report to your doctor or health care professional if they continue or are bothersome): -hair loss -loss of appetite -metallic taste in  the mouth or changes in taste This list may not describe all possible side effects. Call your doctor for medical advice about side effects. You may report side effects to FDA at 1-800-FDA-1088. Where should I keep my medicine? This drug is given in a hospital or clinic and will not be stored at home. NOTE: This sheet is a summary. It may not cover all possible information. If you have questions about this medicine, talk to your doctor, pharmacist, or health care provider.  2012, [employer]. (02/04/2008 2:38:05 PM)  Pegfilgrastim injection What is this medicine? PEGFILGRASTIM (peg fil GRA stim) helps the body make more white blood cells. It is used to prevent infection in people with low amounts of white blood cells following cancer treatment. This medicine may be used for other purposes; ask your health care provider or pharmacist if you have questions. What should I tell my health care provider before I take this medicine? They need to know if you have any of these conditions: -sickle cell disease -an unusual or allergic reaction to pegfilgrastim, filgrastim, E.coli protein, other medicines, foods, dyes, or preservatives -pregnant or trying to get pregnant -breast-feeding How should I use this medicine? This medicine is for injection under the skin. It is usually given by a health care professional in a hospital or clinic setting. If you get this medicine at home, you will be taught how to prepare and give this medicine. Do not shake this medicine. Use exactly as directed. Take your medicine at regular intervals. Do not take your medicine more often than directed. It is important that you put your used needles and syringes in a special sharps container. Do not put them in a trash can. If you do not have a sharps container, call your pharmacist or healthcare provider to get one. Talk to your pediatrician regarding the use of this medicine in children. While this drug may be  prescribed for children who weigh more than 45 kg for selected conditions, precautions do apply Overdosage: If you think you have taken too much of this medicine contact a poison control center or emergency room at once. NOTE: This medicine is only for you. Do not share this medicine with others. What if I miss a dose? If you miss a dose, take it as soon as you can. If it is almost time for your next dose, take only that dose. Do not take double or extra doses. What may interact with this medicine? -lithium -medicines for growth therapy This list may not describe all possible interactions. Give your health care provider a list of all the medicines, herbs, non-prescription drugs, or dietary supplements you use. Also tell them if you smoke, drink alcohol, or use illegal drugs. Some items may interact with your medicine. What should I watch for while using this medicine? Visit your doctor for regular check ups. You will need important blood work done while you are taking this medicine. What side effects may I notice from receiving this medicine? Side effects that you should report to your doctor or health care professional as soon as possible: -allergic reactions like skin  rash, itching or hives, swelling of the face, lips, or tongue -breathing problems -fever -pain, redness, or swelling where injected -shoulder pain -stomach or side pain Side effects that usually do not require medical attention (report to your doctor or health care professional if they continue or are bothersome): -aches, pains -headache -loss of appetite -nausea, vomiting -unusually tired This list may not describe all possible side effects. Call your doctor for medical advice about side effects. You may report side effects to FDA at 1-800-FDA-1088. Where should I keep my medicine? Keep out of the reach of children. Store in a refrigerator between 2 and 8 degrees C (36 and 46 degrees F). Do not freeze. Keep in carton to  protect from light. Throw away this medicine if it is left out of the refrigerator for more than 48 hours. Throw away any unused medicine after the expiration date. NOTE: This sheet is a summary. It may not cover all possible information. If you have questions about this medicine, talk to your doctor, pharmacist, or health care provider.  2013, [employer]. (06/01/2008 3:41:44 PM)

## 2013-05-20 NOTE — Progress Notes (Signed)
OFFICE PROGRESS NOTE  CC  [name],[name], MD [address], [place] [address] Dr. Emmaline Kluver Dr. 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 She had undergone a robotic assisted total laparoscopic hysterectomy bilateral salpingo-oophorectomy right pelvic lymph node biopsy on 12/19/2011 without any complications. 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 There was no involvement of other organs lymphovascular invasion was not identified. 3 lymph nodes were negative for metastatic disease. 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 Patient subsequently continued to be seen by gynecologic oncology. In October 2013 patient had a Pap smear that was normal limits there was noted to be some granulation tissue that was removed from the cough. There is no evidence of disease.  #2patient was seen by Dr. Zenda Alpers on 04/29/2013 she had vaginal exam performed that was notable for a nodule in the vagina concerning for recurrence. A rectal examination revealed a firm 8 cm mass also. Because of this she had a biopsy performed. She also had a CT scan of the abdomen and pelvis ordered to further evaluate the sigmoid mass. The pathology came back inconclusive. However the CT of the abdomen and pelvis showed a lobular mass at the vagina cough measuring 7.0 x 3.2 cm proximally 4 cm craniocaudal dimension. The mass compressed the distal aspect of the sigmoid colon just above the rectum there was no mass approximates the posterior wall of the bladder without clear invasion noted evidence of  obstruction of stool proximal to this mass. There was also noted to be a large necrotic lymph node measuring 2.8 cm within the sigmoid mesocolon centrally. There are also small bilateral external iliac lymph nodes. There were no aggressive osseous lesions.   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  CURRENT THERAPY:patient will begin Taxol carboplatinum every 21 days  INTERVAL HISTORY: Pam Vazquez 67 y.o. female returns for followup visit after having CT scans and biopsy performed. Unfortunately biopsy is positive for recurrent adeno carcinoma of the uterus. Clinically patient seems to be doing well she is in pain most likely due to the disease itself. We discussed the pathology today.  MEDICAL HISTORY: Past Medical History  Diagnosis Date  . Diabetes mellitus   . PONV (postoperative nausea and vomiting)   . Cancer     endometrial  . Angina     stress test on chart from 3/12/ none since March 2012  . Chronic kidney disease     kidney stones  . Hypertension     / EKG 1/13 Epic    ALLERGIES:  has No Known Allergies.  MEDICATIONS:  Current Outpatient Prescriptions  Medication Sig Dispense Refill  . amLODipine (NORVASC) 2.5 MG tablet Take 2.5 mg by mouth every morning.       . cholecalciferol (VITAMIN D) 1000 UNITS tablet Take 1,000 Units by mouth every morning.       [name] doxycycline (VIBRA-TABS) 100 MG tablet Take 100 mg by mouth every other day.      . lisinopril-hydrochlorothiazide (PRINZIDE,ZESTORETIC) 20-25 MG per tablet Take 1 tablet by mouth daily after breakfast.       .  Magnesium 250 MG TABS Take 1 tablet by mouth at bedtime.       . metFORMIN (GLUCOPHAGE) 500 MG tablet Take 500 mg by mouth every morning.       . metoprolol tartrate (LOPRESSOR) 25 MG tablet Take 25 mg by mouth 2 (two) times daily.       . Multiple Vitamin (MULTIVITAMIN WITH MINERALS) TABS Take 1 tablet by mouth  every morning. She takes Surveyor, quantity.      . Omega-3 Fatty Acids (FISH OIL) 1200 MG CAPS Take 1,200 mg by mouth every morning.       . Oxycodone HCl 10 MG TABS Take 1 tablet (10 mg total) by mouth every 4 (four) hours as needed (for moderate to severe pain).  60 tablet  0  . vitamin B-12 (CYANOCOBALAMIN) 500 MCG tablet Take 500 mcg by mouth at bedtime.       [name] aspirin EC 81 MG tablet Take 81 mg by mouth every morning.      . vitamin C (ASCORBIC ACID) 500 MG tablet Take 500 mg by mouth at bedtime.        No current facility-administered medications for this visit.    SURGICAL HISTORY:  Past Surgical History  Procedure Laterality Date  . Hysteroscopy w/d&c  11/21/2011    Procedure: DILATATION AND CURETTAGE /HYSTEROSCOPY;  Surgeon: Miguel Aschoff;  Location: [facility];  Service: Gynecology;  Laterality: N/A;  . Tubal ligation    . Breast mass removal  1984, 1992    L breast  . Lithotripsy  2000, 2002  . Back surgery      91 and 2001  . Dilation and curettage of uterus    . Node dissection  12/19/2011    Procedure: NODE DISSECTION;  Surgeon: Laurette Schimke, MD PHD;  Location: [facility];  Service: Gynecology;  Laterality: N/A;    REVIEW OF SYSTEMS:  Pertinent items are noted in HPI.   HEALTH MAINTENANCE:  PHYSICAL EXAMINATION: Blood pressure 146/60, pulse 108, temperature 98 F (36.7 C), temperature source Oral, resp. rate 20, height 5' (1.524 m), weight 158 lb 14.4 oz (72.077 kg). Body mass index is 31.03 kg/(m^2). ECOG PERFORMANCE STATUS: 0 - Asymptomatic   full examination was not   LABORATORY DATA: Lab Results  Component Value Date   WBC 9.3 05/06/2013   HGB 13.7 05/06/2013   HCT 39.6 05/06/2013   MCV 90.8 05/06/2013   PLT 261 05/06/2013      Chemistry      Component Value Date/Time   NA 142 04/29/2013 1229   NA 141 12/20/2011 0334   K 4.3 04/29/2013 1229   K 4.1 12/20/2011 0334   CL 104 04/29/2013 1229   CL 104 12/20/2011 0334   CO2 27 04/29/2013 1229   CO2 27 12/20/2011 0334   BUN  18.3 04/29/2013 1229   BUN 10 12/20/2011 0334   CREATININE 0.9 04/29/2013 1229   CREATININE 0.80 12/20/2011 0334      Component Value Date/Time   CALCIUM 11.1* 04/29/2013 1229   CALCIUM 9.5 12/20/2011 0334   ALKPHOS 69 12/15/2011 1125   AST 22 12/15/2011 1125   ALT 19 12/15/2011 1125   BILITOT 0.8 12/15/2011 1125    ADDITIONAL INFORMATION: The malignant cells are positive for estrogen receptor, progesterone receptor, cytokeratin 7 and focally positive for CDX-2. They are negative for cytokeratin 20. Given the clinical history as well as the histologic features, the findings are consistent with metastatic endometrioid adenocarcinoma. (JBK:kh 05-19-13) Pecola Leisure MD Pathologist,  Electronic Signature ( Signed 05/19/2013) FINAL DIAGNOSIS Diagnosis Pelvis, biopsy, Central lower - METASTATIC CARCINOMA WITH ASSOCIATED TUMOR NECROSIS. Microscopic Comment Immunostains will be performed and an addendum report will follow. (HCL:kh 05-15-13) Abigail Miyamoto MD Pathologist, Electronic Signature (Case signed 05/15/2013) Specimen Gross and Clinical Information Specimen(s) Obtained: Pelvis, biopsy, Central lower Specimen Clinical Information Recurrent cancer (tl) Gross Received in formalin are fragments and cores of soft white tissue having an aggregate measurement of 1.3 x 0.5 x 0.1 cm. The specimen is submitted in toto. (GRP: ecj 05/14/2013) 1 of 2 Duplicate copy FINAL for Ranker, Dannia P ([id number]) Stain(s) used in Diagnosis: The following stain(s) were used in diagnosing the case: PR - NOACIS, ER - NOACIS, CK-7, CDX-2, CK 20. The control(s) stained appropriately. Disc   RADIOGRAPHIC STUDIES:  Dg Chest 2 View  05/06/2013   *RADIOLOGY REPORT*  Clinical Data: Endometrial cancer  CHEST - 2 VIEW  Comparison: December 15, 2011.  Findings: Cardiomediastinal silhouette appears normal.  No acute pulmonary disease is noted.  Bony thorax is intact.  Stable small dense nodule seen in right lower lobe compared to  prior exam most consistent with granuloma.  IMPRESSION: Stable small nodule seen in right lower lobe most consistent with granuloma.  No acute cardiopulmonary abnormality seen.   Original Report Authenticated By: Lupita Raider.,  M.D.   Ct Abdomen Pelvis W Contrast  05/05/2013   *RADIOLOGY REPORT*  Clinical Data: Pelvic pain.  History of endometrial carcinoma.  CT ABDOMEN AND PELVIS WITH CONTRAST  Technique:  Multidetector CT imaging of the abdomen and pelvis was performed following the standard protocol during bolus administration of intravenous contrast.  Contrast: OMNIPAQUE IOHEXOL 300 MG/ML  SOLN  Comparison: CT 01/05/2006  Findings: Lung bases are clear.  No pericardial fluid.  No focal hepatic lesion.  The gallbladder, pancreas, spleen, adrenal glands, kidneys are normal.  There is a cortical scarring of the right kidney.  No hydronephrosis.  The stomach, small bowel, appendix, cecum are normal.  The colon is normal to the level of the descending colon.  There is a lobular mass at the vaginal cuff measuring 7.0 x 3.2 cm in axial dimension and approximately 4 cm craniocaudad dimension. There is a mass compresses the distal aspect of the sigmoid colon just above the rectum (image 62, axial, image 57, coronal series). No mass approximates the posterior wall the bladder without clear invasion .There is no evidence of obstruction of stool proximal to this mass compressing the sigmoid colon.  There is a large necrotic lymph node measuring 2.8 cm within the sigmoid mesocolon centrally (60).  Patient status post hysterectomy and oophorectomy.  Small bilateral external iliac lymph nodes.  For example 8 mm node on the right and 5 mm node on the left (image 59).  There is no periaortic adenopathy.  No periportal or mesenteric adenopathy  Review of the bone windows demonstrates post lumbar  fusion.  No aggressive osseous lesions.  IMPRESSION:  1.  Lobular mass at the vaginal cuff is consistent with endometrial  carcinoma progression.  2.  Mass extends to the serosal surface of the sigmoid colon and compresses the lumen of the sigmoid colon.  No obstruction at this time.  3.  Mass  approximates the posterior wall of the bladder without clear invasion.  4. Large necrotic lymph node within the sigmoid mesocolon superior to the vaginal cuff.  5.  Small bilateral iliac lymph nodes.   Original Report Authenticated By: Genevive Bi, M.D.   Nm  Pet Image Restag (ps) Skull Base To Thigh  05/09/2013   *RADIOLOGY REPORT*  Clinical Data: Subsequent treatment strategy for endometrial cancer. Hysterectomy 12/19/2011.  Recurrent pelvic masses on the CT.  NUCLEAR MEDICINE PET SKULL BASE TO THIGH  Fasting Blood Glucose:  141  Technique:  18.3 mCi F-18 FDG was injected intravenously. CT data was obtained and used for attenuation correction and anatomic localization only.  (This was not acquired as a diagnostic CT examination.) Additional exam technical data entered on technologist worksheet.  Comparison:  Abdominal pelvic CT 05/05/2013.  Findings:  Neck: No hypermetabolic lymph nodes in the neck.  Chest:  There are no hypermetabolic mediastinal or hilar lymph nodes.  There is no abnormal metabolic activity within the lungs. CT images demonstrate mild atherosclerosis.  There are no suspicious pulmonary findings.  Abdomen/Pelvis:  There is no abnormal metabolic activity within the liver, adrenal glands, spleen or pancreas.  No hypermetabolic lymph nodes are identified within the abdomen.  However, the large lobulated mass contiguous with the vaginal cuff is hypermetabolic. This demonstrates an SUV max of approximately 24.5.  The mass measures up to 6.8 x 3.6 cm transverse on image 186.  The necrotic nodal mass within the sigmoid the mesocolon is also hypermetabolic. This measures 3.1 cm on image 175 and demonstrates an SUV max of 17.1.  The right external iliac and pelvic sidewall lymph nodes demonstrated on the recent CT are also  hypermetabolic.  The largest node measures 2.0 cm on image 176 and has an SUV max of 17.1.  No hypermetabolic left pelvic side wall nodes are identified.  There is no generalized peritoneal hypermetabolic activity or ascites. Scattered activity in the colon is likely physiologic.  The right kidney demonstrates lobularity and scarring.  Skeleton:  No focal hypermetabolic activity to suggest skeletal metastasis.  IMPRESSION:  1.  Multifocal recurrence within the pelvis involving the vaginal cuff, mesenteric and right pelvic sidewall lymph nodes. 2.  No distant metastases identified.   Original Report Authenticated By: Carey Bullocks, M.D.   Ct Biopsy  05/14/2013   *RADIOLOGY REPORT*  Clinical history:67 year old with history of endometrial cancer and suspicious lesions in the pelvis.  PROCEDURE(S): CT GUIDED BIOPSY OF PELVIC MASS  Physician: Rachelle Hora. Henn, MD  Medications:Versed 2 mg, Fentanyl 100 mcg.  A radiology nurse monitored the patient for moderate sedation.  Moderate sedation time:28 minutes  Procedure:The procedure was explained to the patient.  The risks and benefits of the procedure were discussed and the patient's questions were addressed.  Informed consent was obtained from the patient.  The patient was placed prone on the CT scanner.  Images of the pelvis were obtained.  The left gluteal area was prepped and draped in a sterile fashion.  Skin and soft tissues were anesthetized with lidocaine.  17 gauge needle was directed into the pelvic mass from a left transgluteal approach.  Needle position confirmed along the posterior aspect of the lesion.  Three core biopsies were obtained with an 18 gauge device.  Samples placed in formalin.  17 gauge needle was removed without complication.  Findings:There is a pelvic mass situated adjacent to the vaginal cuff.  The needle was positioned along the posterior aspect of the lesion.  Complications: None  Impression:CT guided core biopsies of the pelvic mass.    Original Report Authenticated By: Richarda Overlie, M.D.    ASSESSMENT: 67 year old female with  #1 recurrent endometrioid adenocarcinoma of the uterus. Patient is gong to receive palliative chemotherapy consisting of Taxol and  carboplatinum. I discussed this with the patient and her daughters. We discussed the rationale for this. We discussed risks benefits and potential side effects.  #2 patient will need a Port-A-Cath placed and I will have this performed by interventional radiology.  #3 she will also need chemotherapy teaching class.   PLAN:  #1 we discussed the pathology results as well as PET scan results today.  #2 we discussed chemotherapy treatment with Taxol and carboplatinum. We discussed that chemotherapy will be given every 3 weeks for a total of 6-8 cycles.  #3 we do need a Port-A-Cath placed and I have consulted interventional radiology to do this for [place].  #4 you'll need chemotherapy teaching class and I will get this scheduled.  #5 plan is to begin you on chemotherapy on 06/03/2013.     All questions were answered. The patient knows to call the clinic with any problems, questions or concerns. We can certainly see the patient much sooner if necessary.  I spent 25 minutes counseling the patient face to face. The total time spent in the appointment was 30 minutes.    Drue Second, MD Medical/Oncology [facility] [phone] (beeper) [phone] (Office)  05/20/2013, 10:33 AM

## 2013-05-20 NOTE — Telephone Encounter (Signed)
Per staff message and POF I have scheduled appts.  JMW  

## 2013-05-20 NOTE — Telephone Encounter (Signed)
appts made and printed. i noticed that MW scheduled the tx for the same time as the ov on 7/22. i emailed MW to adjust that time. i made the pt aware that the time will be adjusted. Per KK to place the pt on LA schedule for 06/03/13....td

## 2013-05-21 ENCOUNTER — Encounter (HOSPITAL_COMMUNITY): Payer: Self-pay | Admitting: Pharmacy Technician

## 2013-05-21 ENCOUNTER — Other Ambulatory Visit: Payer: Self-pay | Admitting: Radiology

## 2013-05-22 ENCOUNTER — Other Ambulatory Visit: Payer: Medicare Other

## 2013-05-22 ENCOUNTER — Encounter: Payer: Self-pay | Admitting: *Deleted

## 2013-05-23 ENCOUNTER — Encounter (HOSPITAL_COMMUNITY): Payer: Self-pay

## 2013-05-23 ENCOUNTER — Other Ambulatory Visit: Payer: Self-pay | Admitting: Oncology

## 2013-05-23 ENCOUNTER — Ambulatory Visit (HOSPITAL_COMMUNITY)
Admission: RE | Admit: 2013-05-23 | Discharge: 2013-05-23 | Disposition: A | Discharge status: Home / Self Care | Payer: Medicare Other | Source: Ambulatory Visit | Attending: Oncology | Admitting: Oncology

## 2013-05-23 DIAGNOSIS — C801 Malignant (primary) neoplasm, unspecified: Secondary | ICD-10-CM | POA: Insufficient documentation

## 2013-05-23 DIAGNOSIS — C549 Malignant neoplasm of corpus uteri, unspecified: Secondary | ICD-10-CM | POA: Insufficient documentation

## 2013-05-23 DIAGNOSIS — I10 Essential (primary) hypertension: Secondary | ICD-10-CM | POA: Insufficient documentation

## 2013-05-23 DIAGNOSIS — E119 Type 2 diabetes mellitus without complications: Secondary | ICD-10-CM | POA: Insufficient documentation

## 2013-05-23 DIAGNOSIS — C541 Malignant neoplasm of endometrium: Secondary | ICD-10-CM

## 2013-05-23 LAB — PROTIME-INR
INR: 0.94 (ref 0.00–1.49)
Prothrombin Time: 12.4 seconds (ref 11.6–15.2)

## 2013-05-23 LAB — CBC WITH DIFFERENTIAL/PLATELET
Basophils Relative: 0 % (ref 0–1)
Eosinophils Absolute: 0.1 10*3/uL (ref 0.0–0.7)
Hemoglobin: 12.9 g/dL (ref 12.0–15.0)
Lymphs Abs: 1.5 10*3/uL (ref 0.7–4.0)
Monocytes Relative: 7 % (ref 3–12)
Neutro Abs: 7.2 10*3/uL (ref 1.7–7.7)
Neutrophils Relative %: 75 % (ref 43–77)
Platelets: 297 10*3/uL (ref 150–400)
RBC: 4.14 MIL/uL (ref 3.87–5.11)

## 2013-05-23 LAB — GLUCOSE, CAPILLARY: Glucose-Capillary: 144 mg/dL — ABNORMAL HIGH (ref 70–99)

## 2013-05-23 MED ORDER — HEPARIN SOD (PORK) LOCK FLUSH 100 UNIT/ML IV SOLN
500.0000 [IU] | Freq: Once | INTRAVENOUS | Status: AC
  Administered 2013-05-23: 500 [IU] via INTRAVENOUS

## 2013-05-23 MED ORDER — MIDAZOLAM HCL 2 MG/2ML IJ SOLN
INTRAMUSCULAR | Status: AC
  Filled 2013-05-23: qty 4

## 2013-05-23 MED ORDER — SODIUM CHLORIDE 0.9 % IV SOLN
INTRAVENOUS | Status: DC
  Administered 2013-05-23: 20 mL/h via INTRAVENOUS

## 2013-05-23 MED ORDER — CEFAZOLIN SODIUM-DEXTROSE 2-3 GM-% IV SOLR
2.0000 g | Freq: Once | INTRAVENOUS | Status: AC
  Administered 2013-05-23: 2 g via INTRAVENOUS
  Filled 2013-05-23: qty 50

## 2013-05-23 MED ORDER — FENTANYL CITRATE 0.05 MG/ML IJ SOLN
INTRAMUSCULAR | Status: AC
  Filled 2013-05-23: qty 4

## 2013-05-23 MED ORDER — MIDAZOLAM HCL 2 MG/2ML IJ SOLN
INTRAMUSCULAR | Status: AC | PRN
  Administered 2013-05-23 (×2): 1 mg via INTRAVENOUS

## 2013-05-23 MED ORDER — FENTANYL CITRATE 0.05 MG/ML IJ SOLN
INTRAMUSCULAR | Status: AC | PRN
  Administered 2013-05-23: 50 ug via INTRAVENOUS

## 2013-05-23 MED ORDER — LIDOCAINE HCL 1 % IJ SOLN
INTRAMUSCULAR | Status: AC
  Filled 2013-05-23: qty 20

## 2013-05-23 MED ORDER — HYDROMORPHONE HCL PF 2 MG/ML IJ SOLN
INTRAMUSCULAR | Status: AC
  Filled 2013-05-23: qty 1

## 2013-05-23 MED ORDER — HYDROMORPHONE HCL PF 1 MG/ML IJ SOLN
INTRAMUSCULAR | Status: AC | PRN
  Administered 2013-05-23: 2 mg via INTRAVENOUS

## 2013-05-23 NOTE — Procedures (Signed)
Procedure:  Right IJ port SL PAC via right IJ vein.  Cath tip at CAJ.  No PTX.

## 2013-05-23 NOTE — H&P (Signed)
Agree 

## 2013-05-23 NOTE — H&P (Signed)
Chief Complaint: "I am here for a port catheter placement" Referring Physician: Dr. Welton Flakes HPI: Pam Vazquez is an 67 y.o. female who had a recent pelvic mass biopsy which showed metastatic endometrial cancer. Patient presents today for port catheter placement to undergo chemotherapy. She denies any recent illness, fever or chills. She denies any chest pain or shortness of breath. She denies any blood in her stool or urine.   Past Medical History:  Past Medical History  Diagnosis Date  . Diabetes mellitus   . PONV (postoperative nausea and vomiting)   . Cancer     endometrial  . Angina     stress test on chart from 3/12/ none since March 2012  . Chronic kidney disease     kidney stones  . Hypertension     / EKG 1/13 Epic    Past Surgical History:  Past Surgical History  Procedure Laterality Date  . Hysteroscopy w/d&c  11/21/2011    Procedure: DILATATION AND CURETTAGE /HYSTEROSCOPY;  Surgeon: Miguel Aschoff;  Location: [facility];  Service: Gynecology;  Laterality: N/A;  . Tubal ligation    . Breast mass removal  1984, 1992    L breast  . Lithotripsy  2000, 2002  . Back surgery      91 and 2001  . Dilation and curettage of uterus    . Node dissection  12/19/2011    Procedure: NODE DISSECTION;  Surgeon: Laurette Schimke, MD PHD;  Location: [facility];  Service: Gynecology;  Laterality: N/A;    Family History:  Family History  Problem Relation Age of Onset  . Heart disease Mother   . Heart attack Mother     Social History:  reports that she quit smoking about 27 years ago. She does not have any smokeless tobacco history on file. She reports that she does not drink alcohol or use illicit drugs.  Allergies: No Known Allergies  Medications:   Medication List    ASK your doctor about these medications       amLODipine 2.5 MG tablet  Commonly known as:  NORVASC  Take 2.5 mg by mouth every morning.     cholecalciferol 1000 UNITS tablet  Commonly known as:  VITAMIN D  Take 1,000 Units  by mouth every morning.     cyanocobalamin 2000 MCG tablet  Take 2,000 mcg by mouth 2 (two) times daily.     dexamethasone 4 MG tablet  Commonly known as:  DECADRON  Take 2 tablets (8 mg total) by mouth 2 (two) times daily with a meal. Take two times a day starting the day after chemotherapy for 3 days.     doxycycline 100 MG tablet  Commonly known as:  VIBRA-TABS  Take 100 mg by mouth every other day.     Fish Oil 1200 MG Caps  Take 1,200 mg by mouth every morning.     lidocaine-prilocaine cream  Commonly known as:  EMLA  Apply topically as needed.     lisinopril-hydrochlorothiazide 20-25 MG per tablet  Commonly known as:  PRINZIDE,ZESTORETIC  Take 1 tablet by mouth daily after breakfast.     LORazepam 0.5 MG tablet  Commonly known as:  ATIVAN  Take 1 tablet (0.5 mg total) by mouth every 6 (six) hours as needed (Nausea or vomiting).     Magnesium 250 MG Tabs  Take 1 tablet by mouth at bedtime.     metFORMIN 500 MG tablet  Commonly known as:  GLUCOPHAGE  Take 500 mg by mouth  every morning.     metoprolol tartrate 25 MG tablet  Commonly known as:  LOPRESSOR  Take 25 mg by mouth 2 (two) times daily.     multivitamin with minerals Tabs  Take 1 tablet by mouth every morning. She takes Surveyor, quantity.     ondansetron 8 MG tablet  Commonly known as:  ZOFRAN  Take 1 tablet (8 mg total) by mouth 2 (two) times daily. Take two times a day starting the day after chemo for 3 days. Then take two times a day as needed for nausea or vomiting.     Oxycodone HCl 10 MG Tabs  Take 1 tablet (10 mg total) by mouth every 4 (four) hours as needed (for moderate to severe pain).     prochlorperazine 10 MG tablet  Commonly known as:  COMPAZINE  Take 1 tablet (10 mg total) by mouth every 6 (six) hours as needed (Nausea or vomiting).     prochlorperazine 25 MG suppository  Commonly known as:  COMPAZINE  Place 1 suppository (25 mg total) rectally every 12 (twelve) hours as needed for  nausea.       Please HPI for pertinent positives, otherwise complete 10 system ROS negative.  Physical Exam: BP 139/62  Pulse 77  Temp(Src) 98.5 F (36.9 C) (Oral)  Resp 16  Ht 5' (1.524 m)  Wt 158 lb (71.668 kg)  BMI 30.86 kg/m2  SpO2 100% Body mass index is 30.86 kg/(m^2).   General Appearance:  Alert, cooperative, no distress, appears stated age  Head:  Normocephalic, without obvious abnormality, atraumatic  ENT: Unremarkable  Neck: Supple, symmetrical, trachea midline  Lungs:   Clear to auscultation bilaterally, no w/r/r, respirations unlabored without use of accessory muscles.  Chest Wall:  No tenderness or deformity  Heart:  Regular rate and rhythm, S1, S2 normal, no murmur, rub or gallop.  Abdomen:   Soft, non-tender, non distended.  Extremities: Extremities normal, atraumatic, no cyanosis or edema  Pulses: 2+ and symmetric  Neurologic: Normal affect, no gross deficits.   Results for orders placed during the hospital encounter of 05/23/13 (from the past 48 hour(s))  CBC WITH DIFFERENTIAL     Status: None   Collection Time    05/23/13  7:35 AM      Result Value Range   WBC 9.5  4.0 - 10.5 K/uL   RBC 4.14  3.87 - 5.11 MIL/uL   Hemoglobin 12.9  12.0 - 15.0 g/dL   HCT 40.9  81.1 - 91.4 %   MCV 91.1  78.0 - 100.0 fL   MCH 31.2  26.0 - 34.0 pg   MCHC 34.2  30.0 - 36.0 g/dL   RDW 78.2  95.6 - 21.3 %   Platelets 297  150 - 400 K/uL   Neutrophils Relative % 75  43 - 77 %   Neutro Abs 7.2  1.7 - 7.7 K/uL   Lymphocytes Relative 16  12 - 46 %   Lymphs Abs 1.5  0.7 - 4.0 K/uL   Monocytes Relative 7  3 - 12 %   Monocytes Absolute 0.7  0.1 - 1.0 K/uL   Eosinophils Relative 1  0 - 5 %   Eosinophils Absolute 0.1  0.0 - 0.7 K/uL   Basophils Relative 0  0 - 1 %   Basophils Absolute 0.0  0.0 - 0.1 K/uL  PROTIME-INR     Status: None   Collection Time    05/23/13  7:35 AM  Result Value Range   Prothrombin Time 12.4  11.6 - 15.2 seconds   INR 0.94  0.00 - 1.49   No  results found.  Assessment/Plan Patient had recent pelvic mass biopsy showed recurrence of endometrial Cancer. Patient is here today for port-a-cath placement for chemotherapy. Labs reviewed. VSS. Risks and benefits of procedure discussed with patient and family, all questions were answered. Consent signed and in chart.  Pattricia Boss D PA-C 05/23/2013, 8:45 AM

## 2013-05-26 ENCOUNTER — Other Ambulatory Visit: Payer: Self-pay | Admitting: Emergency Medicine

## 2013-05-26 ENCOUNTER — Other Ambulatory Visit: Payer: Self-pay | Admitting: Gynecologic Oncology

## 2013-05-26 DIAGNOSIS — R109 Unspecified abdominal pain: Secondary | ICD-10-CM

## 2013-05-26 MED ORDER — OXYCODONE HCL 10 MG PO TABS: 10.0000 mg | ORAL_TABLET | ORAL | Status: DC | PRN

## 2013-05-26 NOTE — Progress Notes (Signed)
Patient called requesting a refill on pain medication.  Reporting moderate abdominal pain with pain around 6 or 7.  Reportable signs and symptoms reviewed.  Patient to pick up prescription tomorrow.  Instructed to call for any questions or concerns.

## 2013-05-29 ENCOUNTER — Ambulatory Visit: Payer: Medicare Other | Admitting: Gynecologic Oncology

## 2013-06-03 ENCOUNTER — Ambulatory Visit (HOSPITAL_BASED_OUTPATIENT_CLINIC_OR_DEPARTMENT_OTHER): Payer: Medicare Other | Admitting: Adult Health

## 2013-06-03 ENCOUNTER — Encounter: Payer: Self-pay | Admitting: Oncology

## 2013-06-03 ENCOUNTER — Other Ambulatory Visit (HOSPITAL_BASED_OUTPATIENT_CLINIC_OR_DEPARTMENT_OTHER): Payer: Medicare Other | Admitting: Lab

## 2013-06-03 ENCOUNTER — Other Ambulatory Visit: Payer: Medicare Other | Admitting: Lab

## 2013-06-03 ENCOUNTER — Encounter: Payer: Self-pay | Admitting: Adult Health

## 2013-06-03 ENCOUNTER — Ambulatory Visit (HOSPITAL_BASED_OUTPATIENT_CLINIC_OR_DEPARTMENT_OTHER): Payer: Medicare Other

## 2013-06-03 VITALS — BP 109/65 | HR 86 | Temp 98.0°F | Resp 20 | Ht 60.0 in | Wt 154.4 lb

## 2013-06-03 VITALS — BP 123/68 | HR 79 | Temp 98.4°F | Resp 18

## 2013-06-03 DIAGNOSIS — C541 Malignant neoplasm of endometrium: Secondary | ICD-10-CM

## 2013-06-03 DIAGNOSIS — R6881 Early satiety: Secondary | ICD-10-CM

## 2013-06-03 DIAGNOSIS — G893 Neoplasm related pain (acute) (chronic): Secondary | ICD-10-CM

## 2013-06-03 DIAGNOSIS — C549 Malignant neoplasm of corpus uteri, unspecified: Secondary | ICD-10-CM

## 2013-06-03 DIAGNOSIS — Z5111 Encounter for antineoplastic chemotherapy: Secondary | ICD-10-CM

## 2013-06-03 DIAGNOSIS — R109 Unspecified abdominal pain: Secondary | ICD-10-CM

## 2013-06-03 LAB — COMPREHENSIVE METABOLIC PANEL (CC13)
ALT: 14 U/L (ref 0–55)
AST: 19 U/L (ref 5–34)
Alkaline Phosphatase: 80 U/L (ref 40–150)
Creatinine: 1 mg/dL (ref 0.6–1.1)
Sodium: 137 mEq/L (ref 136–145)
Total Bilirubin: 0.42 mg/dL (ref 0.20–1.20)
Total Protein: 7.1 g/dL (ref 6.4–8.3)

## 2013-06-03 LAB — CBC WITH DIFFERENTIAL/PLATELET
BASO%: 0.3 % (ref 0.0–2.0)
EOS%: 2.8 % (ref 0.0–7.0)
Eosinophils Absolute: 0.4 10*3/uL (ref 0.0–0.5)
LYMPH%: 12.7 % — ABNORMAL LOW (ref 14.0–49.7)
MCHC: 33.2 g/dL (ref 31.5–36.0)
MCV: 92.5 fL (ref 79.5–101.0)
MONO%: 6.2 % (ref 0.0–14.0)
NEUT#: 10.5 10*3/uL — ABNORMAL HIGH (ref 1.5–6.5)
Platelets: 253 10*3/uL (ref 145–400)
RBC: 4 10*6/uL (ref 3.70–5.45)
RDW: 12.9 % (ref 11.2–14.5)
nRBC: 0 % (ref 0–0)

## 2013-06-03 MED ORDER — DEXAMETHASONE SODIUM PHOSPHATE 20 MG/5ML IJ SOLN
20.0000 mg | Freq: Once | INTRAMUSCULAR | Status: AC
  Administered 2013-06-03: 20 mg via INTRAVENOUS

## 2013-06-03 MED ORDER — SODIUM CHLORIDE 0.9 % IJ SOLN
10.0000 mL | INTRAMUSCULAR | Status: DC | PRN
  Administered 2013-06-03: 10 mL
  Filled 2013-06-03: qty 10

## 2013-06-03 MED ORDER — DIPHENHYDRAMINE HCL 50 MG/ML IJ SOLN
50.0000 mg | Freq: Once | INTRAMUSCULAR | Status: AC
  Administered 2013-06-03: 50 mg via INTRAVENOUS

## 2013-06-03 MED ORDER — FAMOTIDINE IN NACL 20-0.9 MG/50ML-% IV SOLN: 20.0000 mg | Freq: Once | INTRAVENOUS | Status: DC

## 2013-06-03 MED ORDER — SODIUM CHLORIDE 0.9 % IV SOLN
530.0000 mg | Freq: Once | INTRAVENOUS | Status: AC
  Administered 2013-06-03: 530 mg via INTRAVENOUS
  Filled 2013-06-03: qty 53

## 2013-06-03 MED ORDER — PACLITAXEL CHEMO INJECTION 300 MG/50ML
175.0000 mg/m2 | Freq: Once | INTRAVENOUS | Status: AC
  Administered 2013-06-03: 306 mg via INTRAVENOUS
  Filled 2013-06-03: qty 51

## 2013-06-03 MED ORDER — OXYCODONE HCL 10 MG PO TABS: 10.0000 mg | ORAL_TABLET | ORAL | Status: DC | PRN

## 2013-06-03 MED ORDER — FENTANYL 25 MCG/HR TD PT72: 1.0000 | MEDICATED_PATCH | TRANSDERMAL | Status: DC

## 2013-06-03 MED ORDER — ONDANSETRON 16 MG/50ML IVPB (CHCC)
16.0000 mg | Freq: Once | INTRAVENOUS | Status: AC
  Administered 2013-06-03: 16 mg via INTRAVENOUS

## 2013-06-03 MED ORDER — SODIUM CHLORIDE 0.9 % IV SOLN
50.0000 mg | Freq: Once | INTRAVENOUS | Status: AC
  Administered 2013-06-03: 50 mg via INTRAVENOUS
  Filled 2013-06-03: qty 2

## 2013-06-03 MED ORDER — SODIUM CHLORIDE 0.9 % IV SOLN
Freq: Once | INTRAVENOUS | Status: AC
  Administered 2013-06-03: 13:00:00 via INTRAVENOUS

## 2013-06-03 MED ORDER — HEPARIN SOD (PORK) LOCK FLUSH 100 UNIT/ML IV SOLN
500.0000 [IU] | Freq: Once | INTRAVENOUS | Status: AC | PRN
  Administered 2013-06-03: 500 [IU]
  Filled 2013-06-03: qty 5

## 2013-06-03 NOTE — Patient Instructions (Addendum)
Doing well.  Proceed with chemotherapy.  Please call us if you have any questions or concerns.   fentanyl skin patch What is this medicine? FENTANYL (FEN ta nil) is a pain reliever. It is used to treat persistent, moderate to severe chronic pain. It is used only by people who have been taking an opioid or narcotic pain medicine for more than one week. This medicine may be used for other purposes; ask your health care provider or pharmacist if you have questions. What should I tell my health care provider before I take this medicine? They need to know if you have any of these conditions: -brain tumor -Crohn's disease, inflammatory bowel disease, or ulcerative colitis -drug abuse or addiction -head injury -heart disease -if you frequently drink alcohol containing drinks -kidney disease or problems going to the bathroom -liver disease -lung disease, asthma, or breathing problems -mental problems -skin problems -taken isocarboxazid, phenelzine, tranylcypromine, or selegiline in the past 2 weeks -an allergic or unusual reaction to fentanyl, meperidine, other medicines, foods, dyes, or preservatives -pregnant or trying to get pregnant -breast-feeding How should I use this medicine? Apply the patch to your skin. Do not cut or damage the patch. A cut or damaged patch can be very dangerous because you may get too much medicine. Select a clean, dry area of skin above your waist on your front or back. The upper back is a good spot to put the patch on children or people who are confused because it will be hard for them to remove the patch. Do not apply the patch to oily, broken, burned, cut, or irritated skin. Use only water to clean the area. Do not use soap or alcohol to clean the skin because this can increase the effects of the medicine. If the area is hairy, clip the hair with scissors, but do not shave. Take the patch out of its wrapper, and take off the protective strip over the sticky part. Do  not use a patch if the packaging or backing is damaged. Do not touch the sticky part with your fingers. Press the sticky surface to the skin using the palm of your hand. Press the patch to the skin for 30 seconds. Wash your hands at once with soap and water. Take off the old patch before putting on a new patch. Apply each new patch to a different area of skin. If a patch comes off or causes irritation, remove it and apply a new patch to different site. To get rid of used patches, fold the patch in half with the sticky sides together. Then, flush it down the toilet. Do not discard the patch in the garbage. Pets and children can be harmed if they find used or lost patches. Replace the patch every 3 days or as directed by your doctor or health care professional. Follow the directions on the prescription label. Do not take more medicine than you are told to take. A special MedGuide will be given to you by the pharmacist with each prescription and refill. Be sure to read this information carefully each time. Talk to your pediatrician regarding the use of this medicine in children. While this drug may be prescribed for children as young as 65 years old for selected conditions, precautions do apply. If someone accidentally uses a fentanyl patch and is not awake and alert, immediately call 911 for help. If the person is awake and alert, call a doctor, health care professional, or the [employer]. Overdosage: If you  think you have taken too much of this medicine contact a poison control center or emergency room at once. NOTE: This medicine is only for you. Do not share this medicine with others. What if I miss a dose? If you forget to replace your patch, take off the old patch and put on a new patch as soon as you can. Do not apply an extra patch to your skin. Do not wear more than one patch at the same time unless told to do so by your doctor or health care professional. What may interact with this  medicine? -alcohol or medicines that contain alcohol -antihistamines -clarithromycin -diltiazem -erythromycin -herbal products that contain [place]. John's wort -itraconazole -ketoconazole -medicines for depression, anxiety, or psychotic disturbances -medicines for pain like pentazocine, buprenorphine, butorphanol, nalbuphine, tramadol, and propoxyphene -medicines for sleep -muscle relaxants -naltrexone -nelfinavir -nicardipine -phenobarbital, phenytoin, or fosphenytoin -rifampin -ritonavir -troleandomycin -verapamil This list may not describe all possible interactions. Give your health care provider a list of all the medicines, herbs, non-prescription drugs, or dietary supplements you use. Also tell them if you smoke, drink alcohol, or use illegal drugs. Some items may interact with your medicine. What should I watch for while using this medicine? Other pain medicine may be needed the first day you use the patch because the patch can take some time to start working. Tell your doctor or health care professional if your pain does not go away, if it gets worse, or if you have new or a different type of pain. You may develop tolerance to the medicine. Tolerance means that you will need a higher dose of the medicine for pain relief. Tolerance is normal and is expected if you take the medicine for a long time. Do not suddenly stop taking your medicine because you may develop a severe reaction. Your body becomes used to the medicine. This does NOT mean you are addicted. Addiction is a behavior related to getting and using a drug for a non-medical reason. If you have pain, you have a medical reason to take pain medicine. Your doctor will tell you how much medicine to take. If your doctor wants you to stop the medicine, the dose will be slowly lowered over time to avoid any side effects. You may get drowsy or dizzy when you first start taking the medicine or change doses. Do not drive, use machinery, or  do anything that may be dangerous until you know how the medicine affects you. Stand or sit up slowly. The medicine will cause constipation. Try to have a bowel movement at least every 2 to 3 days. If you do not have a bowel movement for 3 days, call your doctor or health care professional. Your mouth may get dry. Drinking water, chewing sugarless gum, or sucking on hard candy may help. See your dentist every 6 months. Heat can increase the amount of medicine released from the patch. Do not get the patch hot by using heating pads, heated water beds, electric blankets, and heat lamps. You can bathe or swim while using the patch. But, do not use a sauna or hot tub. Tell you doctor or health care professional if you get a fever. If gel leaks from the patch, wash your skin well with water only. Do not use soap or cleansers containing alcohol. Remove the patch from your skin and discard as instructed above. Medicine from a partly attached or leaking patch can transfer to a child or pet when they are being held. Exposure of  children or pets to the patch can cause serious side effects and even death. If you are going to have a magnetic resonance imaging (MRI) procedure, tell your MRI technician if you have this patch on your body. It must be removed before a MRI. What side effects may I notice from receiving this medicine? Side effects that you should report to your doctor or health care professional as soon as possible: -allergic reactions like skin rash, itching or hives, swelling of the face, lips, or tongue -breathing problems -changes in vision -confusion -feeling faint, lightheaded -fever, flu-like symptoms -hallucination -high or low blood pressure -irregular heartbeat -problems with balance, talking, walking -trouble passing urine or change in the amount of urine -unusual bleeding or bruising -unusually weak or tired Side effects that usually do not require medical attention (report to your  doctor or health care professional if they continue or are bothersome): -constipation -dry mouth -itching where the patch was applied -loss of appetite -nausea, vomiting -sweating This list may not describe all possible side effects. Call your doctor for medical advice about side effects. You may report side effects to FDA at 1-800-FDA-1088. Where should I keep my medicine? Keep out of the reach of children. This medicine can be abused. Keep your medicine in a safe place to protect it from theft. Do not share this medicine with anyone. Selling or giving away this medicine is dangerous and against the law. Store below 77 degrees F (25 degrees C). Do not store the patches out of their wrappers. Flush any unused medicines down the toilet as instructed above. Do not use the medicine after the expiration date. NOTE: This sheet is a summary. It may not cover all possible information. If you have questions about this medicine, talk to your doctor, pharmacist, or health care provider.  2012, [employer]. (06/12/2011 10:50:57 AM)

## 2013-06-03 NOTE — Progress Notes (Signed)
OFFICE PROGRESS NOTE  CC  [name],[name], MD [address], [place] [address] Dr. Emmaline Kluver Dr. 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 She had undergone a robotic assisted total laparoscopic hysterectomy bilateral salpingo-oophorectomy right pelvic lymph node biopsy on 12/19/2011 without any complications. 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 There was no involvement of other organs lymphovascular invasion was not identified. 3 lymph nodes were negative for metastatic disease. 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 Patient subsequently continued to be seen by gynecologic oncology. In October 2013 patient had a Pap smear that was normal limits there was noted to be some granulation tissue that was removed from the cough. There is no evidence of disease.  #2patient was seen by Dr. Zenda Alpers on 04/29/2013 she had vaginal exam performed that was notable for a nodule in the vagina concerning for recurrence. A rectal examination revealed a firm 8 cm mass also. Because of this she had a biopsy performed. She also had a CT scan of the abdomen and pelvis ordered to further evaluate the sigmoid mass. The pathology came back inconclusive. However the CT of the abdomen and pelvis showed a lobular mass at the vagina cough measuring 7.0 x 3.2 cm proximally 4 cm craniocaudal dimension. The mass compressed the distal aspect of the sigmoid colon just above the rectum there was no mass approximates the posterior wall of the bladder without clear invasion noted evidence of  obstruction of stool proximal to this mass. There was also noted to be a large necrotic lymph node measuring 2.8 cm within the sigmoid mesocolon centrally. There are also small bilateral external iliac lymph nodes. There were no aggressive osseous lesions.   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  CURRENT THERAPY: Taxol/Carbo cycle 1 day 1  INTERVAL HISTORY: Pam Vazquez 67 y.o. female returns for followup visit to start chemotherapy.  She will start Taxol/Carbo today.  She is doing well.  Her only complaint is pain/pressure from the tumor.  She is taking Oxycodone 10mg  every 3-4 hours as needed, which is pretty much around the clock.  She is nervous about starting chemotherapy, however denies fevers, chills, or any further concerns.  A 10 point ROS is otherwise negative.   MEDICAL HISTORY: Past Medical History  Diagnosis Date  . Diabetes mellitus   . PONV (postoperative nausea and vomiting)   . Cancer     endometrial  . Angina     stress test on chart from 3/12/ none since March 2012  . Chronic kidney disease     kidney stones  . Hypertension     / EKG 1/13 Epic    ALLERGIES:  has No Known Allergies.  MEDICATIONS:  Current Outpatient Prescriptions  Medication Sig Dispense Refill  . amLODipine (NORVASC) 2.5 MG tablet Take 2.5 mg by mouth every morning.       . cyanocobalamin 2000 MCG tablet Take 2,000 mcg by mouth 2 (two) times daily.      [name] doxycycline (VIBRA-TABS) 100 MG tablet Take 100 mg by mouth every other day.      [name]  lidocaine-prilocaine (EMLA) cream Apply topically as needed.  30 g  6  . lisinopril-hydrochlorothiazide (PRINZIDE,ZESTORETIC) 20-25 MG per tablet Take 1 tablet by mouth daily after breakfast.       . metFORMIN (GLUCOPHAGE) 500 MG tablet Take 500 mg by mouth every morning.       . metoprolol tartrate (LOPRESSOR) 25 MG tablet Take 25 mg by mouth 2 (two) times  daily.       . Multiple Vitamin (MULTIVITAMIN WITH MINERALS) TABS Take 1 tablet by mouth every morning. She takes Surveyor, quantity.      . Oxycodone HCl 10 MG TABS Take 1 tablet (10 mg total) by mouth every 4 (four) hours as needed (for moderate to severe pain).  80 tablet  0  . cholecalciferol (VITAMIN D) 1000 UNITS tablet Take 1,000 Units by mouth every morning.       [name] dexamethasone (DECADRON) 4 MG tablet Take 2 tablets (8 mg total) by mouth 2 (two) times daily with a meal. Take two times a day starting the day after chemotherapy for 3 days.  30 tablet  1  . fentaNYL (DURAGESIC - DOSED MCG/HR) 25 MCG/HR Place 1 patch (25 mcg total) onto the skin every 3 (three) days.  10 patch  0  . LORazepam (ATIVAN) 0.5 MG tablet Take 1 tablet (0.5 mg total) by mouth every 6 (six) hours as needed (Nausea or vomiting).  30 tablet  0  . Magnesium 250 MG TABS Take 1 tablet by mouth at bedtime.       . Omega-3 Fatty Acids (FISH OIL) 1200 MG CAPS Take 1,200 mg by mouth every morning.       . ondansetron (ZOFRAN) 8 MG tablet Take 1 tablet (8 mg total) by mouth 2 (two) times daily. Take two times a day starting the day after chemo for 3 days. Then take two times a day as needed for nausea or vomiting.  30 tablet  1  . prochlorperazine (COMPAZINE) 10 MG tablet Take 1 tablet (10 mg total) by mouth every 6 (six) hours as needed (Nausea or vomiting).  30 tablet  1  . prochlorperazine (COMPAZINE) 25 MG suppository Place 1 suppository (25 mg total) rectally every 12 (twelve) hours as needed for nausea.  12 suppository  3   No current facility-administered medications for this visit.   Facility-Administered Medications Ordered in Other Visits  Medication Dose Route Frequency Provider Last Rate Last Dose  . heparin lock flush 100 unit/mL  500 Units Intracatheter Once PRN Augustin Schooling, NP      . PACLitaxel (TAXOL) 306 mg in dextrose 5 % 500 mL chemo infusion (> 80mg /m2)  175 mg/m2 (Treatment Plan Actual) Intravenous Once  Augustin Schooling, NP 100 mL/hr at 06/03/13 1451 306 mg at 06/03/13 1451  . sodium chloride 0.9 % injection 10 mL  10 mL Intracatheter PRN Augustin Schooling, NP        SURGICAL HISTORY:  Past Surgical History  Procedure Laterality Date  . Hysteroscopy w/d&c  11/21/2011    Procedure: DILATATION AND CURETTAGE /HYSTEROSCOPY;  Surgeon: Miguel Aschoff;  Location: [facility];  Service: Gynecology;  Laterality: N/A;  . Tubal ligation    . Breast mass removal  1984, 1992    L breast  . Lithotripsy  2000, 2002  . Back surgery      91 and 2001  . Dilation and curettage of uterus    . Node dissection  12/19/2011    Procedure: NODE DISSECTION;  Surgeon: Laurette Schimke, MD  PHD;  Location: [facility];  Service: Gynecology;  Laterality: N/A;    REVIEW OF SYSTEMS:  Pertinent items are noted in HPI.   PHYSICAL EXAMINATION: Blood pressure 109/65, pulse 86, temperature 98 F (36.7 C), temperature source Oral, resp. rate 20, height 5' (1.524 m), weight 154 lb 6.4 oz (70.035 kg). Body mass index is 30.15 kg/(m^2). General: Patient is a well appearing female in no acute distress HEENT: PERRLA, sclerae anicteric no conjunctival pallor, MMM Neck: supple, no palpable adenopathy Lungs: clear to auscultation bilaterally, no wheezes, rhonchi, or rales Cardiovascular: regular rate rhythm, S1, S2, no murmurs, rubs or gallops Abdomen: Soft, non-tender, non-distended, normoactive bowel sounds, no HSM Extremities: warm and well perfused, no clubbing, cyanosis, or edema Skin: No rashes or lesions Neuro: Non-focal ECOG PERFORMANCE STATUS: 0 - Asymptomatic  LABORATORY DATA: Lab Results  Component Value Date   WBC 13.4* 06/03/2013   HGB 12.3 06/03/2013   HCT 37.0 06/03/2013   MCV 92.5 06/03/2013   PLT 253 06/03/2013      Chemistry      Component Value Date/Time   NA 137 06/03/2013 1030   NA 141 12/20/2011 0334   K 4.0 06/03/2013 1030   K 4.1 12/20/2011 0334   CL 104 04/29/2013 1229   CL 104 12/20/2011 0334   CO2 29 06/03/2013  1030   CO2 27 12/20/2011 0334   BUN 14.1 06/03/2013 1030   BUN 10 12/20/2011 0334   CREATININE 1.0 06/03/2013 1030   CREATININE 0.80 12/20/2011 0334      Component Value Date/Time   CALCIUM 10.7* 06/03/2013 1030   CALCIUM 9.5 12/20/2011 0334   ALKPHOS 80 06/03/2013 1030   ALKPHOS 69 12/15/2011 1125   AST 19 06/03/2013 1030   AST 22 12/15/2011 1125   ALT 14 06/03/2013 1030   ALT 19 12/15/2011 1125   BILITOT 0.42 06/03/2013 1030   BILITOT 0.8 12/15/2011 1125    ADDITIONAL INFORMATION: The malignant cells are positive for estrogen receptor, progesterone receptor, cytokeratin 7 and focally positive for CDX-2. They are negative for cytokeratin 20. Given the clinical history as well as the histologic features, the findings are consistent with metastatic endometrioid adenocarcinoma. (JBK:kh 05-19-13) Pecola Leisure MD Pathologist, Electronic Signature ( Signed 05/19/2013) FINAL DIAGNOSIS Diagnosis Pelvis, biopsy, Central lower - METASTATIC CARCINOMA WITH ASSOCIATED TUMOR NECROSIS. Microscopic Comment Immunostains will be performed and an addendum report will follow. (HCL:kh 05-15-13) Abigail Miyamoto MD Pathologist, Electronic Signature (Case signed 05/15/2013) Specimen Gross and Clinical Information Specimen(s) Obtained: Pelvis, biopsy, Central lower Specimen Clinical Information Recurrent cancer (tl) Gross Received in formalin are fragments and cores of soft white tissue having an aggregate measurement of 1.3 x 0.5 x 0.1 cm. The specimen is submitted in toto. (GRP: ecj 05/14/2013) 1 of 2 Duplicate copy FINAL for Zylstra, Marely P ([id number]) Stain(s) used in Diagnosis: The following stain(s) were used in diagnosing the case: PR - NOACIS, ER - NOACIS, CK-7, CDX-2, CK 20. The control(s) stained appropriately. Disc   RADIOGRAPHIC STUDIES:  Dg Chest 2 View  05/06/2013   *RADIOLOGY REPORT*  Clinical Data: Endometrial cancer  CHEST - 2 VIEW  Comparison: December 15, 2011.  Findings: Cardiomediastinal  silhouette appears normal.  No acute pulmonary disease is noted.  Bony thorax is intact.  Stable small dense nodule seen in right lower lobe compared to prior exam most consistent with granuloma.  IMPRESSION: Stable small nodule seen in right lower lobe most consistent with granuloma.  No acute cardiopulmonary abnormality seen.   Original  Report Authenticated By: Lupita Raider.,  M.D.   Ct Abdomen Pelvis W Contrast  05/05/2013   *RADIOLOGY REPORT*  Clinical Data: Pelvic pain.  History of endometrial carcinoma.  CT ABDOMEN AND PELVIS WITH CONTRAST  Technique:  Multidetector CT imaging of the abdomen and pelvis was performed following the standard protocol during bolus administration of intravenous contrast.  Contrast: OMNIPAQUE IOHEXOL 300 MG/ML  SOLN  Comparison: CT 01/05/2006  Findings: Lung bases are clear.  No pericardial fluid.  No focal hepatic lesion.  The gallbladder, pancreas, spleen, adrenal glands, kidneys are normal.  There is a cortical scarring of the right kidney.  No hydronephrosis.  The stomach, small bowel, appendix, cecum are normal.  The colon is normal to the level of the descending colon.  There is a lobular mass at the vaginal cuff measuring 7.0 x 3.2 cm in axial dimension and approximately 4 cm craniocaudad dimension. There is a mass compresses the distal aspect of the sigmoid colon just above the rectum (image 62, axial, image 57, coronal series). No mass approximates the posterior wall the bladder without clear invasion .There is no evidence of obstruction of stool proximal to this mass compressing the sigmoid colon.  There is a large necrotic lymph node measuring 2.8 cm within the sigmoid mesocolon centrally (60).  Patient status post hysterectomy and oophorectomy.  Small bilateral external iliac lymph nodes.  For example 8 mm node on the right and 5 mm node on the left (image 59).  There is no periaortic adenopathy.  No periportal or mesenteric adenopathy  Review of the bone  windows demonstrates post lumbar  fusion.  No aggressive osseous lesions.  IMPRESSION:  1.  Lobular mass at the vaginal cuff is consistent with endometrial carcinoma progression.  2.  Mass extends to the serosal surface of the sigmoid colon and compresses the lumen of the sigmoid colon.  No obstruction at this time.  3.  Mass  approximates the posterior wall of the bladder without clear invasion.  4. Large necrotic lymph node within the sigmoid mesocolon superior to the vaginal cuff.  5.  Small bilateral iliac lymph nodes.   Original Report Authenticated By: Genevive Bi, M.D.   Nm Pet Image Restag (ps) Skull Base To Thigh  05/09/2013   *RADIOLOGY REPORT*  Clinical Data: Subsequent treatment strategy for endometrial cancer. Hysterectomy 12/19/2011.  Recurrent pelvic masses on the CT.  NUCLEAR MEDICINE PET SKULL BASE TO THIGH  Fasting Blood Glucose:  141  Technique:  18.3 mCi F-18 FDG was injected intravenously. CT data was obtained and used for attenuation correction and anatomic localization only.  (This was not acquired as a diagnostic CT examination.) Additional exam technical data entered on technologist worksheet.  Comparison:  Abdominal pelvic CT 05/05/2013.  Findings:  Neck: No hypermetabolic lymph nodes in the neck.  Chest:  There are no hypermetabolic mediastinal or hilar lymph nodes.  There is no abnormal metabolic activity within the lungs. CT images demonstrate mild atherosclerosis.  There are no suspicious pulmonary findings.  Abdomen/Pelvis:  There is no abnormal metabolic activity within the liver, adrenal glands, spleen or pancreas.  No hypermetabolic lymph nodes are identified within the abdomen.  However, the large lobulated mass contiguous with the vaginal cuff is hypermetabolic. This demonstrates an SUV max of approximately 24.5.  The mass measures up to 6.8 x 3.6 cm transverse on image 186.  The necrotic nodal mass within the sigmoid the mesocolon is also hypermetabolic. This measures 3.1  cm on image  175 and demonstrates an SUV max of 17.1.  The right external iliac and pelvic sidewall lymph nodes demonstrated on the recent CT are also hypermetabolic.  The largest node measures 2.0 cm on image 176 and has an SUV max of 17.1.  No hypermetabolic left pelvic side wall nodes are identified.  There is no generalized peritoneal hypermetabolic activity or ascites. Scattered activity in the colon is likely physiologic.  The right kidney demonstrates lobularity and scarring.  Skeleton:  No focal hypermetabolic activity to suggest skeletal metastasis.  IMPRESSION:  1.  Multifocal recurrence within the pelvis involving the vaginal cuff, mesenteric and right pelvic sidewall lymph nodes. 2.  No distant metastases identified.   Original Report Authenticated By: Carey Bullocks, M.D.   Ct Biopsy  05/14/2013   *RADIOLOGY REPORT*  Clinical history:67 year old with history of endometrial cancer and suspicious lesions in the pelvis.  PROCEDURE(S): CT GUIDED BIOPSY OF PELVIC MASS  Physician: Rachelle Hora. Henn, MD  Medications:Versed 2 mg, Fentanyl 100 mcg.  A radiology nurse monitored the patient for moderate sedation.  Moderate sedation time:28 minutes  Procedure:The procedure was explained to the patient.  The risks and benefits of the procedure were discussed and the patient's questions were addressed.  Informed consent was obtained from the patient.  The patient was placed prone on the CT scanner.  Images of the pelvis were obtained.  The left gluteal area was prepped and draped in a sterile fashion.  Skin and soft tissues were anesthetized with lidocaine.  17 gauge needle was directed into the pelvic mass from a left transgluteal approach.  Needle position confirmed along the posterior aspect of the lesion.  Three core biopsies were obtained with an 18 gauge device.  Samples placed in formalin.  17 gauge needle was removed without complication.  Findings:There is a pelvic mass situated adjacent to the vaginal cuff.   The needle was positioned along the posterior aspect of the lesion.  Complications: None  Impression:CT guided core biopsies of the pelvic mass.   Original Report Authenticated By: Richarda Overlie, M.D.    ASSESSMENT: 67 year old female with  #1 recurrent endometrioid adenocarcinoma of the uterus. Patient is gong to receive palliative chemotherapy consisting of Taxol and carboplatinum. I discussed this with the patient and her daughters. We discussed the rationale for this. We discussed risks benefits and potential side effects.  #2 patient underwent Port-A-Cath placement by interventional radiology and underwent chemotherapy teaching class.   PLAN:  #1 Doing well.  Patient will proceed with chemotherapy today.  We discussed her antinausea medications in detail as well as possible side effects of the chemotherapy and the regimen in detail.  I gave the patient and her daughters detailed instructions on anti-nausea medication. I also referred her to nutrition at her request due to early satiety.    #2I prescribed a Fentanyl patch for her pain.  She will take Oxycodone until it has a chance to kick in.  Should it not improve in the next 2 days, we will increase the dose.   #3 She will follow up with [place] in one week.    All questions were answered. The patient knows to call the clinic with any problems, questions or concerns. We can certainly see the patient much sooner if necessary.  I spent 40 minutes counseling the patient face to face. The total time spent in the appointment was 60 minutes.  Cherie Ouch Lyn Hollingshead, NP Medical Oncology [facility] Phone: [phone] 06/03/2013, 3:15 PM

## 2013-06-03 NOTE — Patient Instructions (Addendum)
[facility] Discharge Instructions for Patients Receiving Chemotherapy  Today you received the following chemotherapy agents: Taxol.  To help prevent nausea and vomiting after your treatment, we encourage you to take your nausea medication as prescribed.   If you develop nausea and vomiting that is not controlled by your nausea medication, call the clinic.   BELOW ARE SYMPTOMS THAT SHOULD BE REPORTED IMMEDIATELY:  *FEVER GREATER THAN 100.5 F  *CHILLS WITH OR WITHOUT FEVER  NAUSEA AND VOMITING THAT IS NOT CONTROLLED WITH YOUR NAUSEA MEDICATION  *UNUSUAL SHORTNESS OF BREATH  *UNUSUAL BRUISING OR BLEEDING  TENDERNESS IN MOUTH AND THROAT WITH OR WITHOUT PRESENCE OF ULCERS  *URINARY PROBLEMS  *BOWEL PROBLEMS  UNUSUAL RASH Items with * indicate a potential emergency and should be followed up as soon as possible.  Feel free to call the clinic you have any questions or concerns. The clinic phone number is [phone].    

## 2013-06-04 ENCOUNTER — Ambulatory Visit (HOSPITAL_BASED_OUTPATIENT_CLINIC_OR_DEPARTMENT_OTHER): Payer: Medicare Other

## 2013-06-04 ENCOUNTER — Telehealth: Payer: Self-pay | Admitting: *Deleted

## 2013-06-04 VITALS — BP 125/67 | HR 76 | Temp 98.0°F

## 2013-06-04 DIAGNOSIS — C549 Malignant neoplasm of corpus uteri, unspecified: Secondary | ICD-10-CM

## 2013-06-04 DIAGNOSIS — C541 Malignant neoplasm of endometrium: Secondary | ICD-10-CM

## 2013-06-04 MED ORDER — PEGFILGRASTIM INJECTION 6 MG/0.6ML
6.0000 mg | Freq: Once | SUBCUTANEOUS | Status: AC
  Administered 2013-06-04: 6 mg via SUBCUTANEOUS
  Filled 2013-06-04: qty 0.6

## 2013-06-04 NOTE — Telephone Encounter (Signed)
Pam Vazquez here for Neulasta injection following 1st TC chemo treatment.  States that she is doing good.  No nausea, vomiting, or diarrhea.  Is drinking lots of fluids and eating without problems.  All questions answered.  Knows to call the office if she has any problems or concerns.

## 2013-06-04 NOTE — Telephone Encounter (Signed)
Left voice mail for patient to call office to let us know how she id doing s/p chemo.

## 2013-06-04 NOTE — Patient Instructions (Signed)

## 2013-06-05 ENCOUNTER — Telehealth: Payer: Self-pay | Admitting: *Deleted

## 2013-06-05 MED ORDER — FENTANYL CITRATE 0.05 MG/ML IJ SOLN
INTRAMUSCULAR | Status: AC
  Filled 2013-06-05: qty 6

## 2013-06-05 MED ORDER — MIDAZOLAM HCL 2 MG/2ML IJ SOLN
INTRAMUSCULAR | Status: AC
  Filled 2013-06-05: qty 6

## 2013-06-05 NOTE — Telephone Encounter (Signed)
sw pt gv appt for 06/10/13 for labs 2 12:45pm and ov @1 :15pm. i also gv appt for nut for 06/24/13@ 9am. Pt is aware...td

## 2013-06-10 ENCOUNTER — Encounter: Payer: Self-pay | Admitting: Adult Health

## 2013-06-10 ENCOUNTER — Telehealth: Payer: Self-pay | Admitting: Oncology

## 2013-06-10 ENCOUNTER — Other Ambulatory Visit (HOSPITAL_BASED_OUTPATIENT_CLINIC_OR_DEPARTMENT_OTHER): Payer: Medicare Other | Admitting: Lab

## 2013-06-10 ENCOUNTER — Ambulatory Visit (HOSPITAL_BASED_OUTPATIENT_CLINIC_OR_DEPARTMENT_OTHER): Payer: Medicare Other | Admitting: Adult Health

## 2013-06-10 VITALS — BP 109/75 | HR 92 | Temp 98.4°F | Resp 20 | Ht 60.0 in | Wt 158.9 lb

## 2013-06-10 DIAGNOSIS — C541 Malignant neoplasm of endometrium: Secondary | ICD-10-CM

## 2013-06-10 DIAGNOSIS — C549 Malignant neoplasm of corpus uteri, unspecified: Secondary | ICD-10-CM

## 2013-06-10 LAB — COMPREHENSIVE METABOLIC PANEL (CC13)
ALT: 15 U/L (ref 0–55)
AST: 16 U/L (ref 5–34)
CO2: 26 mEq/L (ref 22–29)
Chloride: 103 mEq/L (ref 98–109)
Sodium: 141 mEq/L (ref 136–145)
Total Bilirubin: 0.42 mg/dL (ref 0.20–1.20)
Total Protein: 6.7 g/dL (ref 6.4–8.3)

## 2013-06-10 LAB — CBC WITH DIFFERENTIAL/PLATELET
BASO%: 0.2 % (ref 0.0–2.0)
EOS%: 3.9 % (ref 0.0–7.0)
LYMPH%: 15.9 % (ref 14.0–49.7)
MCHC: 34.1 g/dL (ref 31.5–36.0)
MONO#: 0.8 10*3/uL (ref 0.1–0.9)
RBC: 3.96 10*6/uL (ref 3.70–5.45)
WBC: 8.4 10*3/uL (ref 3.9–10.3)
lymph#: 1.3 10*3/uL (ref 0.9–3.3)

## 2013-06-10 NOTE — Telephone Encounter (Signed)
gv pt appt schedule for August thru November.

## 2013-06-10 NOTE — Patient Instructions (Signed)
Doing well.  Take Senokot S two tablets twice a day and Miralax daily to two times per day.  Please call us if you have any questions or concerns.

## 2013-06-10 NOTE — Progress Notes (Signed)
OFFICE PROGRESS NOTE  CC  [name],[name], MD [address], [place] [address] Dr. Emmaline Kluver Dr. 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 She had undergone a robotic assisted total laparoscopic hysterectomy bilateral salpingo-oophorectomy right pelvic lymph node biopsy on 12/19/2011 without any complications. 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 There was no involvement of other organs lymphovascular invasion was not identified. 3 lymph nodes were negative for metastatic disease. 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 Patient subsequently continued to be seen by gynecologic oncology. In October 2013 patient had a Pap smear that was normal limits there was noted to be some granulation tissue that was removed from the cough. There is no evidence of disease.  #2patient was seen by Dr. Zenda Alpers on 04/29/2013 she had vaginal exam performed that was notable for a nodule in the vagina concerning for recurrence. A rectal examination revealed a firm 8 cm mass also. Because of this she had a biopsy performed. She also had a CT scan of the abdomen and pelvis ordered to further evaluate the sigmoid mass. The pathology came back inconclusive. However the CT of the abdomen and pelvis showed a lobular mass at the vagina cough measuring 7.0 x 3.2 cm proximally 4 cm craniocaudal dimension. The mass compressed the distal aspect of the sigmoid colon just above the rectum there was no mass approximates the posterior wall of the bladder without clear invasion noted evidence of  obstruction of stool proximal to this mass. There was also noted to be a large necrotic lymph node measuring 2.8 cm within the sigmoid mesocolon centrally. There are also small bilateral external iliac lymph nodes. There were no aggressive osseous lesions.   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  CURRENT THERAPY: Taxol/Carbo cycle 1 day 8  INTERVAL HISTORY: [place] 67 y.o. female returns for followup visit after chemotherapy.  She is doing well today.  She tolerated chemotherapy last week moderately well.  She denies nausea, vomiting.  She did have some bone pain.  I prescribed a fentanyl patch last week for her vaginal pain/pressure and she has taken a markedly decreased amount of Oxycodone.  She does have some insomnia and mild constipation.  She denies abdominal pain, distention.    Otherwise, a 10 point ROS is negative.    MEDICAL HISTORY: Past Medical History  Diagnosis Date  . Diabetes mellitus   . PONV (postoperative nausea and vomiting)   . Cancer     endometrial  . Angina     stress test on chart from 3/12/ none since March 2012  . Chronic kidney disease     kidney stones  . Hypertension     / EKG 1/13 Epic    ALLERGIES:  has No Known Allergies.  MEDICATIONS:  Current Outpatient Prescriptions  Medication Sig Dispense Refill  . amLODipine (NORVASC) 2.5 MG tablet Take 2.5 mg by mouth every morning.       . cholecalciferol (VITAMIN D) 1000 UNITS tablet Take 1,000 Units by mouth every morning.       . cyanocobalamin 2000 MCG  tablet Take 2,000 mcg by mouth 2 (two) times daily.      [name] dexamethasone (DECADRON) 4 MG tablet Take 2 tablets (8 mg total) by mouth 2 (two) times daily with a meal. Take two times a day starting the day after chemotherapy for 3 days.  30 tablet  1  . doxycycline (VIBRA-TABS) 100 MG tablet Take 100 mg by mouth every other day.      . fentaNYL  (DURAGESIC - DOSED MCG/HR) 25 MCG/HR Place 1 patch (25 mcg total) onto the skin every 3 (three) days.  10 patch  0  . lidocaine-prilocaine (EMLA) cream Apply topically as needed.  30 g  6  . lisinopril-hydrochlorothiazide (PRINZIDE,ZESTORETIC) 20-25 MG per tablet Take 1 tablet by mouth daily after breakfast.       . LORazepam (ATIVAN) 0.5 MG tablet Take 1 tablet (0.5 mg total) by mouth every 6 (six) hours as needed (Nausea or vomiting).  30 tablet  0  . Magnesium 250 MG TABS Take 1 tablet by mouth at bedtime.       . metFORMIN (GLUCOPHAGE) 500 MG tablet Take 500 mg by mouth every morning.       . metoprolol tartrate (LOPRESSOR) 25 MG tablet Take 25 mg by mouth 2 (two) times daily.       . Multiple Vitamin (MULTIVITAMIN WITH MINERALS) TABS Take 1 tablet by mouth every morning. She takes Surveyor, quantity.      . Omega-3 Fatty Acids (FISH OIL) 1200 MG CAPS Take 1,200 mg by mouth every morning.       . ondansetron (ZOFRAN) 8 MG tablet Take 1 tablet (8 mg total) by mouth 2 (two) times daily. Take two times a day starting the day after chemo for 3 days. Then take two times a day as needed for nausea or vomiting.  30 tablet  1  . Oxycodone HCl 10 MG TABS Take 1 tablet (10 mg total) by mouth every 4 (four) hours as needed (for moderate to severe pain).  80 tablet  0  . prochlorperazine (COMPAZINE) 10 MG tablet Take 1 tablet (10 mg total) by mouth every 6 (six) hours as needed (Nausea or vomiting).  30 tablet  1  . prochlorperazine (COMPAZINE) 25 MG suppository Place 1 suppository (25 mg total) rectally every 12 (twelve) hours as needed for nausea.  12 suppository  3   No current facility-administered medications for this visit.    SURGICAL HISTORY:  Past Surgical History  Procedure Laterality Date  . Hysteroscopy w/d&c  11/21/2011    Procedure: DILATATION AND CURETTAGE /HYSTEROSCOPY;  Surgeon: Miguel Aschoff;  Location: [facility];  Service: Gynecology;  Laterality: N/A;  . Tubal ligation    . Breast mass removal   1984, 1992    L breast  . Lithotripsy  2000, 2002  . Back surgery      91 and 2001  . Dilation and curettage of uterus    . Node dissection  12/19/2011    Procedure: NODE DISSECTION;  Surgeon: Laurette Schimke, MD PHD;  Location: [facility];  Service: Gynecology;  Laterality: N/A;    REVIEW OF SYSTEMS:  Pertinent items are noted in HPI.   PHYSICAL EXAMINATION: Blood pressure 109/75, pulse 92, temperature 98.4 F (36.9 C), temperature source Oral, resp. rate 20, height 5' (1.524 m), weight 158 lb 14.4 oz (72.077 kg). Body mass index is 31.03 kg/(m^2). General: Patient is a well appearing female in no acute distress HEENT: PERRLA, sclerae anicteric no conjunctival pallor, MMM Neck:  supple, no palpable adenopathy Lungs: clear to auscultation bilaterally, no wheezes, rhonchi, or rales Cardiovascular: regular rate rhythm, S1, S2, no murmurs, rubs or gallops Abdomen: Soft, non-tender, non-distended, normoactive bowel sounds, no HSM Extremities: warm and well perfused, no clubbing, cyanosis, or edema Skin: No rashes or lesions Neuro: Non-focal ECOG PERFORMANCE STATUS: 0 - Asymptomatic  LABORATORY DATA: Lab Results  Component Value Date   WBC 8.4 06/10/2013   HGB 12.3 06/10/2013   HCT 36.0 06/10/2013   MCV 91.0 06/10/2013   PLT 212 06/10/2013      Chemistry      Component Value Date/Time   NA 141 06/10/2013 1253   NA 141 12/20/2011 0334   K 4.4 06/10/2013 1253   K 4.1 12/20/2011 0334   CL 104 04/29/2013 1229   CL 104 12/20/2011 0334   CO2 26 06/10/2013 1253   CO2 27 12/20/2011 0334   BUN 24.8 06/10/2013 1253   BUN 10 12/20/2011 0334   CREATININE 1.1 06/10/2013 1253   CREATININE 0.80 12/20/2011 0334      Component Value Date/Time   CALCIUM 10.7* 06/10/2013 1253   CALCIUM 9.5 12/20/2011 0334   ALKPHOS 94 06/10/2013 1253   ALKPHOS 69 12/15/2011 1125   AST 16 06/10/2013 1253   AST 22 12/15/2011 1125   ALT 15 06/10/2013 1253   ALT 19 12/15/2011 1125   BILITOT 0.42 06/10/2013 1253   BILITOT 0.8 12/15/2011 1125     ADDITIONAL INFORMATION: The malignant cells are positive for estrogen receptor, progesterone receptor, cytokeratin 7 and focally positive for CDX-2. They are negative for cytokeratin 20. Given the clinical history as well as the histologic features, the findings are consistent with metastatic endometrioid adenocarcinoma. (JBK:kh 05-19-13) Pecola Leisure MD Pathologist, Electronic Signature ( Signed 05/19/2013) FINAL DIAGNOSIS Diagnosis Pelvis, biopsy, Central lower - METASTATIC CARCINOMA WITH ASSOCIATED TUMOR NECROSIS. Microscopic Comment Immunostains will be performed and an addendum report will follow. (HCL:kh 05-15-13) Abigail Miyamoto MD Pathologist, Electronic Signature (Case signed 05/15/2013) Specimen Gross and Clinical Information Specimen(s) Obtained: Pelvis, biopsy, Central lower Specimen Clinical Information Recurrent cancer (tl) Gross Received in formalin are fragments and cores of soft white tissue having an aggregate measurement of 1.3 x 0.5 x 0.1 cm. The specimen is submitted in toto. (GRP: ecj 05/14/2013) 1 of 2 Duplicate copy FINAL for Borawski, Quyen P ([id number]) Stain(s) used in Diagnosis: The following stain(s) were used in diagnosing the case: PR - NOACIS, ER - NOACIS, CK-7, CDX-2, CK 20. The control(s) stained appropriately. Disc   RADIOGRAPHIC STUDIES:  Dg Chest 2 View  05/06/2013   *RADIOLOGY REPORT*  Clinical Data: Endometrial cancer  CHEST - 2 VIEW  Comparison: December 15, 2011.  Findings: Cardiomediastinal silhouette appears normal.  No acute pulmonary disease is noted.  Bony thorax is intact.  Stable small dense nodule seen in right lower lobe compared to prior exam most consistent with granuloma.  IMPRESSION: Stable small nodule seen in right lower lobe most consistent with granuloma.  No acute cardiopulmonary abnormality seen.   Original Report Authenticated By: Lupita Raider.,  M.D.   Ct Abdomen Pelvis W Contrast  05/05/2013   *RADIOLOGY REPORT*   Clinical Data: Pelvic pain.  History of endometrial carcinoma.  CT ABDOMEN AND PELVIS WITH CONTRAST  Technique:  Multidetector CT imaging of the abdomen and pelvis was performed following the standard protocol during bolus administration of intravenous contrast.  Contrast: OMNIPAQUE IOHEXOL 300 MG/ML  SOLN  Comparison: CT 01/05/2006  Findings: Lung bases are clear.  No pericardial fluid.  No focal hepatic lesion.  The gallbladder, pancreas, spleen, adrenal glands, kidneys are normal.  There is a cortical scarring of the right kidney.  No hydronephrosis.  The stomach, small bowel, appendix, cecum are normal.  The colon is normal to the level of the descending colon.  There is a lobular mass at the vaginal cuff measuring 7.0 x 3.2 cm in axial dimension and approximately 4 cm craniocaudad dimension. There is a mass compresses the distal aspect of the sigmoid colon just above the rectum (image 62, axial, image 57, coronal series). No mass approximates the posterior wall the bladder without clear invasion .There is no evidence of obstruction of stool proximal to this mass compressing the sigmoid colon.  There is a large necrotic lymph node measuring 2.8 cm within the sigmoid mesocolon centrally (60).  Patient status post hysterectomy and oophorectomy.  Small bilateral external iliac lymph nodes.  For example 8 mm node on the right and 5 mm node on the left (image 59).  There is no periaortic adenopathy.  No periportal or mesenteric adenopathy  Review of the bone windows demonstrates post lumbar  fusion.  No aggressive osseous lesions.  IMPRESSION:  1.  Lobular mass at the vaginal cuff is consistent with endometrial carcinoma progression.  2.  Mass extends to the serosal surface of the sigmoid colon and compresses the lumen of the sigmoid colon.  No obstruction at this time.  3.  Mass  approximates the posterior wall of the bladder without clear invasion.  4. Large necrotic lymph node within the sigmoid mesocolon  superior to the vaginal cuff.  5.  Small bilateral iliac lymph nodes.   Original Report Authenticated By: Genevive Bi, M.D.   Nm Pet Image Restag (ps) Skull Base To Thigh  05/09/2013   *RADIOLOGY REPORT*  Clinical Data: Subsequent treatment strategy for endometrial cancer. Hysterectomy 12/19/2011.  Recurrent pelvic masses on the CT.  NUCLEAR MEDICINE PET SKULL BASE TO THIGH  Fasting Blood Glucose:  141  Technique:  18.3 mCi F-18 FDG was injected intravenously. CT data was obtained and used for attenuation correction and anatomic localization only.  (This was not acquired as a diagnostic CT examination.) Additional exam technical data entered on technologist worksheet.  Comparison:  Abdominal pelvic CT 05/05/2013.  Findings:  Neck: No hypermetabolic lymph nodes in the neck.  Chest:  There are no hypermetabolic mediastinal or hilar lymph nodes.  There is no abnormal metabolic activity within the lungs. CT images demonstrate mild atherosclerosis.  There are no suspicious pulmonary findings.  Abdomen/Pelvis:  There is no abnormal metabolic activity within the liver, adrenal glands, spleen or pancreas.  No hypermetabolic lymph nodes are identified within the abdomen.  However, the large lobulated mass contiguous with the vaginal cuff is hypermetabolic. This demonstrates an SUV max of approximately 24.5.  The mass measures up to 6.8 x 3.6 cm transverse on image 186.  The necrotic nodal mass within the sigmoid the mesocolon is also hypermetabolic. This measures 3.1 cm on image 175 and demonstrates an SUV max of 17.1.  The right external iliac and pelvic sidewall lymph nodes demonstrated on the recent CT are also hypermetabolic.  The largest node measures 2.0 cm on image 176 and has an SUV max of 17.1.  No hypermetabolic left pelvic side wall nodes are identified.  There is no generalized peritoneal hypermetabolic activity or ascites. Scattered activity in the colon is likely physiologic.  The right kidney  demonstrates lobularity and scarring.  Skeleton:  No focal hypermetabolic activity to suggest skeletal metastasis.  IMPRESSION:  1.  Multifocal recurrence within the pelvis involving the vaginal cuff, mesenteric and right pelvic sidewall lymph nodes. 2.  No distant metastases identified.   Original Report Authenticated By: Carey Bullocks, M.D.   Ct Biopsy  05/14/2013   *RADIOLOGY REPORT*  Clinical history:67 year old with history of endometrial cancer and suspicious lesions in the pelvis.  PROCEDURE(S): CT GUIDED BIOPSY OF PELVIC MASS  Physician: Rachelle Hora. Henn, MD  Medications:Versed 2 mg, Fentanyl 100 mcg.  A radiology nurse monitored the patient for moderate sedation.  Moderate sedation time:28 minutes  Procedure:The procedure was explained to the patient.  The risks and benefits of the procedure were discussed and the patient's questions were addressed.  Informed consent was obtained from the patient.  The patient was placed prone on the CT scanner.  Images of the pelvis were obtained.  The left gluteal area was prepped and draped in a sterile fashion.  Skin and soft tissues were anesthetized with lidocaine.  17 gauge needle was directed into the pelvic mass from a left transgluteal approach.  Needle position confirmed along the posterior aspect of the lesion.  Three core biopsies were obtained with an 18 gauge device.  Samples placed in formalin.  17 gauge needle was removed without complication.  Findings:There is a pelvic mass situated adjacent to the vaginal cuff.  The needle was positioned along the posterior aspect of the lesion.  Complications: None  Impression:CT guided core biopsies of the pelvic mass.   Original Report Authenticated By: Richarda Overlie, M.D.    ASSESSMENT: 67 year old female with  #1 recurrent endometrioid adenocarcinoma of the uterus. Patient is gong to receive palliative chemotherapy consisting of Taxol and carboplatinum. I discussed this with the patient and her daughters. We  discussed the rationale for this. We discussed risks benefits and potential side effects.  #2 patient underwent Port-A-Cath placement by interventional radiology and underwent chemotherapy teaching class.   PLAN:  #1 Doing well.  Patient tolerated chemotherapy relatively well.  Labs are stable and I reviewed them with the patient in detail.      #2I Patient will continue fentanyl patch and PRN oxycodone for pain.  Her need of oxycodone has decreased dramatically.     #3 She will follow up with [place] in two weeks for chemotherapy.    #4 She will take Senokot S two tabs bid and Miralax daily for constipation.    All questions were answered. The patient knows to call the clinic with any problems, questions or concerns. We can certainly see the patient much sooner if necessary.  I spent 25 minutes counseling the patient face to face. The total time spent in the appointment was 30 minutes.  Cherie Ouch Lyn Hollingshead, NP Medical Oncology [facility] Phone: [phone] 06/10/2013, 11:21 PM

## 2013-06-18 ENCOUNTER — Other Ambulatory Visit: Payer: Self-pay

## 2013-06-24 ENCOUNTER — Ambulatory Visit (HOSPITAL_BASED_OUTPATIENT_CLINIC_OR_DEPARTMENT_OTHER): Payer: Medicare Other | Admitting: Oncology

## 2013-06-24 ENCOUNTER — Ambulatory Visit: Payer: Medicare Other | Admitting: Nutrition

## 2013-06-24 ENCOUNTER — Other Ambulatory Visit (HOSPITAL_BASED_OUTPATIENT_CLINIC_OR_DEPARTMENT_OTHER): Payer: Medicare Other | Admitting: Lab

## 2013-06-24 ENCOUNTER — Other Ambulatory Visit: Payer: Medicare Other | Admitting: Lab

## 2013-06-24 ENCOUNTER — Ambulatory Visit (HOSPITAL_BASED_OUTPATIENT_CLINIC_OR_DEPARTMENT_OTHER): Payer: Medicare Other

## 2013-06-24 ENCOUNTER — Encounter: Payer: Self-pay | Admitting: Oncology

## 2013-06-24 VITALS — BP 132/74 | HR 101 | Temp 98.1°F | Resp 18 | Ht 60.0 in | Wt 150.4 lb

## 2013-06-24 DIAGNOSIS — R109 Unspecified abdominal pain: Secondary | ICD-10-CM

## 2013-06-24 DIAGNOSIS — C549 Malignant neoplasm of corpus uteri, unspecified: Secondary | ICD-10-CM

## 2013-06-24 DIAGNOSIS — C541 Malignant neoplasm of endometrium: Secondary | ICD-10-CM

## 2013-06-24 DIAGNOSIS — R52 Pain, unspecified: Secondary | ICD-10-CM

## 2013-06-24 DIAGNOSIS — Z5111 Encounter for antineoplastic chemotherapy: Secondary | ICD-10-CM

## 2013-06-24 DIAGNOSIS — R21 Rash and other nonspecific skin eruption: Secondary | ICD-10-CM

## 2013-06-24 LAB — CBC WITH DIFFERENTIAL/PLATELET
BASO%: 0.1 % (ref 0.0–2.0)
EOS%: 0 % (ref 0.0–7.0)
LYMPH%: 7.6 % — ABNORMAL LOW (ref 14.0–49.7)
MCH: 31.5 pg (ref 25.1–34.0)
MCHC: 34.5 g/dL (ref 31.5–36.0)
MONO#: 0 10*3/uL — ABNORMAL LOW (ref 0.1–0.9)
MONO%: 0.6 % (ref 0.0–14.0)
Platelets: 203 10*3/uL (ref 145–400)
RBC: 3.73 10*6/uL (ref 3.70–5.45)
WBC: 6.8 10*3/uL (ref 3.9–10.3)

## 2013-06-24 LAB — COMPREHENSIVE METABOLIC PANEL (CC13)
ALT: 15 U/L (ref 0–55)
AST: 15 U/L (ref 5–34)
Alkaline Phosphatase: 76 U/L (ref 40–150)
Creatinine: 1.1 mg/dL (ref 0.6–1.1)
Sodium: 140 mEq/L (ref 136–145)
Total Bilirubin: 0.53 mg/dL (ref 0.20–1.20)
Total Protein: 7.8 g/dL (ref 6.4–8.3)

## 2013-06-24 MED ORDER — DIPHENHYDRAMINE HCL 50 MG/ML IJ SOLN
50.0000 mg | Freq: Once | INTRAMUSCULAR | Status: AC
  Administered 2013-06-24: 50 mg via INTRAVENOUS

## 2013-06-24 MED ORDER — OXYCODONE HCL 10 MG PO TABS: 10.0000 mg | ORAL_TABLET | ORAL | Status: DC | PRN

## 2013-06-24 MED ORDER — FENTANYL 25 MCG/HR TD PT72: 1.0000 | MEDICATED_PATCH | TRANSDERMAL | Status: DC

## 2013-06-24 MED ORDER — HEPARIN SOD (PORK) LOCK FLUSH 100 UNIT/ML IV SOLN
500.0000 [IU] | Freq: Once | INTRAVENOUS | Status: AC | PRN
  Administered 2013-06-24: 500 [IU]
  Filled 2013-06-24: qty 5

## 2013-06-24 MED ORDER — PACLITAXEL CHEMO INJECTION 300 MG/50ML
175.0000 mg/m2 | Freq: Once | INTRAVENOUS | Status: AC
  Administered 2013-06-24: 306 mg via INTRAVENOUS
  Filled 2013-06-24: qty 51

## 2013-06-24 MED ORDER — ONDANSETRON 16 MG/50ML IVPB (CHCC)
16.0000 mg | Freq: Once | INTRAVENOUS | Status: AC
  Administered 2013-06-24: 16 mg via INTRAVENOUS

## 2013-06-24 MED ORDER — SODIUM CHLORIDE 0.9 % IJ SOLN
10.0000 mL | INTRAMUSCULAR | Status: DC | PRN
  Administered 2013-06-24: 10 mL
  Filled 2013-06-24: qty 10

## 2013-06-24 MED ORDER — SODIUM CHLORIDE 0.9 % IV SOLN
Freq: Once | INTRAVENOUS | Status: AC
  Administered 2013-06-24: 11:00:00 via INTRAVENOUS

## 2013-06-24 MED ORDER — FAMOTIDINE IN NACL 20-0.9 MG/50ML-% IV SOLN
20.0000 mg | Freq: Once | INTRAVENOUS | Status: AC
  Administered 2013-06-24: 20 mg via INTRAVENOUS

## 2013-06-24 MED ORDER — SODIUM CHLORIDE 0.9 % IV SOLN
520.0000 mg | Freq: Once | INTRAVENOUS | Status: AC
  Administered 2013-06-24: 520 mg via INTRAVENOUS
  Filled 2013-06-24: qty 52

## 2013-06-24 MED ORDER — DEXAMETHASONE SODIUM PHOSPHATE 20 MG/5ML IJ SOLN
20.0000 mg | Freq: Once | INTRAMUSCULAR | Status: AC
  Administered 2013-06-24: 20 mg via INTRAVENOUS

## 2013-06-24 NOTE — Patient Instructions (Addendum)
#  1Proceed with cycle 2 of Taxol carboplatinum.  #2 return in one week's time for followup visit.  #3 fentanyl and oxycodone Prescriptions were given

## 2013-06-24 NOTE — Progress Notes (Signed)
Patient is a 67 year old female diagnosed with metastatic endometrial cancer receiving palliative chemotherapy.  Past medical history includes diabetes, postop nausea and vomiting, angina, chronic kidney disease, and hypertension.  Medications include:Vitamin D, vitamin B12, Decadron, Ativan, magnesium, Glucophage, multivitamin, omega-3 fatty acids, Zofran, and Compazine.  Labs include glucose 283 on August 12.  Height: 60 inches. Weight: 150.4 pounds August 12. Usual body weight: 180 pounds March 2014. BMI: 29.37.  Patient reports that she does have constipation, and poor appetite.  She has been eating much less than she used to.  She states she has never checked her blood sugar levels.  She is compliant with taking her glucophage.  She states glucose was elevated today because she drank orange juice before labs.  Patient states she plans on following up with her primary physician regarding blood sugar levels and medications.  Nutrition diagnosis: Unintended weight loss related to diagnosis of metastatic endometrial cancer and associated treatments as evidenced by 16% weight loss from usual body weight over the past 5 months.  Intervention: Patient was educated to increase small amounts of food more often throughout the day.  I've educated her on sources of increased protein in her diet.  We've discussed strategies for her adding healthy calories to her meals.  She understands her goal is weight maintenance while undergoing treatment.  She was educated on the importance of increased hydration.  Patient was encouraged to consume oral nutrition supplements such as Glucerna or Ensure based on tolerance.  Patient was provided with fact sheets on increasing calories and protein, and poor appetite, as well as constipation.  She was also provided with coupons for her oral nutrition supplements.  Questions were answered and teach back method used.  Monitoring, evaluation, goals: Patient will increase  oral intake to promote weight maintenance throughout treatment.  Next visit: Tuesday, September 2, during chemotherapy.

## 2013-06-24 NOTE — Progress Notes (Signed)
OFFICE PROGRESS NOTE  CC  [name],[name], MD [address], [place] [address] Dr. Emmaline Kluver Dr. 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 She had undergone a robotic assisted total laparoscopic hysterectomy bilateral salpingo-oophorectomy right pelvic lymph node biopsy on 12/19/2011 without any complications. 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 There was no involvement of other organs lymphovascular invasion was not identified. 3 lymph nodes were negative for metastatic disease. 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 Patient subsequently continued to be seen by gynecologic oncology. In October 2013 patient had a Pap smear that was normal limits there was noted to be some granulation tissue that was removed from the cough. There is no evidence of disease.  #2patient was seen by Dr. Zenda Alpers on 04/29/2013 she had vaginal exam performed that was notable for a nodule in the vagina concerning for recurrence. A rectal examination revealed a firm 8 cm mass also. Because of this she had a biopsy performed. She also had a CT scan of the abdomen and pelvis ordered to further evaluate the sigmoid mass. The pathology came back inconclusive. However the CT of the abdomen and pelvis showed a lobular mass at the vagina cough measuring 7.0 x 3.2 cm proximally 4 cm craniocaudal dimension. The mass compressed the distal aspect of the sigmoid colon just above the rectum there was no mass approximates the posterior wall of the bladder without clear invasion noted evidence of  obstruction of stool proximal to this mass. There was also noted to be a large necrotic lymph node measuring 2.8 cm within the sigmoid mesocolon centrally. There are also small bilateral external iliac lymph nodes. There were no aggressive osseous lesions.   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  #4 patient was begun on palliative chemotherapy starting on 06/03/2013 with Taxol carboplatinum. Total of 6 cycles of treatment as planned.   #5 pain control: She is being managed with fentanyl patches and oxycodone.  CURRENT THERAPY: Taxol/Carbo cycle 2 day 1  INTERVAL HISTORY: Pam Vazquez 67 y.o. female returns for followup visit prior to her chemotherapy. Overall she's doing well. She tolerated first cycle of chemotherapy quite nicely without any significant problems except for rash. She denies having any peripheral paresthesias nausea vomiting fevers chills night sweats headaches she has no shortness of breath chest pains palpitations no myalgias and arthralgias. She does have some pain however this is controlled with the fentanyl patches and oxycodone. A prescription was given to her again today. Remainder of the 10 point review of systems is negative.  MEDICAL HISTORY: Past Medical History  Diagnosis Date  . Diabetes mellitus   . PONV (postoperative nausea and vomiting)   . Cancer     endometrial  . Angina     stress test on chart from 3/12/ none since March 2012  . Chronic kidney disease     kidney stones  . Hypertension     / EKG 1/13 Epic    ALLERGIES:  has No Known Allergies.  MEDICATIONS:  Current Outpatient Prescriptions  Medication Sig Dispense Refill  . amLODipine (  NORVASC) 2.5 MG tablet Take 2.5 mg by mouth every morning.       . cyanocobalamin 2000 MCG tablet Take 2,000 mcg by mouth 2 (two) times daily.      [name] dexamethasone (DECADRON) 4 MG tablet Take 2 tablets (8 mg  total) by mouth 2 (two) times daily with a meal. Take two times a day starting the day after chemotherapy for 3 days.  30 tablet  1  . doxycycline (VIBRA-TABS) 100 MG tablet Take 100 mg by mouth every other day.      . fentaNYL (DURAGESIC - DOSED MCG/HR) 25 MCG/HR Place 1 patch (25 mcg total) onto the skin every 3 (three) days.  10 patch  0  . lidocaine-prilocaine (EMLA) cream Apply topically as needed.  30 g  6  . lisinopril-hydrochlorothiazide (PRINZIDE,ZESTORETIC) 20-25 MG per tablet Take 1 tablet by mouth daily after breakfast.       . LORazepam (ATIVAN) 0.5 MG tablet Take 1 tablet (0.5 mg total) by mouth every 6 (six) hours as needed (Nausea or vomiting).  30 tablet  0  . metFORMIN (GLUCOPHAGE) 500 MG tablet Take 500 mg by mouth every morning.       . metoprolol tartrate (LOPRESSOR) 25 MG tablet Take 25 mg by mouth 2 (two) times daily.       . Multiple Vitamin (MULTIVITAMIN WITH MINERALS) TABS Take 1 tablet by mouth every morning. She takes Surveyor, quantity.      . ondansetron (ZOFRAN) 8 MG tablet Take 1 tablet (8 mg total) by mouth 2 (two) times daily. Take two times a day starting the day after chemo for 3 days. Then take two times a day as needed for nausea or vomiting.  30 tablet  1  . Oxycodone HCl 10 MG TABS Take 1 tablet (10 mg total) by mouth every 4 (four) hours as needed (for moderate to severe pain).  80 tablet  0  . prochlorperazine (COMPAZINE) 10 MG tablet Take 1 tablet (10 mg total) by mouth every 6 (six) hours as needed (Nausea or vomiting).  30 tablet  1  . prochlorperazine (COMPAZINE) 25 MG suppository Place 1 suppository (25 mg total) rectally every 12 (twelve) hours as needed for nausea.  12 suppository  3   No current facility-administered medications for this visit.    SURGICAL HISTORY:  Past Surgical History  Procedure Laterality Date  . Hysteroscopy w/d&c  11/21/2011    Procedure: DILATATION AND CURETTAGE /HYSTEROSCOPY;  Surgeon: Miguel Aschoff;  Location: [facility];  Service:  Gynecology;  Laterality: N/A;  . Tubal ligation    . Breast mass removal  1984, 1992    L breast  . Lithotripsy  2000, 2002  . Back surgery      91 and 2001  . Dilation and curettage of uterus    . Node dissection  12/19/2011    Procedure: NODE DISSECTION;  Surgeon: Laurette Schimke, MD PHD;  Location: [facility];  Service: Gynecology;  Laterality: N/A;    REVIEW OF SYSTEMS:  Pertinent items are noted in HPI.   PHYSICAL EXAMINATION: Blood pressure 132/74, pulse 101, temperature 98.1 F (36.7 C), temperature source Oral, resp. rate 18, height 5' (1.524 m), weight 150 lb 6.4 oz (68.221 kg). Body mass index is 29.37 kg/(m^2). General: Patient is a well appearing female in no acute distress HEENT: PERRLA, sclerae anicteric no conjunctival pallor, MMM Neck: supple, no palpable adenopathy Lungs: clear to auscultation bilaterally, no wheezes, rhonchi, or rales Cardiovascular: regular rate rhythm, S1,  S2, no murmurs, rubs or gallops Abdomen: Soft, non-tender, non-distended, normoactive bowel sounds, no HSM Extremities: warm and well perfused, no clubbing, cyanosis, or edema Skin: No rashes or lesions Neuro: Non-focal ECOG PERFORMANCE STATUS: 0 - Asymptomatic  LABORATORY DATA: Lab Results  Component Value Date   WBC 6.8 06/24/2013   HGB 11.8 06/24/2013   HCT 34.1* 06/24/2013   MCV 91.3 06/24/2013   PLT 203 06/24/2013      Chemistry      Component Value Date/Time   NA 141 06/10/2013 1253   NA 141 12/20/2011 0334   K 4.4 06/10/2013 1253   K 4.1 12/20/2011 0334   CL 104 04/29/2013 1229   CL 104 12/20/2011 0334   CO2 26 06/10/2013 1253   CO2 27 12/20/2011 0334   BUN 24.8 06/10/2013 1253   BUN 10 12/20/2011 0334   CREATININE 1.1 06/10/2013 1253   CREATININE 0.80 12/20/2011 0334      Component Value Date/Time   CALCIUM 10.7* 06/10/2013 1253   CALCIUM 9.5 12/20/2011 0334   ALKPHOS 94 06/10/2013 1253   ALKPHOS 69 12/15/2011 1125   AST 16 06/10/2013 1253   AST 22 12/15/2011 1125   ALT 15 06/10/2013 1253   ALT 19  12/15/2011 1125   BILITOT 0.42 06/10/2013 1253   BILITOT 0.8 12/15/2011 1125    ADDITIONAL INFORMATION: The malignant cells are positive for estrogen receptor, progesterone receptor, cytokeratin 7 and focally positive for CDX-2. They are negative for cytokeratin 20. Given the clinical history as well as the histologic features, the findings are consistent with metastatic endometrioid adenocarcinoma. (JBK:kh 05-19-13) Pecola Leisure MD Pathologist, Electronic Signature ( Signed 05/19/2013) FINAL DIAGNOSIS Diagnosis Pelvis, biopsy, Central lower - METASTATIC CARCINOMA WITH ASSOCIATED TUMOR NECROSIS. Microscopic Comment Immunostains will be performed and an addendum report will follow. (HCL:kh 05-15-13) Abigail Miyamoto MD Pathologist, Electronic Signature (Case signed 05/15/2013) Specimen Gross and Clinical Information Specimen(s) Obtained: Pelvis, biopsy, Central lower Specimen Clinical Information Recurrent cancer (tl) Gross Received in formalin are fragments and cores of soft white tissue having an aggregate measurement of 1.3 x 0.5 x 0.1 cm. The specimen is submitted in toto. (GRP: ecj 05/14/2013) 1 of 2 Duplicate copy FINAL for Stineman, Sadhana P ([id number]) Stain(s) used in Diagnosis: The following stain(s) were used in diagnosing the case: PR - NOACIS, ER - NOACIS, CK-7, CDX-2, CK 20. The control(s) stained appropriately. Disc   RADIOGRAPHIC STUDIES:  Dg Chest 2 View  05/06/2013   *RADIOLOGY REPORT*  Clinical Data: Endometrial cancer  CHEST - 2 VIEW  Comparison: December 15, 2011.  Findings: Cardiomediastinal silhouette appears normal.  No acute pulmonary disease is noted.  Bony thorax is intact.  Stable small dense nodule seen in right lower lobe compared to prior exam most consistent with granuloma.  IMPRESSION: Stable small nodule seen in right lower lobe most consistent with granuloma.  No acute cardiopulmonary abnormality seen.   Original Report Authenticated By: Lupita Raider.,   M.D.   Ct Abdomen Pelvis W Contrast  05/05/2013   *RADIOLOGY REPORT*  Clinical Data: Pelvic pain.  History of endometrial carcinoma.  CT ABDOMEN AND PELVIS WITH CONTRAST  Technique:  Multidetector CT imaging of the abdomen and pelvis was performed following the standard protocol during bolus administration of intravenous contrast.  Contrast: OMNIPAQUE IOHEXOL 300 MG/ML  SOLN  Comparison: CT 01/05/2006  Findings: Lung bases are clear.  No pericardial fluid.  No focal hepatic lesion.  The gallbladder, pancreas, spleen, adrenal glands, kidneys are normal.  There is a cortical scarring of the right kidney.  No hydronephrosis.  The stomach, small bowel, appendix, cecum are normal.  The colon is normal to the level of the descending colon.  There is a lobular mass at the vaginal cuff measuring 7.0 x 3.2 cm in axial dimension and approximately 4 cm craniocaudad dimension. There is a mass compresses the distal aspect of the sigmoid colon just above the rectum (image 62, axial, image 57, coronal series). No mass approximates the posterior wall the bladder without clear invasion .There is no evidence of obstruction of stool proximal to this mass compressing the sigmoid colon.  There is a large necrotic lymph node measuring 2.8 cm within the sigmoid mesocolon centrally (60).  Patient status post hysterectomy and oophorectomy.  Small bilateral external iliac lymph nodes.  For example 8 mm node on the right and 5 mm node on the left (image 59).  There is no periaortic adenopathy.  No periportal or mesenteric adenopathy  Review of the bone windows demonstrates post lumbar  fusion.  No aggressive osseous lesions.  IMPRESSION:  1.  Lobular mass at the vaginal cuff is consistent with endometrial carcinoma progression.  2.  Mass extends to the serosal surface of the sigmoid colon and compresses the lumen of the sigmoid colon.  No obstruction at this time.  3.  Mass  approximates the posterior wall of the bladder without  clear invasion.  4. Large necrotic lymph node within the sigmoid mesocolon superior to the vaginal cuff.  5.  Small bilateral iliac lymph nodes.   Original Report Authenticated By: Genevive Bi, M.D.   Nm Pet Image Restag (ps) Skull Base To Thigh  05/09/2013   *RADIOLOGY REPORT*  Clinical Data: Subsequent treatment strategy for endometrial cancer. Hysterectomy 12/19/2011.  Recurrent pelvic masses on the CT.  NUCLEAR MEDICINE PET SKULL BASE TO THIGH  Fasting Blood Glucose:  141  Technique:  18.3 mCi F-18 FDG was injected intravenously. CT data was obtained and used for attenuation correction and anatomic localization only.  (This was not acquired as a diagnostic CT examination.) Additional exam technical data entered on technologist worksheet.  Comparison:  Abdominal pelvic CT 05/05/2013.  Findings:  Neck: No hypermetabolic lymph nodes in the neck.  Chest:  There are no hypermetabolic mediastinal or hilar lymph nodes.  There is no abnormal metabolic activity within the lungs. CT images demonstrate mild atherosclerosis.  There are no suspicious pulmonary findings.  Abdomen/Pelvis:  There is no abnormal metabolic activity within the liver, adrenal glands, spleen or pancreas.  No hypermetabolic lymph nodes are identified within the abdomen.  However, the large lobulated mass contiguous with the vaginal cuff is hypermetabolic. This demonstrates an SUV max of approximately 24.5.  The mass measures up to 6.8 x 3.6 cm transverse on image 186.  The necrotic nodal mass within the sigmoid the mesocolon is also hypermetabolic. This measures 3.1 cm on image 175 and demonstrates an SUV max of 17.1.  The right external iliac and pelvic sidewall lymph nodes demonstrated on the recent CT are also hypermetabolic.  The largest node measures 2.0 cm on image 176 and has an SUV max of 17.1.  No hypermetabolic left pelvic side wall nodes are identified.  There is no generalized peritoneal hypermetabolic activity or ascites.  Scattered activity in the colon is likely physiologic.  The right kidney demonstrates lobularity and scarring.  Skeleton:  No focal hypermetabolic activity to suggest skeletal metastasis.  IMPRESSION:  1.  Multifocal recurrence within the pelvis  involving the vaginal cuff, mesenteric and right pelvic sidewall lymph nodes. 2.  No distant metastases identified.   Original Report Authenticated By: Carey Bullocks, M.D.   Ct Biopsy  05/14/2013   *RADIOLOGY REPORT*  Clinical history:67 year old with history of endometrial cancer and suspicious lesions in the pelvis.  PROCEDURE(S): CT GUIDED BIOPSY OF PELVIC MASS  Physician: Rachelle Hora. Henn, MD  Medications:Versed 2 mg, Fentanyl 100 mcg.  A radiology nurse monitored the patient for moderate sedation.  Moderate sedation time:28 minutes  Procedure:The procedure was explained to the patient.  The risks and benefits of the procedure were discussed and the patient's questions were addressed.  Informed consent was obtained from the patient.  The patient was placed prone on the CT scanner.  Images of the pelvis were obtained.  The left gluteal area was prepped and draped in a sterile fashion.  Skin and soft tissues were anesthetized with lidocaine.  17 gauge needle was directed into the pelvic mass from a left transgluteal approach.  Needle position confirmed along the posterior aspect of the lesion.  Three core biopsies were obtained with an 18 gauge device.  Samples placed in formalin.  17 gauge needle was removed without complication.  Findings:There is a pelvic mass situated adjacent to the vaginal cuff.  The needle was positioned along the posterior aspect of the lesion.  Complications: None  Impression:CT guided core biopsies of the pelvic mass.   Original Report Authenticated By: Richarda Overlie, M.D.    ASSESSMENT: 67 year old female with  #1 recurrent endometrioid adenocarcinoma of the uterus. Patient is gong to receive palliative chemotherapy consisting of Taxol and  carboplatinum. I discussed this with the patient and her daughters. We discussed the rationale for this. We discussed risks benefits and potential side effects.  #2 patient underwent Port-A-Cath placement by interventional radiology and underwent chemotherapy teaching class.   #3 patient was begun on Taxol and carboplatinum starting on 06/03/2013. She is now here for cycle #2 day 1 of her chemotherapy. She will proceeded to [facility] tomorrow.  #4 pain control patient is on fentanyl patches as well as oxycodone. I have refilled her prescriptions today. Her pain is really well controlled.  #5 rash unclear etiology I have recommended she continue the Zyrtec/Claritin. She will also continue to take Benadryl as needed postchemotherapy.  #6 patient is recommended to exercise and eat a healthy diet.   PLAN:  #1 patient will proceed with cycle 2 day 1 of Taxol carboplatinum today. Her blood counts are reviewed today. Overall she's doing well.  #2 she will return tomorrow for Neulasta injection.  #3 she knows to call with any problems questions or concerns.  #4 she will be seen back in one week's time for lab and followup.  All questions were answered. The patient knows to call the clinic with any problems, questions or concerns. We can certainly see the patient much sooner if necessary.  I spent 25 minutes counseling the patient face to face. The total time spent in the appointment was 30 minutes.  Drue Second, MD Medical/Oncology [facility] [phone] (beeper) [phone] (Office)  06/24/2013, 9:59 AM

## 2013-06-25 ENCOUNTER — Ambulatory Visit (HOSPITAL_BASED_OUTPATIENT_CLINIC_OR_DEPARTMENT_OTHER): Payer: Medicare Other

## 2013-06-25 VITALS — BP 104/55 | HR 84 | Temp 97.9°F

## 2013-06-25 DIAGNOSIS — C549 Malignant neoplasm of corpus uteri, unspecified: Secondary | ICD-10-CM

## 2013-06-25 DIAGNOSIS — C541 Malignant neoplasm of endometrium: Secondary | ICD-10-CM

## 2013-06-25 MED ORDER — PEGFILGRASTIM INJECTION 6 MG/0.6ML
6.0000 mg | Freq: Once | SUBCUTANEOUS | Status: AC
  Administered 2013-06-25: 6 mg via SUBCUTANEOUS
  Filled 2013-06-25: qty 0.6

## 2013-07-01 ENCOUNTER — Encounter: Payer: Self-pay | Admitting: Adult Health

## 2013-07-01 ENCOUNTER — Other Ambulatory Visit (HOSPITAL_BASED_OUTPATIENT_CLINIC_OR_DEPARTMENT_OTHER): Payer: Medicare Other | Admitting: Lab

## 2013-07-01 ENCOUNTER — Ambulatory Visit (HOSPITAL_BASED_OUTPATIENT_CLINIC_OR_DEPARTMENT_OTHER): Payer: Medicare Other | Admitting: Adult Health

## 2013-07-01 VITALS — BP 90/61 | HR 89 | Temp 98.3°F | Resp 20 | Ht 60.0 in | Wt 147.3 lb

## 2013-07-01 DIAGNOSIS — R5381 Other malaise: Secondary | ICD-10-CM

## 2013-07-01 DIAGNOSIS — C801 Malignant (primary) neoplasm, unspecified: Secondary | ICD-10-CM

## 2013-07-01 DIAGNOSIS — C541 Malignant neoplasm of endometrium: Secondary | ICD-10-CM

## 2013-07-01 DIAGNOSIS — C50919 Malignant neoplasm of unspecified site of unspecified female breast: Secondary | ICD-10-CM

## 2013-07-01 DIAGNOSIS — N189 Chronic kidney disease, unspecified: Secondary | ICD-10-CM

## 2013-07-01 DIAGNOSIS — E119 Type 2 diabetes mellitus without complications: Secondary | ICD-10-CM

## 2013-07-01 LAB — COMPREHENSIVE METABOLIC PANEL (CC13)
ALT: 20 U/L (ref 0–55)
AST: 15 U/L (ref 5–34)
Albumin: 3.4 g/dL — ABNORMAL LOW (ref 3.5–5.0)
BUN: 36.1 mg/dL — ABNORMAL HIGH (ref 7.0–26.0)
Calcium: 10.8 mg/dL — ABNORMAL HIGH (ref 8.4–10.4)
Chloride: 102 mEq/L (ref 98–109)
Potassium: 4.5 mEq/L (ref 3.5–5.1)

## 2013-07-01 LAB — CBC WITH DIFFERENTIAL/PLATELET
Basophils Absolute: 0.1 10*3/uL (ref 0.0–0.1)
EOS%: 4.2 % (ref 0.0–7.0)
HGB: 11.5 g/dL — ABNORMAL LOW (ref 11.6–15.9)
MCH: 31.4 pg (ref 25.1–34.0)
MONO#: 1.3 10*3/uL — ABNORMAL HIGH (ref 0.1–0.9)
NEUT#: 4.6 10*3/uL (ref 1.5–6.5)
RDW: 14 % (ref 11.2–14.5)
WBC: 9.2 10*3/uL (ref 3.9–10.3)
lymph#: 2.9 10*3/uL (ref 0.9–3.3)

## 2013-07-01 NOTE — Progress Notes (Signed)
OFFICE PROGRESS NOTE  CC  [name],[name], MD [address] [place] [address] Dr. Emmaline Kluver Dr. 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 She had undergone a robotic assisted total laparoscopic hysterectomy bilateral salpingo-oophorectomy right pelvic lymph node biopsy on 12/19/2011 without any complications. 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 There was no involvement of other organs lymphovascular invasion was not identified. 3 lymph nodes were negative for metastatic disease. 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 Patient subsequently continued to be seen by gynecologic oncology. In October 2013 patient had a Pap smear that was normal limits there was noted to be some granulation tissue that was removed from the cough. There is no evidence of disease.  #2patient was seen by Dr. Zenda Alpers on 04/29/2013 she had vaginal exam performed that was notable for a nodule in the vagina concerning for recurrence. A rectal examination revealed a firm 8 cm mass also. Because of this she had a biopsy performed. She also had a CT scan of the abdomen and pelvis ordered to further evaluate the sigmoid mass. The pathology came back inconclusive. However the CT of the abdomen and pelvis showed a lobular mass at the vagina cough measuring 7.0 x 3.2 cm proximally 4 cm craniocaudal dimension. The mass compressed the distal aspect of the sigmoid colon just above the rectum there was no mass approximates the posterior wall of the bladder without clear invasion noted evidence of  obstruction of stool proximal to this mass. There was also noted to be a large necrotic lymph node measuring 2.8 cm within the sigmoid mesocolon centrally. There are also small bilateral external iliac lymph nodes. There were no aggressive osseous lesions.   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  #4 patient was begun on palliative chemotherapy starting on 06/03/2013 with Taxol carboplatinum. Total of 6 cycles of treatment as planned.   #5 pain control: She is being managed with fentanyl patches and oxycodone.  CURRENT THERAPY: Taxol/Carbo cycle 2 day 8  INTERVAL HISTORY: Pam Vazquez 67 y.o. female returns for followup visit.  She had four good days after chemotherapy, followed by 3 days of fatigue and a washed out feeling.  The rash she had last week has resolved with benadryl.  She thinks its related to working outside.  Her pain continues to be much improved with Fentanyl and Oxycodone if needed.  She has had mild constipation, however has managed it with OTC stool softeners and milk of magnesia without difficulty.  Otherwise, a 10 point ROS is neg.   MEDICAL HISTORY: Past Medical History  Diagnosis Date  . Diabetes mellitus   . PONV (postoperative nausea and vomiting)   . Cancer     endometrial  . Angina     stress test on chart from 3/12/ none since March 2012  . Chronic kidney disease     kidney stones  . Hypertension     / EKG 1/13 Epic    ALLERGIES:  has No Known Allergies.  MEDICATIONS:  Current Outpatient Prescriptions  Medication Sig Dispense Refill  . amLODipine (NORVASC)  2.5 MG tablet Take 2.5 mg by mouth every morning.       . cyanocobalamin 2000 MCG tablet Take 2,000 mcg by mouth 2 (two) times daily.      [name] dexamethasone (DECADRON) 4 MG tablet Take 2 tablets (8 mg total) by mouth 2 (two) times daily with a meal. Take two times a day starting the day after  chemotherapy for 3 days.  30 tablet  1  . doxycycline (VIBRA-TABS) 100 MG tablet Take 100 mg by mouth every other day.      . fentaNYL (DURAGESIC - DOSED MCG/HR) 25 MCG/HR patch Place 1 patch (25 mcg total) onto the skin every 3 (three) days.  10 patch  0  . lidocaine-prilocaine (EMLA) cream Apply topically as needed.  30 g  6  . lisinopril-hydrochlorothiazide (PRINZIDE,ZESTORETIC) 20-25 MG per tablet Take 1 tablet by mouth daily after breakfast.       . LORazepam (ATIVAN) 0.5 MG tablet Take 1 tablet (0.5 mg total) by mouth every 6 (six) hours as needed (Nausea or vomiting).  30 tablet  0  . metFORMIN (GLUCOPHAGE) 500 MG tablet Take 500 mg by mouth every morning.       . metoprolol tartrate (LOPRESSOR) 25 MG tablet Take 25 mg by mouth 2 (two) times daily.       . Multiple Vitamin (MULTIVITAMIN WITH MINERALS) TABS Take 1 tablet by mouth every morning. She takes Surveyor, quantity.      . ondansetron (ZOFRAN) 8 MG tablet Take 1 tablet (8 mg total) by mouth 2 (two) times daily. Take two times a day starting the day after chemo for 3 days. Then take two times a day as needed for nausea or vomiting.  30 tablet  1  . Oxycodone HCl 10 MG TABS Take 1 tablet (10 mg total) by mouth every 4 (four) hours as needed (for moderate to severe pain).  80 tablet  0  . prochlorperazine (COMPAZINE) 10 MG tablet Take 1 tablet (10 mg total) by mouth every 6 (six) hours as needed (Nausea or vomiting).  30 tablet  1  . prochlorperazine (COMPAZINE) 25 MG suppository Place 1 suppository (25 mg total) rectally every 12 (twelve) hours as needed for nausea.  12 suppository  3   No current facility-administered medications for this visit.    SURGICAL HISTORY:  Past Surgical History  Procedure Laterality Date  . Hysteroscopy w/d&c  11/21/2011    Procedure: DILATATION AND CURETTAGE /HYSTEROSCOPY;  Surgeon: Miguel Aschoff;  Location: [facility];  Service: Gynecology;  Laterality: N/A;  . Tubal ligation    . Breast mass removal  1984, 1992     L breast  . Lithotripsy  2000, 2002  . Back surgery      91 and 2001  . Dilation and curettage of uterus    . Node dissection  12/19/2011    Procedure: NODE DISSECTION;  Surgeon: Laurette Schimke, MD PHD;  Location: [facility];  Service: Gynecology;  Laterality: N/A;    REVIEW OF SYSTEMS:  Pertinent items are noted in HPI.   PHYSICAL EXAMINATION: Blood pressure 90/61, pulse 89, temperature 98.3 F (36.8 C), temperature source Oral, resp. rate 20, height 5' (1.524 m), weight 147 lb 4.8 oz (66.815 kg). Body mass index is 28.77 kg/(m^2). General: Patient is a well appearing female in no acute distress HEENT: PERRLA, sclerae anicteric no conjunctival pallor, MMM Neck: supple, no palpable adenopathy Lungs: clear to auscultation bilaterally, no wheezes, rhonchi, or rales Cardiovascular: regular rate rhythm, S1,  S2, no murmurs, rubs or gallops Abdomen: Soft, non-tender, non-distended, normoactive bowel sounds, no HSM Extremities: warm and well perfused, no clubbing, cyanosis, or edema Skin: No rashes or lesions Neuro: Non-focal ECOG PERFORMANCE STATUS: 0 - Asymptomatic  LABORATORY DATA: Lab Results  Component Value Date   WBC 9.2 07/01/2013   HGB 11.5* 07/01/2013   HCT 33.9* 07/01/2013   MCV 92.3 07/01/2013   PLT 238 07/01/2013      Chemistry      Component Value Date/Time   NA 140 06/24/2013 0858   NA 141 12/20/2011 0334   K 4.2 06/24/2013 0858   K 4.1 12/20/2011 0334   CL 104 04/29/2013 1229   CL 104 12/20/2011 0334   CO2 25 06/24/2013 0858   CO2 27 12/20/2011 0334   BUN 13.7 06/24/2013 0858   BUN 10 12/20/2011 0334   CREATININE 1.1 06/24/2013 0858   CREATININE 0.80 12/20/2011 0334      Component Value Date/Time   CALCIUM 11.1* 06/24/2013 0858   CALCIUM 9.5 12/20/2011 0334   ALKPHOS 76 06/24/2013 0858   ALKPHOS 69 12/15/2011 1125   AST 15 06/24/2013 0858   AST 22 12/15/2011 1125   ALT 15 06/24/2013 0858   ALT 19 12/15/2011 1125   BILITOT 0.53 06/24/2013 0858   BILITOT 0.8 12/15/2011 1125     ADDITIONAL INFORMATION: The malignant cells are positive for estrogen receptor, progesterone receptor, cytokeratin 7 and focally positive for CDX-2. They are negative for cytokeratin 20. Given the clinical history as well as the histologic features, the findings are consistent with metastatic endometrioid adenocarcinoma. (JBK:kh 05-19-13) Pecola Leisure MD Pathologist, Electronic Signature ( Signed 05/19/2013) FINAL DIAGNOSIS Diagnosis Pelvis, biopsy, Central lower - METASTATIC CARCINOMA WITH ASSOCIATED TUMOR NECROSIS. Microscopic Comment Immunostains will be performed and an addendum report will follow. (HCL:kh 05-15-13) Abigail Miyamoto MD Pathologist, Electronic Signature (Case signed 05/15/2013) Specimen Gross and Clinical Information Specimen(s) Obtained: Pelvis, biopsy, Central lower Specimen Clinical Information Recurrent cancer (tl) Gross Received in formalin are fragments and cores of soft white tissue having an aggregate measurement of 1.3 x 0.5 x 0.1 cm. The specimen is submitted in toto. (GRP: ecj 05/14/2013) 1 of 2 Duplicate copy FINAL for Fredricksen, Chandani P ([id number]) Stain(s) used in Diagnosis: The following stain(s) were used in diagnosing the case: PR - NOACIS, ER - NOACIS, CK-7, CDX-2, CK 20. The control(s) stained appropriately. Disc   RADIOGRAPHIC STUDIES:  Dg Chest 2 View  05/06/2013   *RADIOLOGY REPORT*  Clinical Data: Endometrial cancer  CHEST - 2 VIEW  Comparison: December 15, 2011.  Findings: Cardiomediastinal silhouette appears normal.  No acute pulmonary disease is noted.  Bony thorax is intact.  Stable small dense nodule seen in right lower lobe compared to prior exam most consistent with granuloma.  IMPRESSION: Stable small nodule seen in right lower lobe most consistent with granuloma.  No acute cardiopulmonary abnormality seen.   Original Report Authenticated By: Lupita Raider.,  M.D.   Ct Abdomen Pelvis W Contrast  05/05/2013   *RADIOLOGY REPORT*   Clinical Data: Pelvic pain.  History of endometrial carcinoma.  CT ABDOMEN AND PELVIS WITH CONTRAST  Technique:  Multidetector CT imaging of the abdomen and pelvis was performed following the standard protocol during bolus administration of intravenous contrast.  Contrast: OMNIPAQUE IOHEXOL 300 MG/ML  SOLN  Comparison: CT 01/05/2006  Findings: Lung bases are clear.  No pericardial fluid.  No focal hepatic lesion.  The gallbladder, pancreas, spleen, adrenal glands, kidneys are normal.  There is a cortical scarring of the right kidney.  No hydronephrosis.  The stomach, small bowel, appendix, cecum are normal.  The colon is normal to the level of the descending colon.  There is a lobular mass at the vaginal cuff measuring 7.0 x 3.2 cm in axial dimension and approximately 4 cm craniocaudad dimension. There is a mass compresses the distal aspect of the sigmoid colon just above the rectum (image 62, axial, image 57, coronal series). No mass approximates the posterior wall the bladder without clear invasion .There is no evidence of obstruction of stool proximal to this mass compressing the sigmoid colon.  There is a large necrotic lymph node measuring 2.8 cm within the sigmoid mesocolon centrally (60).  Patient status post hysterectomy and oophorectomy.  Small bilateral external iliac lymph nodes.  For example 8 mm node on the right and 5 mm node on the left (image 59).  There is no periaortic adenopathy.  No periportal or mesenteric adenopathy  Review of the bone windows demonstrates post lumbar  fusion.  No aggressive osseous lesions.  IMPRESSION:  1.  Lobular mass at the vaginal cuff is consistent with endometrial carcinoma progression.  2.  Mass extends to the serosal surface of the sigmoid colon and compresses the lumen of the sigmoid colon.  No obstruction at this time.  3.  Mass  approximates the posterior wall of the bladder without clear invasion.  4. Large necrotic lymph node within the sigmoid mesocolon  superior to the vaginal cuff.  5.  Small bilateral iliac lymph nodes.   Original Report Authenticated By: Genevive Bi, M.D.   Nm Pet Image Restag (ps) Skull Base To Thigh  05/09/2013   *RADIOLOGY REPORT*  Clinical Data: Subsequent treatment strategy for endometrial cancer. Hysterectomy 12/19/2011.  Recurrent pelvic masses on the CT.  NUCLEAR MEDICINE PET SKULL BASE TO THIGH  Fasting Blood Glucose:  141  Technique:  18.3 mCi F-18 FDG was injected intravenously. CT data was obtained and used for attenuation correction and anatomic localization only.  (This was not acquired as a diagnostic CT examination.) Additional exam technical data entered on technologist worksheet.  Comparison:  Abdominal pelvic CT 05/05/2013.  Findings:  Neck: No hypermetabolic lymph nodes in the neck.  Chest:  There are no hypermetabolic mediastinal or hilar lymph nodes.  There is no abnormal metabolic activity within the lungs. CT images demonstrate mild atherosclerosis.  There are no suspicious pulmonary findings.  Abdomen/Pelvis:  There is no abnormal metabolic activity within the liver, adrenal glands, spleen or pancreas.  No hypermetabolic lymph nodes are identified within the abdomen.  However, the large lobulated mass contiguous with the vaginal cuff is hypermetabolic. This demonstrates an SUV max of approximately 24.5.  The mass measures up to 6.8 x 3.6 cm transverse on image 186.  The necrotic nodal mass within the sigmoid the mesocolon is also hypermetabolic. This measures 3.1 cm on image 175 and demonstrates an SUV max of 17.1.  The right external iliac and pelvic sidewall lymph nodes demonstrated on the recent CT are also hypermetabolic.  The largest node measures 2.0 cm on image 176 and has an SUV max of 17.1.  No hypermetabolic left pelvic side wall nodes are identified.  There is no generalized peritoneal hypermetabolic activity or ascites. Scattered activity in the colon is likely physiologic.  The right kidney  demonstrates lobularity and scarring.  Skeleton:  No focal hypermetabolic activity to suggest skeletal metastasis.  IMPRESSION:  1.  Multifocal recurrence within the pelvis  involving the vaginal cuff, mesenteric and right pelvic sidewall lymph nodes. 2.  No distant metastases identified.   Original Report Authenticated By: Carey Bullocks, M.D.   Ct Biopsy  05/14/2013   *RADIOLOGY REPORT*  Clinical history:67 year old with history of endometrial cancer and suspicious lesions in the pelvis.  PROCEDURE(S): CT GUIDED BIOPSY OF PELVIC MASS  Physician: Rachelle Hora. Henn, MD  Medications:Versed 2 mg, Fentanyl 100 mcg.  A radiology nurse monitored the patient for moderate sedation.  Moderate sedation time:28 minutes  Procedure:The procedure was explained to the patient.  The risks and benefits of the procedure were discussed and the patient's questions were addressed.  Informed consent was obtained from the patient.  The patient was placed prone on the CT scanner.  Images of the pelvis were obtained.  The left gluteal area was prepped and draped in a sterile fashion.  Skin and soft tissues were anesthetized with lidocaine.  17 gauge needle was directed into the pelvic mass from a left transgluteal approach.  Needle position confirmed along the posterior aspect of the lesion.  Three core biopsies were obtained with an 18 gauge device.  Samples placed in formalin.  17 gauge needle was removed without complication.  Findings:There is a pelvic mass situated adjacent to the vaginal cuff.  The needle was positioned along the posterior aspect of the lesion.  Complications: None  Impression:CT guided core biopsies of the pelvic mass.   Original Report Authenticated By: Richarda Overlie, M.D.    ASSESSMENT: 67 year old female with  #1 recurrent endometrioid adenocarcinoma of the uterus. Patient is gong to receive palliative chemotherapy consisting of Taxol and carboplatinum. I discussed this with the patient and her daughters. We  discussed the rationale for this. We discussed risks benefits and potential side effects.  #2 patient underwent Port-A-Cath placement by interventional radiology and underwent chemotherapy teaching class.   #3 patient was begun on Taxol and carboplatinum starting on 06/03/2013. She is now here for cycle #2 day 8 of her chemotherapy.   #4 pain control patient is on fentanyl patches as well as oxycodone. Her pain is really well controlled.  #5 rash unclear etiology I have recommended she continue the Zyrtec/Claritin. She also continued to take Benadryl as needed postchemotherapy.  This is resolved today, 07/01/13.  #6 patient is recommended to exercise and eat a healthy diet.   PLAN:  #1 Other than fatigue, the patient is doing well after treatment.  Her labs are stable and I reviewed these with her in detail.    #2  Patient's pain continues to be very well controlled with Fentanyl and occasional Oxycodone.    #3 Her rash has resolved since taking Benadryl.  We will monitor for its re-occurrence.    #4 She will return in 2 weeks for her next cycle of chemotherapy.    All questions were answered. The patient knows to call the clinic with any problems, questions or concerns. We can certainly see the patient much sooner if necessary.  I spent 25 minutes counseling the patient face to face. The total time spent in the appointment was 30 minutes.  Cherie Ouch Lyn Hollingshead, NP Medical Oncology [facility] [place] Phone: [phone] 07/01/2013, 11:02 AM

## 2013-07-15 ENCOUNTER — Telehealth: Payer: Self-pay | Admitting: *Deleted

## 2013-07-15 ENCOUNTER — Ambulatory Visit (HOSPITAL_BASED_OUTPATIENT_CLINIC_OR_DEPARTMENT_OTHER): Payer: Medicare Other

## 2013-07-15 ENCOUNTER — Telehealth: Payer: Self-pay | Admitting: Oncology

## 2013-07-15 ENCOUNTER — Ambulatory Visit: Payer: Medicare Other | Admitting: Nutrition

## 2013-07-15 ENCOUNTER — Ambulatory Visit (HOSPITAL_BASED_OUTPATIENT_CLINIC_OR_DEPARTMENT_OTHER): Payer: Medicare Other | Admitting: Oncology

## 2013-07-15 ENCOUNTER — Other Ambulatory Visit (HOSPITAL_BASED_OUTPATIENT_CLINIC_OR_DEPARTMENT_OTHER): Payer: Medicare Other | Admitting: Lab

## 2013-07-15 VITALS — BP 151/82 | HR 128 | Temp 97.3°F | Resp 20 | Ht 60.0 in | Wt 152.4 lb

## 2013-07-15 DIAGNOSIS — R000 Tachycardia, unspecified: Secondary | ICD-10-CM

## 2013-07-15 DIAGNOSIS — C549 Malignant neoplasm of corpus uteri, unspecified: Secondary | ICD-10-CM

## 2013-07-15 DIAGNOSIS — E860 Dehydration: Secondary | ICD-10-CM

## 2013-07-15 DIAGNOSIS — R109 Unspecified abdominal pain: Secondary | ICD-10-CM

## 2013-07-15 DIAGNOSIS — C541 Malignant neoplasm of endometrium: Secondary | ICD-10-CM

## 2013-07-15 DIAGNOSIS — D696 Thrombocytopenia, unspecified: Secondary | ICD-10-CM

## 2013-07-15 LAB — CBC WITH DIFFERENTIAL/PLATELET
BASO%: 0.1 % (ref 0.0–2.0)
EOS%: 0 % (ref 0.0–7.0)
HGB: 10.3 g/dL — ABNORMAL LOW (ref 11.6–15.9)
MCH: 31.3 pg (ref 25.1–34.0)
MCHC: 33.1 g/dL (ref 31.5–36.0)
RDW: 16.5 % — ABNORMAL HIGH (ref 11.2–14.5)
lymph#: 0.7 10*3/uL — ABNORMAL LOW (ref 0.9–3.3)
nRBC: 0 % (ref 0–0)

## 2013-07-15 LAB — COMPREHENSIVE METABOLIC PANEL (CC13)
AST: 15 U/L (ref 5–34)
Albumin: 3.4 g/dL — ABNORMAL LOW (ref 3.5–5.0)
Alkaline Phosphatase: 77 U/L (ref 40–150)
Potassium: 3.8 mEq/L (ref 3.5–5.1)
Sodium: 143 mEq/L (ref 136–145)
Total Protein: 7 g/dL (ref 6.4–8.3)

## 2013-07-15 MED ORDER — SODIUM CHLORIDE 0.9 % IV SOLN
Freq: Once | INTRAVENOUS | Status: DC
  Administered 2013-07-15: 11:00:00 via INTRAVENOUS

## 2013-07-15 MED ORDER — FENTANYL 25 MCG/HR TD PT72: 1.0000 | MEDICATED_PATCH | TRANSDERMAL | Status: DC

## 2013-07-15 NOTE — Telephone Encounter (Signed)
Per staff message and POF I have scheduled appts.  JMW  

## 2013-07-15 NOTE — Progress Notes (Signed)
Chemo cancelled today per Dr. Welton Flakes.  IVF ordered and administered.

## 2013-07-15 NOTE — Progress Notes (Signed)
OFFICE PROGRESS NOTE  CC  [name],[name], MD [address] [place] [address] Dr. Emmaline Kluver Dr. 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 She had undergone a robotic assisted total laparoscopic hysterectomy bilateral salpingo-oophorectomy right pelvic lymph node biopsy on 12/19/2011 without any complications. 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 There was no involvement of other organs lymphovascular invasion was not identified. 3 lymph nodes were negative for metastatic disease. 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 Patient subsequently continued to be seen by gynecologic oncology. In October 2013 patient had a Pap smear that was normal limits there was noted to be some granulation tissue that was removed from the cough. There is no evidence of disease.  #2patient was seen by Dr. Zenda Alpers on 04/29/2013 she had vaginal exam performed that was notable for a nodule in the vagina concerning for recurrence. A rectal examination revealed a firm 8 cm mass also. Because of this she had a biopsy performed. She also had a CT scan of the abdomen and pelvis ordered to further evaluate the sigmoid mass. The pathology came back inconclusive. However the CT of the abdomen and pelvis showed a lobular mass at the vagina cough measuring 7.0 x 3.2 cm proximally 4 cm craniocaudal dimension. The mass compressed the distal aspect of the sigmoid colon just above the rectum there was no mass approximates the posterior wall of the bladder without clear invasion noted evidence of  obstruction of stool proximal to this mass. There was also noted to be a large necrotic lymph node measuring 2.8 cm within the sigmoid mesocolon centrally. There are also small bilateral external iliac lymph nodes. There were no aggressive osseous lesions.   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  #4 patient was begun on palliative chemotherapy starting on 06/03/2013 with Taxol carboplatinum. Total of 6 cycles of treatment as planned.   #5 pain control: She is being managed with fentanyl patches and oxycodone.  CURRENT THERAPY: Taxol/Carbo cycle 3 ( to be held today)  INTERVAL HISTORY: Pam Vazquez 67 y.o. female returns for followup visit.  She is complaining of having some shortness of breath with some weakness towards the end of her chemotherapy last time. She did have a episode of coughing earlier today. She thinks it may be allergies. She is recommended taking Claritin she has also been having significant night sweats and hot flashes. She also notices ongoing left knee pain. She denies having any abdominal pain no bleeding problems. No nausea or vomiting no peripheral paresthesias. Remainder of the 10 point review of systems is negative  MEDICAL HISTORY: Past Medical History  Diagnosis Date  . Diabetes mellitus   . PONV (postoperative nausea and vomiting)   . Cancer     endometrial  . Angina     stress test on chart from 3/12/ none since March 2012  . Chronic kidney disease     kidney stones  . Hypertension     / EKG 1/13 Epic    ALLERGIES:  has No Known Allergies.  MEDICATIONS:  Current Outpatient Prescriptions  Medication  Sig Dispense Refill  . amLODipine (NORVASC) 2.5 MG tablet Take 2.5 mg by mouth every morning.       . cyanocobalamin 2000 MCG tablet Take 2,000 mcg by mouth 2 (two) times daily.      [name] dexamethasone (DECADRON) 4 MG tablet Take 2 tablets (8 mg total) by  mouth 2 (two) times daily with a meal. Take two times a day starting the day after chemotherapy for 3 days.  30 tablet  1  . doxycycline (VIBRA-TABS) 100 MG tablet Take 100 mg by mouth every other day.      . fentaNYL (DURAGESIC - DOSED MCG/HR) 25 MCG/HR patch Place 1 patch (25 mcg total) onto the skin every 3 (three) days.  10 patch  0  . lidocaine-prilocaine (EMLA) cream Apply topically as needed.  30 g  6  . lisinopril-hydrochlorothiazide (PRINZIDE,ZESTORETIC) 20-25 MG per tablet Take 1 tablet by mouth daily after breakfast.       . metFORMIN (GLUCOPHAGE) 500 MG tablet Take 500 mg by mouth every morning.       . metoprolol tartrate (LOPRESSOR) 25 MG tablet Take 25 mg by mouth 2 (two) times daily.       . Multiple Vitamin (MULTIVITAMIN WITH MINERALS) TABS Take 1 tablet by mouth every morning. She takes Surveyor, quantity.      [name] LORazepam (ATIVAN) 0.5 MG tablet Take 1 tablet (0.5 mg total) by mouth every 6 (six) hours as needed (Nausea or vomiting).  30 tablet  0  . ondansetron (ZOFRAN) 8 MG tablet Take 1 tablet (8 mg total) by mouth 2 (two) times daily. Take two times a day starting the day after chemo for 3 days. Then take two times a day as needed for nausea or vomiting.  30 tablet  1  . Oxycodone HCl 10 MG TABS Take 1 tablet (10 mg total) by mouth every 4 (four) hours as needed (for moderate to severe pain).  80 tablet  0  . prochlorperazine (COMPAZINE) 10 MG tablet Take 1 tablet (10 mg total) by mouth every 6 (six) hours as needed (Nausea or vomiting).  30 tablet  1  . prochlorperazine (COMPAZINE) 25 MG suppository Place 1 suppository (25 mg total) rectally every 12 (twelve) hours as needed for nausea.  12 suppository  3   Current Facility-Administered Medications  Medication Dose Route Frequency Provider Last Rate Last Dose  . 0.9 %  sodium chloride infusion   Intravenous Once Victorino December, MD        SURGICAL HISTORY:  Past Surgical History  Procedure Laterality Date  . Hysteroscopy w/d&c   11/21/2011    Procedure: DILATATION AND CURETTAGE /HYSTEROSCOPY;  Surgeon: Miguel Aschoff;  Location: [facility];  Service: Gynecology;  Laterality: N/A;  . Tubal ligation    . Breast mass removal  1984, 1992    L breast  . Lithotripsy  2000, 2002  . Back surgery      91 and 2001  . Dilation and curettage of uterus    . Node dissection  12/19/2011    Procedure: NODE DISSECTION;  Surgeon: Laurette Schimke, MD PHD;  Location: [facility];  Service: Gynecology;  Laterality: N/A;    REVIEW OF SYSTEMS:  Pertinent items are noted in HPI.   PHYSICAL EXAMINATION: Blood pressure 151/82, pulse 128, temperature 97.3 F (36.3 C), temperature source Oral, resp. rate 20, height 5' (1.524 m), weight 152 lb 6.4 oz (69.128 kg). Body mass index is 29.76 kg/(m^2). General: Patient is a well appearing  female in no acute distress HEENT: PERRLA, sclerae anicteric no conjunctival pallor, MMM Neck: supple, no palpable adenopathy Lungs: clear to auscultation bilaterally, no wheezes, rhonchi, or rales Cardiovascular: regular rate rhythm, S1, S2, no murmurs, rubs or gallops Abdomen: Soft, non-tender, non-distended, normoactive bowel sounds, no HSM Extremities: warm and well perfused, no clubbing, cyanosis, or edema Skin: No rashes or lesions Neuro: Non-focal ECOG PERFORMANCE STATUS: 0 - Asymptomatic  LABORATORY DATA: Lab Results  Component Value Date   WBC 7.5 07/15/2013   HGB 10.3* 07/15/2013   HCT 31.1* 07/15/2013   MCV 94.5 07/15/2013   PLT 93* 07/15/2013      Chemistry      Component Value Date/Time   NA 140 07/01/2013 1017   NA 141 12/20/2011 0334   K 4.5 07/01/2013 1017   K 4.1 12/20/2011 0334   CL 104 04/29/2013 1229   CL 104 12/20/2011 0334   CO2 25 07/01/2013 1017   CO2 27 12/20/2011 0334   BUN 36.1* 07/01/2013 1017   BUN 10 12/20/2011 0334   CREATININE 1.0 07/01/2013 1017   CREATININE 0.80 12/20/2011 0334      Component Value Date/Time   CALCIUM 10.8* 07/01/2013 1017   CALCIUM 9.5 12/20/2011 0334   ALKPHOS 99 07/01/2013 1017    ALKPHOS 69 12/15/2011 1125   AST 15 07/01/2013 1017   AST 22 12/15/2011 1125   ALT 20 07/01/2013 1017   ALT 19 12/15/2011 1125   BILITOT 0.38 07/01/2013 1017   BILITOT 0.8 12/15/2011 1125    ADDITIONAL INFORMATION: The malignant cells are positive for estrogen receptor, progesterone receptor, cytokeratin 7 and focally positive for CDX-2. They are negative for cytokeratin 20. Given the clinical history as well as the histologic features, the findings are consistent with metastatic endometrioid adenocarcinoma. (JBK:kh 05-19-13) Pecola Leisure MD Pathologist, Electronic Signature ( Signed 05/19/2013) FINAL DIAGNOSIS Diagnosis Pelvis, biopsy, Central lower - METASTATIC CARCINOMA WITH ASSOCIATED TUMOR NECROSIS. Microscopic Comment Immunostains will be performed and an addendum report will follow. (HCL:kh 05-15-13) Abigail Miyamoto MD Pathologist, Electronic Signature (Case signed 05/15/2013) Specimen Gross and Clinical Information Specimen(s) Obtained: Pelvis, biopsy, Central lower Specimen Clinical Information Recurrent cancer (tl) Gross Received in formalin are fragments and cores of soft white tissue having an aggregate measurement of 1.3 x 0.5 x 0.1 cm. The specimen is submitted in toto. (GRP: ecj 05/14/2013) 1 of 2 Duplicate copy FINAL for Ivan, Toleen P ([id number]) Stain(s) used in Diagnosis: The following stain(s) were used in diagnosing the case: PR - NOACIS, ER - NOACIS, CK-7, CDX-2, CK 20. The control(s) stained appropriately. Disc   RADIOGRAPHIC STUDIES:  Dg Chest 2 View  05/06/2013   *RADIOLOGY REPORT*  Clinical Data: Endometrial cancer  CHEST - 2 VIEW  Comparison: December 15, 2011.  Findings: Cardiomediastinal silhouette appears normal.  No acute pulmonary disease is noted.  Bony thorax is intact.  Stable small dense nodule seen in right lower lobe compared to prior exam most consistent with granuloma.  IMPRESSION: Stable small nodule seen in right lower lobe most consistent with  granuloma.  No acute cardiopulmonary abnormality seen.   Original Report Authenticated By: Lupita Raider.,  M.D.   Ct Abdomen Pelvis W Contrast  05/05/2013   *RADIOLOGY REPORT*  Clinical Data: Pelvic pain.  History of endometrial carcinoma.  CT ABDOMEN AND PELVIS WITH CONTRAST  Technique:  Multidetector CT imaging of the abdomen and pelvis was performed following the standard protocol during bolus administration of intravenous contrast.  Contrast: OMNIPAQUE IOHEXOL 300  MG/ML  SOLN  Comparison: CT 01/05/2006  Findings: Lung bases are clear.  No pericardial fluid.  No focal hepatic lesion.  The gallbladder, pancreas, spleen, adrenal glands, kidneys are normal.  There is a cortical scarring of the right kidney.  No hydronephrosis.  The stomach, small bowel, appendix, cecum are normal.  The colon is normal to the level of the descending colon.  There is a lobular mass at the vaginal cuff measuring 7.0 x 3.2 cm in axial dimension and approximately 4 cm craniocaudad dimension. There is a mass compresses the distal aspect of the sigmoid colon just above the rectum (image 62, axial, image 57, coronal series). No mass approximates the posterior wall the bladder without clear invasion .There is no evidence of obstruction of stool proximal to this mass compressing the sigmoid colon.  There is a large necrotic lymph node measuring 2.8 cm within the sigmoid mesocolon centrally (60).  Patient status post hysterectomy and oophorectomy.  Small bilateral external iliac lymph nodes.  For example 8 mm node on the right and 5 mm node on the left (image 59).  There is no periaortic adenopathy.  No periportal or mesenteric adenopathy  Review of the bone windows demonstrates post lumbar  fusion.  No aggressive osseous lesions.  IMPRESSION:  1.  Lobular mass at the vaginal cuff is consistent with endometrial carcinoma progression.  2.  Mass extends to the serosal surface of the sigmoid colon and compresses the lumen of the  sigmoid colon.  No obstruction at this time.  3.  Mass  approximates the posterior wall of the bladder without clear invasion.  4. Large necrotic lymph node within the sigmoid mesocolon superior to the vaginal cuff.  5.  Small bilateral iliac lymph nodes.   Original Report Authenticated By: Genevive Bi, M.D.   Nm Pet Image Restag (ps) Skull Base To Thigh  05/09/2013   *RADIOLOGY REPORT*  Clinical Data: Subsequent treatment strategy for endometrial cancer. Hysterectomy 12/19/2011.  Recurrent pelvic masses on the CT.  NUCLEAR MEDICINE PET SKULL BASE TO THIGH  Fasting Blood Glucose:  141  Technique:  18.3 mCi F-18 FDG was injected intravenously. CT data was obtained and used for attenuation correction and anatomic localization only.  (This was not acquired as a diagnostic CT examination.) Additional exam technical data entered on technologist worksheet.  Comparison:  Abdominal pelvic CT 05/05/2013.  Findings:  Neck: No hypermetabolic lymph nodes in the neck.  Chest:  There are no hypermetabolic mediastinal or hilar lymph nodes.  There is no abnormal metabolic activity within the lungs. CT images demonstrate mild atherosclerosis.  There are no suspicious pulmonary findings.  Abdomen/Pelvis:  There is no abnormal metabolic activity within the liver, adrenal glands, spleen or pancreas.  No hypermetabolic lymph nodes are identified within the abdomen.  However, the large lobulated mass contiguous with the vaginal cuff is hypermetabolic. This demonstrates an SUV max of approximately 24.5.  The mass measures up to 6.8 x 3.6 cm transverse on image 186.  The necrotic nodal mass within the sigmoid the mesocolon is also hypermetabolic. This measures 3.1 cm on image 175 and demonstrates an SUV max of 17.1.  The right external iliac and pelvic sidewall lymph nodes demonstrated on the recent CT are also hypermetabolic.  The largest node measures 2.0 cm on image 176 and has an SUV max of 17.1.  No hypermetabolic left pelvic  side wall nodes are identified.  There is no generalized peritoneal hypermetabolic activity or ascites. Scattered activity in the  colon is likely physiologic.  The right kidney demonstrates lobularity and scarring.  Skeleton:  No focal hypermetabolic activity to suggest skeletal metastasis.  IMPRESSION:  1.  Multifocal recurrence within the pelvis involving the vaginal cuff, mesenteric and right pelvic sidewall lymph nodes. 2.  No distant metastases identified.   Original Report Authenticated By: Carey Bullocks, M.D.   Ct Biopsy  05/14/2013   *RADIOLOGY REPORT*  Clinical history:67 year old with history of endometrial cancer and suspicious lesions in the pelvis.  PROCEDURE(S): CT GUIDED BIOPSY OF PELVIC MASS  Physician: Rachelle Hora. Henn, MD  Medications:Versed 2 mg, Fentanyl 100 mcg.  A radiology nurse monitored the patient for moderate sedation.  Moderate sedation time:28 minutes  Procedure:The procedure was explained to the patient.  The risks and benefits of the procedure were discussed and the patient's questions were addressed.  Informed consent was obtained from the patient.  The patient was placed prone on the CT scanner.  Images of the pelvis were obtained.  The left gluteal area was prepped and draped in a sterile fashion.  Skin and soft tissues were anesthetized with lidocaine.  17 gauge needle was directed into the pelvic mass from a left transgluteal approach.  Needle position confirmed along the posterior aspect of the lesion.  Three core biopsies were obtained with an 18 gauge device.  Samples placed in formalin.  17 gauge needle was removed without complication.  Findings:There is a pelvic mass situated adjacent to the vaginal cuff.  The needle was positioned along the posterior aspect of the lesion.  Complications: None  Impression:CT guided core biopsies of the pelvic mass.   Original Report Authenticated By: Richarda Overlie, M.D.    ASSESSMENT: 67 year old female with  #1 recurrent endometrioid  adenocarcinoma of the uterus. Patient is gong to receive palliative chemotherapy consisting of Taxol and carboplatinum. I discussed this with the patient and her daughters. We discussed the rationale for this. We discussed risks benefits and potential side effects.  #2 patient underwent Port-A-Cath placement by interventional radiology and underwent chemotherapy teaching class.   #3 patient was begun on Taxol and carboplatinum starting on 06/03/2013.   #4 pain control patient is on fentanyl patches as well as oxycodone. Her pain is really well controlled.  #5 rash unclear etiology I have recommended she continue the Zyrtec/Claritin. She also continued to take Benadryl as needed postchemotherapy.  This is resolved today, 07/01/13.  #6 patient is recommended to exercise and eat a healthy diet.  #7 Thrombocytopenia secondary to chemotherapy   PLAN:  #1 patient is thrombocytopenic today do to prior chemotherapy. We will hold her chemotherapy today.  #2 patient is tachycardic and dehydrated. I will give her IV fluids today.  #3 she will return in one week's time for cycle 3 of Taxol carboplatinum   All questions were answered. The patient knows to call the clinic with any problems, questions or concerns. We can certainly see the patient much sooner if necessary.  I spent 25 minutes counseling the patient face to face. The total time spent in the appointment was 30 minutes.

## 2013-07-15 NOTE — Patient Instructions (Addendum)
Dehydration, Adult Dehydration is when you lose more fluids from the body than you take in. Vital organs like the kidneys, brain, and heart cannot function without a proper amount of fluids and salt. Any loss of fluids from the body can cause dehydration.  CAUSES   Vomiting.  Diarrhea.  Excessive sweating.  Excessive urine output.  Fever. SYMPTOMS  Mild dehydration  Thirst.  Dry lips.  Slightly dry mouth. Moderate dehydration  Very dry mouth.  Sunken eyes.  Skin does not bounce back quickly when lightly pinched and released.  Dark urine and decreased urine production.  Decreased tear production.  Headache. Severe dehydration  Very dry mouth.  Extreme thirst.  Rapid, weak pulse (more than 100 beats per minute at rest).  Cold hands and feet.  Not able to sweat in spite of heat and temperature.  Rapid breathing.  Blue lips.  Confusion and lethargy.  Difficulty being awakened.  Minimal urine production.  No tears. DIAGNOSIS  Your caregiver will diagnose dehydration based on your symptoms and your exam. Blood and urine tests will help confirm the diagnosis. The diagnostic evaluation should also identify the cause of dehydration. TREATMENT  Treatment of mild or moderate dehydration can often be done at home by increasing the amount of fluids that you drink. It is best to drink small amounts of fluid more often. Drinking too much at one time can make vomiting worse. Refer to the home care instructions below. Severe dehydration needs to be treated at the hospital where you will probably be given intravenous (IV) fluids that contain water and electrolytes. HOME CARE INSTRUCTIONS   Ask your caregiver about specific rehydration instructions.  Drink enough fluids to keep your urine clear or pale yellow.  Drink small amounts frequently if you have nausea and vomiting.  Eat as you normally do.  Avoid:  Foods or drinks high in sugar.  Carbonated  drinks.  Juice.  Extremely hot or cold fluids.  Drinks with caffeine.  Fatty, greasy foods.  Alcohol.  Tobacco.  Overeating.  Gelatin desserts.  Wash your hands well to avoid spreading bacteria and viruses.  Only take over-the-counter or prescription medicines for pain, discomfort, or fever as directed by your caregiver.  Ask your caregiver if you should continue all prescribed and over-the-counter medicines.  Keep all follow-up appointments with your caregiver. SEEK MEDICAL CARE IF:  You have abdominal pain and it increases or stays in one area (localizes).  You have a rash, stiff neck, or severe headache.  You are irritable, sleepy, or difficult to awaken.  You are weak, dizzy, or extremely thirsty. SEEK IMMEDIATE MEDICAL CARE IF:   You are unable to keep fluids down or you get worse despite treatment.  You have frequent episodes of vomiting or diarrhea.  You have blood or green matter (bile) in your vomit.  You have blood in your stool or your stool looks black and tarry.  You have not urinated in 6 to 8 hours, or you have only urinated a small amount of very dark urine.  You have a fever.  You faint. MAKE SURE YOU:   Understand these instructions.  Will watch your condition.  Will get help right away if you are not doing well or get worse. Document Released: 10/30/2005 Document Revised: 01/22/2012 Document Reviewed: 06/19/2011 [employer] Patient Information 2014 [employer].  

## 2013-07-15 NOTE — Progress Notes (Signed)
Patient's weight has increased slightly to 152.4 pounds on September 2.  Noted increased glucose of 336 on September 2.  Patient reports her appetite has improved.  She continues to consume sweetened beverages such as juices and sweet tea. Patient states she doesn't like plain water.  Patient receiving IV fluids today.  Nutrition diagnosis: Unintended weight loss improved.  Intervention: Patient was educated to continue a healthy diet with adequate calories and protein to promote weight maintenance.  Patient educated to consume a controlled carbohydrate diet. I educated her to reduce concentrated sweets in her diet and provided suggestions for increased hydration.  Patient is following up with family practice physician for further recommendations for glycemic control.  Teach back method used.  Monitoring, evaluation, goals: Patient will continue to consume healthy diet to promote weight maintenance.  She will decrease concentrated sweets for improved glycemic control.  Next visit: Tuesday, September 9, during chemotherapy.

## 2013-07-16 ENCOUNTER — Telehealth: Payer: Self-pay | Admitting: Oncology

## 2013-07-16 ENCOUNTER — Ambulatory Visit: Payer: Medicare Other

## 2013-07-22 ENCOUNTER — Ambulatory Visit (HOSPITAL_BASED_OUTPATIENT_CLINIC_OR_DEPARTMENT_OTHER): Payer: Medicare Other

## 2013-07-22 ENCOUNTER — Other Ambulatory Visit (HOSPITAL_BASED_OUTPATIENT_CLINIC_OR_DEPARTMENT_OTHER): Payer: Medicare Other | Admitting: Lab

## 2013-07-22 ENCOUNTER — Telehealth: Payer: Self-pay | Admitting: Oncology

## 2013-07-22 ENCOUNTER — Telehealth: Payer: Self-pay | Admitting: *Deleted

## 2013-07-22 ENCOUNTER — Ambulatory Visit: Payer: Medicare Other | Admitting: Nutrition

## 2013-07-22 ENCOUNTER — Encounter: Payer: Self-pay | Admitting: Adult Health

## 2013-07-22 ENCOUNTER — Ambulatory Visit (HOSPITAL_BASED_OUTPATIENT_CLINIC_OR_DEPARTMENT_OTHER): Payer: Medicare Other | Admitting: Adult Health

## 2013-07-22 VITALS — BP 125/76 | HR 103 | Temp 98.0°F | Resp 20 | Ht 60.0 in | Wt 149.6 lb

## 2013-07-22 DIAGNOSIS — C541 Malignant neoplasm of endometrium: Secondary | ICD-10-CM

## 2013-07-22 DIAGNOSIS — R21 Rash and other nonspecific skin eruption: Secondary | ICD-10-CM

## 2013-07-22 DIAGNOSIS — C549 Malignant neoplasm of corpus uteri, unspecified: Secondary | ICD-10-CM

## 2013-07-22 DIAGNOSIS — Z5111 Encounter for antineoplastic chemotherapy: Secondary | ICD-10-CM

## 2013-07-22 LAB — COMPREHENSIVE METABOLIC PANEL (CC13)
ALT: 12 U/L (ref 0–55)
AST: 15 U/L (ref 5–34)
Albumin: 3.8 g/dL (ref 3.5–5.0)
BUN: 19.7 mg/dL (ref 7.0–26.0)
Calcium: 10.8 mg/dL — ABNORMAL HIGH (ref 8.4–10.4)
Glucose: 150 mg/dl — ABNORMAL HIGH (ref 70–140)
Sodium: 141 mEq/L (ref 136–145)

## 2013-07-22 LAB — CBC WITH DIFFERENTIAL/PLATELET
Basophils Absolute: 0 10*3/uL (ref 0.0–0.1)
EOS%: 0 % (ref 0.0–7.0)
HGB: 11.2 g/dL — ABNORMAL LOW (ref 11.6–15.9)
LYMPH%: 11.2 % — ABNORMAL LOW (ref 14.0–49.7)
MCH: 32.5 pg (ref 25.1–34.0)
MCV: 94.8 fL (ref 79.5–101.0)
MONO%: 2.2 % (ref 0.0–14.0)
RBC: 3.45 10*6/uL — ABNORMAL LOW (ref 3.70–5.45)
RDW: 19.1 % — ABNORMAL HIGH (ref 11.2–14.5)

## 2013-07-22 MED ORDER — DEXAMETHASONE 4 MG PO TABS: 8.0000 mg | ORAL_TABLET | Freq: Two times a day (BID) | ORAL | Status: DC

## 2013-07-22 MED ORDER — DIPHENHYDRAMINE HCL 50 MG/ML IJ SOLN
50.0000 mg | Freq: Once | INTRAMUSCULAR | Status: AC
  Administered 2013-07-22: 50 mg via INTRAVENOUS

## 2013-07-22 MED ORDER — SODIUM CHLORIDE 0.9 % IV SOLN
Freq: Once | INTRAVENOUS | Status: AC
  Administered 2013-07-22: 13:00:00 via INTRAVENOUS

## 2013-07-22 MED ORDER — PACLITAXEL CHEMO INJECTION 300 MG/50ML
175.0000 mg/m2 | Freq: Once | INTRAVENOUS | Status: AC
  Administered 2013-07-22: 306 mg via INTRAVENOUS
  Filled 2013-07-22: qty 51

## 2013-07-22 MED ORDER — OMEPRAZOLE 40 MG PO CPDR: 40.0000 mg | DELAYED_RELEASE_CAPSULE | Freq: Every day | ORAL | Status: DC

## 2013-07-22 MED ORDER — DIPHENHYDRAMINE HCL 50 MG/ML IJ SOLN
INTRAMUSCULAR | Status: AC
  Filled 2013-07-22: qty 1

## 2013-07-22 MED ORDER — HEPARIN SOD (PORK) LOCK FLUSH 100 UNIT/ML IV SOLN
500.0000 [IU] | Freq: Once | INTRAVENOUS | Status: AC | PRN
  Administered 2013-07-22: 500 [IU]
  Filled 2013-07-22: qty 5

## 2013-07-22 MED ORDER — DEXAMETHASONE SODIUM PHOSPHATE 20 MG/5ML IJ SOLN
20.0000 mg | Freq: Once | INTRAMUSCULAR | Status: AC
  Administered 2013-07-22: 20 mg via INTRAVENOUS

## 2013-07-22 MED ORDER — FAMOTIDINE IN NACL 20-0.9 MG/50ML-% IV SOLN
20.0000 mg | Freq: Once | INTRAVENOUS | Status: AC
  Administered 2013-07-22: 20 mg via INTRAVENOUS

## 2013-07-22 MED ORDER — ONDANSETRON 16 MG/50ML IVPB (CHCC)
INTRAVENOUS | Status: AC
  Filled 2013-07-22: qty 16

## 2013-07-22 MED ORDER — ONDANSETRON 16 MG/50ML IVPB (CHCC)
16.0000 mg | Freq: Once | INTRAVENOUS | Status: AC
  Administered 2013-07-22: 16 mg via INTRAVENOUS

## 2013-07-22 MED ORDER — FAMOTIDINE IN NACL 20-0.9 MG/50ML-% IV SOLN
INTRAVENOUS | Status: AC
  Administered 2013-07-22: 20 mg via INTRAVENOUS
  Filled 2013-07-22: qty 50

## 2013-07-22 MED ORDER — SODIUM CHLORIDE 0.9 % IV SOLN
525.6000 mg | Freq: Once | INTRAVENOUS | Status: AC
  Administered 2013-07-22: 530 mg via INTRAVENOUS
  Filled 2013-07-22: qty 53

## 2013-07-22 MED ORDER — SODIUM CHLORIDE 0.9 % IJ SOLN
10.0000 mL | INTRAMUSCULAR | Status: DC | PRN
  Administered 2013-07-22: 10 mL
  Filled 2013-07-22: qty 10

## 2013-07-22 MED ORDER — DEXAMETHASONE SODIUM PHOSPHATE 20 MG/5ML IJ SOLN
INTRAMUSCULAR | Status: AC
  Filled 2013-07-22: qty 5

## 2013-07-22 NOTE — Patient Instructions (Signed)
Omeprazole tablets (OTC) What is this medicine? OMEPRAZOLE (oh ME pray zol) prevents the production of acid in the stomach. It is used to treat the symptoms of heartburn. You can buy this medicine without a prescription. This product is not for long-term use, unless otherwise directed by your doctor or health care professional. This medicine may be used for other purposes; ask your health care provider or pharmacist if you have questions. What should I tell my health care provider before I take this medicine? They need to know if you have any of these conditions: -black or bloody stools -chest pain -difficulty swallowing -have had heartburn for over 3 months -have heartburn with dizziness, lightheadedness or sweating -liver disease -stomach pain -unexplained weight loss -vomiting with blood -wheezing -an unusual or allergic reaction to omeprazole, other medicines, foods, dyes, or preservatives -pregnant or trying to get pregnant -breast-feeding How should I use this medicine? Take this medicine by mouth. Follow the directions on the product label. If you are taking this medicine without a prescription, take one tablet every day. Do not use for longer than 14 days or repeat a course of treatment more often than every 4 months unless directed by a doctor or healthcare professional. Take your dose at regular intervals every 24 hours. Swallow the tablet whole with a drink of water. Do not crush, break or chew. This medicine works best if taken on an empty stomach 30 minutes before breakfast. If you are using this medicine with the prescription of your doctor or healthcare professional, follow the directions you were given. Do not take your medicine more often than directed. Talk to your pediatrician regarding the use of this medicine in children. Special care may be needed. Overdosage: If you think you have taken too much of this medicine contact a poison control center or emergency room at  once. NOTE: This medicine is only for you. Do not share this medicine with others. What if I miss a dose? If you miss a dose, take it as soon as you can. If it is almost time for your next dose, take only that dose. Do not take double or extra doses. What may interact with this medicine? Do not take this medicine with any of the following medications: -atazanavir -clopidogrel -nelfinavir This medicine may also interact with the following medications: -ampicillin -certain medicines for anxiety or sleep -certain medicines that treat or prevent blood clots like warfarin -cyclosporine -diazepam -digoxin -disulfiram -iron salts -phenytoin -prescription medicine for fungal or yeast infection like itraconazole, ketoconazole, voriconazole -saquinavir -tacrolimus This list may not describe all possible interactions. Give your health care provider a list of all the medicines, herbs, non-prescription drugs, or dietary supplements you use. Also tell them if you smoke, drink alcohol, or use illegal drugs. Some items may interact with your medicine. What should I watch for while using this medicine? It can take several days before your heartburn gets better. Check with your doctor or health care professional if your condition does not start to get better, or if it gets worse. Do not treat yourself for heartburn with this medicine for more than 14 days in a row. You should only use this medicine for a 2-week treatment period once every 4 months. If your symptoms return shortly after your therapy is complete, or within the 4 month time frame, call your doctor or health care professional. What side effects may I notice from receiving this medicine? Side effects that you should report to your doctor or health   care professional as soon as possible: -allergic reactions like skin rash, itching or hives, swelling of the face, lips, or tongue -bone, muscle or joint pain -breathing problems -chest pain or  chest tightness -dark yellow or brown urine -dizziness -fast, irregular heartbeat -feeling faint or lightheaded -fever or sore throat -muscle spasm -palpitations -redness, blistering, peeling or loosening of the skin, including inside the mouth -seizures -tremors -unusual bleeding or bruising -unusually weak or tired -yellowing of the eyes or skin Side effects that usually do not require medical attention (Report these to your doctor or health care professional if they continue or are bothersome.): -constipation -diarrhea -dry mouth -headache -nausea This list may not describe all possible side effects. Call your doctor for medical advice about side effects. You may report side effects to FDA at 1-800-FDA-1088. Where should I keep my medicine? Keep out of the reach of children. Store at room temperature between 20 and 25 degrees C (68 and 77 degrees F). Protect from light and moisture. Throw away any unused medicine after the expiration date. NOTE: This sheet is a summary. It may not cover all possible information. If you have questions about this medicine, talk to your doctor, pharmacist, or health care provider.  2012, [employer]. (08/24/2010 1:43:26 PM) 

## 2013-07-22 NOTE — Patient Instructions (Addendum)
[facility] Discharge Instructions for Patients Receiving Chemotherapy  Today you received the following chemotherapy agents Taxol/Carboplatin To help prevent nausea and vomiting after your treatment, we encourage you to take your nausea medication as prescribed.  If you develop nausea and vomiting that is not controlled by your nausea medication, call the clinic.   BELOW ARE SYMPTOMS THAT SHOULD BE REPORTED IMMEDIATELY:  *FEVER GREATER THAN 100.5 F  *CHILLS WITH OR WITHOUT FEVER  NAUSEA AND VOMITING THAT IS NOT CONTROLLED WITH YOUR NAUSEA MEDICATION  *UNUSUAL SHORTNESS OF BREATH  *UNUSUAL BRUISING OR BLEEDING  TENDERNESS IN MOUTH AND THROAT WITH OR WITHOUT PRESENCE OF ULCERS  *URINARY PROBLEMS  *BOWEL PROBLEMS  UNUSUAL RASH Items with * indicate a potential emergency and should be followed up as soon as possible.  Feel free to call the clinic you have any questions or concerns. The clinic phone number is [phone].    

## 2013-07-22 NOTE — Progress Notes (Signed)
Patient reports she has been "detoxing" this past week.  She has eliminated soft drinks and sweetened beverages from her diet.  She has been only consuming water and milk.  Reports HG A1c was 6.7 at physician's office.  Glucose was decreased to 150 today from 336 last week.  She has been focusing on changing to a healthier plant-based diet.  Her weight has decreased slightly to 149.6 pounds September 9 from 152.4 pounds September 2.  Patient has no concerns.  Nutrition diagnosis: Unintended weight loss continues.  Intervention: Patient was educated to continue healthy diet with adequate calories and protein to promote maintenance of lean body mass.  Patient provided with support for eliminating sweetened beverages.  Controlled carbohydrate diet was reinforced. Teach back method used.  Monitoring, evaluation, goals: Patient will tolerate a healthy, plant-based diet for weight maintenance or safe weight loss.  She will continue to avoid concentrated sweets.  Next visit: Tuesday, September 30, in the infusion room.

## 2013-07-22 NOTE — Telephone Encounter (Signed)
, °

## 2013-07-22 NOTE — Telephone Encounter (Signed)
Per staff message I have adjusted 9/30 appt

## 2013-07-22 NOTE — Progress Notes (Signed)
OFFICE PROGRESS NOTE  CC  [name],[name], MD [address] [place] [address] Dr. Emmaline Kluver Dr. 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 She had undergone a robotic assisted total laparoscopic hysterectomy bilateral salpingo-oophorectomy right pelvic lymph node biopsy on 12/19/2011 without any complications. 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 There was no involvement of other organs lymphovascular invasion was not identified. 3 lymph nodes were negative for metastatic disease. 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 Patient subsequently continued to be seen by gynecologic oncology. In October 2013 patient had a Pap smear that was normal limits there was noted to be some granulation tissue that was removed from the cough. There is no evidence of disease.  #2patient was seen by Dr. Zenda Alpers on 04/29/2013 she had vaginal exam performed that was notable for a nodule in the vagina concerning for recurrence. A rectal examination revealed a firm 8 cm mass also. Because of this she had a biopsy performed. She also had a CT scan of the abdomen and pelvis ordered to further evaluate the sigmoid mass. The pathology came back inconclusive. However the CT of the abdomen and pelvis showed a lobular mass at the vagina cough measuring 7.0 x 3.2 cm proximally 4 cm craniocaudal dimension. The mass compressed the distal aspect of the sigmoid colon just above the rectum there was no mass approximates the posterior wall of the bladder without clear invasion noted evidence of  obstruction of stool proximal to this mass. There was also noted to be a large necrotic lymph node measuring 2.8 cm within the sigmoid mesocolon centrally. There are also small bilateral external iliac lymph nodes. There were no aggressive osseous lesions.   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  #4 patient was begun on palliative chemotherapy starting on 06/03/2013 with Taxol carboplatinum. Total of 6 cycles of treatment as planned.   #5 pain control: She is being managed with fentanyl patches and oxycodone.  CURRENT THERAPY: Taxol/Carbo cycle 3 day 1  INTERVAL HISTORY: SHERI GATCHEL 67 y.o. female returns for followup visit.  Her treatment was held last week due to thrombocytopenia.  That has since resolved.  She denies fevers, chills, nausea, vomiting, constipation, diarrhea, numbness, or any further concerns.      MEDICAL HISTORY: Past Medical History  Diagnosis Date  . Diabetes mellitus   . PONV (postoperative nausea and vomiting)   . Cancer     endometrial  . Angina     stress test on chart from 3/12/ none since March 2012  . Chronic kidney disease     kidney stones  . Hypertension     / EKG 1/13 Epic    ALLERGIES:  has No Known Allergies.  MEDICATIONS:  Current Outpatient Prescriptions  Medication Sig Dispense Refill  . amLODipine (NORVASC) 2.5 MG tablet Take 2.5 mg by mouth every morning.       . cyanocobalamin 2000 MCG tablet Take 2,000 mcg by mouth 2 (two) times daily.      [name] dexamethasone (DECADRON) 4 MG tablet Take 2 tablets (8 mg total) by  mouth 2 (two) times daily with a meal. Take two times a day starting the day after chemotherapy for 3 days.  30 tablet  1  . diphenhydrAMINE (SOMINEX) 25 MG tablet Take 25 mg by mouth at bedtime as needed for sleep.      [name] doxycycline (VIBRA-TABS) 100 MG tablet Take 100 mg by mouth every other day.      . fentaNYL (DURAGESIC -  DOSED MCG/HR) 25 MCG/HR patch Place 1 patch (25 mcg total) onto the skin every 3 (three) days.  10 patch  0  . lidocaine-prilocaine (EMLA) cream Apply topically as needed.  30 g  6  . lisinopril-hydrochlorothiazide (PRINZIDE,ZESTORETIC) 20-25 MG per tablet Take 1 tablet by mouth daily after breakfast.       . loratadine (CLARITIN) 10 MG tablet Take 10 mg by mouth daily.      . metFORMIN (GLUCOPHAGE) 500 MG tablet Take 500 mg by mouth every morning.       . metoprolol tartrate (LOPRESSOR) 25 MG tablet Take 25 mg by mouth 2 (two) times daily.       . Multiple Vitamin (MULTIVITAMIN WITH MINERALS) TABS Take 1 tablet by mouth every morning. She takes Surveyor, quantity.      . naproxen sodium (ANAPROX) 220 MG tablet Take 220 mg by mouth daily as needed.      [name] LORazepam (ATIVAN) 0.5 MG tablet Take 1 tablet (0.5 mg total) by mouth every 6 (six) hours as needed (Nausea or vomiting).  30 tablet  0  . omeprazole (PRILOSEC) 40 MG capsule Take 1 capsule (40 mg total) by mouth daily.  30 capsule  6  . ondansetron (ZOFRAN) 8 MG tablet Take 1 tablet (8 mg total) by mouth 2 (two) times daily. Take two times a day starting the day after chemo for 3 days. Then take two times a day as needed for nausea or vomiting.  30 tablet  1  . Oxycodone HCl 10 MG TABS Take 1 tablet (10 mg total) by mouth every 4 (four) hours as needed (for moderate to severe pain).  80 tablet  0  . prochlorperazine (COMPAZINE) 10 MG tablet Take 1 tablet (10 mg total) by mouth every 6 (six) hours as needed (Nausea or vomiting).  30 tablet  1  . prochlorperazine (COMPAZINE) 25 MG suppository Place 1 suppository (25 mg total) rectally every 12 (twelve) hours as needed for nausea.  12 suppository  3   No current facility-administered medications for this visit.    SURGICAL HISTORY:  Past Surgical History  Procedure Laterality Date  . Hysteroscopy w/d&c  11/21/2011    Procedure: DILATATION AND CURETTAGE /HYSTEROSCOPY;  Surgeon: Miguel Aschoff;  Location:  [facility];  Service: Gynecology;  Laterality: N/A;  . Tubal ligation    . Breast mass removal  1984, 1992    L breast  . Lithotripsy  2000, 2002  . Back surgery      91 and 2001  . Dilation and curettage of uterus    . Node dissection  12/19/2011    Procedure: NODE DISSECTION;  Surgeon: Laurette Schimke, MD PHD;  Location: [facility];  Service: Gynecology;  Laterality: N/A;    REVIEW OF SYSTEMS:  Pertinent items are noted in HPI.   PHYSICAL EXAMINATION: Blood pressure 125/76, pulse 103, temperature 98 F (36.7 C), temperature source Oral, resp. rate 20, height 5' (1.524 m), weight 149 lb 9.6 oz (67.858 kg). Body mass index is 29.22 kg/(m^2). General: Patient is a well appearing female  in no acute distress HEENT: PERRLA, sclerae anicteric no conjunctival pallor, MMM Neck: supple, no palpable adenopathy Lungs: clear to auscultation bilaterally, no wheezes, rhonchi, or rales Cardiovascular: regular rate rhythm, S1, S2, no murmurs, rubs or gallops Abdomen: Soft, non-tender, non-distended, normoactive bowel sounds, no HSM Extremities: warm and well perfused, no clubbing, cyanosis, or edema Skin: No rashes or lesions Neuro: Non-focal ECOG PERFORMANCE STATUS: 0 - Asymptomatic  LABORATORY DATA: Lab Results  Component Value Date   WBC 7.1 07/22/2013   HGB 11.2* 07/22/2013   HCT 32.7* 07/22/2013   MCV 94.8 07/22/2013   PLT 325 07/22/2013      Chemistry      Component Value Date/Time   NA 143 07/15/2013 0907   NA 141 12/20/2011 0334   K 3.8 07/15/2013 0907   K 4.1 12/20/2011 0334   CL 104 04/29/2013 1229   CL 104 12/20/2011 0334   CO2 21* 07/15/2013 0907   CO2 27 12/20/2011 0334   BUN 18.1 07/15/2013 0907   BUN 10 12/20/2011 0334   CREATININE 1.1 07/15/2013 0907   CREATININE 0.80 12/20/2011 0334      Component Value Date/Time   CALCIUM 10.9* 07/15/2013 0907   CALCIUM 9.5 12/20/2011 0334   ALKPHOS 77 07/15/2013 0907   ALKPHOS 69 12/15/2011 1125   AST 15 07/15/2013 0907   AST 22 12/15/2011 1125   ALT 17 07/15/2013 0907   ALT  19 12/15/2011 1125   BILITOT 0.44 07/15/2013 0907   BILITOT 0.8 12/15/2011 1125    ADDITIONAL INFORMATION: The malignant cells are positive for estrogen receptor, progesterone receptor, cytokeratin 7 and focally positive for CDX-2. They are negative for cytokeratin 20. Given the clinical history as well as the histologic features, the findings are consistent with metastatic endometrioid adenocarcinoma. (JBK:kh 05-19-13) Pecola Leisure MD Pathologist, Electronic Signature ( Signed 05/19/2013) FINAL DIAGNOSIS Diagnosis Pelvis, biopsy, Central lower - METASTATIC CARCINOMA WITH ASSOCIATED TUMOR NECROSIS. Microscopic Comment Immunostains will be performed and an addendum report will follow. (HCL:kh 05-15-13) Abigail Miyamoto MD Pathologist, Electronic Signature (Case signed 05/15/2013) Specimen Gross and Clinical Information Specimen(s) Obtained: Pelvis, biopsy, Central lower Specimen Clinical Information Recurrent cancer (tl) Gross Received in formalin are fragments and cores of soft white tissue having an aggregate measurement of 1.3 x 0.5 x 0.1 cm. The specimen is submitted in toto. (GRP: ecj 05/14/2013) 1 of 2 Duplicate copy FINAL for Aloisi, Isabellarose P ([id number]) Stain(s) used in Diagnosis: The following stain(s) were used in diagnosing the case: PR - NOACIS, ER - NOACIS, CK-7, CDX-2, CK 20. The control(s) stained appropriately. Disc   RADIOGRAPHIC STUDIES:  Dg Chest 2 View  05/06/2013   *RADIOLOGY REPORT*  Clinical Data: Endometrial cancer  CHEST - 2 VIEW  Comparison: December 15, 2011.  Findings: Cardiomediastinal silhouette appears normal.  No acute pulmonary disease is noted.  Bony thorax is intact.  Stable small dense nodule seen in right lower lobe compared to prior exam most consistent with granuloma.  IMPRESSION: Stable small nodule seen in right lower lobe most consistent with granuloma.  No acute cardiopulmonary abnormality seen.   Original Report Authenticated By: Lupita Raider.,   M.D.   Ct Abdomen Pelvis W Contrast  05/05/2013   *RADIOLOGY REPORT*  Clinical Data: Pelvic pain.  History of endometrial carcinoma.  CT ABDOMEN AND PELVIS WITH CONTRAST  Technique:  Multidetector CT imaging of the abdomen and pelvis was performed following the standard protocol during bolus administration of intravenous contrast.  Contrast: OMNIPAQUE IOHEXOL 300 MG/ML  SOLN  Comparison: CT 01/05/2006  Findings: Lung bases are clear.  No pericardial fluid.  No focal hepatic lesion.  The gallbladder, pancreas, spleen, adrenal glands, kidneys are normal.  There is a cortical scarring of the right kidney.  No hydronephrosis.  The stomach, small bowel, appendix, cecum are normal.  The colon is normal to the level of the descending colon.  There is a lobular mass at the vaginal cuff measuring 7.0 x 3.2 cm in axial dimension and approximately 4 cm craniocaudad dimension. There is a mass compresses the distal aspect of the sigmoid colon just above the rectum (image 62, axial, image 57, coronal series). No mass approximates the posterior wall the bladder without clear invasion .There is no evidence of obstruction of stool proximal to this mass compressing the sigmoid colon.  There is a large necrotic lymph node measuring 2.8 cm within the sigmoid mesocolon centrally (60).  Patient status post hysterectomy and oophorectomy.  Small bilateral external iliac lymph nodes.  For example 8 mm node on the right and 5 mm node on the left (image 59).  There is no periaortic adenopathy.  No periportal or mesenteric adenopathy  Review of the bone windows demonstrates post lumbar  fusion.  No aggressive osseous lesions.  IMPRESSION:  1.  Lobular mass at the vaginal cuff is consistent with endometrial carcinoma progression.  2.  Mass extends to the serosal surface of the sigmoid colon and compresses the lumen of the sigmoid colon.  No obstruction at this time.  3.  Mass  approximates the posterior wall of the bladder without  clear invasion.  4. Large necrotic lymph node within the sigmoid mesocolon superior to the vaginal cuff.  5.  Small bilateral iliac lymph nodes.   Original Report Authenticated By: Genevive Bi, M.D.   Nm Pet Image Restag (ps) Skull Base To Thigh  05/09/2013   *RADIOLOGY REPORT*  Clinical Data: Subsequent treatment strategy for endometrial cancer. Hysterectomy 12/19/2011.  Recurrent pelvic masses on the CT.  NUCLEAR MEDICINE PET SKULL BASE TO THIGH  Fasting Blood Glucose:  141  Technique:  18.3 mCi F-18 FDG was injected intravenously. CT data was obtained and used for attenuation correction and anatomic localization only.  (This was not acquired as a diagnostic CT examination.) Additional exam technical data entered on technologist worksheet.  Comparison:  Abdominal pelvic CT 05/05/2013.  Findings:  Neck: No hypermetabolic lymph nodes in the neck.  Chest:  There are no hypermetabolic mediastinal or hilar lymph nodes.  There is no abnormal metabolic activity within the lungs. CT images demonstrate mild atherosclerosis.  There are no suspicious pulmonary findings.  Abdomen/Pelvis:  There is no abnormal metabolic activity within the liver, adrenal glands, spleen or pancreas.  No hypermetabolic lymph nodes are identified within the abdomen.  However, the large lobulated mass contiguous with the vaginal cuff is hypermetabolic. This demonstrates an SUV max of approximately 24.5.  The mass measures up to 6.8 x 3.6 cm transverse on image 186.  The necrotic nodal mass within the sigmoid the mesocolon is also hypermetabolic. This measures 3.1 cm on image 175 and demonstrates an SUV max of 17.1.  The right external iliac and pelvic sidewall lymph nodes demonstrated on the recent CT are also hypermetabolic.  The largest node measures 2.0 cm on image 176 and has an SUV max of 17.1.  No hypermetabolic left pelvic side wall nodes are identified.  There is no generalized peritoneal hypermetabolic activity or ascites.  Scattered activity in the colon is  likely physiologic.  The right kidney demonstrates lobularity and scarring.  Skeleton:  No focal hypermetabolic activity to suggest skeletal metastasis.  IMPRESSION:  1.  Multifocal recurrence within the pelvis involving the vaginal cuff, mesenteric and right pelvic sidewall lymph nodes. 2.  No distant metastases identified.   Original Report Authenticated By: Carey Bullocks, M.D.   Ct Biopsy  05/14/2013   *RADIOLOGY REPORT*  Clinical history:67 year old with history of endometrial cancer and suspicious lesions in the pelvis.  PROCEDURE(S): CT GUIDED BIOPSY OF PELVIC MASS  Physician: Rachelle Hora. Henn, MD  Medications:Versed 2 mg, Fentanyl 100 mcg.  A radiology nurse monitored the patient for moderate sedation.  Moderate sedation time:28 minutes  Procedure:The procedure was explained to the patient.  The risks and benefits of the procedure were discussed and the patient's questions were addressed.  Informed consent was obtained from the patient.  The patient was placed prone on the CT scanner.  Images of the pelvis were obtained.  The left gluteal area was prepped and draped in a sterile fashion.  Skin and soft tissues were anesthetized with lidocaine.  17 gauge needle was directed into the pelvic mass from a left transgluteal approach.  Needle position confirmed along the posterior aspect of the lesion.  Three core biopsies were obtained with an 18 gauge device.  Samples placed in formalin.  17 gauge needle was removed without complication.  Findings:There is a pelvic mass situated adjacent to the vaginal cuff.  The needle was positioned along the posterior aspect of the lesion.  Complications: None  Impression:CT guided core biopsies of the pelvic mass.   Original Report Authenticated By: Richarda Overlie, M.D.    ASSESSMENT: 67 year old female with  #1 recurrent endometrioid adenocarcinoma of the uterus. Patient is gong to receive palliative chemotherapy consisting of Taxol and  carboplatinum. I discussed this with the patient and her daughters. We discussed the rationale for this. We discussed risks benefits and potential side effects.  #2 patient underwent Port-A-Cath placement by interventional radiology and underwent chemotherapy teaching class.   #3 patient was begun on Taxol and carboplatinum starting on 06/03/2013. Today is cycle 3  #4 pain control patient is on fentanyl patches as well as oxycodone. Her pain is really well controlled.  #5 rash unclear etiology I have recommended she continue the Zyrtec/Claritin. She also continued to take Benadryl as needed postchemotherapy.  This is resolved as of, 07/01/13.  #6 patient is recommended to exercise and eat a healthy diet.  PLAN:  #1 Doing well.  Labs have recovered, patient will proceed with chemotherapy.   #2 I prescribed Omeprazole as she takes Aleve for three to five days after chemotherapy for prophylaxis of GI ulcers while she is taking Dexamethasone.    #3 She will return tomorrow for Neulasta and in one week for labs and appointment.    All questions were answered. The patient knows to call the clinic with any problems, questions or concerns. We can certainly see the patient much sooner if necessary.  I spent 25 minutes counseling the patient face to face. The total time spent in the appointment was 30 minutes.  Cherie Ouch Lyn Hollingshead, NP Medical Oncology [facility] Phone: [phone]

## 2013-07-23 ENCOUNTER — Ambulatory Visit (HOSPITAL_BASED_OUTPATIENT_CLINIC_OR_DEPARTMENT_OTHER): Payer: Medicare Other

## 2013-07-23 VITALS — BP 122/62 | HR 86 | Temp 98.1°F

## 2013-07-23 DIAGNOSIS — C549 Malignant neoplasm of corpus uteri, unspecified: Secondary | ICD-10-CM

## 2013-07-23 DIAGNOSIS — C541 Malignant neoplasm of endometrium: Secondary | ICD-10-CM

## 2013-07-23 MED ORDER — PEGFILGRASTIM INJECTION 6 MG/0.6ML
6.0000 mg | Freq: Once | SUBCUTANEOUS | Status: AC
  Administered 2013-07-23: 6 mg via SUBCUTANEOUS
  Filled 2013-07-23: qty 0.6

## 2013-07-29 ENCOUNTER — Ambulatory Visit (HOSPITAL_BASED_OUTPATIENT_CLINIC_OR_DEPARTMENT_OTHER): Payer: Medicare Other | Admitting: Oncology

## 2013-07-29 ENCOUNTER — Ambulatory Visit (HOSPITAL_BASED_OUTPATIENT_CLINIC_OR_DEPARTMENT_OTHER): Payer: Medicare Other

## 2013-07-29 ENCOUNTER — Telehealth: Payer: Self-pay | Admitting: *Deleted

## 2013-07-29 ENCOUNTER — Other Ambulatory Visit (HOSPITAL_BASED_OUTPATIENT_CLINIC_OR_DEPARTMENT_OTHER): Payer: Medicare Other | Admitting: *Deleted

## 2013-07-29 ENCOUNTER — Other Ambulatory Visit (HOSPITAL_BASED_OUTPATIENT_CLINIC_OR_DEPARTMENT_OTHER): Payer: Medicare Other | Admitting: Lab

## 2013-07-29 VITALS — BP 103/67 | HR 91 | Temp 97.7°F | Resp 18 | Wt 147.0 lb

## 2013-07-29 DIAGNOSIS — C549 Malignant neoplasm of corpus uteri, unspecified: Secondary | ICD-10-CM

## 2013-07-29 DIAGNOSIS — E860 Dehydration: Secondary | ICD-10-CM

## 2013-07-29 DIAGNOSIS — C541 Malignant neoplasm of endometrium: Secondary | ICD-10-CM

## 2013-07-29 LAB — COMPREHENSIVE METABOLIC PANEL (CC13)
Albumin: 3.6 g/dL (ref 3.5–5.0)
Alkaline Phosphatase: 84 U/L (ref 40–150)
BUN: 55.8 mg/dL — ABNORMAL HIGH (ref 7.0–26.0)
Calcium: 10.6 mg/dL — ABNORMAL HIGH (ref 8.4–10.4)
Chloride: 103 mEq/L (ref 98–109)
Glucose: 130 mg/dl (ref 70–140)
Potassium: 4.1 mEq/L (ref 3.5–5.1)

## 2013-07-29 LAB — CBC WITH DIFFERENTIAL/PLATELET
Basophils Absolute: 0 10*3/uL (ref 0.0–0.1)
Eosinophils Absolute: 0.1 10*3/uL (ref 0.0–0.5)
HGB: 11.1 g/dL — ABNORMAL LOW (ref 11.6–15.9)
MCV: 95.8 fL (ref 79.5–101.0)
MONO%: 13.2 % (ref 0.0–14.0)
NEUT#: 2.8 10*3/uL (ref 1.5–6.5)
RDW: 20.8 % — ABNORMAL HIGH (ref 11.2–14.5)

## 2013-07-29 MED ORDER — SODIUM CHLORIDE 0.9 % IJ SOLN
10.0000 mL | Freq: Once | INTRAMUSCULAR | Status: AC
  Administered 2013-07-29: 10 mL via INTRAVENOUS
  Filled 2013-07-29: qty 10

## 2013-07-29 MED ORDER — HEPARIN SOD (PORK) LOCK FLUSH 100 UNIT/ML IV SOLN
500.0000 [IU] | Freq: Once | INTRAVENOUS | Status: AC
  Administered 2013-07-29: 500 [IU] via INTRAVENOUS
  Filled 2013-07-29: qty 5

## 2013-07-29 MED ORDER — SODIUM CHLORIDE 0.9 % IV SOLN
Freq: Once | INTRAVENOUS | Status: AC
  Administered 2013-07-29: 16:00:00 via INTRAVENOUS

## 2013-07-29 NOTE — Patient Instructions (Addendum)
Dehydration, Adult Dehydration is when you lose more fluids from the body than you take in. Vital organs like the kidneys, brain, and heart cannot function without a proper amount of fluids and salt. Any loss of fluids from the body can cause dehydration.  CAUSES   Vomiting.  Diarrhea.  Excessive sweating.  Excessive urine output.  Fever. SYMPTOMS  Mild dehydration  Thirst.  Dry lips.  Slightly dry mouth. Moderate dehydration  Very dry mouth.  Sunken eyes.  Skin does not bounce back quickly when lightly pinched and released.  Dark urine and decreased urine production.  Decreased tear production.  Headache. Severe dehydration  Very dry mouth.  Extreme thirst.  Rapid, weak pulse (more than 100 beats per minute at rest).  Cold hands and feet.  Not able to sweat in spite of heat and temperature.  Rapid breathing.  Blue lips.  Confusion and lethargy.  Difficulty being awakened.  Minimal urine production.  No tears. DIAGNOSIS  Your caregiver will diagnose dehydration based on your symptoms and your exam. Blood and urine tests will help confirm the diagnosis. The diagnostic evaluation should also identify the cause of dehydration. TREATMENT  Treatment of mild or moderate dehydration can often be done at home by increasing the amount of fluids that you drink. It is best to drink small amounts of fluid more often. Drinking too much at one time can make vomiting worse. Refer to the home care instructions below. Severe dehydration needs to be treated at the hospital where you will probably be given intravenous (IV) fluids that contain water and electrolytes. HOME CARE INSTRUCTIONS   Ask your caregiver about specific rehydration instructions.  Drink enough fluids to keep your urine clear or pale yellow.  Drink small amounts frequently if you have nausea and vomiting.  Eat as you normally do.  Avoid:  Foods or drinks high in sugar.  Carbonated  drinks.  Juice.  Extremely hot or cold fluids.  Drinks with caffeine.  Fatty, greasy foods.  Alcohol.  Tobacco.  Overeating.  Gelatin desserts.  Wash your hands well to avoid spreading bacteria and viruses.  Only take over-the-counter or prescription medicines for pain, discomfort, or fever as directed by your caregiver.  Ask your caregiver if you should continue all prescribed and over-the-counter medicines.  Keep all follow-up appointments with your caregiver. SEEK MEDICAL CARE IF:  You have abdominal pain and it increases or stays in one area (localizes).  You have a rash, stiff neck, or severe headache.  You are irritable, sleepy, or difficult to awaken.  You are weak, dizzy, or extremely thirsty. SEEK IMMEDIATE MEDICAL CARE IF:   You are unable to keep fluids down or you get worse despite treatment.  You have frequent episodes of vomiting or diarrhea.  You have blood or green matter (bile) in your vomit.  You have blood in your stool or your stool looks black and tarry.  You have not urinated in 6 to 8 hours, or you have only urinated a small amount of very dark urine.  You have a fever.  You faint. MAKE SURE YOU:   Understand these instructions.  Will watch your condition.  Will get help right away if you are not doing well or get worse. Document Released: 10/30/2005 Document Revised: 01/22/2012 Document Reviewed: 06/19/2011 [employer] Patient Information 2014 [employer].  

## 2013-07-29 NOTE — Progress Notes (Signed)
Discharged at 34 with daughter.  Ambulatory in no distress.

## 2013-07-29 NOTE — Telephone Encounter (Signed)
appts made and printed. Pt is aware that cs will call with CT appts..MW stated that charge nurse is aware of pt iv fluids for today....td

## 2013-07-29 NOTE — Patient Instructions (Addendum)
Proceed with IVF due to dehydration  Restaging CT scans  We will see you back in 2 weeks' time

## 2013-08-03 NOTE — Progress Notes (Signed)
OFFICE PROGRESS NOTE  CC  [name],[name], MD [address], [place] [address] [place] [address] Dr. Emmaline Kluver Dr. 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 She had undergone a robotic assisted total laparoscopic hysterectomy bilateral salpingo-oophorectomy right pelvic lymph node biopsy on 12/19/2011 without any complications. 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 There was no involvement of other organs lymphovascular invasion was not identified. 3 lymph nodes were negative for metastatic disease. 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 Patient subsequently continued to be seen by gynecologic oncology. In October 2013 patient had a Pap smear that was normal limits there was noted to be some granulation tissue that was removed from the cough. There is no evidence of disease.  #2patient was seen by Dr. Zenda Alpers on 04/29/2013 she had vaginal exam performed that was notable for a nodule in the vagina concerning for recurrence. A rectal examination revealed a firm 8 cm mass also. Because of this she had a biopsy performed. She also had a CT scan of the abdomen and pelvis ordered to further evaluate the sigmoid mass. The pathology came back inconclusive. However the CT of the abdomen and pelvis showed a lobular mass at the vagina cough measuring 7.0 x 3.2 cm proximally 4 cm craniocaudal dimension. The mass compressed the distal aspect of the sigmoid colon just above the rectum there was no mass approximates the posterior wall of the bladder without clear invasion noted evidence of  obstruction of stool proximal to this mass. There was also noted to be a large necrotic lymph node measuring 2.8 cm within the sigmoid mesocolon centrally. There are also small bilateral external iliac lymph nodes. There were no aggressive osseous lesions.   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  #4 patient was begun on palliative chemotherapy starting on 06/03/2013 with Taxol carboplatinum. Total of 6 cycles of treatment as planned.   #5 pain control: She is being managed with fentanyl patches and oxycodone.  CURRENT THERAPY: Taxol/Carbo cycle 3 day 8  INTERVAL HISTORY: PINKIE MANGER 66 y.o. female returns for followup visit.  H  She denies fevers, chills, nausea, vomiting, constipation, diarrhea, numbness, or any further concerns.    Remainder of the review of systems is negative  MEDICAL HISTORY: Past Medical History  Diagnosis Date  . Diabetes mellitus   . PONV (postoperative nausea and vomiting)   . Cancer     endometrial  . Angina     stress test on chart from 3/12/ none since March 2012  . Chronic kidney disease     kidney stones  . Hypertension     / EKG 1/13 Epic    ALLERGIES:  has No Known Allergies.  MEDICATIONS:  Current Outpatient Prescriptions  Medication Sig Dispense Refill  . amLODipine (NORVASC) 2.5 MG tablet Take 2.5 mg by mouth every morning.       . cyanocobalamin 2000 MCG tablet Take 2,000 mcg by mouth 2 (two) times daily.      [name] dexamethasone (DECADRON) 4 MG tablet Take 2 tablets (8 mg total) by mouth 2 (two) times daily with  a meal. Take two times a day starting the day after chemotherapy for 3 days.  30 tablet  1  . doxycycline (VIBRA-TABS) 100 MG tablet Take 100 mg by mouth every other day.      . fentaNYL (DURAGESIC - DOSED MCG/HR) 25 MCG/HR patch Place 1 patch (25 mcg total) onto the skin every 3 (three) days.  10 patch  0  . lidocaine-prilocaine  (EMLA) cream Apply topically as needed.  30 g  6  . lisinopril-hydrochlorothiazide (PRINZIDE,ZESTORETIC) 20-25 MG per tablet Take 1 tablet by mouth daily after breakfast.       . loratadine (CLARITIN) 10 MG tablet Take 10 mg by mouth daily.      . metFORMIN (GLUCOPHAGE) 500 MG tablet Take 500 mg by mouth 2 (two) times daily.       . metoprolol tartrate (LOPRESSOR) 25 MG tablet Take 25 mg by mouth 2 (two) times daily.       . Multiple Vitamin (MULTIVITAMIN WITH MINERALS) TABS Take 1 tablet by mouth every morning. She takes Surveyor, quantity.      . diphenhydrAMINE (SOMINEX) 25 MG tablet Take 25 mg by mouth at bedtime as needed for sleep.      [name] LORazepam (ATIVAN) 0.5 MG tablet Take 1 tablet (0.5 mg total) by mouth every 6 (six) hours as needed (Nausea or vomiting).  30 tablet  0  . naproxen sodium (ANAPROX) 220 MG tablet Take 220 mg by mouth daily as needed.      [name] omeprazole (PRILOSEC) 40 MG capsule Take 1 capsule (40 mg total) by mouth daily.  30 capsule  6  . ondansetron (ZOFRAN) 8 MG tablet Take 1 tablet (8 mg total) by mouth 2 (two) times daily. Take two times a day starting the day after chemo for 3 days. Then take two times a day as needed for nausea or vomiting.  30 tablet  1  . Oxycodone HCl 10 MG TABS Take 1 tablet (10 mg total) by mouth every 4 (four) hours as needed (for moderate to severe pain).  80 tablet  0  . prochlorperazine (COMPAZINE) 10 MG tablet Take 1 tablet (10 mg total) by mouth every 6 (six) hours as needed (Nausea or vomiting).  30 tablet  1  . prochlorperazine (COMPAZINE) 25 MG suppository Place 1 suppository (25 mg total) rectally every 12 (twelve) hours as needed for nausea.  12 suppository  3   No current facility-administered medications for this visit.    SURGICAL HISTORY:  Past Surgical History  Procedure Laterality Date  . Hysteroscopy w/d&c  11/21/2011    Procedure: DILATATION AND CURETTAGE /HYSTEROSCOPY;  Surgeon: Miguel Aschoff;  Location: [facility];  Service:  Gynecology;  Laterality: N/A;  . Tubal ligation    . Breast mass removal  1984, 1992    L breast  . Lithotripsy  2000, 2002  . Back surgery      91 and 2001  . Dilation and curettage of uterus    . Node dissection  12/19/2011    Procedure: NODE DISSECTION;  Surgeon: Laurette Schimke, MD PHD;  Location: [facility];  Service: Gynecology;  Laterality: N/A;    REVIEW OF SYSTEMS:  Pertinent items are noted in HPI.   PHYSICAL EXAMINATION: Blood pressure 103/67, pulse 91, temperature 97.7 F (36.5 C), temperature source Oral, resp. rate 18, weight 147 lb (66.679 kg). Body mass index is 28.71 kg/(m^2). General: Patient is a well appearing female in no acute distress HEENT: PERRLA, sclerae anicteric no conjunctival  pallor, MMM Neck: supple, no palpable adenopathy Lungs: clear to auscultation bilaterally, no wheezes, rhonchi, or rales Cardiovascular: regular rate rhythm, S1, S2, no murmurs, rubs or gallops Abdomen: Soft, non-tender, non-distended, normoactive bowel sounds, no HSM Extremities: warm and well perfused, no clubbing, cyanosis, or edema Skin: No rashes or lesions Neuro: Non-focal ECOG PERFORMANCE STATUS: 0 - Asymptomatic  LABORATORY DATA: Lab Results  Component Value Date   WBC 7.0 07/29/2013   HGB 11.1* 07/29/2013   HCT 32.6* 07/29/2013   MCV 95.8 07/29/2013   PLT 176 07/29/2013      Chemistry      Component Value Date/Time   NA 139 07/29/2013 1426   NA 141 12/20/2011 0334   K 4.1 07/29/2013 1426   K 4.1 12/20/2011 0334   CL 104 04/29/2013 1229   CL 104 12/20/2011 0334   CO2 27 07/29/2013 1426   CO2 27 12/20/2011 0334   BUN 55.8* 07/29/2013 1426   BUN 10 12/20/2011 0334   CREATININE 1.6* 07/29/2013 1426   CREATININE 0.80 12/20/2011 0334      Component Value Date/Time   CALCIUM 10.6* 07/29/2013 1426   CALCIUM 9.5 12/20/2011 0334   ALKPHOS 84 07/29/2013 1426   ALKPHOS 69 12/15/2011 1125   AST 13 07/29/2013 1426   AST 22 12/15/2011 1125   ALT 15 07/29/2013 1426   ALT 19 12/15/2011 1125   BILITOT  0.57 07/29/2013 1426   BILITOT 0.8 12/15/2011 1125    ADDITIONAL INFORMATION: The malignant cells are positive for estrogen receptor, progesterone receptor, cytokeratin 7 and focally positive for CDX-2. They are negative for cytokeratin 20. Given the clinical history as well as the histologic features, the findings are consistent with metastatic endometrioid adenocarcinoma. (JBK:kh 05-19-13) Pecola Leisure MD Pathologist, Electronic Signature ( Signed 05/19/2013) FINAL DIAGNOSIS Diagnosis Pelvis, biopsy, Central lower - METASTATIC CARCINOMA WITH ASSOCIATED TUMOR NECROSIS. Microscopic Comment Immunostains will be performed and an addendum report will follow. (HCL:kh 05-15-13) Abigail Miyamoto MD Pathologist, Electronic Signature (Case signed 05/15/2013) Specimen Gross and Clinical Information Specimen(s) Obtained: Pelvis, biopsy, Central lower Specimen Clinical Information Recurrent cancer (tl) Gross Received in formalin are fragments and cores of soft white tissue having an aggregate measurement of 1.3 x 0.5 x 0.1 cm. The specimen is submitted in toto. (GRP: ecj 05/14/2013) 1 of 2 Duplicate copy FINAL for Inclan, Atiyah P ([id number]) Stain(s) used in Diagnosis: The following stain(s) were used in diagnosing the case: PR - NOACIS, ER - NOACIS, CK-7, CDX-2, CK 20. The control(s) stained appropriately. Disc   RADIOGRAPHIC STUDIES:  Dg Chest 2 View  05/06/2013   *RADIOLOGY REPORT*  Clinical Data: Endometrial cancer  CHEST - 2 VIEW  Comparison: December 15, 2011.  Findings: Cardiomediastinal silhouette appears normal.  No acute pulmonary disease is noted.  Bony thorax is intact.  Stable small dense nodule seen in right lower lobe compared to prior exam most consistent with granuloma.  IMPRESSION: Stable small nodule seen in right lower lobe most consistent with granuloma.  No acute cardiopulmonary abnormality seen.   Original Report Authenticated By: Lupita Raider.,  M.D.   Ct Abdomen Pelvis W  Contrast  05/05/2013   *RADIOLOGY REPORT*  Clinical Data: Pelvic pain.  History of endometrial carcinoma.  CT ABDOMEN AND PELVIS WITH CONTRAST  Technique:  Multidetector CT imaging of the abdomen and pelvis was performed following the standard protocol during bolus administration of intravenous contrast.  Contrast: OMNIPAQUE IOHEXOL 300 MG/ML  SOLN  Comparison: CT 01/05/2006  Findings: Lung bases  are clear.  No pericardial fluid.  No focal hepatic lesion.  The gallbladder, pancreas, spleen, adrenal glands, kidneys are normal.  There is a cortical scarring of the right kidney.  No hydronephrosis.  The stomach, small bowel, appendix, cecum are normal.  The colon is normal to the level of the descending colon.  There is a lobular mass at the vaginal cuff measuring 7.0 x 3.2 cm in axial dimension and approximately 4 cm craniocaudad dimension. There is a mass compresses the distal aspect of the sigmoid colon just above the rectum (image 62, axial, image 57, coronal series). No mass approximates the posterior wall the bladder without clear invasion .There is no evidence of obstruction of stool proximal to this mass compressing the sigmoid colon.  There is a large necrotic lymph node measuring 2.8 cm within the sigmoid mesocolon centrally (60).  Patient status post hysterectomy and oophorectomy.  Small bilateral external iliac lymph nodes.  For example 8 mm node on the right and 5 mm node on the left (image 59).  There is no periaortic adenopathy.  No periportal or mesenteric adenopathy  Review of the bone windows demonstrates post lumbar  fusion.  No aggressive osseous lesions.  IMPRESSION:  1.  Lobular mass at the vaginal cuff is consistent with endometrial carcinoma progression.  2.  Mass extends to the serosal surface of the sigmoid colon and compresses the lumen of the sigmoid colon.  No obstruction at this time.  3.  Mass  approximates the posterior wall of the bladder without clear invasion.  4. Large  necrotic lymph node within the sigmoid mesocolon superior to the vaginal cuff.  5.  Small bilateral iliac lymph nodes.   Original Report Authenticated By: Genevive Bi, M.D.   Nm Pet Image Restag (ps) Skull Base To Thigh  05/09/2013   *RADIOLOGY REPORT*  Clinical Data: Subsequent treatment strategy for endometrial cancer. Hysterectomy 12/19/2011.  Recurrent pelvic masses on the CT.  NUCLEAR MEDICINE PET SKULL BASE TO THIGH  Fasting Blood Glucose:  141  Technique:  18.3 mCi F-18 FDG was injected intravenously. CT data was obtained and used for attenuation correction and anatomic localization only.  (This was not acquired as a diagnostic CT examination.) Additional exam technical data entered on technologist worksheet.  Comparison:  Abdominal pelvic CT 05/05/2013.  Findings:  Neck: No hypermetabolic lymph nodes in the neck.  Chest:  There are no hypermetabolic mediastinal or hilar lymph nodes.  There is no abnormal metabolic activity within the lungs. CT images demonstrate mild atherosclerosis.  There are no suspicious pulmonary findings.  Abdomen/Pelvis:  There is no abnormal metabolic activity within the liver, adrenal glands, spleen or pancreas.  No hypermetabolic lymph nodes are identified within the abdomen.  However, the large lobulated mass contiguous with the vaginal cuff is hypermetabolic. This demonstrates an SUV max of approximately 24.5.  The mass measures up to 6.8 x 3.6 cm transverse on image 186.  The necrotic nodal mass within the sigmoid the mesocolon is also hypermetabolic. This measures 3.1 cm on image 175 and demonstrates an SUV max of 17.1.  The right external iliac and pelvic sidewall lymph nodes demonstrated on the recent CT are also hypermetabolic.  The largest node measures 2.0 cm on image 176 and has an SUV max of 17.1.  No hypermetabolic left pelvic side wall nodes are identified.  There is no generalized peritoneal hypermetabolic activity or ascites. Scattered activity in the colon is  likely physiologic.  The right kidney demonstrates lobularity and  scarring.  Skeleton:  No focal hypermetabolic activity to suggest skeletal metastasis.  IMPRESSION:  1.  Multifocal recurrence within the pelvis involving the vaginal cuff, mesenteric and right pelvic sidewall lymph nodes. 2.  No distant metastases identified.   Original Report Authenticated By: Carey Bullocks, M.D.   Ct Biopsy  05/14/2013   *RADIOLOGY REPORT*  Clinical history:67 year old with history of endometrial cancer and suspicious lesions in the pelvis.  PROCEDURE(S): CT GUIDED BIOPSY OF PELVIC MASS  Physician: Rachelle Hora. Henn, MD  Medications:Versed 2 mg, Fentanyl 100 mcg.  A radiology nurse monitored the patient for moderate sedation.  Moderate sedation time:28 minutes  Procedure:The procedure was explained to the patient.  The risks and benefits of the procedure were discussed and the patient's questions were addressed.  Informed consent was obtained from the patient.  The patient was placed prone on the CT scanner.  Images of the pelvis were obtained.  The left gluteal area was prepped and draped in a sterile fashion.  Skin and soft tissues were anesthetized with lidocaine.  17 gauge needle was directed into the pelvic mass from a left transgluteal approach.  Needle position confirmed along the posterior aspect of the lesion.  Three core biopsies were obtained with an 18 gauge device.  Samples placed in formalin.  17 gauge needle was removed without complication.  Findings:There is a pelvic mass situated adjacent to the vaginal cuff.  The needle was positioned along the posterior aspect of the lesion.  Complications: None  Impression:CT guided core biopsies of the pelvic mass.   Original Report Authenticated By: Richarda Overlie, M.D.    ASSESSMENT: 67 year old female with  #1 recurrent endometrioid adenocarcinoma of the uterus. Patient is gong to receive palliative chemotherapy consisting of Taxol and carboplatinum. I discussed this with the  patient and her daughters. We discussed the rationale for this. We discussed risks benefits and potential side effects.  #2 patient underwent Port-A-Cath placement by interventional radiology and underwent chemotherapy teaching class.   #3 patient was begun on Taxol and carboplatinum starting on 06/03/2013. Today is cycle 3  #4 pain control patient is on fentanyl patches as well as oxycodone. Her pain is really well controlled.  #5 rash unclear etiology I have recommended she continue the Zyrtec/Claritin. She also continued to take Benadryl as needed postchemotherapy.  This is resolved as of, 07/01/13.  #6 patient is recommended to exercise and eat a healthy diet.  PLAN:  #1 Doing well. Overall she is tolerating her chemotherapy well.  #2 we will set her up for restaging scans prior to her next chemotherapy.  #3 she will be seen back on 08/12/2013 for cycle 4 of treatment.  All questions were answered. The patient knows to call the clinic with any problems, questions or concerns. We can certainly see the patient much sooner if necessary.  I spent 25 minutes counseling the patient face to face. The total time spent in the appointment was 30 minutes.  Drue Second, MD Medical/Oncology [facility] [phone] (beeper) [phone] (Office)

## 2013-08-05 ENCOUNTER — Other Ambulatory Visit: Payer: Medicare Other | Admitting: Lab

## 2013-08-06 ENCOUNTER — Ambulatory Visit: Payer: Medicare Other

## 2013-08-06 ENCOUNTER — Ambulatory Visit (HOSPITAL_COMMUNITY)
Admission: RE | Admit: 2013-08-06 | Discharge: 2013-08-06 | Disposition: A | Discharge status: Home / Self Care | Payer: Medicare Other | Source: Ambulatory Visit | Attending: Oncology | Admitting: Oncology

## 2013-08-06 ENCOUNTER — Encounter (HOSPITAL_COMMUNITY): Payer: Self-pay

## 2013-08-06 DIAGNOSIS — Z79899 Other long term (current) drug therapy: Secondary | ICD-10-CM | POA: Insufficient documentation

## 2013-08-06 DIAGNOSIS — C775 Secondary and unspecified malignant neoplasm of intrapelvic lymph nodes: Secondary | ICD-10-CM | POA: Insufficient documentation

## 2013-08-06 DIAGNOSIS — C549 Malignant neoplasm of corpus uteri, unspecified: Secondary | ICD-10-CM | POA: Insufficient documentation

## 2013-08-06 DIAGNOSIS — R928 Other abnormal and inconclusive findings on diagnostic imaging of breast: Secondary | ICD-10-CM | POA: Insufficient documentation

## 2013-08-06 DIAGNOSIS — N281 Cyst of kidney, acquired: Secondary | ICD-10-CM | POA: Insufficient documentation

## 2013-08-06 DIAGNOSIS — C541 Malignant neoplasm of endometrium: Secondary | ICD-10-CM

## 2013-08-06 MED ORDER — IOHEXOL 300 MG/ML  SOLN
80.0000 mL | Freq: Once | INTRAMUSCULAR | Status: AC | PRN
  Administered 2013-08-06: 80 mL via INTRAVENOUS

## 2013-08-08 ENCOUNTER — Telehealth: Payer: Self-pay | Admitting: Gynecologic Oncology

## 2013-08-08 NOTE — Telephone Encounter (Signed)
Patient called requesting CT scan results.  Informed of results.  No concerns voiced.  Instructed to call for any questions or concerns.

## 2013-08-12 ENCOUNTER — Other Ambulatory Visit: Payer: Medicare Other | Admitting: Lab

## 2013-08-12 ENCOUNTER — Ambulatory Visit: Payer: Medicare Other | Admitting: Nutrition

## 2013-08-12 ENCOUNTER — Telehealth: Payer: Self-pay | Admitting: Oncology

## 2013-08-12 ENCOUNTER — Ambulatory Visit (HOSPITAL_BASED_OUTPATIENT_CLINIC_OR_DEPARTMENT_OTHER): Payer: Medicare Other

## 2013-08-12 ENCOUNTER — Telehealth: Payer: Self-pay | Admitting: *Deleted

## 2013-08-12 ENCOUNTER — Ambulatory Visit (HOSPITAL_BASED_OUTPATIENT_CLINIC_OR_DEPARTMENT_OTHER): Payer: Medicare Other | Admitting: Adult Health

## 2013-08-12 VITALS — BP 136/72 | HR 108 | Temp 98.2°F | Resp 18 | Ht 60.0 in | Wt 151.4 lb

## 2013-08-12 DIAGNOSIS — C541 Malignant neoplasm of endometrium: Secondary | ICD-10-CM

## 2013-08-12 DIAGNOSIS — Z5111 Encounter for antineoplastic chemotherapy: Secondary | ICD-10-CM

## 2013-08-12 DIAGNOSIS — C549 Malignant neoplasm of corpus uteri, unspecified: Secondary | ICD-10-CM

## 2013-08-12 DIAGNOSIS — E119 Type 2 diabetes mellitus without complications: Secondary | ICD-10-CM

## 2013-08-12 DIAGNOSIS — C7982 Secondary malignant neoplasm of genital organs: Secondary | ICD-10-CM

## 2013-08-12 LAB — COMPREHENSIVE METABOLIC PANEL (CC13)
AST: 14 U/L (ref 5–34)
Albumin: 3.6 g/dL (ref 3.5–5.0)
BUN: 18.4 mg/dL (ref 7.0–26.0)
CO2: 22 mEq/L (ref 22–29)
Calcium: 11 mg/dL — ABNORMAL HIGH (ref 8.4–10.4)
Chloride: 104 mEq/L (ref 98–109)
Creatinine: 1 mg/dL (ref 0.6–1.1)
Glucose: 294 mg/dl — ABNORMAL HIGH (ref 70–140)
Potassium: 3.8 mEq/L (ref 3.5–5.1)
Total Protein: 6.9 g/dL (ref 6.4–8.3)

## 2013-08-12 LAB — CBC WITH DIFFERENTIAL/PLATELET
Basophils Absolute: 0 10*3/uL (ref 0.0–0.1)
EOS%: 0 % (ref 0.0–7.0)
HCT: 30.6 % — ABNORMAL LOW (ref 34.8–46.6)
HGB: 10.3 g/dL — ABNORMAL LOW (ref 11.6–15.9)
MCH: 32.8 pg (ref 25.1–34.0)
MONO#: 0 10*3/uL — ABNORMAL LOW (ref 0.1–0.9)
NEUT#: 7.8 10*3/uL — ABNORMAL HIGH (ref 1.5–6.5)
RBC: 3.14 10*6/uL — ABNORMAL LOW (ref 3.70–5.45)
RDW: 19.1 % — ABNORMAL HIGH (ref 11.2–14.5)
WBC: 8.5 10*3/uL (ref 3.9–10.3)
lymph#: 0.6 10*3/uL — ABNORMAL LOW (ref 0.9–3.3)

## 2013-08-12 MED ORDER — ONDANSETRON 16 MG/50ML IVPB (CHCC)
INTRAVENOUS | Status: AC
  Filled 2013-08-12: qty 16

## 2013-08-12 MED ORDER — ONDANSETRON 16 MG/50ML IVPB (CHCC)
16.0000 mg | Freq: Once | INTRAVENOUS | Status: AC
  Administered 2013-08-12: 16 mg via INTRAVENOUS

## 2013-08-12 MED ORDER — FAMOTIDINE IN NACL 20-0.9 MG/50ML-% IV SOLN
20.0000 mg | Freq: Once | INTRAVENOUS | Status: AC
  Administered 2013-08-12: 20 mg via INTRAVENOUS

## 2013-08-12 MED ORDER — DIPHENHYDRAMINE HCL 50 MG/ML IJ SOLN
50.0000 mg | Freq: Once | INTRAMUSCULAR | Status: AC
  Administered 2013-08-12: 50 mg via INTRAVENOUS

## 2013-08-12 MED ORDER — SODIUM CHLORIDE 0.9 % IJ SOLN
10.0000 mL | INTRAMUSCULAR | Status: DC | PRN
Start: 2013-08-12 — End: 2013-08-12
  Administered 2013-08-12: 10 mL
  Filled 2013-08-12: qty 10

## 2013-08-12 MED ORDER — PACLITAXEL CHEMO INJECTION 300 MG/50ML
175.0000 mg/m2 | Freq: Once | INTRAVENOUS | Status: AC
  Administered 2013-08-12: 306 mg via INTRAVENOUS
  Filled 2013-08-12: qty 51

## 2013-08-12 MED ORDER — DEXAMETHASONE SODIUM PHOSPHATE 20 MG/5ML IJ SOLN
20.0000 mg | Freq: Once | INTRAMUSCULAR | Status: AC
  Administered 2013-08-12: 20 mg via INTRAVENOUS

## 2013-08-12 MED ORDER — DEXAMETHASONE SODIUM PHOSPHATE 20 MG/5ML IJ SOLN
INTRAMUSCULAR | Status: AC
  Filled 2013-08-12: qty 5

## 2013-08-12 MED ORDER — SODIUM CHLORIDE 0.9 % IV SOLN
Freq: Once | INTRAVENOUS | Status: AC
  Administered 2013-08-12: 12:00:00 via INTRAVENOUS

## 2013-08-12 MED ORDER — FAMOTIDINE IN NACL 20-0.9 MG/50ML-% IV SOLN
INTRAVENOUS | Status: AC
  Filled 2013-08-12: qty 50

## 2013-08-12 MED ORDER — HEPARIN SOD (PORK) LOCK FLUSH 100 UNIT/ML IV SOLN
500.0000 [IU] | Freq: Once | INTRAVENOUS | Status: AC | PRN
  Administered 2013-08-12: 500 [IU]
  Filled 2013-08-12: qty 5

## 2013-08-12 MED ORDER — SODIUM CHLORIDE 0.9 % IV SOLN
384.6000 mg | Freq: Once | INTRAVENOUS | Status: AC
  Administered 2013-08-12: 380 mg via INTRAVENOUS
  Filled 2013-08-12: qty 38

## 2013-08-12 MED ORDER — DIPHENHYDRAMINE HCL 50 MG/ML IJ SOLN
INTRAMUSCULAR | Status: AC
  Filled 2013-08-12: qty 1

## 2013-08-12 NOTE — Patient Instructions (Addendum)
[facility] Discharge Instructions for Patients Receiving Chemotherapy  Today you received the following chemotherapy agents:  Taxol and Carboplatin  To help prevent nausea and vomiting after your treatment, we encourage you to take your nausea medication as ordered per MD.   If you develop nausea and vomiting that is not controlled by your nausea medication, call the clinic.   BELOW ARE SYMPTOMS THAT SHOULD BE REPORTED IMMEDIATELY:  *FEVER GREATER THAN 100.5 F  *CHILLS WITH OR WITHOUT FEVER  NAUSEA AND VOMITING THAT IS NOT CONTROLLED WITH YOUR NAUSEA MEDICATION  *UNUSUAL SHORTNESS OF BREATH  *UNUSUAL BRUISING OR BLEEDING  TENDERNESS IN MOUTH AND THROAT WITH OR WITHOUT PRESENCE OF ULCERS  *URINARY PROBLEMS  *BOWEL PROBLEMS  UNUSUAL RASH Items with * indicate a potential emergency and should be followed up as soon as possible.  Feel free to call the clinic you have any questions or concerns. The clinic phone number is [phone].    

## 2013-08-12 NOTE — Telephone Encounter (Signed)
Per staff message and POF I have scheduled appts.  Pam Vazquez  

## 2013-08-12 NOTE — Progress Notes (Signed)
Patient states she feels well.  Her appetite is slightly decreased, however, she is trying to eat more often.  She is drinking more water.  She states tips to add lemon juice to water have helped her to consume more hydration.  Weight is stable overall and was documented as 151.4 pounds September 30.  Nutrition diagnosis: Unintended weight loss improved.  Intervention: Patient was educated to continue healthy diet with adequate calories and protein to promote weight maintenance.  Controlled carbohydrate diet was reinforced.  Teach back method used.  Monitoring, evaluation, goals: Patient has tolerated a healthier plant-based diet and has been able to maintain her weight.  She has decreased intake of concentrated sweets.  Next visit: Tuesday, October 21, during chemotherapy.

## 2013-08-12 NOTE — Patient Instructions (Signed)
Doing well.  Proceed with chemotherapy.  Please call us if you have any questions or concerns.    

## 2013-08-12 NOTE — Progress Notes (Addendum)
OFFICE PROGRESS NOTE  CC  [name],[name], MD [address] [place] [address] Dr. Emmaline Kluver Dr. 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 She had undergone a robotic assisted total laparoscopic hysterectomy bilateral salpingo-oophorectomy right pelvic lymph node biopsy on 12/19/2011 without any complications. 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 There was no involvement of other organs lymphovascular invasion was not identified. 3 lymph nodes were negative for metastatic disease. 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 Patient subsequently continued to be seen by gynecologic oncology. In October 2013 patient had a Pap smear that was normal limits there was noted to be some granulation tissue that was removed from the cough. There is no evidence of disease.  #2patient was seen by Dr. Zenda Alpers on 04/29/2013 she had vaginal exam performed that was notable for a nodule in the vagina concerning for recurrence. A rectal examination revealed a firm 8 cm mass also. Because of this she had a biopsy performed. She also had a CT scan of the abdomen and pelvis ordered to further evaluate the sigmoid mass. The pathology came back inconclusive. However the CT of the abdomen and pelvis showed a lobular mass at the vagina cuff measuring 7.0 x 3.2 cm proximally 4 cm craniocaudal dimension. The mass compressed the distal aspect of the sigmoid colon just above the rectum there was no mass approximates the posterior wall of the bladder without clear invasion noted evidence of  obstruction of stool proximal to this mass. There was also noted to be a large necrotic lymph node measuring 2.8 cm within the sigmoid mesocolon centrally. There are also small bilateral external iliac lymph nodes. There were no aggressive osseous lesions.   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  #4 patient was begun on palliative chemotherapy starting on 06/03/2013 with Taxol carboplatinum. Total of 6 cycles of treatment as planned.   #5 pain control: She is being managed with fentanyl patches and oxycodone.  CURRENT THERAPY: Taxol/Carbo cycle 4 day 1  INTERVAL HISTORY: Pam Vazquez 67 y.o. female returns for followup visit prior to her fourth cycle of taxol/carbo.  She had a CT chest/abd/pelvis on 9/24 and is requesting review of results.  She does endorse a decreased appetite due to food not being appealing.  She's been drinking water with lemon and milk and tea this week.  She's drinking 6-7 12 ounce glasses of fluids per day.  She is eating three entire meals per day with one snack.  Otherwise, she denies fevers, chills, nausea, vomiting, constipation, diarrhea, numbness or further concerns.    MEDICAL HISTORY: Past Medical History  Diagnosis Date  . Diabetes mellitus   . PONV (postoperative nausea and vomiting)   . Cancer     endometrial  . Angina     stress test on chart from 3/12/ none since March 2012  . Chronic kidney disease     kidney stones  . Hypertension     / EKG 1/13 Epic    ALLERGIES:  has No Known Allergies.  MEDICATIONS:  Current Outpatient Prescriptions  Medication Sig Dispense  Refill  . amLODipine (NORVASC) 2.5 MG tablet Take 2.5 mg by mouth every morning.       . cyanocobalamin 2000 MCG tablet Take 2,000 mcg by mouth 2 (two) times daily.      [name] dexamethasone (DECADRON) 4 MG tablet Take 2 tablets (8 mg total) by mouth 2 (two) times daily with a meal. Take  two times a day starting the day after chemotherapy for 3 days.  30 tablet  1  . doxycycline (VIBRA-TABS) 100 MG tablet Take 100 mg by mouth every other day.      . fentaNYL (DURAGESIC - DOSED MCG/HR) 25 MCG/HR patch Place 1 patch (25 mcg total) onto the skin every 3 (three) days.  10 patch  0  . lidocaine-prilocaine (EMLA) cream Apply topically as needed.  30 g  6  . lisinopril-hydrochlorothiazide (PRINZIDE,ZESTORETIC) 20-25 MG per tablet Take 1 tablet by mouth daily after breakfast.       . loratadine (CLARITIN) 10 MG tablet Take 10 mg by mouth daily.      . metFORMIN (GLUCOPHAGE) 500 MG tablet Take 500 mg by mouth 2 (two) times daily.       . metoprolol tartrate (LOPRESSOR) 25 MG tablet Take 25 mg by mouth 2 (two) times daily.       . Multiple Vitamin (MULTIVITAMIN WITH MINERALS) TABS Take 1 tablet by mouth every morning. She takes Surveyor, quantity.      [name] omeprazole (PRILOSEC) 40 MG capsule Take 1 capsule (40 mg total) by mouth daily.  30 capsule  6  . ondansetron (ZOFRAN) 8 MG tablet Take 1 tablet (8 mg total) by mouth 2 (two) times daily. Take two times a day starting the day after chemo for 3 days. Then take two times a day as needed for nausea or vomiting.  30 tablet  1  . prochlorperazine (COMPAZINE) 25 MG suppository Place 1 suppository (25 mg total) rectally every 12 (twelve) hours as needed for nausea.  12 suppository  3  . diphenhydrAMINE (SOMINEX) 25 MG tablet Take 25 mg by mouth at bedtime as needed for sleep.      [name] LORazepam (ATIVAN) 0.5 MG tablet Take 1 tablet (0.5 mg total) by mouth every 6 (six) hours as needed (Nausea or vomiting).  30 tablet  0  . naproxen sodium (ANAPROX) 220 MG tablet Take 220 mg by mouth daily as needed.      . Oxycodone HCl 10 MG TABS Take 1 tablet (10 mg total) by mouth every 4 (four) hours as needed (for moderate to severe pain).  80 tablet  0  . prochlorperazine (COMPAZINE) 10 MG tablet Take 1 tablet (10 mg total) by mouth every 6 (six) hours as needed  (Nausea or vomiting).  30 tablet  1   No current facility-administered medications for this visit.    SURGICAL HISTORY:  Past Surgical History  Procedure Laterality Date  . Hysteroscopy w/d&c  11/21/2011    Procedure: DILATATION AND CURETTAGE /HYSTEROSCOPY;  Surgeon: Miguel Aschoff;  Location: [facility];  Service: Gynecology;  Laterality: N/A;  . Tubal ligation    . Breast mass removal  1984, 1992    L breast  . Lithotripsy  2000, 2002  . Back surgery      91 and 2001  . Dilation and curettage of uterus    . Node dissection  12/19/2011    Procedure: NODE DISSECTION;  Surgeon: Laurette Schimke, MD PHD;  Location: [facility];  Service: Gynecology;  Laterality: N/A;  REVIEW OF SYSTEMS:  A 10 point review of systems was conducted and is otherwise negative except for what is noted above.     PHYSICAL EXAMINATION: Blood pressure 136/72, pulse 108, temperature 98.2 F (36.8 C), temperature source Oral, resp. rate 18, height 5' (1.524 m), weight 151 lb 6.4 oz (68.675 kg). Body mass index is 29.57 kg/(m^2). General: Patient is a well appearing female in no acute distress HEENT: PERRLA, sclerae anicteric no conjunctival pallor, MMM Neck: supple, no palpable adenopathy Lungs: clear to auscultation bilaterally, no wheezes, rhonchi, or rales Cardiovascular: regular rate rhythm, S1, S2, no murmurs, rubs or gallops Abdomen: Soft, non-tender, non-distended, normoactive bowel sounds, no HSM Extremities: warm and well perfused, no clubbing, cyanosis, or edema Skin: No rashes or lesions Neuro: Non-focal ECOG PERFORMANCE STATUS: 0 - Asymptomatic  LABORATORY DATA: Lab Results  Component Value Date   WBC 8.5 08/12/2013   HGB 10.3* 08/12/2013   HCT 30.6* 08/12/2013   MCV 97.5 08/12/2013   PLT 119* 08/12/2013      Chemistry      Component Value Date/Time   NA 141 08/12/2013 0953   NA 141 12/20/2011 0334   K 3.8 08/12/2013 0953   K 4.1 12/20/2011 0334   CL 104 04/29/2013 1229   CL 104 12/20/2011 0334   CO2 22  08/12/2013 0953   CO2 27 12/20/2011 0334   BUN 18.4 08/12/2013 0953   BUN 10 12/20/2011 0334   CREATININE 1.0 08/12/2013 0953   CREATININE 0.80 12/20/2011 0334      Component Value Date/Time   CALCIUM 11.0* 08/12/2013 0953   CALCIUM 9.5 12/20/2011 0334   ALKPHOS 71 08/12/2013 0953   ALKPHOS 69 12/15/2011 1125   AST 14 08/12/2013 0953   AST 22 12/15/2011 1125   ALT 12 08/12/2013 0953   ALT 19 12/15/2011 1125   BILITOT 0.45 08/12/2013 0953   BILITOT 0.8 12/15/2011 1125    ADDITIONAL INFORMATION: The malignant cells are positive for estrogen receptor, progesterone receptor, cytokeratin 7 and focally positive for CDX-2. They are negative for cytokeratin 20. Given the clinical history as well as the histologic features, the findings are consistent with metastatic endometrioid adenocarcinoma. (JBK:kh 05-19-13) Pecola Leisure MD Pathologist, Electronic Signature ( Signed 05/19/2013) FINAL DIAGNOSIS Diagnosis Pelvis, biopsy, Central lower - METASTATIC CARCINOMA WITH ASSOCIATED TUMOR NECROSIS. Microscopic Comment Immunostains will be performed and an addendum report will follow. (HCL:kh 05-15-13) Abigail Miyamoto MD Pathologist, Electronic Signature (Case signed 05/15/2013) Specimen Gross and Clinical Information Specimen(s) Obtained: Pelvis, biopsy, Central lower Specimen Clinical Information Recurrent cancer (tl) Gross Received in formalin are fragments and cores of soft white tissue having an aggregate measurement of 1.3 x 0.5 x 0.1 cm. The specimen is submitted in toto. (GRP: ecj 05/14/2013) 1 of 2 Duplicate copy FINAL for Marcom, Shaylea P ([id number]) Stain(s) used in Diagnosis: The following stain(s) were used in diagnosing the case: PR - NOACIS, ER - NOACIS, CK-7, CDX-2, CK 20. The control(s) stained appropriately. Disc   RADIOGRAPHIC STUDIES:  Dg Chest 2 View  05/06/2013   *RADIOLOGY REPORT*  Clinical Data: Endometrial cancer  CHEST - 2 VIEW  Comparison: December 15, 2011.  Findings:  Cardiomediastinal silhouette appears normal.  No acute pulmonary disease is noted.  Bony thorax is intact.  Stable small dense nodule seen in right lower lobe compared to prior exam most consistent with granuloma.  IMPRESSION: Stable small nodule seen in right lower lobe most consistent with granuloma.  No acute cardiopulmonary abnormality seen.   Original  Report Authenticated By: Lupita Raider.,  M.D.   Ct Abdomen Pelvis W Contrast  05/05/2013   *RADIOLOGY REPORT*  Clinical Data: Pelvic pain.  History of endometrial carcinoma.  CT ABDOMEN AND PELVIS WITH CONTRAST  Technique:  Multidetector CT imaging of the abdomen and pelvis was performed following the standard protocol during bolus administration of intravenous contrast.  Contrast: OMNIPAQUE IOHEXOL 300 MG/ML  SOLN  Comparison: CT 01/05/2006  Findings: Lung bases are clear.  No pericardial fluid.  No focal hepatic lesion.  The gallbladder, pancreas, spleen, adrenal glands, kidneys are normal.  There is a cortical scarring of the right kidney.  No hydronephrosis.  The stomach, small bowel, appendix, cecum are normal.  The colon is normal to the level of the descending colon.  There is a lobular mass at the vaginal cuff measuring 7.0 x 3.2 cm in axial dimension and approximately 4 cm craniocaudad dimension. There is a mass compresses the distal aspect of the sigmoid colon just above the rectum (image 62, axial, image 57, coronal series). No mass approximates the posterior wall the bladder without clear invasion .There is no evidence of obstruction of stool proximal to this mass compressing the sigmoid colon.  There is a large necrotic lymph node measuring 2.8 cm within the sigmoid mesocolon centrally (60).  Patient status post hysterectomy and oophorectomy.  Small bilateral external iliac lymph nodes.  For example 8 mm node on the right and 5 mm node on the left (image 59).  There is no periaortic adenopathy.  No periportal or mesenteric adenopathy   Review of the bone windows demonstrates post lumbar  fusion.  No aggressive osseous lesions.  IMPRESSION:  1.  Lobular mass at the vaginal cuff is consistent with endometrial carcinoma progression.  2.  Mass extends to the serosal surface of the sigmoid colon and compresses the lumen of the sigmoid colon.  No obstruction at this time.  3.  Mass  approximates the posterior wall of the bladder without clear invasion.  4. Large necrotic lymph node within the sigmoid mesocolon superior to the vaginal cuff.  5.  Small bilateral iliac lymph nodes.   Original Report Authenticated By: Genevive Bi, M.D.   Nm Pet Image Restag (ps) Skull Base To Thigh  05/09/2013   *RADIOLOGY REPORT*  Clinical Data: Subsequent treatment strategy for endometrial cancer. Hysterectomy 12/19/2011.  Recurrent pelvic masses on the CT.  NUCLEAR MEDICINE PET SKULL BASE TO THIGH  Fasting Blood Glucose:  141  Technique:  18.3 mCi F-18 FDG was injected intravenously. CT data was obtained and used for attenuation correction and anatomic localization only.  (This was not acquired as a diagnostic CT examination.) Additional exam technical data entered on technologist worksheet.  Comparison:  Abdominal pelvic CT 05/05/2013.  Findings:  Neck: No hypermetabolic lymph nodes in the neck.  Chest:  There are no hypermetabolic mediastinal or hilar lymph nodes.  There is no abnormal metabolic activity within the lungs. CT images demonstrate mild atherosclerosis.  There are no suspicious pulmonary findings.  Abdomen/Pelvis:  There is no abnormal metabolic activity within the liver, adrenal glands, spleen or pancreas.  No hypermetabolic lymph nodes are identified within the abdomen.  However, the large lobulated mass contiguous with the vaginal cuff is hypermetabolic. This demonstrates an SUV max of approximately 24.5.  The mass measures up to 6.8 x 3.6 cm transverse on image 186.  The necrotic nodal mass within the sigmoid the mesocolon is also hypermetabolic.  This measures 3.1 cm on image  175 and demonstrates an SUV max of 17.1.  The right external iliac and pelvic sidewall lymph nodes demonstrated on the recent CT are also hypermetabolic.  The largest node measures 2.0 cm on image 176 and has an SUV max of 17.1.  No hypermetabolic left pelvic side wall nodes are identified.  There is no generalized peritoneal hypermetabolic activity or ascites. Scattered activity in the colon is likely physiologic.  The right kidney demonstrates lobularity and scarring.  Skeleton:  No focal hypermetabolic activity to suggest skeletal metastasis.  IMPRESSION:  1.  Multifocal recurrence within the pelvis involving the vaginal cuff, mesenteric and right pelvic sidewall lymph nodes. 2.  No distant metastases identified.   Original Report Authenticated By: Carey Bullocks, M.D.   Ct Biopsy  05/14/2013   *RADIOLOGY REPORT*  Clinical history:67 year old with history of endometrial cancer and suspicious lesions in the pelvis.  PROCEDURE(S): CT GUIDED BIOPSY OF PELVIC MASS  Physician: Rachelle Hora. Henn, MD  Medications:Versed 2 mg, Fentanyl 100 mcg.  A radiology nurse monitored the patient for moderate sedation.  Moderate sedation time:28 minutes  Procedure:The procedure was explained to the patient.  The risks and benefits of the procedure were discussed and the patient's questions were addressed.  Informed consent was obtained from the patient.  The patient was placed prone on the CT scanner.  Images of the pelvis were obtained.  The left gluteal area was prepped and draped in a sterile fashion.  Skin and soft tissues were anesthetized with lidocaine.  17 gauge needle was directed into the pelvic mass from a left transgluteal approach.  Needle position confirmed along the posterior aspect of the lesion.  Three core biopsies were obtained with an 18 gauge device.  Samples placed in formalin.  17 gauge needle was removed without complication.  Findings:There is a pelvic mass situated adjacent to the  vaginal cuff.  The needle was positioned along the posterior aspect of the lesion.  Complications: None  Impression:CT guided core biopsies of the pelvic mass.   Original Report Authenticated By: Richarda Overlie, M.D.    ASSESSMENT: 67 year old female with  #1 recurrent endometrioid adenocarcinoma of the uterus. Patient is receiving palliative chemotherapy consisting of Taxol and carboplatinum.   #2 patient underwent Port-A-Cath placement by interventional radiology and underwent chemotherapy teaching class.   #3 patient was begun on Taxol and carboplatinum starting on 06/03/2013. Today is cycle 4 day 1  #4 pain control patient is on fentanyl patches as well as oxycodone. Her pain is really well controlled, and she takes the oxycodone very infrequently  #5  patient is recommended to exercise and eat a healthy diet.  PLAN:  #1 Doing well. Patient will proceed with chemotherapy today.  Her CT scans were consistent with interval response to chemotherapy.  I reviewed them again with the patient and family.    #2 Patient will return tomorrow for neulasta and in one week for labs and an appointment.    All questions were answered. The patient knows to call the clinic with any problems, questions or concerns. We can certainly see the patient much sooner if necessary.  I spent 25 minutes counseling the patient face to face. The total time spent in the appointment was 30 minutes.  Cherie Ouch Lyn Hollingshead, NP Medical Oncology [facility] Phone: [phone]  ATTENDING'S ATTESTATION:  I personally reviewed patient's chart, examined patient myself, formulated the treatment plan as followed.    Overall patient is doing remarkably well. He did discuss her CT  scan results after 3 cycles of chemotherapy he has had an interval response to her chemotherapy. Clinically she's doing well no problems. She will proceed with cycle 4 day 1 of her Taxol and carboplatinum. We do plan on doing another  set of scans after she completes a total of another 3 cycles.  Drue Second, MD Medical/Oncology [facility] [phone] (beeper) [phone] (Office)  08/14/2013, 11:19 AM

## 2013-08-13 ENCOUNTER — Ambulatory Visit (HOSPITAL_BASED_OUTPATIENT_CLINIC_OR_DEPARTMENT_OTHER): Payer: Medicare Other

## 2013-08-13 VITALS — BP 122/59 | HR 97 | Temp 98.1°F

## 2013-08-13 DIAGNOSIS — Z5189 Encounter for other specified aftercare: Secondary | ICD-10-CM

## 2013-08-13 DIAGNOSIS — C541 Malignant neoplasm of endometrium: Secondary | ICD-10-CM

## 2013-08-13 DIAGNOSIS — C549 Malignant neoplasm of corpus uteri, unspecified: Secondary | ICD-10-CM

## 2013-08-13 MED ORDER — PEGFILGRASTIM INJECTION 6 MG/0.6ML
6.0000 mg | Freq: Once | SUBCUTANEOUS | Status: AC
  Administered 2013-08-13: 6 mg via SUBCUTANEOUS
  Filled 2013-08-13: qty 0.6

## 2013-08-19 ENCOUNTER — Ambulatory Visit (HOSPITAL_BASED_OUTPATIENT_CLINIC_OR_DEPARTMENT_OTHER): Payer: Medicare Other | Admitting: Adult Health

## 2013-08-19 ENCOUNTER — Other Ambulatory Visit (HOSPITAL_BASED_OUTPATIENT_CLINIC_OR_DEPARTMENT_OTHER): Payer: Medicare Other | Admitting: Lab

## 2013-08-19 ENCOUNTER — Ambulatory Visit (HOSPITAL_BASED_OUTPATIENT_CLINIC_OR_DEPARTMENT_OTHER): Payer: Medicare Other

## 2013-08-19 ENCOUNTER — Encounter: Payer: Self-pay | Admitting: Adult Health

## 2013-08-19 VITALS — BP 103/64 | HR 89 | Temp 97.8°F | Resp 18 | Ht 60.0 in | Wt 149.5 lb

## 2013-08-19 DIAGNOSIS — C549 Malignant neoplasm of corpus uteri, unspecified: Secondary | ICD-10-CM

## 2013-08-19 DIAGNOSIS — R209 Unspecified disturbances of skin sensation: Secondary | ICD-10-CM

## 2013-08-19 DIAGNOSIS — R109 Unspecified abdominal pain: Secondary | ICD-10-CM

## 2013-08-19 DIAGNOSIS — E860 Dehydration: Secondary | ICD-10-CM

## 2013-08-19 DIAGNOSIS — G629 Polyneuropathy, unspecified: Secondary | ICD-10-CM

## 2013-08-19 DIAGNOSIS — C541 Malignant neoplasm of endometrium: Secondary | ICD-10-CM

## 2013-08-19 LAB — CBC WITH DIFFERENTIAL/PLATELET
BASO%: 0.4 % (ref 0.0–2.0)
Basophils Absolute: 0 10*3/uL (ref 0.0–0.1)
Eosinophils Absolute: 0.1 10*3/uL (ref 0.0–0.5)
HGB: 9.8 g/dL — ABNORMAL LOW (ref 11.6–15.9)
LYMPH%: 43.3 % (ref 14.0–49.7)
MONO#: 0.8 10*3/uL (ref 0.1–0.9)
NEUT#: 1.9 10*3/uL (ref 1.5–6.5)
Platelets: 148 10*3/uL (ref 145–400)
RBC: 2.84 10*6/uL — ABNORMAL LOW (ref 3.70–5.45)
RDW: 21.2 % — ABNORMAL HIGH (ref 11.2–14.5)
WBC: 4.8 10*3/uL (ref 3.9–10.3)

## 2013-08-19 LAB — COMPREHENSIVE METABOLIC PANEL (CC13)
Albumin: 3.6 g/dL (ref 3.5–5.0)
Anion Gap: 10 mEq/L (ref 3–11)
CO2: 28 mEq/L (ref 22–29)
Calcium: 11 mg/dL — ABNORMAL HIGH (ref 8.4–10.4)
Chloride: 101 mEq/L (ref 98–109)
Glucose: 169 mg/dl — ABNORMAL HIGH (ref 70–140)
Potassium: 4.5 mEq/L (ref 3.5–5.1)
Sodium: 139 mEq/L (ref 136–145)
Total Protein: 6.5 g/dL (ref 6.4–8.3)

## 2013-08-19 MED ORDER — GABAPENTIN 100 MG PO CAPS: 100.0000 mg | ORAL_CAPSULE | Freq: Three times a day (TID) | ORAL | Status: DC

## 2013-08-19 MED ORDER — SODIUM CHLORIDE 0.9 % IV SOLN
Freq: Once | INTRAVENOUS | Status: AC
  Administered 2013-08-19: 17:00:00 via INTRAVENOUS

## 2013-08-19 MED ORDER — FENTANYL 25 MCG/HR TD PT72: 1.0000 | MEDICATED_PATCH | TRANSDERMAL | Status: DC

## 2013-08-19 NOTE — Progress Notes (Signed)
OFFICE PROGRESS NOTE  CC  [name],[name], MD [address], [place] [address] Dr. Marvia Pickles [name] 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 She had undergone a robotic assisted total laparoscopic hysterectomy bilateral salpingo-oophorectomy right pelvic lymph node biopsy on 12/19/2011 without any complications. 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 There was no involvement of other organs lymphovascular invasion was not identified. 3 lymph nodes were negative for metastatic disease. 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 Patient subsequently continued to be seen by gynecologic oncology. In October 2013 patient had a Pap smear that was normal limits there was noted to be some granulation tissue that was removed from the cough. There is no evidence of disease.  #2patient was seen by Dr. Zenda Alpers on 04/29/2013 she had vaginal exam performed that was notable for a nodule in the vagina concerning for recurrence. A rectal examination revealed a firm 8 cm mass also. Because of this she had a biopsy performed. She also had a CT scan of the abdomen and pelvis ordered to further evaluate the sigmoid mass. The pathology came back inconclusive. However the CT of the abdomen and pelvis showed a lobular mass at the vagina cuff measuring 7.0 x 3.2 cm proximally 4 cm craniocaudal dimension. The mass compressed the distal aspect of the sigmoid colon just above the rectum there was no mass approximates the posterior wall of the bladder without clear invasion noted evidence of  obstruction of stool proximal to this mass. There was also noted to be a large necrotic lymph node measuring 2.8 cm within the sigmoid mesocolon centrally. There are also small bilateral external iliac lymph nodes. There were no aggressive osseous lesions.   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  #4 patient was begun on palliative chemotherapy starting on 06/03/2013 with Taxol carboplatinum. Total of 6 cycles of treatment as planned.   #5 pain control: She is being managed with fentanyl patches and oxycodone.  CURRENT THERAPY: Taxol/Carbo cycle 4 day 8  INTERVAL HISTORY: Pam Vazquez 66 y.o. female returns for followup visit following her fourth cycle of taxol/carbo.  She is experiencing numbness in her toes, and she has increasing pain in her legs.  This is a general pain, she is unable to put it into words.  She did recently get Neulasta and it was worst 4 days after.  She has had four bowel movements today, and has drank four glasses of water so far today.  She is requesting a refill of her fentanyl patches, and that her benadryl in her treatment plan be decreased due to restless leg that she experienced during treatment last week.  Otherwise, a 10 point ROS is neg.   MEDICAL HISTORY: Past Medical History  Diagnosis Date  . Diabetes mellitus   . PONV (postoperative nausea and vomiting)   . Cancer     endometrial  . Angina     stress test on chart from 3/12/ none since March 2012  . Chronic kidney disease     kidney stones  . Hypertension     /  EKG 1/13 Epic    ALLERGIES:  has No Known Allergies.  MEDICATIONS:  Current Outpatient Prescriptions  Medication Sig Dispense Refill  . amLODipine (NORVASC) 2.5 MG tablet Take 2.5 mg by mouth every morning.       . cyanocobalamin 2000 MCG tablet Take 2,000 mcg by mouth 2 (two) times daily.      [name] dexamethasone (DECADRON) 4 MG tablet  Take 2 tablets (8 mg total) by mouth 2 (two) times daily with a meal. Take two times a day starting the day after chemotherapy for 3 days.  30 tablet  1  . diphenhydrAMINE (SOMINEX) 25 MG tablet Take 25 mg by mouth at bedtime as needed for sleep.      [name] doxycycline (VIBRA-TABS) 100 MG tablet Take 100 mg by mouth every other day.      . fentaNYL (DURAGESIC - DOSED MCG/HR) 25 MCG/HR patch Place 1 patch (25 mcg total) onto the skin every 3 (three) days.  10 patch  0  . lidocaine-prilocaine (EMLA) cream Apply topically as needed.  30 g  6  . lisinopril-hydrochlorothiazide (PRINZIDE,ZESTORETIC) 20-25 MG per tablet Take 1 tablet by mouth daily after breakfast.       . loratadine (CLARITIN) 10 MG tablet Take 10 mg by mouth daily.      [name] LORazepam (ATIVAN) 0.5 MG tablet Take 1 tablet (0.5 mg total) by mouth every 6 (six) hours as needed (Nausea or vomiting).  30 tablet  0  . metFORMIN (GLUCOPHAGE) 500 MG tablet Take 500 mg by mouth 2 (two) times daily.       . metoprolol tartrate (LOPRESSOR) 25 MG tablet Take 25 mg by mouth 2 (two) times daily.       . Multiple Vitamin (MULTIVITAMIN WITH MINERALS) TABS Take 1 tablet by mouth every morning. She takes Surveyor, quantity.      . naproxen sodium (ANAPROX) 220 MG tablet Take 220 mg by mouth daily as needed.      [name] omeprazole (PRILOSEC) 40 MG capsule Take 1 capsule (40 mg total) by mouth daily.  30 capsule  6  . ondansetron (ZOFRAN) 8 MG tablet Take 1 tablet (8 mg total) by mouth 2 (two) times daily. Take two times a day starting the day after chemo for 3 days. Then take two times a day as needed for nausea or vomiting.  30 tablet  1  . Oxycodone HCl 10 MG TABS Take 1 tablet (10 mg total) by mouth every 4 (four) hours as needed (for moderate to severe pain).  80 tablet  0  . prochlorperazine (COMPAZINE) 10 MG tablet Take 1 tablet (10 mg total) by mouth every 6 (six) hours as needed (Nausea or vomiting).  30 tablet  1  . prochlorperazine (COMPAZINE) 25 MG suppository  Place 1 suppository (25 mg total) rectally every 12 (twelve) hours as needed for nausea.  12 suppository  3  . gabapentin (NEURONTIN) 100 MG capsule Take 1 capsule (100 mg total) by mouth 3 (three) times daily.  90 capsule  0   No current facility-administered medications for this visit.    SURGICAL HISTORY:  Past Surgical History  Procedure Laterality Date  . Hysteroscopy w/d&c  11/21/2011    Procedure: DILATATION AND CURETTAGE /HYSTEROSCOPY;  Surgeon: Miguel Aschoff;  Location: [facility];  Service: Gynecology;  Laterality: N/A;  . Tubal ligation    . Breast mass removal  1984, 1992    L breast  . Lithotripsy  2000, 2002  . Back surgery  91 and 2001  . Dilation and curettage of uterus    . Node dissection  12/19/2011    Procedure: NODE DISSECTION;  Surgeon: Laurette Schimke, MD PHD;  Location: [facility];  Service: Gynecology;  Laterality: N/A;    REVIEW OF SYSTEMS:  A 10 point review of systems was conducted and is otherwise negative except for what is noted above.     PHYSICAL EXAMINATION: Blood pressure 103/64, pulse 89, temperature 97.8 F (36.6 C), temperature source Oral, resp. rate 18, height 5' (1.524 m), weight 149 lb 8 oz (67.813 kg). Body mass index is 29.2 kg/(m^2). General: Patient is a well appearing female in no acute distress HEENT: PERRLA, sclerae anicteric no conjunctival pallor, MMM Neck: supple, no palpable adenopathy Lungs: clear to auscultation bilaterally, no wheezes, rhonchi, or rales Cardiovascular: regular rate rhythm, S1, S2, no murmurs, rubs or gallops Abdomen: Soft, non-tender, non-distended, normoactive bowel sounds, no HSM Extremities: warm and well perfused, no clubbing, cyanosis, or edema Skin: No rashes or lesions Neuro: Non-focal ECOG PERFORMANCE STATUS: 0 - Asymptomatic  LABORATORY DATA: Lab Results  Component Value Date   WBC 4.8 08/19/2013   HGB 9.8* 08/19/2013   HCT 28.6* 08/19/2013   MCV 100.8 08/19/2013   PLT 148 08/19/2013      Chemistry       Component Value Date/Time   NA 139 08/19/2013 1101   NA 141 12/20/2011 0334   K 4.5 08/19/2013 1101   K 4.1 12/20/2011 0334   CL 104 04/29/2013 1229   CL 104 12/20/2011 0334   CO2 28 08/19/2013 1101   CO2 27 12/20/2011 0334   BUN 26.9* 08/19/2013 1101   BUN 10 12/20/2011 0334   CREATININE 1.0 08/19/2013 1101   CREATININE 0.80 12/20/2011 0334      Component Value Date/Time   CALCIUM 11.0* 08/19/2013 1101   CALCIUM 9.5 12/20/2011 0334   ALKPHOS 71 08/19/2013 1101   ALKPHOS 69 12/15/2011 1125   AST 14 08/19/2013 1101   AST 22 12/15/2011 1125   ALT 18 08/19/2013 1101   ALT 19 12/15/2011 1125   BILITOT 0.65 08/19/2013 1101   BILITOT 0.8 12/15/2011 1125    ADDITIONAL INFORMATION: The malignant cells are positive for estrogen receptor, progesterone receptor, cytokeratin 7 and focally positive for CDX-2. They are negative for cytokeratin 20. Given the clinical history as well as the histologic features, the findings are consistent with metastatic endometrioid adenocarcinoma. (JBK:kh 05-19-13) Pecola Leisure MD Pathologist, Electronic Signature ( Signed 05/19/2013) FINAL DIAGNOSIS Diagnosis Pelvis, biopsy, Central lower - METASTATIC CARCINOMA WITH ASSOCIATED TUMOR NECROSIS. Microscopic Comment Immunostains will be performed and an addendum report will follow. (HCL:kh 05-15-13) Abigail Miyamoto MD Pathologist, Electronic Signature (Case signed 05/15/2013) Specimen Gross and Clinical Information Specimen(s) Obtained: Pelvis, biopsy, Central lower Specimen Clinical Information Recurrent cancer (tl) Gross Received in formalin are fragments and cores of soft white tissue having an aggregate measurement of 1.3 x 0.5 x 0.1 cm. The specimen is submitted in toto. (GRP: ecj 05/14/2013) 1 of 2 Duplicate copy FINAL for Lineman, Muslima P ([id number]) Stain(s) used in Diagnosis: The following stain(s) were used in diagnosing the case: PR - NOACIS, ER - NOACIS, CK-7, CDX-2, CK 20. The control(s) stained  appropriately. Disc   RADIOGRAPHIC STUDIES:  Dg Chest 2 View  05/06/2013   *RADIOLOGY REPORT*  Clinical Data: Endometrial cancer  CHEST - 2 VIEW  Comparison: December 15, 2011.  Findings: Cardiomediastinal silhouette appears normal.  No acute pulmonary disease is noted.  Bony thorax  is intact.  Stable small dense nodule seen in right lower lobe compared to prior exam most consistent with granuloma.  IMPRESSION: Stable small nodule seen in right lower lobe most consistent with granuloma.  No acute cardiopulmonary abnormality seen.   Original Report Authenticated By: Lupita Raider.,  M.D.   Ct Abdomen Pelvis W Contrast  05/05/2013   *RADIOLOGY REPORT*  Clinical Data: Pelvic pain.  History of endometrial carcinoma.  CT ABDOMEN AND PELVIS WITH CONTRAST  Technique:  Multidetector CT imaging of the abdomen and pelvis was performed following the standard protocol during bolus administration of intravenous contrast.  Contrast: OMNIPAQUE IOHEXOL 300 MG/ML  SOLN  Comparison: CT 01/05/2006  Findings: Lung bases are clear.  No pericardial fluid.  No focal hepatic lesion.  The gallbladder, pancreas, spleen, adrenal glands, kidneys are normal.  There is a cortical scarring of the right kidney.  No hydronephrosis.  The stomach, small bowel, appendix, cecum are normal.  The colon is normal to the level of the descending colon.  There is a lobular mass at the vaginal cuff measuring 7.0 x 3.2 cm in axial dimension and approximately 4 cm craniocaudad dimension. There is a mass compresses the distal aspect of the sigmoid colon just above the rectum (image 62, axial, image 57, coronal series). No mass approximates the posterior wall the bladder without clear invasion .There is no evidence of obstruction of stool proximal to this mass compressing the sigmoid colon.  There is a large necrotic lymph node measuring 2.8 cm within the sigmoid mesocolon centrally (60).  Patient status post hysterectomy and oophorectomy.  Small  bilateral external iliac lymph nodes.  For example 8 mm node on the right and 5 mm node on the left (image 59).  There is no periaortic adenopathy.  No periportal or mesenteric adenopathy  Review of the bone windows demonstrates post lumbar  fusion.  No aggressive osseous lesions.  IMPRESSION:  1.  Lobular mass at the vaginal cuff is consistent with endometrial carcinoma progression.  2.  Mass extends to the serosal surface of the sigmoid colon and compresses the lumen of the sigmoid colon.  No obstruction at this time.  3.  Mass  approximates the posterior wall of the bladder without clear invasion.  4. Large necrotic lymph node within the sigmoid mesocolon superior to the vaginal cuff.  5.  Small bilateral iliac lymph nodes.   Original Report Authenticated By: Genevive Bi, M.D.   Nm Pet Image Restag (ps) Skull Base To Thigh  05/09/2013   *RADIOLOGY REPORT*  Clinical Data: Subsequent treatment strategy for endometrial cancer. Hysterectomy 12/19/2011.  Recurrent pelvic masses on the CT.  NUCLEAR MEDICINE PET SKULL BASE TO THIGH  Fasting Blood Glucose:  141  Technique:  18.3 mCi F-18 FDG was injected intravenously. CT data was obtained and used for attenuation correction and anatomic localization only.  (This was not acquired as a diagnostic CT examination.) Additional exam technical data entered on technologist worksheet.  Comparison:  Abdominal pelvic CT 05/05/2013.  Findings:  Neck: No hypermetabolic lymph nodes in the neck.  Chest:  There are no hypermetabolic mediastinal or hilar lymph nodes.  There is no abnormal metabolic activity within the lungs. CT images demonstrate mild atherosclerosis.  There are no suspicious pulmonary findings.  Abdomen/Pelvis:  There is no abnormal metabolic activity within the liver, adrenal glands, spleen or pancreas.  No hypermetabolic lymph nodes are identified within the abdomen.  However, the large lobulated mass contiguous with the vaginal cuff is  hypermetabolic. This  demonstrates an SUV max of approximately 24.5.  The mass measures up to 6.8 x 3.6 cm transverse on image 186.  The necrotic nodal mass within the sigmoid the mesocolon is also hypermetabolic. This measures 3.1 cm on image 175 and demonstrates an SUV max of 17.1.  The right external iliac and pelvic sidewall lymph nodes demonstrated on the recent CT are also hypermetabolic.  The largest node measures 2.0 cm on image 176 and has an SUV max of 17.1.  No hypermetabolic left pelvic side wall nodes are identified.  There is no generalized peritoneal hypermetabolic activity or ascites. Scattered activity in the colon is likely physiologic.  The right kidney demonstrates lobularity and scarring.  Skeleton:  No focal hypermetabolic activity to suggest skeletal metastasis.  IMPRESSION:  1.  Multifocal recurrence within the pelvis involving the vaginal cuff, mesenteric and right pelvic sidewall lymph nodes. 2.  No distant metastases identified.   Original Report Authenticated By: Carey Bullocks, M.D.   Ct Biopsy  05/14/2013   *RADIOLOGY REPORT*  Clinical history:67 year old with history of endometrial cancer and suspicious lesions in the pelvis.  PROCEDURE(S): CT GUIDED BIOPSY OF PELVIC MASS  Physician: Rachelle Hora. Henn, MD  Medications:Versed 2 mg, Fentanyl 100 mcg.  A radiology nurse monitored the patient for moderate sedation.  Moderate sedation time:28 minutes  Procedure:The procedure was explained to the patient.  The risks and benefits of the procedure were discussed and the patient's questions were addressed.  Informed consent was obtained from the patient.  The patient was placed prone on the CT scanner.  Images of the pelvis were obtained.  The left gluteal area was prepped and draped in a sterile fashion.  Skin and soft tissues were anesthetized with lidocaine.  17 gauge needle was directed into the pelvic mass from a left transgluteal approach.  Needle position confirmed along the posterior aspect of the lesion.   Three core biopsies were obtained with an 18 gauge device.  Samples placed in formalin.  17 gauge needle was removed without complication.  Findings:There is a pelvic mass situated adjacent to the vaginal cuff.  The needle was positioned along the posterior aspect of the lesion.  Complications: None  Impression:CT guided core biopsies of the pelvic mass.   Original Report Authenticated By: Richarda Overlie, M.D.    ASSESSMENT: 67 year old female with  #1 recurrent endometrioid adenocarcinoma of the uterus. Patient is receiving palliative chemotherapy consisting of Taxol and carboplatinum.   #2 patient underwent Port-A-Cath placement by interventional radiology and underwent chemotherapy teaching class.   #3 patient was begun on Taxol and carboplatinum starting on 06/03/2013. Today is cycle 4 day 8  #4 pain control patient is on fentanyl patches as well as oxycodone. Her pain is really well controlled, and she takes the oxycodone very infrequently  #5  patient is recommended to exercise and eat a healthy diet.  PLAN:  #1 Doing well.  Labs are stable.  I reviewed them with her in detail.  Due to the dehydartion she will receive IV fluids today.    #2 For the numbness she is beginning to experience I prescribed Gabapentin for her to take three times per day.  She understands the details of this medication as I have reviewed it with her today as well as during previous appointments.    #3 She will return in 2 weeks for labs and an appointment prior to her fifth cycle of chemotherapy.     All questions were answered. The patient  knows to call the clinic with any problems, questions or concerns. We can certainly see the patient much sooner if necessary.  I spent 25 minutes counseling the patient face to face. The total time spent in the appointment was 30 minutes.  Cherie Ouch Lyn Hollingshead, NP Medical Oncology [facility] Phone: [phone] 08/20/2013, 7:21 PM

## 2013-08-26 ENCOUNTER — Other Ambulatory Visit: Payer: Medicare Other | Admitting: Lab

## 2013-08-27 ENCOUNTER — Ambulatory Visit: Payer: Medicare Other

## 2013-08-28 ENCOUNTER — Other Ambulatory Visit: Payer: Self-pay

## 2013-08-28 DIAGNOSIS — G629 Polyneuropathy, unspecified: Secondary | ICD-10-CM

## 2013-08-28 MED ORDER — GABAPENTIN 100 MG PO CAPS: 100.0000 mg | ORAL_CAPSULE | Freq: Three times a day (TID) | ORAL | Status: DC

## 2013-09-02 ENCOUNTER — Ambulatory Visit: Payer: Medicare Other | Admitting: Nutrition

## 2013-09-02 ENCOUNTER — Ambulatory Visit (HOSPITAL_BASED_OUTPATIENT_CLINIC_OR_DEPARTMENT_OTHER): Payer: Medicare Other

## 2013-09-02 ENCOUNTER — Encounter: Payer: Self-pay | Admitting: Adult Health

## 2013-09-02 ENCOUNTER — Other Ambulatory Visit (HOSPITAL_BASED_OUTPATIENT_CLINIC_OR_DEPARTMENT_OTHER): Payer: Medicare Other | Admitting: Lab

## 2013-09-02 ENCOUNTER — Ambulatory Visit (HOSPITAL_BASED_OUTPATIENT_CLINIC_OR_DEPARTMENT_OTHER): Payer: Medicare Other | Admitting: Adult Health

## 2013-09-02 VITALS — BP 133/81 | HR 99 | Temp 98.2°F | Resp 18 | Ht 60.0 in | Wt 151.2 lb

## 2013-09-02 DIAGNOSIS — C549 Malignant neoplasm of corpus uteri, unspecified: Secondary | ICD-10-CM

## 2013-09-02 DIAGNOSIS — Z5111 Encounter for antineoplastic chemotherapy: Secondary | ICD-10-CM

## 2013-09-02 DIAGNOSIS — C541 Malignant neoplasm of endometrium: Secondary | ICD-10-CM

## 2013-09-02 DIAGNOSIS — G589 Mononeuropathy, unspecified: Secondary | ICD-10-CM

## 2013-09-02 LAB — COMPREHENSIVE METABOLIC PANEL (CC13)
ALT: 14 U/L (ref 0–55)
AST: 16 U/L (ref 5–34)
Calcium: 11.1 mg/dL — ABNORMAL HIGH (ref 8.4–10.4)
Chloride: 106 mEq/L (ref 98–109)
Creatinine: 0.9 mg/dL (ref 0.6–1.1)
Glucose: 170 mg/dl — ABNORMAL HIGH (ref 70–140)
Potassium: 3.9 mEq/L (ref 3.5–5.1)

## 2013-09-02 LAB — CBC WITH DIFFERENTIAL/PLATELET
BASO%: 0.1 % (ref 0.0–2.0)
LYMPH%: 6.3 % — ABNORMAL LOW (ref 14.0–49.7)
MCHC: 33.1 g/dL (ref 31.5–36.0)
MONO#: 0.1 10*3/uL (ref 0.1–0.9)
RBC: 2.78 10*6/uL — ABNORMAL LOW (ref 3.70–5.45)
RDW: 19.8 % — ABNORMAL HIGH (ref 11.2–14.5)
WBC: 10.6 10*3/uL — ABNORMAL HIGH (ref 3.9–10.3)
lymph#: 0.7 10*3/uL — ABNORMAL LOW (ref 0.9–3.3)
nRBC: 0 % (ref 0–0)

## 2013-09-02 MED ORDER — ONDANSETRON 16 MG/50ML IVPB (CHCC)
16.0000 mg | Freq: Once | INTRAVENOUS | Status: AC
  Administered 2013-09-02: 16 mg via INTRAVENOUS

## 2013-09-02 MED ORDER — FAMOTIDINE IN NACL 20-0.9 MG/50ML-% IV SOLN
INTRAVENOUS | Status: AC
  Filled 2013-09-02: qty 50

## 2013-09-02 MED ORDER — DIPHENHYDRAMINE HCL 50 MG/ML IJ SOLN
INTRAMUSCULAR | Status: AC
  Filled 2013-09-02: qty 1

## 2013-09-02 MED ORDER — ONDANSETRON 16 MG/50ML IVPB (CHCC)
INTRAVENOUS | Status: AC
  Filled 2013-09-02: qty 16

## 2013-09-02 MED ORDER — SODIUM CHLORIDE 0.9 % IV SOLN
530.0000 mg | Freq: Once | INTRAVENOUS | Status: AC
  Administered 2013-09-02: 530 mg via INTRAVENOUS
  Filled 2013-09-02: qty 53

## 2013-09-02 MED ORDER — SODIUM CHLORIDE 0.9 % IV SOLN
Freq: Once | INTRAVENOUS | Status: AC
  Administered 2013-09-02: 12:00:00 via INTRAVENOUS

## 2013-09-02 MED ORDER — PACLITAXEL CHEMO INJECTION 300 MG/50ML
175.0000 mg/m2 | Freq: Once | INTRAVENOUS | Status: AC
  Administered 2013-09-02: 306 mg via INTRAVENOUS
  Filled 2013-09-02: qty 51

## 2013-09-02 MED ORDER — SODIUM CHLORIDE 0.9 % IJ SOLN
10.0000 mL | INTRAMUSCULAR | Status: DC | PRN
Start: 2013-09-02 — End: 2013-09-02
  Administered 2013-09-02: 10 mL
  Filled 2013-09-02: qty 10

## 2013-09-02 MED ORDER — DEXAMETHASONE SODIUM PHOSPHATE 20 MG/5ML IJ SOLN
20.0000 mg | Freq: Once | INTRAMUSCULAR | Status: AC
  Administered 2013-09-02: 20 mg via INTRAVENOUS

## 2013-09-02 MED ORDER — DEXAMETHASONE SODIUM PHOSPHATE 20 MG/5ML IJ SOLN
INTRAMUSCULAR | Status: AC
  Filled 2013-09-02: qty 5

## 2013-09-02 MED ORDER — FAMOTIDINE IN NACL 20-0.9 MG/50ML-% IV SOLN
20.0000 mg | Freq: Once | INTRAVENOUS | Status: AC
  Administered 2013-09-02: 20 mg via INTRAVENOUS

## 2013-09-02 MED ORDER — DIPHENHYDRAMINE HCL 50 MG/ML IJ SOLN
25.0000 mg | Freq: Once | INTRAMUSCULAR | Status: AC
  Administered 2013-09-02: 25 mg via INTRAVENOUS

## 2013-09-02 MED ORDER — HEPARIN SOD (PORK) LOCK FLUSH 100 UNIT/ML IV SOLN
500.0000 [IU] | Freq: Once | INTRAVENOUS | Status: AC | PRN
  Administered 2013-09-02: 500 [IU]
  Filled 2013-09-02: qty 5

## 2013-09-02 NOTE — Progress Notes (Signed)
Patient feels well.  States she drank 80 ounces water yesterday.  She does not check her blood sugars at home.  Noted non-fasting glucose level was 170 this morning.  Weight is stable and was documented as 151.2 pounds. No new nutrition concerns.  Nutrition Diagnosis:  Unintended weight loss improved.  Intervention:  Patient educated to continue healthy diet with adequate calories and protein to maintain weight.  Controlled CHO diet was enforced.  Next visit: Tuesday, November 11 during chemotherapy.

## 2013-09-02 NOTE — Patient Instructions (Signed)
Doing well  Proceed with chemotherapy.  Should your numbness worsen this week, go ahead and take Gabapentin/neurontin 200mg  (2 tablets) three times per day.  WE will see you back in 1 week.    Please call us if you have any questions or concerns.

## 2013-09-02 NOTE — Patient Instructions (Signed)
[facility] Discharge Instructions for Patients Receiving Chemotherapy  Today you received the following chemotherapy agents: Taxol and Carboplatin.  To help prevent nausea and vomiting after your treatment, we encourage you to take your nausea medication as prescribed.   If you develop nausea and vomiting that is not controlled by your nausea medication, call the clinic.   BELOW ARE SYMPTOMS THAT SHOULD BE REPORTED IMMEDIATELY:  *FEVER GREATER THAN 100.5 F  *CHILLS WITH OR WITHOUT FEVER  NAUSEA AND VOMITING THAT IS NOT CONTROLLED WITH YOUR NAUSEA MEDICATION  *UNUSUAL SHORTNESS OF BREATH  *UNUSUAL BRUISING OR BLEEDING  TENDERNESS IN MOUTH AND THROAT WITH OR WITHOUT PRESENCE OF ULCERS  *URINARY PROBLEMS  *BOWEL PROBLEMS  UNUSUAL RASH Items with * indicate a potential emergency and should be followed up as soon as possible.  Feel free to call the clinic you have any questions or concerns. The clinic phone number is [phone].    

## 2013-09-02 NOTE — Progress Notes (Signed)
OFFICE PROGRESS NOTE  CC  [name],[name], MD [address] [place] [address] Dr. Marvia Pickles [name] 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 She had undergone a robotic assisted total laparoscopic hysterectomy bilateral salpingo-oophorectomy right pelvic lymph node biopsy on 12/19/2011 without any complications. 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 There was no involvement of other organs lymphovascular invasion was not identified. 3 lymph nodes were negative for metastatic disease. 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 Patient subsequently continued to be seen by gynecologic oncology. In October 2013 patient had a Pap smear that was normal limits there was noted to be some granulation tissue that was removed from the cough. There is no evidence of disease.  #2patient was seen by Dr. Zenda Alpers on 04/29/2013 she had vaginal exam performed that was notable for a nodule in the vagina concerning for recurrence. A rectal examination revealed a firm 8 cm mass also. Because of this she had a biopsy performed. She also had a CT scan of the abdomen and pelvis ordered to further evaluate the sigmoid mass. The pathology came back inconclusive. However the CT of the abdomen and pelvis showed a lobular mass at the vagina cuff measuring 7.0 x 3.2 cm proximally 4 cm craniocaudal dimension. The mass compressed the distal aspect of the sigmoid colon just above the rectum there was no mass approximates the posterior wall of the bladder without clear invasion noted evidence of  obstruction of stool proximal to this mass. There was also noted to be a large necrotic lymph node measuring 2.8 cm within the sigmoid mesocolon centrally. There are also small bilateral external iliac lymph nodes. There were no aggressive osseous lesions.   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  #4 patient was begun on palliative chemotherapy starting on 06/03/2013 with Taxol carboplatinum. Total of 6 cycles of treatment as planned.   #5 pain control: She is being managed with fentanyl patches and oxycodone.  CURRENT THERAPY: Taxol/Carbo cycle 5 day 1  INTERVAL HISTORY: Pam Vazquez 67 y.o. female returns for followup visit today.  She just started the Gabapentin on Sunday of this week.  She is taking 100 mg three times a day.  She does not have numbness/tingling in her fingertips, but does experience it in her toes.  She denies motor changes.  Otherwise, a 10 piont ROS is negative.    MEDICAL HISTORY: Past Medical History  Diagnosis Date  . Diabetes mellitus   . PONV (postoperative nausea and vomiting)   . Cancer     endometrial  . Angina     stress test on chart from 3/12/ none since March 2012  . Chronic kidney disease     kidney stones  . Hypertension     / EKG 1/13 Epic    ALLERGIES:  has No Known Allergies.  MEDICATIONS:  Current Outpatient Prescriptions  Medication Sig Dispense Refill  . amLODipine (NORVASC) 2.5 MG tablet Take 2.5 mg by mouth every morning.       . cyanocobalamin 2000 MCG tablet Take 2,000 mcg by mouth  2 (two) times daily.      [name] dexamethasone (DECADRON) 4 MG tablet Take 2 tablets (8 mg total) by mouth 2 (two) times daily with a meal. Take two times a day starting the day after chemotherapy for 3 days.  30 tablet  1  . doxycycline (VIBRA-TABS) 100 MG tablet Take 100 mg by mouth every other day.      . fentaNYL (DURAGESIC - DOSED MCG/HR) 25 MCG/HR patch  Place 1 patch (25 mcg total) onto the skin every 3 (three) days.  10 patch  0  . gabapentin (NEURONTIN) 100 MG capsule Take 1 capsule (100 mg total) by mouth 3 (three) times daily.  90 capsule  0  . lidocaine-prilocaine (EMLA) cream Apply topically as needed.  30 g  6  . lisinopril-hydrochlorothiazide (PRINZIDE,ZESTORETIC) 20-25 MG per tablet Take 1 tablet by mouth daily after breakfast.       . loratadine (CLARITIN) 10 MG tablet Take 10 mg by mouth daily.      [name] LORazepam (ATIVAN) 0.5 MG tablet Take 1 tablet (0.5 mg total) by mouth every 6 (six) hours as needed (Nausea or vomiting).  30 tablet  0  . metFORMIN (GLUCOPHAGE) 500 MG tablet Take 500 mg by mouth 2 (two) times daily.       . metoprolol tartrate (LOPRESSOR) 25 MG tablet Take 25 mg by mouth 2 (two) times daily.       . Multiple Vitamin (MULTIVITAMIN WITH MINERALS) TABS Take 1 tablet by mouth every morning. She takes Surveyor, quantity.      [name] omeprazole (PRILOSEC) 40 MG capsule Take 1 capsule (40 mg total) by mouth daily.  30 capsule  6  . ondansetron (ZOFRAN) 8 MG tablet Take 1 tablet (8 mg total) by mouth 2 (two) times daily. Take two times a day starting the day after chemo for 3 days. Then take two times a day as needed for nausea or vomiting.  30 tablet  1  . diphenhydrAMINE (SOMINEX) 25 MG tablet Take 25 mg by mouth at bedtime as needed for sleep.      . naproxen sodium (ANAPROX) 220 MG tablet Take 220 mg by mouth daily as needed.      . Oxycodone HCl 10 MG TABS Take 1 tablet (10 mg total) by mouth every 4 (four) hours as needed (for moderate to severe pain).  80 tablet  0  . prochlorperazine (COMPAZINE) 10 MG tablet Take 1 tablet (10 mg total) by mouth every 6 (six) hours as needed (Nausea or vomiting).  30 tablet  1  . prochlorperazine (COMPAZINE) 25 MG suppository Place 1 suppository (25 mg total) rectally every 12 (twelve) hours as needed for nausea.  12 suppository  3   No current facility-administered medications for this visit.     SURGICAL HISTORY:  Past Surgical History  Procedure Laterality Date  . Hysteroscopy w/d&c  11/21/2011    Procedure: DILATATION AND CURETTAGE /HYSTEROSCOPY;  Surgeon: Miguel Aschoff;  Location: [facility];  Service: Gynecology;  Laterality: N/A;  . Tubal ligation    . Breast mass removal  1984, 1992    L breast  . Lithotripsy  2000, 2002  . Back surgery      91 and 2001  . Dilation and curettage of uterus    . Node dissection  12/19/2011    Procedure: NODE DISSECTION;  Surgeon: Laurette Schimke, MD PHD;  Location: [facility];  Service: Gynecology;  Laterality: N/A;    REVIEW OF SYSTEMS:  A 10  point review of systems was conducted and is otherwise negative except for what is noted above.     PHYSICAL EXAMINATION: Blood pressure 133/81, pulse 99, temperature 98.2 F (36.8 C), temperature source Oral, resp. rate 18, height 5' (1.524 m), weight 151 lb 3.2 oz (68.584 kg). Body mass index is 29.53 kg/(m^2). General: Patient is a well appearing female in no acute distress HEENT: PERRLA, sclerae anicteric no conjunctival pallor, MMM Neck: supple, no palpable adenopathy Lungs: clear to auscultation bilaterally, no wheezes, rhonchi, or rales Cardiovascular: regular rate rhythm, S1, S2, no murmurs, rubs or gallops Abdomen: Soft, non-tender, non-distended, normoactive bowel sounds, no HSM Extremities: warm and well perfused, no clubbing, cyanosis, or edema Skin: No rashes or lesions Neuro: Non-focal ECOG PERFORMANCE STATUS: 0 - Asymptomatic  LABORATORY DATA: Lab Results  Component Value Date   WBC 10.6* 09/02/2013   HGB 9.5* 09/02/2013   HCT 28.7* 09/02/2013   MCV 103.2* 09/02/2013   PLT 212 09/02/2013      Chemistry      Component Value Date/Time   NA 142 09/02/2013 0948   NA 141 12/20/2011 0334   K 3.9 09/02/2013 0948   K 4.1 12/20/2011 0334   CL 104 04/29/2013 1229   CL 104 12/20/2011 0334   CO2 23 09/02/2013 0948   CO2 27 12/20/2011 0334   BUN 22.1 09/02/2013 0948   BUN 10 12/20/2011 0334    CREATININE 0.9 09/02/2013 0948   CREATININE 0.80 12/20/2011 0334      Component Value Date/Time   CALCIUM 11.1* 09/02/2013 0948   CALCIUM 9.5 12/20/2011 0334   ALKPHOS 65 09/02/2013 0948   ALKPHOS 69 12/15/2011 1125   AST 16 09/02/2013 0948   AST 22 12/15/2011 1125   ALT 14 09/02/2013 0948   ALT 19 12/15/2011 1125   BILITOT 0.48 09/02/2013 0948   BILITOT 0.8 12/15/2011 1125    ADDITIONAL INFORMATION: The malignant cells are positive for estrogen receptor, progesterone receptor, cytokeratin 7 and focally positive for CDX-2. They are negative for cytokeratin 20. Given the clinical history as well as the histologic features, the findings are consistent with metastatic endometrioid adenocarcinoma. (JBK:kh 05-19-13) Pecola Leisure MD Pathologist, Electronic Signature ( Signed 05/19/2013) FINAL DIAGNOSIS Diagnosis Pelvis, biopsy, Central lower - METASTATIC CARCINOMA WITH ASSOCIATED TUMOR NECROSIS. Microscopic Comment Immunostains will be performed and an addendum report will follow. (HCL:kh 05-15-13) Abigail Miyamoto MD Pathologist, Electronic Signature (Case signed 05/15/2013) Specimen Gross and Clinical Information Specimen(s) Obtained: Pelvis, biopsy, Central lower Specimen Clinical Information Recurrent cancer (tl) Gross Received in formalin are fragments and cores of soft white tissue having an aggregate measurement of 1.3 x 0.5 x 0.1 cm. The specimen is submitted in toto. (GRP: ecj 05/14/2013) 1 of 2 Duplicate copy FINAL for Brine, Drusilla P ([id number]) Stain(s) used in Diagnosis: The following stain(s) were used in diagnosing the case: PR - NOACIS, ER - NOACIS, CK-7, CDX-2, CK 20. The control(s) stained appropriately. Disc   RADIOGRAPHIC STUDIES:  Dg Chest 2 View  05/06/2013   *RADIOLOGY REPORT*  Clinical Data: Endometrial cancer  CHEST - 2 VIEW  Comparison: December 15, 2011.  Findings: Cardiomediastinal silhouette appears normal.  No acute pulmonary disease is noted.  Bony thorax is  intact.  Stable small dense nodule seen in right lower lobe compared to prior exam most consistent with granuloma.  IMPRESSION: Stable small nodule seen in right lower lobe most consistent with granuloma.  No acute cardiopulmonary abnormality seen.   Original Report Authenticated By: Lupita Raider.,  M.D.   Ct Abdomen Pelvis W Contrast  05/05/2013   *RADIOLOGY REPORT*  Clinical Data: Pelvic pain.  History of endometrial carcinoma.  CT ABDOMEN AND PELVIS WITH CONTRAST  Technique:  Multidetector CT imaging of the abdomen and pelvis was performed following the standard protocol during bolus administration of intravenous contrast.  Contrast: OMNIPAQUE IOHEXOL 300 MG/ML  SOLN  Comparison: CT 01/05/2006  Findings: Lung bases are clear.  No pericardial fluid.  No focal hepatic lesion.  The gallbladder, pancreas, spleen, adrenal glands, kidneys are normal.  There is a cortical scarring of the right kidney.  No hydronephrosis.  The stomach, small bowel, appendix, cecum are normal.  The colon is normal to the level of the descending colon.  There is a lobular mass at the vaginal cuff measuring 7.0 x 3.2 cm in axial dimension and approximately 4 cm craniocaudad dimension. There is a mass compresses the distal aspect of the sigmoid colon just above the rectum (image 62, axial, image 57, coronal series). No mass approximates the posterior wall the bladder without clear invasion .There is no evidence of obstruction of stool proximal to this mass compressing the sigmoid colon.  There is a large necrotic lymph node measuring 2.8 cm within the sigmoid mesocolon centrally (60).  Patient status post hysterectomy and oophorectomy.  Small bilateral external iliac lymph nodes.  For example 8 mm node on the right and 5 mm node on the left (image 59).  There is no periaortic adenopathy.  No periportal or mesenteric adenopathy  Review of the bone windows demonstrates post lumbar  fusion.  No aggressive osseous lesions.   IMPRESSION:  1.  Lobular mass at the vaginal cuff is consistent with endometrial carcinoma progression.  2.  Mass extends to the serosal surface of the sigmoid colon and compresses the lumen of the sigmoid colon.  No obstruction at this time.  3.  Mass  approximates the posterior wall of the bladder without clear invasion.  4. Large necrotic lymph node within the sigmoid mesocolon superior to the vaginal cuff.  5.  Small bilateral iliac lymph nodes.   Original Report Authenticated By: Genevive Bi, M.D.   Nm Pet Image Restag (ps) Skull Base To Thigh  05/09/2013   *RADIOLOGY REPORT*  Clinical Data: Subsequent treatment strategy for endometrial cancer. Hysterectomy 12/19/2011.  Recurrent pelvic masses on the CT.  NUCLEAR MEDICINE PET SKULL BASE TO THIGH  Fasting Blood Glucose:  141  Technique:  18.3 mCi F-18 FDG was injected intravenously. CT data was obtained and used for attenuation correction and anatomic localization only.  (This was not acquired as a diagnostic CT examination.) Additional exam technical data entered on technologist worksheet.  Comparison:  Abdominal pelvic CT 05/05/2013.  Findings:  Neck: No hypermetabolic lymph nodes in the neck.  Chest:  There are no hypermetabolic mediastinal or hilar lymph nodes.  There is no abnormal metabolic activity within the lungs. CT images demonstrate mild atherosclerosis.  There are no suspicious pulmonary findings.  Abdomen/Pelvis:  There is no abnormal metabolic activity within the liver, adrenal glands, spleen or pancreas.  No hypermetabolic lymph nodes are identified within the abdomen.  However, the large lobulated mass contiguous with the vaginal cuff is hypermetabolic. This demonstrates an SUV max of approximately 24.5.  The mass measures up to 6.8 x 3.6 cm transverse on image 186.  The necrotic nodal mass within the sigmoid the mesocolon is also hypermetabolic. This measures 3.1 cm on image 175 and demonstrates an SUV max of 17.1.  The right external  iliac and pelvic sidewall lymph nodes demonstrated on the recent CT are also hypermetabolic.  The largest node measures 2.0 cm on image 176 and has an SUV max of 17.1.  No hypermetabolic left pelvic side wall nodes are identified.  There is no generalized peritoneal hypermetabolic activity or ascites. Scattered activity in the colon is likely physiologic.  The right kidney demonstrates lobularity and scarring.  Skeleton:  No focal hypermetabolic activity to suggest skeletal metastasis.  IMPRESSION:  1.  Multifocal recurrence within the pelvis involving the vaginal cuff, mesenteric and right pelvic sidewall lymph nodes. 2.  No distant metastases identified.   Original Report Authenticated By: Carey Bullocks, M.D.   Ct Biopsy  05/14/2013   *RADIOLOGY REPORT*  Clinical history:67 year old with history of endometrial cancer and suspicious lesions in the pelvis.  PROCEDURE(S): CT GUIDED BIOPSY OF PELVIC MASS  Physician: Rachelle Hora. Henn, MD  Medications:Versed 2 mg, Fentanyl 100 mcg.  A radiology nurse monitored the patient for moderate sedation.  Moderate sedation time:28 minutes  Procedure:The procedure was explained to the patient.  The risks and benefits of the procedure were discussed and the patient's questions were addressed.  Informed consent was obtained from the patient.  The patient was placed prone on the CT scanner.  Images of the pelvis were obtained.  The left gluteal area was prepped and draped in a sterile fashion.  Skin and soft tissues were anesthetized with lidocaine.  17 gauge needle was directed into the pelvic mass from a left transgluteal approach.  Needle position confirmed along the posterior aspect of the lesion.  Three core biopsies were obtained with an 18 gauge device.  Samples placed in formalin.  17 gauge needle was removed without complication.  Findings:There is a pelvic mass situated adjacent to the vaginal cuff.  The needle was positioned along the posterior aspect of the lesion.   Complications: None  Impression:CT guided core biopsies of the pelvic mass.   Original Report Authenticated By: Richarda Overlie, M.D.    ASSESSMENT: 67 year old female with  #1 recurrent endometrioid adenocarcinoma of the uterus. Patient is receiving palliative chemotherapy consisting of Taxol and carboplatinum.   #2 patient underwent Port-A-Cath placement by interventional radiology and underwent chemotherapy teaching class.   #3 patient was begun on Taxol and carboplatinum starting on 06/03/2013. Today is cycle 5 day 1.  #4 pain control patient is on fentanyl patches as well as oxycodone. Her pain is really well controlled, and she takes the oxycodone very infrequently  #5  patient is recommended to exercise and eat a healthy diet.  PLAN:  #1  Patient is doing well.  I reviewed her labs with her today, she will proceed with chemotherapy.     #2 Patient will continue Gabapentin for her neuropathy.  If she notices any increases in her numbness this week, she was instructed to increase her dose to 200mg  TID.    #3 She will return next week for labs and an evaluation of chemotoxicities.   All questions were answered. The patient knows to call the clinic with any problems, questions or concerns. We can certainly see the patient much sooner if necessary.  I spent 25 minutes counseling the patient face to face. The total time spent in the appointment was 30 minutes.   Illa Level, NP Medical Oncology [facility] [phone]   09/03/2013, 8:36 AM

## 2013-09-03 ENCOUNTER — Ambulatory Visit (HOSPITAL_BASED_OUTPATIENT_CLINIC_OR_DEPARTMENT_OTHER): Payer: Medicare Other

## 2013-09-03 VITALS — BP 126/62 | HR 91 | Temp 97.8°F

## 2013-09-03 DIAGNOSIS — C549 Malignant neoplasm of corpus uteri, unspecified: Secondary | ICD-10-CM

## 2013-09-03 DIAGNOSIS — C541 Malignant neoplasm of endometrium: Secondary | ICD-10-CM

## 2013-09-03 MED ORDER — PEGFILGRASTIM INJECTION 6 MG/0.6ML
6.0000 mg | Freq: Once | SUBCUTANEOUS | Status: AC
  Administered 2013-09-03: 6 mg via SUBCUTANEOUS
  Filled 2013-09-03: qty 0.6

## 2013-09-09 ENCOUNTER — Other Ambulatory Visit (HOSPITAL_BASED_OUTPATIENT_CLINIC_OR_DEPARTMENT_OTHER): Payer: Medicare Other | Admitting: Lab

## 2013-09-09 ENCOUNTER — Ambulatory Visit (HOSPITAL_BASED_OUTPATIENT_CLINIC_OR_DEPARTMENT_OTHER): Payer: Medicare Other | Admitting: Adult Health

## 2013-09-09 ENCOUNTER — Encounter: Payer: Self-pay | Admitting: Adult Health

## 2013-09-09 VITALS — BP 115/73 | HR 56 | Temp 98.1°F | Resp 18 | Ht 61.0 in | Wt 152.8 lb

## 2013-09-09 DIAGNOSIS — C541 Malignant neoplasm of endometrium: Secondary | ICD-10-CM

## 2013-09-09 DIAGNOSIS — C549 Malignant neoplasm of corpus uteri, unspecified: Secondary | ICD-10-CM

## 2013-09-09 DIAGNOSIS — R109 Unspecified abdominal pain: Secondary | ICD-10-CM

## 2013-09-09 LAB — COMPREHENSIVE METABOLIC PANEL (CC13)
ALT: 21 U/L (ref 0–55)
AST: 19 U/L (ref 5–34)
Albumin: 3.5 g/dL (ref 3.5–5.0)
Alkaline Phosphatase: 75 U/L (ref 40–150)
Anion Gap: 9 mEq/L (ref 3–11)
CO2: 29 mEq/L (ref 22–29)
Calcium: 10.1 mg/dL (ref 8.4–10.4)
Creatinine: 1 mg/dL (ref 0.6–1.1)
Potassium: 4.4 mEq/L (ref 3.5–5.1)
Sodium: 140 mEq/L (ref 136–145)
Total Protein: 6.3 g/dL — ABNORMAL LOW (ref 6.4–8.3)

## 2013-09-09 LAB — CBC WITH DIFFERENTIAL/PLATELET
Basophils Absolute: 0 10*3/uL (ref 0.0–0.1)
EOS%: 0.8 % (ref 0.0–7.0)
HGB: 8.4 g/dL — ABNORMAL LOW (ref 11.6–15.9)
LYMPH%: 35.4 % (ref 14.0–49.7)
MCH: 35.4 pg — ABNORMAL HIGH (ref 25.1–34.0)
MCV: 105.1 fL — ABNORMAL HIGH (ref 79.5–101.0)
MONO#: 0.9 10*3/uL (ref 0.1–0.9)
MONO%: 12.6 % (ref 0.0–14.0)
NEUT#: 3.5 10*3/uL (ref 1.5–6.5)
NEUT%: 50.6 % (ref 38.4–76.8)
RDW: 18.7 % — ABNORMAL HIGH (ref 11.2–14.5)

## 2013-09-09 MED ORDER — ONDANSETRON HCL 8 MG PO TABS: 8.0000 mg | ORAL_TABLET | Freq: Two times a day (BID) | ORAL | Status: DC

## 2013-09-09 MED ORDER — FENTANYL 25 MCG/HR TD PT72: 1.0000 | MEDICATED_PATCH | TRANSDERMAL | Status: DC

## 2013-09-09 NOTE — Progress Notes (Signed)
OFFICE PROGRESS NOTE  CC  [name],[name], MD [address] [place] [address] Dr. Marvia Pickles [name] 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 She had undergone a robotic assisted total laparoscopic hysterectomy bilateral salpingo-oophorectomy right pelvic lymph node biopsy on 12/19/2011 without any complications. 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 There was no involvement of other organs lymphovascular invasion was not identified. 3 lymph nodes were negative for metastatic disease. 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 Patient subsequently continued to be seen by gynecologic oncology. In October 2013 patient had a Pap smear that was normal limits there was noted to be some granulation tissue that was removed from the cough. There is no evidence of disease.  #2patient was seen by Dr. Zenda Alpers on 04/29/2013 she had vaginal exam performed that was notable for a nodule in the vagina concerning for recurrence. A rectal examination revealed a firm 8 cm mass also. Because of this she had a biopsy performed. She also had a CT scan of the abdomen and pelvis ordered to further evaluate the sigmoid mass. The pathology came back inconclusive. However the CT of the abdomen and pelvis showed a lobular mass at the vagina cuff measuring 7.0 x 3.2 cm proximally 4 cm craniocaudal dimension. The mass compressed the distal aspect of the sigmoid colon just above the rectum there was no mass approximates the posterior wall of the bladder without clear invasion noted evidence of  obstruction of stool proximal to this mass. There was also noted to be a large necrotic lymph node measuring 2.8 cm within the sigmoid mesocolon centrally. There are also small bilateral external iliac lymph nodes. There were no aggressive osseous lesions.   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  #4 patient was begun on palliative chemotherapy starting on 06/03/2013 with Taxol carboplatinum. Total of 6 cycles of treatment as planned.  #5 pain control: She is being managed with fentanyl patches and oxycodone.  CURRENT THERAPY: Taxol/Carbo cycle 5 day 8  INTERVAL HISTORY: TANGELIA SANSON 67 y.o. female returns for followup following her fifth cycle of Taxol/Carbo.  She is experiencing neuropathy.  She was supposed to increase her Gabapentin last week to 200mg  three times per day, but has not, her numbness is stable.  She denies any motor deficits due to her neuropathy in her fingers.  The numbness is in the tip of the toes and she denies any balance changes.  She denies fevers, chills, nausea, vomiting, constipation, diarrhea.  She has been using Fenantyl patches every 72 hours, however she is replacing her patches every 2.5 days due to increased pain at that time.  Otherwise, a 10 point ROS is neg.   MEDICAL HISTORY: Past Medical History  Diagnosis Date  . Diabetes mellitus   . PONV (postoperative nausea and vomiting)   . Cancer     endometrial  . Angina     stress test on chart from 3/12/ none since March 2012  . Chronic kidney disease     kidney stones  . Hypertension     / EKG  1/13 Epic    ALLERGIES:  has No Known Allergies.  MEDICATIONS:  Current Outpatient Prescriptions  Medication Sig Dispense Refill  . amLODipine (NORVASC) 2.5 MG tablet Take 2.5 mg by mouth every morning.       . cyanocobalamin 2000 MCG tablet Take 2,000 mcg by mouth 2 (two) times daily.      [name] dexamethasone  (DECADRON) 4 MG tablet Take 2 tablets (8 mg total) by mouth 2 (two) times daily with a meal. Take two times a day starting the day after chemotherapy for 3 days.  30 tablet  1  . doxycycline (VIBRA-TABS) 100 MG tablet Take 100 mg by mouth every other day.      . fentaNYL (DURAGESIC - DOSED MCG/HR) 25 MCG/HR patch Place 1 patch (25 mcg total) onto the skin every 3 (three) days.  10 patch  0  . gabapentin (NEURONTIN) 100 MG capsule Take 1 capsule (100 mg total) by mouth 3 (three) times daily.  90 capsule  0  . lidocaine-prilocaine (EMLA) cream Apply topically as needed.  30 g  6  . lisinopril-hydrochlorothiazide (PRINZIDE,ZESTORETIC) 20-25 MG per tablet Take 1 tablet by mouth daily after breakfast.       . loratadine (CLARITIN) 10 MG tablet Take 10 mg by mouth daily.      [name] LORazepam (ATIVAN) 0.5 MG tablet Take 1 tablet (0.5 mg total) by mouth every 6 (six) hours as needed (Nausea or vomiting).  30 tablet  0  . metFORMIN (GLUCOPHAGE) 500 MG tablet Take 500 mg by mouth 2 (two) times daily.       . metoprolol tartrate (LOPRESSOR) 25 MG tablet Take 25 mg by mouth 2 (two) times daily.       . Multiple Vitamin (MULTIVITAMIN WITH MINERALS) TABS Take 1 tablet by mouth every morning. She takes Surveyor, quantity.      . naproxen sodium (ANAPROX) 220 MG tablet Take 220 mg by mouth daily as needed.      [name] omeprazole (PRILOSEC) 40 MG capsule Take 1 capsule (40 mg total) by mouth daily.  30 capsule  6  . ondansetron (ZOFRAN) 8 MG tablet Take 1 tablet (8 mg total) by mouth 2 (two) times daily. Take two times a day starting the day after chemo for 3 days. Then take two times a day as needed for nausea or vomiting.  30 tablet  1  . Oxycodone HCl 10 MG TABS Take 1 tablet (10 mg total) by mouth every 4 (four) hours as needed (for moderate to severe pain).  80 tablet  0  . prochlorperazine (COMPAZINE) 10 MG tablet Take 1 tablet (10 mg total) by mouth every 6 (six) hours as needed (Nausea or vomiting).  30 tablet  1  .  prochlorperazine (COMPAZINE) 25 MG suppository Place 1 suppository (25 mg total) rectally every 12 (twelve) hours as needed for nausea.  12 suppository  3   No current facility-administered medications for this visit.    SURGICAL HISTORY:  Past Surgical History  Procedure Laterality Date  . Hysteroscopy w/d&c  11/21/2011    Procedure: DILATATION AND CURETTAGE /HYSTEROSCOPY;  Surgeon: Miguel Aschoff;  Location: [facility];  Service: Gynecology;  Laterality: N/A;  . Tubal ligation    . Breast mass removal  1984, 1992    L breast  . Lithotripsy  2000, 2002  . Back surgery      91 and 2001  . Dilation and curettage of uterus    . Node dissection  12/19/2011  Procedure: NODE DISSECTION;  Surgeon: Laurette Schimke, MD PHD;  Location: [facility];  Service: Gynecology;  Laterality: N/A;    REVIEW OF SYSTEMS:  A 10 point review of systems was conducted and is otherwise negative except for what is noted above.    PHYSICAL EXAMINATION: Blood pressure 115/73, pulse 56, temperature 98.1 F (36.7 C), temperature source Oral, resp. rate 18, height 5\' 1"  (1.549 m), weight 152 lb 12.8 oz (69.31 kg). Body mass index is 28.89 kg/(m^2). General: Patient is a well appearing female in no acute distress HEENT: PERRLA, sclerae anicteric no conjunctival pallor, MMM Neck: supple, no palpable adenopathy Lungs: clear to auscultation bilaterally, no wheezes, rhonchi, or rales Cardiovascular: regular rate rhythm, S1, S2, no murmurs, rubs or gallops Abdomen: Soft, non-tender, non-distended, normoactive bowel sounds, no HSM Extremities: warm and well perfused, no clubbing, cyanosis, or edema Skin: No rashes or lesions Neuro: Non-focal ECOG PERFORMANCE STATUS: 0 - Asymptomatic  LABORATORY DATA: Lab Results  Component Value Date   WBC 6.8 09/09/2013   HGB 8.4* 09/09/2013   HCT 25.0* 09/09/2013   MCV 105.1* 09/09/2013   PLT 99* 09/09/2013      Chemistry      Component Value Date/Time   NA 140 09/09/2013 0954   NA  141 12/20/2011 0334   K 4.4 09/09/2013 0954   K 4.1 12/20/2011 0334   CL 104 04/29/2013 1229   CL 104 12/20/2011 0334   CO2 29 09/09/2013 0954   CO2 27 12/20/2011 0334   BUN 21.3 09/09/2013 0954   BUN 10 12/20/2011 0334   CREATININE 1.0 09/09/2013 0954   CREATININE 0.80 12/20/2011 0334      Component Value Date/Time   CALCIUM 10.1 09/09/2013 0954   CALCIUM 9.5 12/20/2011 0334   ALKPHOS 75 09/09/2013 0954   ALKPHOS 69 12/15/2011 1125   AST 19 09/09/2013 0954   AST 22 12/15/2011 1125   ALT 21 09/09/2013 0954   ALT 19 12/15/2011 1125   BILITOT 0.61 09/09/2013 0954   BILITOT 0.8 12/15/2011 1125    ADDITIONAL INFORMATION: The malignant cells are positive for estrogen receptor, progesterone receptor, cytokeratin 7 and focally positive for CDX-2. They are negative for cytokeratin 20. Given the clinical history as well as the histologic features, the findings are consistent with metastatic endometrioid adenocarcinoma. (JBK:kh 05-19-13) Pecola Leisure MD Pathologist, Electronic Signature ( Signed 05/19/2013) FINAL DIAGNOSIS Diagnosis Pelvis, biopsy, Central lower - METASTATIC CARCINOMA WITH ASSOCIATED TUMOR NECROSIS. Microscopic Comment Immunostains will be performed and an addendum report will follow. (HCL:kh 05-15-13) Abigail Miyamoto MD Pathologist, Electronic Signature (Case signed 05/15/2013) Specimen Gross and Clinical Information Specimen(s) Obtained: Pelvis, biopsy, Central lower Specimen Clinical Information Recurrent cancer (tl) Gross Received in formalin are fragments and cores of soft white tissue having an aggregate measurement of 1.3 x 0.5 x 0.1 cm. The specimen is submitted in toto. (GRP: ecj 05/14/2013) 1 of 2 Duplicate copy FINAL for Mollenhauer, Siomara P ([id number]) Stain(s) used in Diagnosis: The following stain(s) were used in diagnosing the case: PR - NOACIS, ER - NOACIS, CK-7, CDX-2, CK 20. The control(s) stained appropriately. Disc   RADIOGRAPHIC STUDIES:  Dg Chest 2  View  05/06/2013   *RADIOLOGY REPORT*  Clinical Data: Endometrial cancer  CHEST - 2 VIEW  Comparison: December 15, 2011.  Findings: Cardiomediastinal silhouette appears normal.  No acute pulmonary disease is noted.  Bony thorax is intact.  Stable small dense nodule seen in right lower lobe compared to prior exam most consistent with granuloma.  IMPRESSION: Stable small nodule seen in right lower lobe most consistent with granuloma.  No acute cardiopulmonary abnormality seen.   Original Report Authenticated By: Lupita Raider.,  M.D.   Ct Abdomen Pelvis W Contrast  05/05/2013   *RADIOLOGY REPORT*  Clinical Data: Pelvic pain.  History of endometrial carcinoma.  CT ABDOMEN AND PELVIS WITH CONTRAST  Technique:  Multidetector CT imaging of the abdomen and pelvis was performed following the standard protocol during bolus administration of intravenous contrast.  Contrast: OMNIPAQUE IOHEXOL 300 MG/ML  SOLN  Comparison: CT 01/05/2006  Findings: Lung bases are clear.  No pericardial fluid.  No focal hepatic lesion.  The gallbladder, pancreas, spleen, adrenal glands, kidneys are normal.  There is a cortical scarring of the right kidney.  No hydronephrosis.  The stomach, small bowel, appendix, cecum are normal.  The colon is normal to the level of the descending colon.  There is a lobular mass at the vaginal cuff measuring 7.0 x 3.2 cm in axial dimension and approximately 4 cm craniocaudad dimension. There is a mass compresses the distal aspect of the sigmoid colon just above the rectum (image 62, axial, image 57, coronal series). No mass approximates the posterior wall the bladder without clear invasion .There is no evidence of obstruction of stool proximal to this mass compressing the sigmoid colon.  There is a large necrotic lymph node measuring 2.8 cm within the sigmoid mesocolon centrally (60).  Patient status post hysterectomy and oophorectomy.  Small bilateral external iliac lymph nodes.  For example 8 mm node  on the right and 5 mm node on the left (image 59).  There is no periaortic adenopathy.  No periportal or mesenteric adenopathy  Review of the bone windows demonstrates post lumbar  fusion.  No aggressive osseous lesions.  IMPRESSION:  1.  Lobular mass at the vaginal cuff is consistent with endometrial carcinoma progression.  2.  Mass extends to the serosal surface of the sigmoid colon and compresses the lumen of the sigmoid colon.  No obstruction at this time.  3.  Mass  approximates the posterior wall of the bladder without clear invasion.  4. Large necrotic lymph node within the sigmoid mesocolon superior to the vaginal cuff.  5.  Small bilateral iliac lymph nodes.   Original Report Authenticated By: Genevive Bi, M.D.   Nm Pet Image Restag (ps) Skull Base To Thigh  05/09/2013   *RADIOLOGY REPORT*  Clinical Data: Subsequent treatment strategy for endometrial cancer. Hysterectomy 12/19/2011.  Recurrent pelvic masses on the CT.  NUCLEAR MEDICINE PET SKULL BASE TO THIGH  Fasting Blood Glucose:  141  Technique:  18.3 mCi F-18 FDG was injected intravenously. CT data was obtained and used for attenuation correction and anatomic localization only.  (This was not acquired as a diagnostic CT examination.) Additional exam technical data entered on technologist worksheet.  Comparison:  Abdominal pelvic CT 05/05/2013.  Findings:  Neck: No hypermetabolic lymph nodes in the neck.  Chest:  There are no hypermetabolic mediastinal or hilar lymph nodes.  There is no abnormal metabolic activity within the lungs. CT images demonstrate mild atherosclerosis.  There are no suspicious pulmonary findings.  Abdomen/Pelvis:  There is no abnormal metabolic activity within the liver, adrenal glands, spleen or pancreas.  No hypermetabolic lymph nodes are identified within the abdomen.  However, the large lobulated mass contiguous with the vaginal cuff is hypermetabolic. This demonstrates an SUV max of approximately 24.5.  The mass  measures up to 6.8 x 3.6 cm transverse  on image 186.  The necrotic nodal mass within the sigmoid the mesocolon is also hypermetabolic. This measures 3.1 cm on image 175 and demonstrates an SUV max of 17.1.  The right external iliac and pelvic sidewall lymph nodes demonstrated on the recent CT are also hypermetabolic.  The largest node measures 2.0 cm on image 176 and has an SUV max of 17.1.  No hypermetabolic left pelvic side wall nodes are identified.  There is no generalized peritoneal hypermetabolic activity or ascites. Scattered activity in the colon is likely physiologic.  The right kidney demonstrates lobularity and scarring.  Skeleton:  No focal hypermetabolic activity to suggest skeletal metastasis.  IMPRESSION:  1.  Multifocal recurrence within the pelvis involving the vaginal cuff, mesenteric and right pelvic sidewall lymph nodes. 2.  No distant metastases identified.   Original Report Authenticated By: Carey Bullocks, M.D.   Ct Biopsy  05/14/2013   *RADIOLOGY REPORT*  Clinical history:67 year old with history of endometrial cancer and suspicious lesions in the pelvis.  PROCEDURE(S): CT GUIDED BIOPSY OF PELVIC MASS  Physician: Rachelle Hora. Henn, MD  Medications:Versed 2 mg, Fentanyl 100 mcg.  A radiology nurse monitored the patient for moderate sedation.  Moderate sedation time:28 minutes  Procedure:The procedure was explained to the patient.  The risks and benefits of the procedure were discussed and the patient's questions were addressed.  Informed consent was obtained from the patient.  The patient was placed prone on the CT scanner.  Images of the pelvis were obtained.  The left gluteal area was prepped and draped in a sterile fashion.  Skin and soft tissues were anesthetized with lidocaine.  17 gauge needle was directed into the pelvic mass from a left transgluteal approach.  Needle position confirmed along the posterior aspect of the lesion.  Three core biopsies were obtained with an 18 gauge device.   Samples placed in formalin.  17 gauge needle was removed without complication.  Findings:There is a pelvic mass situated adjacent to the vaginal cuff.  The needle was positioned along the posterior aspect of the lesion.  Complications: None  Impression:CT guided core biopsies of the pelvic mass.   Original Report Authenticated By: Richarda Overlie, M.D.    ASSESSMENT: 67 year old female with  #1 recurrent endometrioid adenocarcinoma of the uterus. Patient is receiving palliative chemotherapy consisting of Taxol and carboplatinum.   #2 patient underwent Port-A-Cath placement by interventional radiology and underwent chemotherapy teaching class.   #3 patient was begun on Taxol and carboplatinum starting on 06/03/2013. Today is cycle 5 day 8.  #4 pain control patient is on fentanyl patches as well as oxycodone. Her pain is really well controlled, and she takes the oxycodone very infrequently  #5  patient is recommended to exercise and eat a healthy diet.  PLAN:  #1  Patient is doing well.  Her labs are stable, I reviewed them with her in detail.  She will f/u with Dr. Nelly Rout on 11/4.  A ca-125 is pending today.    #2 I refilled her fentanyl patches, and ondansetron today.    #3 She will return next week for labs, an appointment, and cycle 6 of treatment on 09/23/13.    #4 I recommended she increase the Gabapentin to 200mg  TID x 3 days, then 300mg  TID.   All questions were answered. The patient knows to call the clinic with any problems, questions or concerns. We can certainly see the patient much sooner if necessary.  I spent 25 minutes counseling the patient face to face.  The total time spent in the appointment was 30 minutes.   Illa Level, NP Medical Oncology [facility] [phone] 09/09/2013, 11:42 AM

## 2013-09-09 NOTE — Patient Instructions (Signed)
Take Gabapentin 2 tablets (200mg ) three times per day for three days then increase to Gabapentin 3 tablets (300mg ) three times per day.    We will see you back in 2 weeks.  Please call us if you have any questions or concerns.

## 2013-09-16 ENCOUNTER — Ambulatory Visit: Payer: Medicare Other | Attending: Gynecologic Oncology | Admitting: Gynecologic Oncology

## 2013-09-16 ENCOUNTER — Encounter: Payer: Self-pay | Admitting: Gynecologic Oncology

## 2013-09-16 ENCOUNTER — Other Ambulatory Visit: Payer: Medicare Other | Admitting: Lab

## 2013-09-16 VITALS — BP 119/64 | HR 90 | Temp 98.4°F | Resp 16 | Ht 60.0 in | Wt 154.4 lb

## 2013-09-16 DIAGNOSIS — Z9079 Acquired absence of other genital organ(s): Secondary | ICD-10-CM | POA: Insufficient documentation

## 2013-09-16 DIAGNOSIS — C549 Malignant neoplasm of corpus uteri, unspecified: Secondary | ICD-10-CM | POA: Insufficient documentation

## 2013-09-16 DIAGNOSIS — L918 Other hypertrophic disorders of the skin: Secondary | ICD-10-CM | POA: Insufficient documentation

## 2013-09-16 DIAGNOSIS — Z90710 Acquired absence of both cervix and uterus: Secondary | ICD-10-CM | POA: Insufficient documentation

## 2013-09-16 DIAGNOSIS — C541 Malignant neoplasm of endometrium: Secondary | ICD-10-CM

## 2013-09-16 NOTE — Patient Instructions (Addendum)
Followup with Gyn Onc as guided by Dr.Khan   Thank you very much Ms. Pam Vazquez for allowing me to provide care for you today.  I appreciate your confidence in choosing our Gynecologic Oncology team.  If you have any questions about your visit today please call our office and we will get back to you as soon as possible.  [name]. [name] MD., PhD Gynecologic Oncology

## 2013-09-16 NOTE — Progress Notes (Signed)
Office Visit:  GYN ONCOLOGY   Chief Complaint:  Recurrent endometrial cancer  Assessment:   62) 67 y.o. 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  Her postop course has been complicated by persistent granulation tissue.   04/2013 vaginal examination is notable for a nodule in the vagina this was biopsied. A rectal examination firm 8 cm mass was noted.  Biopsy c/w recurrent endometroid cancer.  Ms Dant has received 5/6 cycles taxol/carbo.  Repeat CT 4 weeks after cycle 4 F/U after chemo as recommended by Dr. Welton Flakes.    HPI:  Pam Vazquez is a 67 y.o. year old No obstetric history on file. initially seen in consultation on 11/27/2011 at the request of Dr. Duane Lope for evaluation of a grade 1 endometrial adenocarcinoma..  She then underwent a robotic-assisted total laparoscopic hysterectomy bilateral salpingo-oophorectomy right pelvic lymph node biopsy on 12/19/2011 without complications.  Her final pathologic diagnosis is a Stage Ia Grade 1 endometrioid endometrial cancer with no evidence of lymphovascular space invasion, 5/16 mm (31 %) of myometrial invasion and negative lymph nodes.  Pam Vazquez   presented to Dr. Lodema Hong in October of 2013. A Pap test at that time was within normal limits and granulation tissue was removed from the cuff.     Subsequent visits to Dr. Greta Doom in November December and January were all notable for the application of silver nitrate for management of the persistent vaginal cuff granulation tissue.  At a visit 04/29/2013 granulation tissue was noted and a pelic mass was appreciated and biopsied.   Diagnosis Vagina, biopsy, apex - SLIGHTLY INFLAMED SQUAMOUS EPITHELIUM. - NO EVIDENCE OF DYSPLASIA OR MALIGNANCY.  CT  05/05/2013 1. Lobular mass at the vaginal cuff is consistent with endometrial carcinoma progression. 2. Mass extends to the serosal surface of the sigmoid colon and compresses the lumen of the sigmoid  colon. No obstruction at this time.  3. Mass approximates the posterior wall of the bladder without clear invasion. 4. Large necrotic lymph node within the sigmoid mesocolon superior to the vaginal cuff. 5. Small bilateral iliac lymph nodes  CT guided bx 05/2013 Pelvis, biopsy, Central lower - METASTATIC CARCINOMA WITH ASSOCIATED TUMOR NECROSIS.   CT 08/06/2013 1. Interval response to therapy of recurrent disease at the apex of the vaginal cuff, adjacent sigmoid serosa, and sigmoid mesocolon. 2. Similar to minimal improvement in right pelvic sidewall nodal metastasis. 3. No areas of progressive disease.  Pam Vazquez has received 5/6 cycles taxol/Carboplatin.  Feels well.  Denies vaginal bleeding.  Past Medical History  Diagnosis Date  . Diabetes mellitus   . PONV (postoperative nausea and vomiting)   . Cancer     endometrial  . Angina     stress test on chart from 3/12/ none since March 2012  . Chronic kidney disease     kidney stones  . Hypertension     / EKG 1/13 Epic   Past Surgical History  Procedure Laterality Date  . Hysteroscopy w/d&c  11/21/2011    Procedure: DILATATION AND CURETTAGE /HYSTEROSCOPY;  Surgeon: Miguel Aschoff;  Location: [facility];  Service: Gynecology;  Laterality: N/A;  . Tubal ligation    . Breast mass removal  1984, 1992    L breast  . Lithotripsy  2000, 2002  . Back surgery      91 and 2001  . Dilation and curettage of uterus    . Node dissection  12/19/2011  Procedure: NODE DISSECTION;  Surgeon: Laurette Schimke, MD PHD;  Location: [facility];  Service: Gynecology;  Laterality: N/A;   Social Hx.    her husband died earlier this year  Review of systems: Constitutional:  She has no weight gain or weight loss. She has no fever or chills. Eyes: No blurred vision Ears, Nose, Mouth, Throat: No dizziness, headaches or changes in hearing. No mouth sores. Cardiovascular: No chest pain, palpitations or edema. Respiratory:  No shortness of breath, wheezing or  cough Gastrointestinal: She has normal bowel movements without diarrhea or constipation. She denies any nausea or vomiting. She denies blood in her stool or heart burn.  Genitourinary:  She denies pelvic pain, pelvic pressure or changes in her urinary function. She has no hematuria, dysuria, or incontinence. She has no vaginal bleeding,  Musculoskeletal: Denies muscle weakness or joint pains.  Skin:  She has no skin changes, rashes or itching Neurological:  Denies dizziness or headaches. No neuropathy, no numbness or tingling. Psychiatric:  She denies depression or anxiety. Endocrine:  No DM or thyroid disease Hematologic/Lymphatic:   No easy bruising or bleeding   Physical Exam: BP 119/64  Pulse 90  Temp(Src) 98.4 F (36.9 C) (Oral)  Resp 16  Ht 5' (1.524 m)  Wt 154 lb 6.4 oz (70.035 kg)  BMI 30.15 kg/m2 General: Well dressed, well nourished in no apparent distress.   HEENT:  Normocephalic and atraumatic, no lesions.  Extraocular muscles intact. Sclerae anicteric. Pupils equal, round, reactive. No mouth sores or ulcers. Thyroid is normal size, not nodular, midline. Skin:  No lesions or rashes. Breasts:  Soft, symmetric.  No skin or nipple changes.  No palpable LN or masses. Lungs:  Clear to auscultation bilaterally.  No wheezes. Cardiovascular:  Regular rate and rhythm.  No murmurs or rubs. Abdomen:  Soft, nontender, nondistended.  No palpable masses.  No hepatosplenomegaly.  No ascites. Normal bowel sounds.  No hernias or tenderness at the laparoscopic trocar sites. Genitourinary: Normal EGBUS  Vaginal cuff intact.  No bleeding or discharge.  No cul de sac fullness.  No discrete masses, firmness at the cuff.   Rectal: Good tone, no masses. Extremities: No cyanosis, clubbing or edema.  No calf tenderness or erythema. No palpable cords. Lymphatics:  No palpable supraclavicular, cervical, axillary or inguinal lymphadenopathy. Psychiatric: Mood and affect are appropriate. Neurological:  Awake, alert and oriented x 3. Sensation is intact, no neuropathy.  Musculoskeletal: No pain, normal strength and range of motion.

## 2013-09-17 ENCOUNTER — Other Ambulatory Visit: Payer: Self-pay | Admitting: *Deleted

## 2013-09-17 ENCOUNTER — Ambulatory Visit: Payer: Medicare Other

## 2013-09-17 DIAGNOSIS — C549 Malignant neoplasm of corpus uteri, unspecified: Secondary | ICD-10-CM

## 2013-09-17 MED ORDER — GABAPENTIN 300 MG PO CAPS: 300.0000 mg | ORAL_CAPSULE | Freq: Three times a day (TID) | ORAL | Status: DC

## 2013-09-18 ENCOUNTER — Other Ambulatory Visit: Payer: Self-pay

## 2013-09-19 ENCOUNTER — Other Ambulatory Visit: Payer: Self-pay | Admitting: Emergency Medicine

## 2013-09-19 MED ORDER — GABAPENTIN 100 MG PO CAPS: 200.0000 mg | ORAL_CAPSULE | Freq: Three times a day (TID) | ORAL | Status: DC

## 2013-09-23 ENCOUNTER — Other Ambulatory Visit (HOSPITAL_BASED_OUTPATIENT_CLINIC_OR_DEPARTMENT_OTHER): Payer: Medicare Other | Admitting: Lab

## 2013-09-23 ENCOUNTER — Ambulatory Visit (HOSPITAL_BASED_OUTPATIENT_CLINIC_OR_DEPARTMENT_OTHER): Payer: Medicare Other

## 2013-09-23 ENCOUNTER — Ambulatory Visit (HOSPITAL_BASED_OUTPATIENT_CLINIC_OR_DEPARTMENT_OTHER): Payer: Medicare Other | Admitting: Oncology

## 2013-09-23 ENCOUNTER — Ambulatory Visit: Payer: Medicare Other | Admitting: Nutrition

## 2013-09-23 ENCOUNTER — Encounter: Payer: Self-pay | Admitting: Oncology

## 2013-09-23 VITALS — BP 150/81 | HR 91 | Temp 98.4°F | Resp 18 | Ht 60.0 in | Wt 152.6 lb

## 2013-09-23 DIAGNOSIS — C549 Malignant neoplasm of corpus uteri, unspecified: Secondary | ICD-10-CM

## 2013-09-23 DIAGNOSIS — Z5111 Encounter for antineoplastic chemotherapy: Secondary | ICD-10-CM

## 2013-09-23 DIAGNOSIS — C541 Malignant neoplasm of endometrium: Secondary | ICD-10-CM

## 2013-09-23 DIAGNOSIS — G609 Hereditary and idiopathic neuropathy, unspecified: Secondary | ICD-10-CM

## 2013-09-23 LAB — CBC WITH DIFFERENTIAL/PLATELET
BASO%: 0 % (ref 0.0–2.0)
Eosinophils Absolute: 0 10*3/uL (ref 0.0–0.5)
LYMPH%: 8.8 % — ABNORMAL LOW (ref 14.0–49.7)
MCHC: 33 g/dL (ref 31.5–36.0)
MCV: 108.5 fL — ABNORMAL HIGH (ref 79.5–101.0)
MONO%: 0.3 % (ref 0.0–14.0)
Platelets: 108 10*3/uL — ABNORMAL LOW (ref 145–400)
RBC: 2.46 10*6/uL — ABNORMAL LOW (ref 3.70–5.45)
RDW: 17.3 % — ABNORMAL HIGH (ref 11.2–14.5)
nRBC: 0 % (ref 0–0)

## 2013-09-23 LAB — COMPREHENSIVE METABOLIC PANEL (CC13)
ALT: 21 U/L (ref 0–55)
Albumin: 4.2 g/dL (ref 3.5–5.0)
Anion Gap: 12 mEq/L — ABNORMAL HIGH (ref 3–11)
CO2: 23 mEq/L (ref 22–29)
Calcium: 10.9 mg/dL — ABNORMAL HIGH (ref 8.4–10.4)
Chloride: 107 mEq/L (ref 98–109)
Creatinine: 0.8 mg/dL (ref 0.6–1.1)
Total Bilirubin: 0.62 mg/dL (ref 0.20–1.20)

## 2013-09-23 MED ORDER — PACLITAXEL CHEMO INJECTION 300 MG/50ML
175.0000 mg/m2 | Freq: Once | INTRAVENOUS | Status: AC
  Administered 2013-09-23: 306 mg via INTRAVENOUS
  Filled 2013-09-23: qty 51

## 2013-09-23 MED ORDER — FAMOTIDINE IN NACL 20-0.9 MG/50ML-% IV SOLN
20.0000 mg | Freq: Once | INTRAVENOUS | Status: AC
  Administered 2013-09-23: 20 mg via INTRAVENOUS

## 2013-09-23 MED ORDER — SODIUM CHLORIDE 0.9 % IJ SOLN
10.0000 mL | INTRAMUSCULAR | Status: DC | PRN
  Administered 2013-09-23: 10 mL
  Filled 2013-09-23: qty 10

## 2013-09-23 MED ORDER — HEPARIN SOD (PORK) LOCK FLUSH 100 UNIT/ML IV SOLN
500.0000 [IU] | Freq: Once | INTRAVENOUS | Status: AC | PRN
  Administered 2013-09-23: 500 [IU]
  Filled 2013-09-23: qty 5

## 2013-09-23 MED ORDER — ONDANSETRON 16 MG/50ML IVPB (CHCC)
16.0000 mg | Freq: Once | INTRAVENOUS | Status: AC
  Administered 2013-09-23: 16 mg via INTRAVENOUS

## 2013-09-23 MED ORDER — DIPHENHYDRAMINE HCL 50 MG/ML IJ SOLN
25.0000 mg | Freq: Once | INTRAMUSCULAR | Status: AC
  Administered 2013-09-23: 12:00:00 via INTRAVENOUS

## 2013-09-23 MED ORDER — DEXAMETHASONE SODIUM PHOSPHATE 20 MG/5ML IJ SOLN
INTRAMUSCULAR | Status: AC
  Filled 2013-09-23: qty 5

## 2013-09-23 MED ORDER — DEXAMETHASONE SODIUM PHOSPHATE 20 MG/5ML IJ SOLN
20.0000 mg | Freq: Once | INTRAMUSCULAR | Status: AC
  Administered 2013-09-23: 20 mg via INTRAVENOUS

## 2013-09-23 MED ORDER — ONDANSETRON 16 MG/50ML IVPB (CHCC)
INTRAVENOUS | Status: AC
  Filled 2013-09-23: qty 16

## 2013-09-23 MED ORDER — DIPHENHYDRAMINE HCL 25 MG PO CAPS
ORAL_CAPSULE | ORAL | Status: AC
  Filled 2013-09-23: qty 1

## 2013-09-23 MED ORDER — SODIUM CHLORIDE 0.9 % IV SOLN
525.6000 mg | Freq: Once | INTRAVENOUS | Status: AC
  Administered 2013-09-23: 530 mg via INTRAVENOUS
  Filled 2013-09-23: qty 53

## 2013-09-23 MED ORDER — FAMOTIDINE IN NACL 20-0.9 MG/50ML-% IV SOLN
INTRAVENOUS | Status: AC
  Filled 2013-09-23: qty 50

## 2013-09-23 MED ORDER — DIPHENHYDRAMINE HCL 50 MG/ML IJ SOLN
INTRAMUSCULAR | Status: AC
  Filled 2013-09-23: qty 1

## 2013-09-23 MED ORDER — SODIUM CHLORIDE 0.9 % IV SOLN
Freq: Once | INTRAVENOUS | Status: AC
  Administered 2013-09-23: 12:00:00 via INTRAVENOUS

## 2013-09-23 NOTE — Progress Notes (Signed)
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  Patient continues to eat well.  Her weight is stable and was documented as 152.6 pounds November 11.  She denies nutritional side effects.  Nutrition diagnosis of unintended weight loss resolved.  Patient encouraged to continue healthy diet.  She will continue to strive for weight maintenance during chemotherapy.  She will contact me if she has further questions or concerns.  There is no followup scheduled.

## 2013-09-23 NOTE — Patient Instructions (Signed)
[facility] Discharge Instructions for Patients Receiving Chemotherapy  Today you received the following chemotherapy agents taxol, carboplatin  To help prevent nausea and vomiting after your treatment, we encourage you to take your nausea medication as directed by Dr Welton Flakes     If you develop nausea and vomiting that is not controlled by your nausea medication, call the clinic.   BELOW ARE SYMPTOMS THAT SHOULD BE REPORTED IMMEDIATELY:  *FEVER GREATER THAN 100.5 F  *CHILLS WITH OR WITHOUT FEVER  NAUSEA AND VOMITING THAT IS NOT CONTROLLED WITH YOUR NAUSEA MEDICATION  *UNUSUAL SHORTNESS OF BREATH  *UNUSUAL BRUISING OR BLEEDING  TENDERNESS IN MOUTH AND THROAT WITH OR WITHOUT PRESENCE OF ULCERS  *URINARY PROBLEMS  *BOWEL PROBLEMS  UNUSUAL RASH Items with * indicate a potential emergency and should be followed up as soon as possible.  Feel free to call the clinic you have any questions or concerns. The clinic phone number is [phone].

## 2013-09-23 NOTE — Progress Notes (Signed)
OFFICE PROGRESS NOTE  CC  [name],[name], MD [address] [place] [address] Dr. Marvia Pickles [name] 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 She had undergone a robotic assisted total laparoscopic hysterectomy bilateral salpingo-oophorectomy right pelvic lymph node biopsy on 12/19/2011 without any complications. 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 There was no involvement of other organs lymphovascular invasion was not identified. 3 lymph nodes were negative for metastatic disease. 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 Patient subsequently continued to be seen by gynecologic oncology. In October 2013 patient had a Pap smear that was normal limits there was noted to be some granulation tissue that was removed from the cough. There is no evidence of disease.  #2patient was seen by Dr. Zenda Alpers on 04/29/2013 she had vaginal exam performed that was notable for a nodule in the vagina concerning for recurrence. A rectal examination revealed a firm 8 cm mass also. Because of this she had a biopsy performed. She also had a CT scan of the abdomen and pelvis ordered to further evaluate the sigmoid mass. The pathology came back inconclusive. However the CT of the abdomen and pelvis showed a lobular mass at the vagina cuff measuring 7.0 x 3.2 cm proximally 4 cm craniocaudal dimension. The mass compressed the distal aspect of the sigmoid colon just above the rectum there was no mass approximates the posterior wall of the bladder without clear invasion noted evidence of  obstruction of stool proximal to this mass. There was also noted to be a large necrotic lymph node measuring 2.8 cm within the sigmoid mesocolon centrally. There are also small bilateral external iliac lymph nodes. There were no aggressive osseous lesions.   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  #4 patient was begun on palliative chemotherapy starting on 06/03/2013 with Taxol carboplatinum. Total of 6 cycles of treatment as planned.  #5 pain control: She is being managed with fentanyl patches and oxycodone.  CURRENT THERAPY: Taxol/Carbo cycle 6 day 1  INTERVAL HISTORY: CAYLYNN MINCHEW 67 y.o. female returns for followup prior to cycle 6 of her Taxol carboplatinum. Her platelets are 108,000 but she has no evidence of bleeding. She is also anemic deep to the chemotherapy but she denies having any shortness of breath chest pains or palpitations. She does experience fatigue. Patient is denying any nausea vomiting abdominal pain no fevers chills or night sweats. She does have ongoing peripheral neuropathy. I have recommended that she take gabapentin 200 mg 4 times a day. Hopefully this will help to reduce the neuropathic pain. We discussed the possibility of continuing chemotherapy for another few cycles. Certainly we may have to change Taxol to a different agent and possibly Gemzar or Taxotere. We discussed side effects of this. Remainder of the 10 point review of systems is negative.  MEDICAL HISTORY: Past Medical History  Diagnosis Date  . Diabetes mellitus   . PONV (postoperative nausea and vomiting)   . Cancer     endometrial  . Angina     stress test on chart from 3/12/ none since March  2012  . Chronic kidney disease     kidney stones  . Hypertension     / EKG 1/13 Epic    ALLERGIES:  has No Known Allergies.  MEDICATIONS:  Current Outpatient Prescriptions  Medication Sig Dispense  Refill  . amLODipine (NORVASC) 2.5 MG tablet Take 2.5 mg by mouth every morning.       . cyanocobalamin 2000 MCG tablet Take 2,000 mcg by mouth 2 (two) times daily.      [name] dexamethasone (DECADRON) 4 MG tablet Take 2 tablets (8 mg total) by mouth 2 (two) times daily with a meal. Take two times a day starting the day after chemotherapy for 3 days.  30 tablet  1  . doxycycline (VIBRA-TABS) 100 MG tablet Take 100 mg by mouth every other day.      . fentaNYL (DURAGESIC - DOSED MCG/HR) 25 MCG/HR patch Place 1 patch (25 mcg total) onto the skin every 3 (three) days.  10 patch  0  . gabapentin (NEURONTIN) 100 MG capsule Take 2 capsules (200 mg total) by mouth 3 (three) times daily.  180 capsule  1  . lidocaine-prilocaine (EMLA) cream Apply topically as needed.  30 g  6  . lisinopril-hydrochlorothiazide (PRINZIDE,ZESTORETIC) 20-25 MG per tablet Take 1 tablet by mouth daily after breakfast.       . loratadine (CLARITIN) 10 MG tablet Take 10 mg by mouth daily.      [name] LORazepam (ATIVAN) 0.5 MG tablet Take 1 tablet (0.5 mg total) by mouth every 6 (six) hours as needed (Nausea or vomiting).  30 tablet  0  . metFORMIN (GLUCOPHAGE) 500 MG tablet Take 500 mg by mouth 2 (two) times daily.       . metoprolol tartrate (LOPRESSOR) 25 MG tablet Take 25 mg by mouth 2 (two) times daily.       . Multiple Vitamin (MULTIVITAMIN WITH MINERALS) TABS Take 1 tablet by mouth every morning. She takes Surveyor, quantity.      . ondansetron (ZOFRAN) 8 MG tablet Take 1 tablet (8 mg total) by mouth 2 (two) times daily. Take two times a day starting the day after chemo for 3 days. Then take two times a day as needed for nausea or vomiting.  30 tablet  1  . omeprazole (PRILOSEC) 40 MG capsule Take 1 capsule (40 mg total) by mouth daily.  30 capsule  6  . prochlorperazine (COMPAZINE) 10 MG tablet Take 1 tablet (10 mg total) by mouth every 6 (six) hours as needed (Nausea or vomiting).  30 tablet  1  . prochlorperazine (COMPAZINE) 25 MG  suppository Place 1 suppository (25 mg total) rectally every 12 (twelve) hours as needed for nausea.  12 suppository  3   No current facility-administered medications for this visit.    SURGICAL HISTORY:  Past Surgical History  Procedure Laterality Date  . Hysteroscopy w/d&c  11/21/2011    Procedure: DILATATION AND CURETTAGE /HYSTEROSCOPY;  Surgeon: Miguel Aschoff;  Location: [facility];  Service: Gynecology;  Laterality: N/A;  . Tubal ligation    . Breast mass removal  1984, 1992    L breast  . Lithotripsy  2000, 2002  . Back surgery      91 and 2001  . Dilation and curettage of uterus    . Node dissection  12/19/2011    Procedure: NODE DISSECTION;  Surgeon: Laurette Schimke, MD PHD;  Location: [facility];  Service: Gynecology;  Laterality: N/A;    REVIEW OF SYSTEMS:  A 10  point review of systems was conducted and is otherwise negative except for what is noted above.    PHYSICAL EXAMINATION: Blood pressure 150/81, pulse 91, temperature 98.4 F (36.9 C), temperature source Oral, resp. rate 18, height 5' (1.524 m), weight 152 lb 9.6 oz (69.219 kg). Body mass index is 29.8 kg/(m^2). General: Patient is a well appearing female in no acute distress HEENT: PERRLA, sclerae anicteric no conjunctival pallor, MMM Neck: supple, no palpable adenopathy Lungs: clear to auscultation bilaterally, no wheezes, rhonchi, or rales Cardiovascular: regular rate rhythm, S1, S2, no murmurs, rubs or gallops Abdomen: Soft, non-tender, non-distended, normoactive bowel sounds, no HSM Extremities: warm and well perfused, no clubbing, cyanosis, or edema Skin: No rashes or lesions Neuro: Non-focal ECOG PERFORMANCE STATUS: 0 - Asymptomatic  LABORATORY DATA: Lab Results  Component Value Date   WBC 5.9 09/23/2013   HGB 8.8* 09/23/2013   HCT 26.7* 09/23/2013   MCV 108.5* 09/23/2013   PLT 108* 09/23/2013      Chemistry      Component Value Date/Time   NA 140 09/09/2013 0954   NA 141 12/20/2011 0334   K 4.4 09/09/2013  0954   K 4.1 12/20/2011 0334   CL 104 04/29/2013 1229   CL 104 12/20/2011 0334   CO2 29 09/09/2013 0954   CO2 27 12/20/2011 0334   BUN 21.3 09/09/2013 0954   BUN 10 12/20/2011 0334   CREATININE 1.0 09/09/2013 0954   CREATININE 0.80 12/20/2011 0334      Component Value Date/Time   CALCIUM 10.1 09/09/2013 0954   CALCIUM 9.5 12/20/2011 0334   ALKPHOS 75 09/09/2013 0954   ALKPHOS 69 12/15/2011 1125   AST 19 09/09/2013 0954   AST 22 12/15/2011 1125   ALT 21 09/09/2013 0954   ALT 19 12/15/2011 1125   BILITOT 0.61 09/09/2013 0954   BILITOT 0.8 12/15/2011 1125    ADDITIONAL INFORMATION: The malignant cells are positive for estrogen receptor, progesterone receptor, cytokeratin 7 and focally positive for CDX-2. They are negative for cytokeratin 20. Given the clinical history as well as the histologic features, the findings are consistent with metastatic endometrioid adenocarcinoma. (JBK:kh 05-19-13) Pecola Leisure MD Pathologist, Electronic Signature ( Signed 05/19/2013) FINAL DIAGNOSIS Diagnosis Pelvis, biopsy, Central lower - METASTATIC CARCINOMA WITH ASSOCIATED TUMOR NECROSIS. Microscopic Comment Immunostains will be performed and an addendum report will follow. (HCL:kh 05-15-13) Abigail Miyamoto MD Pathologist, Electronic Signature (Case signed 05/15/2013) Specimen Gross and Clinical Information Specimen(s) Obtained: Pelvis, biopsy, Central lower Specimen Clinical Information Recurrent cancer (tl) Gross Received in formalin are fragments and cores of soft white tissue having an aggregate measurement of 1.3 x 0.5 x 0.1 cm. The specimen is submitted in toto. (GRP: ecj 05/14/2013) 1 of 2 Duplicate copy FINAL for Birchler, Jayln P ([id number]) Stain(s) used in Diagnosis: The following stain(s) were used in diagnosing the case: PR - NOACIS, ER - NOACIS, CK-7, CDX-2, CK 20. The control(s) stained appropriately. Disc   RADIOGRAPHIC STUDIES:  Dg Chest 2 View  05/06/2013   *RADIOLOGY REPORT*  Clinical  Data: Endometrial cancer  CHEST - 2 VIEW  Comparison: December 15, 2011.  Findings: Cardiomediastinal silhouette appears normal.  No acute pulmonary disease is noted.  Bony thorax is intact.  Stable small dense nodule seen in right lower lobe compared to prior exam most consistent with granuloma.  IMPRESSION: Stable small nodule seen in right lower lobe most consistent with granuloma.  No acute cardiopulmonary abnormality seen.   Original Report Authenticated By: Lupita Raider.,  M.D.   Ct Abdomen Pelvis W Contrast  05/05/2013   *RADIOLOGY REPORT*  Clinical Data: Pelvic pain.  History of endometrial carcinoma.  CT ABDOMEN AND PELVIS WITH CONTRAST  Technique:  Multidetector CT imaging of the abdomen and pelvis was performed following the standard protocol during bolus administration of intravenous contrast.  Contrast: OMNIPAQUE IOHEXOL 300 MG/ML  SOLN  Comparison: CT 01/05/2006  Findings: Lung bases are clear.  No pericardial fluid.  No focal hepatic lesion.  The gallbladder, pancreas, spleen, adrenal glands, kidneys are normal.  There is a cortical scarring of the right kidney.  No hydronephrosis.  The stomach, small bowel, appendix, cecum are normal.  The colon is normal to the level of the descending colon.  There is a lobular mass at the vaginal cuff measuring 7.0 x 3.2 cm in axial dimension and approximately 4 cm craniocaudad dimension. There is a mass compresses the distal aspect of the sigmoid colon just above the rectum (image 62, axial, image 57, coronal series). No mass approximates the posterior wall the bladder without clear invasion .There is no evidence of obstruction of stool proximal to this mass compressing the sigmoid colon.  There is a large necrotic lymph node measuring 2.8 cm within the sigmoid mesocolon centrally (60).  Patient status post hysterectomy and oophorectomy.  Small bilateral external iliac lymph nodes.  For example 8 mm node on the right and 5 mm node on the left (image 59).   There is no periaortic adenopathy.  No periportal or mesenteric adenopathy  Review of the bone windows demonstrates post lumbar  fusion.  No aggressive osseous lesions.  IMPRESSION:  1.  Lobular mass at the vaginal cuff is consistent with endometrial carcinoma progression.  2.  Mass extends to the serosal surface of the sigmoid colon and compresses the lumen of the sigmoid colon.  No obstruction at this time.  3.  Mass  approximates the posterior wall of the bladder without clear invasion.  4. Large necrotic lymph node within the sigmoid mesocolon superior to the vaginal cuff.  5.  Small bilateral iliac lymph nodes.   Original Report Authenticated By: Genevive Bi, M.D.   Nm Pet Image Restag (ps) Skull Base To Thigh  05/09/2013   *RADIOLOGY REPORT*  Clinical Data: Subsequent treatment strategy for endometrial cancer. Hysterectomy 12/19/2011.  Recurrent pelvic masses on the CT.  NUCLEAR MEDICINE PET SKULL BASE TO THIGH  Fasting Blood Glucose:  141  Technique:  18.3 mCi F-18 FDG was injected intravenously. CT data was obtained and used for attenuation correction and anatomic localization only.  (This was not acquired as a diagnostic CT examination.) Additional exam technical data entered on technologist worksheet.  Comparison:  Abdominal pelvic CT 05/05/2013.  Findings:  Neck: No hypermetabolic lymph nodes in the neck.  Chest:  There are no hypermetabolic mediastinal or hilar lymph nodes.  There is no abnormal metabolic activity within the lungs. CT images demonstrate mild atherosclerosis.  There are no suspicious pulmonary findings.  Abdomen/Pelvis:  There is no abnormal metabolic activity within the liver, adrenal glands, spleen or pancreas.  No hypermetabolic lymph nodes are identified within the abdomen.  However, the large lobulated mass contiguous with the vaginal cuff is hypermetabolic. This demonstrates an SUV max of approximately 24.5.  The mass measures up to 6.8 x 3.6 cm transverse on image 186.  The  necrotic nodal mass within the sigmoid the mesocolon is also hypermetabolic. This measures 3.1 cm on image 175 and demonstrates an SUV max of  17.1.  The right external iliac and pelvic sidewall lymph nodes demonstrated on the recent CT are also hypermetabolic.  The largest node measures 2.0 cm on image 176 and has an SUV max of 17.1.  No hypermetabolic left pelvic side wall nodes are identified.  There is no generalized peritoneal hypermetabolic activity or ascites. Scattered activity in the colon is likely physiologic.  The right kidney demonstrates lobularity and scarring.  Skeleton:  No focal hypermetabolic activity to suggest skeletal metastasis.  IMPRESSION:  1.  Multifocal recurrence within the pelvis involving the vaginal cuff, mesenteric and right pelvic sidewall lymph nodes. 2.  No distant metastases identified.   Original Report Authenticated By: Carey Bullocks, M.D.   Ct Biopsy  05/14/2013   *RADIOLOGY REPORT*  Clinical history:67 year old with history of endometrial cancer and suspicious lesions in the pelvis.  PROCEDURE(S): CT GUIDED BIOPSY OF PELVIC MASS  Physician: Rachelle Hora. Henn, MD  Medications:Versed 2 mg, Fentanyl 100 mcg.  A radiology nurse monitored the patient for moderate sedation.  Moderate sedation time:28 minutes  Procedure:The procedure was explained to the patient.  The risks and benefits of the procedure were discussed and the patient's questions were addressed.  Informed consent was obtained from the patient.  The patient was placed prone on the CT scanner.  Images of the pelvis were obtained.  The left gluteal area was prepped and draped in a sterile fashion.  Skin and soft tissues were anesthetized with lidocaine.  17 gauge needle was directed into the pelvic mass from a left transgluteal approach.  Needle position confirmed along the posterior aspect of the lesion.  Three core biopsies were obtained with an 18 gauge device.  Samples placed in formalin.  17 gauge needle was removed  without complication.  Findings:There is a pelvic mass situated adjacent to the vaginal cuff.  The needle was positioned along the posterior aspect of the lesion.  Complications: None  Impression:CT guided core biopsies of the pelvic mass.   Original Report Authenticated By: Richarda Overlie, M.D.    ASSESSMENT: 67 year old female with  #1 recurrent endometrioid adenocarcinoma of the uterus. Patient is receiving palliative chemotherapy consisting of Taxol and carboplatinum.   #2 patient underwent Port-A-Cath placement by interventional radiology and underwent chemotherapy teaching class.   #3 patient was begun on Taxol and carboplatinum starting on 06/03/2013. Today is cycle 6 day 1.  #4 pain control patient is on fentanyl patches as well as oxycodone. Her pain is really well controlled, and she takes the oxycodone very infrequently    PLAN:  #1 proceed with cycle #6 of Taxol carboplatinum today.  #2 patient will take gabapentin 200 mg 4 times a day. She was unable to tolerate it 300 mg capsules.  #3 she'll be seen back in one week's time for interim labs.  #4 after completion of 6 cycles of chemotherapy we will plan on getting the patient set up for CT of the chest abdomen and pelvis for restaging purposes  All questions were answered. The patient knows to call the clinic with any problems, questions or concerns. We can certainly see the patient much sooner if necessary.  I spent 25 minutes counseling the patient face to face. The total time spent in the appointment was 30 minutes.

## 2013-09-24 ENCOUNTER — Ambulatory Visit (HOSPITAL_BASED_OUTPATIENT_CLINIC_OR_DEPARTMENT_OTHER): Payer: Medicare Other

## 2013-09-24 VITALS — BP 117/66 | HR 87 | Temp 98.1°F

## 2013-09-24 DIAGNOSIS — C549 Malignant neoplasm of corpus uteri, unspecified: Secondary | ICD-10-CM

## 2013-09-24 DIAGNOSIS — Z5189 Encounter for other specified aftercare: Secondary | ICD-10-CM

## 2013-09-24 DIAGNOSIS — C541 Malignant neoplasm of endometrium: Secondary | ICD-10-CM

## 2013-09-24 MED ORDER — PEGFILGRASTIM INJECTION 6 MG/0.6ML
6.0000 mg | Freq: Once | SUBCUTANEOUS | Status: AC
  Administered 2013-09-24: 6 mg via SUBCUTANEOUS
  Filled 2013-09-24: qty 0.6

## 2013-09-30 ENCOUNTER — Telehealth: Payer: Self-pay | Admitting: Oncology

## 2013-09-30 ENCOUNTER — Other Ambulatory Visit (HOSPITAL_BASED_OUTPATIENT_CLINIC_OR_DEPARTMENT_OTHER): Payer: Medicare Other

## 2013-09-30 ENCOUNTER — Ambulatory Visit (HOSPITAL_BASED_OUTPATIENT_CLINIC_OR_DEPARTMENT_OTHER): Payer: Medicare Other | Admitting: Adult Health

## 2013-09-30 ENCOUNTER — Encounter: Payer: Self-pay | Admitting: Adult Health

## 2013-09-30 VITALS — BP 105/66 | HR 85 | Temp 98.2°F | Resp 20 | Ht 60.0 in | Wt 153.9 lb

## 2013-09-30 DIAGNOSIS — C549 Malignant neoplasm of corpus uteri, unspecified: Secondary | ICD-10-CM

## 2013-09-30 DIAGNOSIS — C541 Malignant neoplasm of endometrium: Secondary | ICD-10-CM

## 2013-09-30 DIAGNOSIS — G609 Hereditary and idiopathic neuropathy, unspecified: Secondary | ICD-10-CM

## 2013-09-30 LAB — COMPREHENSIVE METABOLIC PANEL (CC13)
ALT: 16 U/L (ref 0–55)
AST: 14 U/L (ref 5–34)
Alkaline Phosphatase: 83 U/L (ref 40–150)
CO2: 25 mEq/L (ref 22–29)
Glucose: 149 mg/dl — ABNORMAL HIGH (ref 70–140)
Sodium: 139 mEq/L (ref 136–145)
Total Bilirubin: 0.35 mg/dL (ref 0.20–1.20)
Total Protein: 6.3 g/dL — ABNORMAL LOW (ref 6.4–8.3)

## 2013-09-30 LAB — CBC WITH DIFFERENTIAL/PLATELET
BASO%: 0.4 % (ref 0.0–2.0)
EOS%: 0.5 % (ref 0.0–7.0)
Eosinophils Absolute: 0 10*3/uL (ref 0.0–0.5)
LYMPH%: 46.3 % (ref 14.0–49.7)
MCH: 37.9 pg — ABNORMAL HIGH (ref 25.1–34.0)
MCHC: 34.2 g/dL (ref 31.5–36.0)
MCV: 110.8 fL — ABNORMAL HIGH (ref 79.5–101.0)
MONO%: 13.1 % (ref 0.0–14.0)
Platelets: 124 10*3/uL — ABNORMAL LOW (ref 145–400)
RBC: 2.35 10*6/uL — ABNORMAL LOW (ref 3.70–5.45)
RDW: 16.3 % — ABNORMAL HIGH (ref 11.2–14.5)
WBC: 5.6 10*3/uL (ref 3.9–10.3)
lymph#: 2.6 10*3/uL (ref 0.9–3.3)

## 2013-09-30 NOTE — Telephone Encounter (Signed)
s/w pt re appt for 12/3.

## 2013-09-30 NOTE — Patient Instructions (Signed)
Doing well.  Drink plenty of fluids.  Continue taking Neurontin 200mg  four times a day.  We will see you back on 12/3 to discuss your CT scan results.  Please call us if you have any questions or concerns.

## 2013-09-30 NOTE — Progress Notes (Addendum)
OFFICE PROGRESS NOTE  CC  [name],[name], MD [address], [place] [address] Dr. Marvia Pickles [name] 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 She had undergone a robotic assisted total laparoscopic hysterectomy bilateral salpingo-oophorectomy right pelvic lymph node biopsy on 12/19/2011 without any complications. 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 There was no involvement of other organs lymphovascular invasion was not identified. 3 lymph nodes were negative for metastatic disease. 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 Patient subsequently continued to be seen by gynecologic oncology. In October 2013 patient had a Pap smear that was normal limits there was noted to be some granulation tissue that was removed from the cough. There is no evidence of disease.  #2patient was seen by Dr. Zenda Alpers on 04/29/2013 she had vaginal exam performed that was notable for a nodule in the vagina concerning for recurrence. A rectal examination revealed a firm 8 cm mass also. Because of this she had a biopsy performed. She also had a CT scan of the abdomen and pelvis ordered to further evaluate the sigmoid mass. The pathology came back inconclusive. However the CT of the abdomen and pelvis showed a lobular mass at the vagina cuff measuring 7.0 x 3.2 cm proximally 4 cm craniocaudal dimension. The mass compressed the distal aspect of the sigmoid colon just above the rectum there was no mass approximates the posterior wall of the bladder without clear invasion noted evidence of  obstruction of stool proximal to this mass. There was also noted to be a large necrotic lymph node measuring 2.8 cm within the sigmoid mesocolon centrally. There are also small bilateral external iliac lymph nodes. There were no aggressive osseous lesions.   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  #4 patient was begun on palliative chemotherapy starting on 06/03/2013 with Taxol carboplatinum. Total of 6 cycles of treatment was planned and completed on 09/23/13.  #5 pain control: She is being managed with fentanyl patches and oxycodone.  CURRENT THERAPY: Taxol/Carbo cycle 6 day 8  INTERVAL HISTORY: Pam Vazquez 67 y.o. female returns for followup after receiving 6 cycles of Taxol carbo.  She is doing well today.  She started taking Gabapentin 200mg  four times per day and her neuropathy is improved with this.  She is feeling well today and denies fevers, chills, nausea, vomiting, constipation, diarrhea, or any further concerns.    MEDICAL HISTORY: Past Medical History  Diagnosis Date  . Diabetes mellitus   . PONV (postoperative nausea and vomiting)   . Cancer     endometrial  . Angina     stress test on chart from 3/12/ none since March 2012  . Chronic kidney disease     kidney stones  . Hypertension     / EKG 1/13 Epic    ALLERGIES:  has No Known Allergies.  MEDICATIONS:  Current Outpatient Prescriptions  Medication Sig Dispense Refill  . amLODipine (NORVASC) 2.5 MG tablet Take 2.5 mg by mouth every morning.       . cyanocobalamin 2000 MCG tablet Take 2,000 mcg by mouth 2 (  two) times daily.      [name] dexamethasone (DECADRON) 4 MG tablet Take 2 tablets (8 mg total) by mouth 2 (two) times daily with a meal. Take two times a day starting the day after chemotherapy for 3 days.  30 tablet  1  . doxycycline (VIBRA-TABS) 100 MG tablet Take 100 mg by mouth every other day.      . fentaNYL  (DURAGESIC - DOSED MCG/HR) 25 MCG/HR patch Place 1 patch (25 mcg total) onto the skin every 3 (three) days.  10 patch  0  . gabapentin (NEURONTIN) 100 MG capsule Take 2 capsules (200 mg total) by mouth 3 (three) times daily.  180 capsule  1  . lidocaine-prilocaine (EMLA) cream Apply topically as needed.  30 g  6  . lisinopril-hydrochlorothiazide (PRINZIDE,ZESTORETIC) 20-25 MG per tablet Take 1 tablet by mouth daily after breakfast.       . loratadine (CLARITIN) 10 MG tablet Take 10 mg by mouth daily.      . metFORMIN (GLUCOPHAGE) 500 MG tablet Take 500 mg by mouth 2 (two) times daily.       . metoprolol tartrate (LOPRESSOR) 25 MG tablet Take 25 mg by mouth 2 (two) times daily.       . Multiple Vitamin (MULTIVITAMIN WITH MINERALS) TABS Take 1 tablet by mouth every morning. She takes Surveyor, quantity.      [name] omeprazole (PRILOSEC) 40 MG capsule Take 1 capsule (40 mg total) by mouth daily.  30 capsule  6  . ondansetron (ZOFRAN) 8 MG tablet Take 1 tablet (8 mg total) by mouth 2 (two) times daily. Take two times a day starting the day after chemo for 3 days. Then take two times a day as needed for nausea or vomiting.  30 tablet  1  . prochlorperazine (COMPAZINE) 10 MG tablet Take 1 tablet (10 mg total) by mouth every 6 (six) hours as needed (Nausea or vomiting).  30 tablet  1  . LORazepam (ATIVAN) 0.5 MG tablet Take 1 tablet (0.5 mg total) by mouth every 6 (six) hours as needed (Nausea or vomiting).  30 tablet  0   No current facility-administered medications for this visit.    SURGICAL HISTORY:  Past Surgical History  Procedure Laterality Date  . Hysteroscopy w/d&c  11/21/2011    Procedure: DILATATION AND CURETTAGE /HYSTEROSCOPY;  Surgeon: Miguel Aschoff;  Location: [facility];  Service: Gynecology;  Laterality: N/A;  . Tubal ligation    . Breast mass removal  1984, 1992    L breast  . Lithotripsy  2000, 2002  . Back surgery      91 and 2001  . Dilation and curettage of uterus    . Node dissection   12/19/2011    Procedure: NODE DISSECTION;  Surgeon: Laurette Schimke, MD PHD;  Location: [facility];  Service: Gynecology;  Laterality: N/A;    REVIEW OF SYSTEMS:  A 10 point review of systems was conducted and is otherwise negative except for what is noted above.    PHYSICAL EXAMINATION: Blood pressure 105/66, pulse 85, temperature 98.2 F (36.8 C), temperature source Oral, resp. rate 20, height 5' (1.524 m), weight 153 lb 14.4 oz (69.809 kg). Body mass index is 30.06 kg/(m^2). General: Patient is a well appearing female in no acute distress HEENT: PERRLA, sclerae anicteric no conjunctival pallor, MMM Neck: supple, no palpable adenopathy Lungs: clear to auscultation bilaterally, no wheezes, rhonchi, or rales Cardiovascular: regular rate rhythm, S1, S2, no murmurs, rubs or gallops Abdomen: Soft,  non-tender, non-distended, normoactive bowel sounds, no HSM Extremities: warm and well perfused, no clubbing, cyanosis, or edema Skin: No rashes or lesions Neuro: Non-focal ECOG PERFORMANCE STATUS: 0 - Asymptomatic  LABORATORY DATA: Lab Results  Component Value Date   WBC 5.6 09/30/2013   HGB 8.9* 09/30/2013   HCT 26.0* 09/30/2013   MCV 110.8* 09/30/2013   PLT 124* 09/30/2013      Chemistry      Component Value Date/Time   NA 139 09/30/2013 1013   NA 141 12/20/2011 0334   K 3.7 09/30/2013 1013   K 4.1 12/20/2011 0334   CL 104 04/29/2013 1229   CL 104 12/20/2011 0334   CO2 25 09/30/2013 1013   CO2 27 12/20/2011 0334   BUN 38.0* 09/30/2013 1013   BUN 10 12/20/2011 0334   CREATININE 1.1 09/30/2013 1013   CREATININE 0.80 12/20/2011 0334      Component Value Date/Time   CALCIUM 10.0 09/30/2013 1013   CALCIUM 9.5 12/20/2011 0334   ALKPHOS 83 09/30/2013 1013   ALKPHOS 69 12/15/2011 1125   AST 14 09/30/2013 1013   AST 22 12/15/2011 1125   ALT 16 09/30/2013 1013   ALT 19 12/15/2011 1125   BILITOT 0.35 09/30/2013 1013   BILITOT 0.8 12/15/2011 1125    ADDITIONAL INFORMATION: The malignant cells are positive  for estrogen receptor, progesterone receptor, cytokeratin 7 and focally positive for CDX-2. They are negative for cytokeratin 20. Given the clinical history as well as the histologic features, the findings are consistent with metastatic endometrioid adenocarcinoma. (JBK:kh 05-19-13) Pecola Leisure MD Pathologist, Electronic Signature ( Signed 05/19/2013) FINAL DIAGNOSIS Diagnosis Pelvis, biopsy, Central lower - METASTATIC CARCINOMA WITH ASSOCIATED TUMOR NECROSIS. Microscopic Comment Immunostains will be performed and an addendum report will follow. (HCL:kh 05-15-13) Abigail Miyamoto MD Pathologist, Electronic Signature (Case signed 05/15/2013) Specimen Gross and Clinical Information Specimen(s) Obtained: Pelvis, biopsy, Central lower Specimen Clinical Information Recurrent cancer (tl) Gross Received in formalin are fragments and cores of soft white tissue having an aggregate measurement of 1.3 x 0.5 x 0.1 cm. The specimen is submitted in toto. (GRP: ecj 05/14/2013) 1 of 2 Duplicate copy FINAL for Vazquez, Pam P ([id number]) Stain(s) used in Diagnosis: The following stain(s) were used in diagnosing the case: PR - NOACIS, ER - NOACIS, CK-7, CDX-2, CK 20. The control(s) stained appropriately. Disc   RADIOGRAPHIC STUDIES:  Dg Chest 2 View  05/06/2013   *RADIOLOGY REPORT*  Clinical Data: Endometrial cancer  CHEST - 2 VIEW  Comparison: December 15, 2011.  Findings: Cardiomediastinal silhouette appears normal.  No acute pulmonary disease is noted.  Bony thorax is intact.  Stable small dense nodule seen in right lower lobe compared to prior exam most consistent with granuloma.  IMPRESSION: Stable small nodule seen in right lower lobe most consistent with granuloma.  No acute cardiopulmonary abnormality seen.   Original Report Authenticated By: Lupita Raider.,  M.D.   Ct Abdomen Pelvis W Contrast  05/05/2013   *RADIOLOGY REPORT*  Clinical Data: Pelvic pain.  History of endometrial carcinoma.  CT  ABDOMEN AND PELVIS WITH CONTRAST  Technique:  Multidetector CT imaging of the abdomen and pelvis was performed following the standard protocol during bolus administration of intravenous contrast.  Contrast: OMNIPAQUE IOHEXOL 300 MG/ML  SOLN  Comparison: CT 01/05/2006  Findings: Lung bases are clear.  No pericardial fluid.  No focal hepatic lesion.  The gallbladder, pancreas, spleen, adrenal glands, kidneys are normal.  There is a cortical scarring of the right  kidney.  No hydronephrosis.  The stomach, small bowel, appendix, cecum are normal.  The colon is normal to the level of the descending colon.  There is a lobular mass at the vaginal cuff measuring 7.0 x 3.2 cm in axial dimension and approximately 4 cm craniocaudad dimension. There is a mass compresses the distal aspect of the sigmoid colon just above the rectum (image 62, axial, image 57, coronal series). No mass approximates the posterior wall the bladder without clear invasion .There is no evidence of obstruction of stool proximal to this mass compressing the sigmoid colon.  There is a large necrotic lymph node measuring 2.8 cm within the sigmoid mesocolon centrally (60).  Patient status post hysterectomy and oophorectomy.  Small bilateral external iliac lymph nodes.  For example 8 mm node on the right and 5 mm node on the left (image 59).  There is no periaortic adenopathy.  No periportal or mesenteric adenopathy  Review of the bone windows demonstrates post lumbar  fusion.  No aggressive osseous lesions.  IMPRESSION:  1.  Lobular mass at the vaginal cuff is consistent with endometrial carcinoma progression.  2.  Mass extends to the serosal surface of the sigmoid colon and compresses the lumen of the sigmoid colon.  No obstruction at this time.  3.  Mass  approximates the posterior wall of the bladder without clear invasion.  4. Large necrotic lymph node within the sigmoid mesocolon superior to the vaginal cuff.  5.  Small bilateral iliac lymph nodes.    Original Report Authenticated By: Genevive Bi, M.D.   Nm Pet Image Restag (ps) Skull Base To Thigh  05/09/2013   *RADIOLOGY REPORT*  Clinical Data: Subsequent treatment strategy for endometrial cancer. Hysterectomy 12/19/2011.  Recurrent pelvic masses on the CT.  NUCLEAR MEDICINE PET SKULL BASE TO THIGH  Fasting Blood Glucose:  141  Technique:  18.3 mCi F-18 FDG was injected intravenously. CT data was obtained and used for attenuation correction and anatomic localization only.  (This was not acquired as a diagnostic CT examination.) Additional exam technical data entered on technologist worksheet.  Comparison:  Abdominal pelvic CT 05/05/2013.  Findings:  Neck: No hypermetabolic lymph nodes in the neck.  Chest:  There are no hypermetabolic mediastinal or hilar lymph nodes.  There is no abnormal metabolic activity within the lungs. CT images demonstrate mild atherosclerosis.  There are no suspicious pulmonary findings.  Abdomen/Pelvis:  There is no abnormal metabolic activity within the liver, adrenal glands, spleen or pancreas.  No hypermetabolic lymph nodes are identified within the abdomen.  However, the large lobulated mass contiguous with the vaginal cuff is hypermetabolic. This demonstrates an SUV max of approximately 24.5.  The mass measures up to 6.8 x 3.6 cm transverse on image 186.  The necrotic nodal mass within the sigmoid the mesocolon is also hypermetabolic. This measures 3.1 cm on image 175 and demonstrates an SUV max of 17.1.  The right external iliac and pelvic sidewall lymph nodes demonstrated on the recent CT are also hypermetabolic.  The largest node measures 2.0 cm on image 176 and has an SUV max of 17.1.  No hypermetabolic left pelvic side wall nodes are identified.  There is no generalized peritoneal hypermetabolic activity or ascites. Scattered activity in the colon is likely physiologic.  The right kidney demonstrates lobularity and scarring.  Skeleton:  No focal hypermetabolic  activity to suggest skeletal metastasis.  IMPRESSION:  1.  Multifocal recurrence within the pelvis involving the vaginal cuff, mesenteric and right pelvic  sidewall lymph nodes. 2.  No distant metastases identified.   Original Report Authenticated By: Carey Bullocks, M.D.   Ct Biopsy  05/14/2013   *RADIOLOGY REPORT*  Clinical history:67 year old with history of endometrial cancer and suspicious lesions in the pelvis.  PROCEDURE(S): CT GUIDED BIOPSY OF PELVIC MASS  Physician: Rachelle Hora. Henn, MD  Medications:Versed 2 mg, Fentanyl 100 mcg.  A radiology nurse monitored the patient for moderate sedation.  Moderate sedation time:28 minutes  Procedure:The procedure was explained to the patient.  The risks and benefits of the procedure were discussed and the patient's questions were addressed.  Informed consent was obtained from the patient.  The patient was placed prone on the CT scanner.  Images of the pelvis were obtained.  The left gluteal area was prepped and draped in a sterile fashion.  Skin and soft tissues were anesthetized with lidocaine.  17 gauge needle was directed into the pelvic mass from a left transgluteal approach.  Needle position confirmed along the posterior aspect of the lesion.  Three core biopsies were obtained with an 18 gauge device.  Samples placed in formalin.  17 gauge needle was removed without complication.  Findings:There is a pelvic mass situated adjacent to the vaginal cuff.  The needle was positioned along the posterior aspect of the lesion.  Complications: None  Impression:CT guided core biopsies of the pelvic mass.   Original Report Authenticated By: Richarda Overlie, M.D.    ASSESSMENT: 68 year old female with  #1 recurrent endometrioid adenocarcinoma of the uterus. Patient is received palliative chemotherapy consisting of Taxol and carboplatinum for 6 cycles.   #2 patient underwent Port-A-Cath placement by interventional radiology and underwent chemotherapy teaching class.   #3  patient was begun on Taxol and carboplatinum starting on 06/03/2013. Today is cycle 6 day 8.  #4 pain control patient is on fentanyl patches as well as oxycodone. Her pain is really well controlled, and she takes the oxycodone very infrequently    PLAN:  #1 Ms. Santizo is doing well today following her sixth cycle of Taxol/Carbo.  She has a CT chest/abdomen/pelvis to evaluate her response scheduled on 10/14/13.  Her labs are stable, I reviewed them with her in detail.  I encouraged her to drink plenty of fluids.    #2 She will continue Gabapentin 200mg  TID.  This is improving her neuropathy.    #3 She will return to [place] on 10/15/13 to discuss her CT scan results and for decision making on further chemotherapy regimens.    All questions were answered. The patient knows to call the clinic with any problems, questions or concerns. We can certainly see the patient much sooner if necessary.  I spent 25 minutes counseling the patient face to face. The total time spent in the appointment was 30 minutes.  Illa Level, NP Medical Oncology [facility] [phone]  ATTENDING'S ATTESTATION:  I personally reviewed patient's chart, examined patient myself, formulated the treatment plan as followed.    67 year old female with recurrent endometrioid adenocarcinoma.. She has been receiving palliative chemotherapy consisting of Taxol and carboplatinum. She has completed 6 cycles. She will have restaging studies performed to evaluate response to therapy and I will see her back for followup to determine the next course of action.  Drue Second, MD Medical/Oncology [facility] [phone] (beeper) [phone] (Office)  10/19/2013, 10:23 PM

## 2013-10-14 ENCOUNTER — Encounter (HOSPITAL_COMMUNITY): Payer: Self-pay

## 2013-10-14 ENCOUNTER — Ambulatory Visit (HOSPITAL_COMMUNITY)
Admission: RE | Admit: 2013-10-14 | Discharge: 2013-10-14 | Disposition: A | Discharge status: Home / Self Care | Payer: Medicare Other | Source: Ambulatory Visit | Attending: Oncology | Admitting: Oncology

## 2013-10-14 DIAGNOSIS — C541 Malignant neoplasm of endometrium: Secondary | ICD-10-CM

## 2013-10-14 DIAGNOSIS — R599 Enlarged lymph nodes, unspecified: Secondary | ICD-10-CM | POA: Insufficient documentation

## 2013-10-14 DIAGNOSIS — C549 Malignant neoplasm of corpus uteri, unspecified: Secondary | ICD-10-CM | POA: Insufficient documentation

## 2013-10-14 DIAGNOSIS — Z9221 Personal history of antineoplastic chemotherapy: Secondary | ICD-10-CM | POA: Insufficient documentation

## 2013-10-14 DIAGNOSIS — N281 Cyst of kidney, acquired: Secondary | ICD-10-CM | POA: Insufficient documentation

## 2013-10-14 DIAGNOSIS — I700 Atherosclerosis of aorta: Secondary | ICD-10-CM | POA: Insufficient documentation

## 2013-10-14 MED ORDER — IOHEXOL 300 MG/ML  SOLN
100.0000 mL | Freq: Once | INTRAMUSCULAR | Status: AC | PRN
  Administered 2013-10-14: 100 mL via INTRAVENOUS

## 2013-10-15 ENCOUNTER — Telehealth: Payer: Self-pay | Admitting: *Deleted

## 2013-10-15 ENCOUNTER — Encounter: Payer: Self-pay | Admitting: Adult Health

## 2013-10-15 ENCOUNTER — Ambulatory Visit (HOSPITAL_BASED_OUTPATIENT_CLINIC_OR_DEPARTMENT_OTHER): Payer: BLUE CROSS/BLUE SHIELD | Admitting: Adult Health

## 2013-10-15 ENCOUNTER — Other Ambulatory Visit (HOSPITAL_BASED_OUTPATIENT_CLINIC_OR_DEPARTMENT_OTHER): Payer: Medicare Other | Admitting: Lab

## 2013-10-15 VITALS — BP 105/65 | HR 83 | Temp 98.2°F | Resp 18 | Ht 60.0 in | Wt 151.4 lb

## 2013-10-15 DIAGNOSIS — C541 Malignant neoplasm of endometrium: Secondary | ICD-10-CM

## 2013-10-15 DIAGNOSIS — G589 Mononeuropathy, unspecified: Secondary | ICD-10-CM

## 2013-10-15 DIAGNOSIS — C549 Malignant neoplasm of corpus uteri, unspecified: Secondary | ICD-10-CM

## 2013-10-15 DIAGNOSIS — D649 Anemia, unspecified: Secondary | ICD-10-CM

## 2013-10-15 DIAGNOSIS — R109 Unspecified abdominal pain: Secondary | ICD-10-CM

## 2013-10-15 LAB — CBC WITH DIFFERENTIAL/PLATELET
BASO%: 0.2 % (ref 0.0–2.0)
Basophils Absolute: 0 10*3/uL (ref 0.0–0.1)
EOS%: 0 % (ref 0.0–7.0)
Eosinophils Absolute: 0 10*3/uL (ref 0.0–0.5)
HGB: 7.3 g/dL — ABNORMAL LOW (ref 11.6–15.9)
LYMPH%: 41 % (ref 14.0–49.7)
MCH: 35.8 pg — ABNORMAL HIGH (ref 25.1–34.0)
MCHC: 32.3 g/dL (ref 31.5–36.0)
MCV: 110.8 fL — ABNORMAL HIGH (ref 79.5–101.0)
MONO%: 7.9 % (ref 0.0–14.0)
NEUT#: 2.9 10*3/uL (ref 1.5–6.5)
Platelets: 49 10*3/uL — ABNORMAL LOW (ref 145–400)
RDW: 16.3 % — ABNORMAL HIGH (ref 11.2–14.5)

## 2013-10-15 LAB — COMPREHENSIVE METABOLIC PANEL (CC13)
AST: 18 U/L (ref 5–34)
Alkaline Phosphatase: 59 U/L (ref 40–150)
Anion Gap: 10 mEq/L (ref 3–11)
BUN: 15.8 mg/dL (ref 7.0–26.0)
CO2: 27 mEq/L (ref 22–29)
Creatinine: 1 mg/dL (ref 0.6–1.1)
Glucose: 195 mg/dl — ABNORMAL HIGH (ref 70–140)
Sodium: 142 mEq/L (ref 136–145)
Total Bilirubin: 0.62 mg/dL (ref 0.20–1.20)

## 2013-10-15 MED ORDER — FENTANYL 25 MCG/HR TD PT72: 25.0000 ug | MEDICATED_PATCH | TRANSDERMAL | Status: DC

## 2013-10-15 NOTE — Telephone Encounter (Signed)
appts made and printed...td 

## 2013-10-15 NOTE — Patient Instructions (Signed)

## 2013-10-15 NOTE — Progress Notes (Addendum)
OFFICE PROGRESS NOTE  CC  [name],[name], MD [address], [place] [address] Dr. Marvia Pickles [name] 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 She had undergone a robotic assisted total laparoscopic hysterectomy bilateral salpingo-oophorectomy right pelvic lymph node biopsy on 12/19/2011 without any complications. 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 There was no involvement of other organs lymphovascular invasion was not identified. 3 lymph nodes were negative for metastatic disease. 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 Patient subsequently continued to be seen by gynecologic oncology. In October 2013 patient had a Pap smear that was normal limits there was noted to be some granulation tissue that was removed from the cough. There is no evidence of disease.  #2patient was seen by Dr. Zenda Alpers on 04/29/2013 she had vaginal exam performed that was notable for a nodule in the vagina concerning for recurrence. A rectal examination revealed a firm 8 cm mass also. Because of this she had a biopsy performed. She also had a CT scan of the abdomen and pelvis ordered to further evaluate the sigmoid mass. The pathology came back inconclusive. However the CT of the abdomen and pelvis showed a lobular mass at the vagina cuff measuring 7.0 x 3.2 cm proximally 4 cm craniocaudal dimension. The mass compressed the distal aspect of the sigmoid colon just above the rectum there was no mass approximates the posterior wall of the bladder without clear invasion noted evidence of  obstruction of stool proximal to this mass. There was also noted to be a large necrotic lymph node measuring 2.8 cm within the sigmoid mesocolon centrally. There are also small bilateral external iliac lymph nodes. There were no aggressive osseous lesions.   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  #4 patient was begun on palliative chemotherapy starting on 06/03/2013 with Taxol carboplatinum. Total of 6 cycles of treatment was planned and completed on 09/23/13.  #5 pain control: She is being managed with fentanyl patches and oxycodone.  CURRENT THERAPY: Taxotere/Carbo to start in January 2015  INTERVAL HISTORY: Pam Vazquez 67 y.o. female returns for followup after her CT scans.  She has had a good interval response to the 6 cycles of Taxol/Carbo she received.  Her pain is controlled with Fentanyl Patches.  She continues to have neuropathy which is stable and she continues to take Neurontin.  Otherwise, she is well and a 10 point ROS is negative  MEDICAL HISTORY: Past Medical History  Diagnosis Date  . Diabetes mellitus   . PONV (postoperative nausea and vomiting)   . Cancer     endometrial  . Angina     stress test on chart from 3/12/ none since March 2012  . Chronic kidney disease     kidney stones  . Hypertension     / EKG 1/13 Epic    ALLERGIES:  has No Known Allergies.  MEDICATIONS:  Current Outpatient Prescriptions  Medication Sig Dispense Refill  . amLODipine (NORVASC) 2.5 MG tablet Take 2.5 mg by mouth every morning.       . cyanocobalamin 2000 MCG tablet  Take 2,000 mcg by mouth 2 (two) times daily.      [name] dexamethasone (DECADRON) 4 MG tablet Take 2 tablets (8 mg total) by mouth 2 (two) times daily with a meal. Take two times a day starting the day after chemotherapy for 3 days.  30 tablet  1  . doxycycline (VIBRA-TABS) 100 MG tablet Take 100 mg by mouth every other day.       . fentaNYL (DURAGESIC - DOSED MCG/HR) 25 MCG/HR patch Place 1 patch (25 mcg total) onto the skin every 3 (three) days.  10 patch  0  . gabapentin (NEURONTIN) 100 MG capsule Take 2 capsules (200 mg total) by mouth 3 (three) times daily.  180 capsule  1  . lidocaine-prilocaine (EMLA) cream Apply topically as needed.  30 g  6  . lisinopril-hydrochlorothiazide (PRINZIDE,ZESTORETIC) 20-25 MG per tablet Take 1 tablet by mouth daily after breakfast.       . loratadine (CLARITIN) 10 MG tablet Take 10 mg by mouth daily.      [name] LORazepam (ATIVAN) 0.5 MG tablet Take 1 tablet (0.5 mg total) by mouth every 6 (six) hours as needed (Nausea or vomiting).  30 tablet  0  . metFORMIN (GLUCOPHAGE) 500 MG tablet Take 500 mg by mouth 2 (two) times daily.       . metoprolol tartrate (LOPRESSOR) 25 MG tablet Take 25 mg by mouth 2 (two) times daily.       . Multiple Vitamin (MULTIVITAMIN WITH MINERALS) TABS Take 1 tablet by mouth every morning. She takes Surveyor, quantity.      [name] omeprazole (PRILOSEC) 40 MG capsule Take 1 capsule (40 mg total) by mouth daily.  30 capsule  6  . ondansetron (ZOFRAN) 8 MG tablet Take 1 tablet (8 mg total) by mouth 2 (two) times daily. Take two times a day starting the day after chemo for 3 days. Then take two times a day as needed for nausea or vomiting.  30 tablet  1  . prochlorperazine (COMPAZINE) 10 MG tablet Take 1 tablet (10 mg total) by mouth every 6 (six) hours as needed (Nausea or vomiting).  30 tablet  1   No current facility-administered medications for this visit.    SURGICAL HISTORY:  Past Surgical History  Procedure Laterality Date  . Hysteroscopy w/d&c  11/21/2011    Procedure: DILATATION AND CURETTAGE /HYSTEROSCOPY;  Surgeon: Miguel Aschoff;  Location: [facility];  Service: Gynecology;  Laterality: N/A;  . Tubal ligation    . Breast mass removal  1984, 1992    L breast  . Lithotripsy  2000, 2002  . Back surgery      91 and 2001  . Dilation and curettage of uterus    . Node  dissection  12/19/2011    Procedure: NODE DISSECTION;  Surgeon: Laurette Schimke, MD PHD;  Location: [facility];  Service: Gynecology;  Laterality: N/A;    REVIEW OF SYSTEMS:  A 10 point review of systems was conducted and is otherwise negative except for what is noted above.    PHYSICAL EXAMINATION: Blood pressure 105/65, pulse 83, temperature 98.2 F (36.8 C), temperature source Oral, resp. rate 18, height 5' (1.524 m), weight 151 lb 6.4 oz (68.675 kg). Body mass index is 29.57 kg/(m^2). General: Patient is a well appearing female in no acute distress HEENT: PERRLA, sclerae anicteric no conjunctival pallor, MMM Neck: supple, no palpable adenopathy Lungs: clear to auscultation bilaterally, no wheezes, rhonchi, or rales Cardiovascular: regular rate rhythm, S1, S2, no  murmurs, rubs or gallops Abdomen: Soft, non-tender, non-distended, normoactive bowel sounds, no HSM Extremities: warm and well perfused, no clubbing, cyanosis, or edema Skin: No rashes or lesions Neuro: Non-focal ECOG PERFORMANCE STATUS: 0 - Asymptomatic  LABORATORY DATA: Lab Results  Component Value Date   WBC 5.7 10/15/2013   HGB 7.3* 10/15/2013   HCT 22.6* 10/15/2013   MCV 110.8* 10/15/2013   PLT 49* 10/15/2013      Chemistry      Component Value Date/Time   NA 142 10/15/2013 1308   NA 141 12/20/2011 0334   K 3.7 10/15/2013 1308   K 4.1 12/20/2011 0334   CL 104 04/29/2013 1229   CL 104 12/20/2011 0334   CO2 27 10/15/2013 1308   CO2 27 12/20/2011 0334   BUN 15.8 10/15/2013 1308   BUN 10 12/20/2011 0334   CREATININE 1.0 10/15/2013 1308   CREATININE 0.80 12/20/2011 0334      Component Value Date/Time   CALCIUM 10.7* 10/15/2013 1308   CALCIUM 9.5 12/20/2011 0334   ALKPHOS 59 10/15/2013 1308   ALKPHOS 69 12/15/2011 1125   AST 18 10/15/2013 1308   AST 22 12/15/2011 1125   ALT 13 10/15/2013 1308   ALT 19 12/15/2011 1125   BILITOT 0.62 10/15/2013 1308   BILITOT 0.8 12/15/2011 1125    ADDITIONAL INFORMATION: The malignant cells are positive for  estrogen receptor, progesterone receptor, cytokeratin 7 and focally positive for CDX-2. They are negative for cytokeratin 20. Given the clinical history as well as the histologic features, the findings are consistent with metastatic endometrioid adenocarcinoma. (JBK:kh 05-19-13) Pecola Leisure MD Pathologist, Electronic Signature ( Signed 05/19/2013) FINAL DIAGNOSIS Diagnosis Pelvis, biopsy, Central lower - METASTATIC CARCINOMA WITH ASSOCIATED TUMOR NECROSIS. Microscopic Comment Immunostains will be performed and an addendum report will follow. (HCL:kh 05-15-13) Abigail Miyamoto MD Pathologist, Electronic Signature (Case signed 05/15/2013) Specimen Gross and Clinical Information Specimen(s) Obtained: Pelvis, biopsy, Central lower Specimen Clinical Information Recurrent cancer (tl) Gross Received in formalin are fragments and cores of soft white tissue having an aggregate measurement of 1.3 x 0.5 x 0.1 cm. The specimen is submitted in toto. (GRP: ecj 05/14/2013) 1 of 2 Duplicate copy FINAL for Shorkey, Ajaya P ([id number]) Stain(s) used in Diagnosis: The following stain(s) were used in diagnosing the case: PR - NOACIS, ER - NOACIS, CK-7, CDX-2, CK 20. The control(s) stained appropriately. Disc   RADIOGRAPHIC STUDIES:  Dg Chest 2 View  05/06/2013   *RADIOLOGY REPORT*  Clinical Data: Endometrial cancer  CHEST - 2 VIEW  Comparison: December 15, 2011.  Findings: Cardiomediastinal silhouette appears normal.  No acute pulmonary disease is noted.  Bony thorax is intact.  Stable small dense nodule seen in right lower lobe compared to prior exam most consistent with granuloma.  IMPRESSION: Stable small nodule seen in right lower lobe most consistent with granuloma.  No acute cardiopulmonary abnormality seen.   Original Report Authenticated By: Lupita Raider.,  M.D.   Ct Abdomen Pelvis W Contrast  05/05/2013   *RADIOLOGY REPORT*  Clinical Data: Pelvic pain.  History of endometrial carcinoma.  CT  ABDOMEN AND PELVIS WITH CONTRAST  Technique:  Multidetector CT imaging of the abdomen and pelvis was performed following the standard protocol during bolus administration of intravenous contrast.  Contrast: OMNIPAQUE IOHEXOL 300 MG/ML  SOLN  Comparison: CT 01/05/2006  Findings: Lung bases are clear.  No pericardial fluid.  No focal hepatic lesion.  The gallbladder, pancreas, spleen, adrenal glands, kidneys are normal.  There is  a cortical scarring of the right kidney.  No hydronephrosis.  The stomach, small bowel, appendix, cecum are normal.  The colon is normal to the level of the descending colon.  There is a lobular mass at the vaginal cuff measuring 7.0 x 3.2 cm in axial dimension and approximately 4 cm craniocaudad dimension. There is a mass compresses the distal aspect of the sigmoid colon just above the rectum (image 62, axial, image 57, coronal series). No mass approximates the posterior wall the bladder without clear invasion .There is no evidence of obstruction of stool proximal to this mass compressing the sigmoid colon.  There is a large necrotic lymph node measuring 2.8 cm within the sigmoid mesocolon centrally (60).  Patient status post hysterectomy and oophorectomy.  Small bilateral external iliac lymph nodes.  For example 8 mm node on the right and 5 mm node on the left (image 59).  There is no periaortic adenopathy.  No periportal or mesenteric adenopathy  Review of the bone windows demonstrates post lumbar  fusion.  No aggressive osseous lesions.  IMPRESSION:  1.  Lobular mass at the vaginal cuff is consistent with endometrial carcinoma progression.  2.  Mass extends to the serosal surface of the sigmoid colon and compresses the lumen of the sigmoid colon.  No obstruction at this time.  3.  Mass  approximates the posterior wall of the bladder without clear invasion.  4. Large necrotic lymph node within the sigmoid mesocolon superior to the vaginal cuff.  5.  Small bilateral iliac lymph nodes.    Original Report Authenticated By: Genevive Bi, M.D.   Nm Pet Image Restag (ps) Skull Base To Thigh  05/09/2013   *RADIOLOGY REPORT*  Clinical Data: Subsequent treatment strategy for endometrial cancer. Hysterectomy 12/19/2011.  Recurrent pelvic masses on the CT.  NUCLEAR MEDICINE PET SKULL BASE TO THIGH  Fasting Blood Glucose:  141  Technique:  18.3 mCi F-18 FDG was injected intravenously. CT data was obtained and used for attenuation correction and anatomic localization only.  (This was not acquired as a diagnostic CT examination.) Additional exam technical data entered on technologist worksheet.  Comparison:  Abdominal pelvic CT 05/05/2013.  Findings:  Neck: No hypermetabolic lymph nodes in the neck.  Chest:  There are no hypermetabolic mediastinal or hilar lymph nodes.  There is no abnormal metabolic activity within the lungs. CT images demonstrate mild atherosclerosis.  There are no suspicious pulmonary findings.  Abdomen/Pelvis:  There is no abnormal metabolic activity within the liver, adrenal glands, spleen or pancreas.  No hypermetabolic lymph nodes are identified within the abdomen.  However, the large lobulated mass contiguous with the vaginal cuff is hypermetabolic. This demonstrates an SUV max of approximately 24.5.  The mass measures up to 6.8 x 3.6 cm transverse on image 186.  The necrotic nodal mass within the sigmoid the mesocolon is also hypermetabolic. This measures 3.1 cm on image 175 and demonstrates an SUV max of 17.1.  The right external iliac and pelvic sidewall lymph nodes demonstrated on the recent CT are also hypermetabolic.  The largest node measures 2.0 cm on image 176 and has an SUV max of 17.1.  No hypermetabolic left pelvic side wall nodes are identified.  There is no generalized peritoneal hypermetabolic activity or ascites. Scattered activity in the colon is likely physiologic.  The right kidney demonstrates lobularity and scarring.  Skeleton:  No focal hypermetabolic  activity to suggest skeletal metastasis.  IMPRESSION:  1.  Multifocal recurrence within the pelvis involving the  vaginal cuff, mesenteric and right pelvic sidewall lymph nodes. 2.  No distant metastases identified.   Original Report Authenticated By: Carey Bullocks, M.D.   Ct Biopsy  05/14/2013   *RADIOLOGY REPORT*  Clinical history:67 year old with history of endometrial cancer and suspicious lesions in the pelvis.  PROCEDURE(S): CT GUIDED BIOPSY OF PELVIC MASS  Physician: Rachelle Hora. Henn, MD  Medications:Versed 2 mg, Fentanyl 100 mcg.  A radiology nurse monitored the patient for moderate sedation.  Moderate sedation time:28 minutes  Procedure:The procedure was explained to the patient.  The risks and benefits of the procedure were discussed and the patient's questions were addressed.  Informed consent was obtained from the patient.  The patient was placed prone on the CT scanner.  Images of the pelvis were obtained.  The left gluteal area was prepped and draped in a sterile fashion.  Skin and soft tissues were anesthetized with lidocaine.  17 gauge needle was directed into the pelvic mass from a left transgluteal approach.  Needle position confirmed along the posterior aspect of the lesion.  Three core biopsies were obtained with an 18 gauge device.  Samples placed in formalin.  17 gauge needle was removed without complication.  Findings:There is a pelvic mass situated adjacent to the vaginal cuff.  The needle was positioned along the posterior aspect of the lesion.  Complications: None  Impression:CT guided core biopsies of the pelvic mass.   Original Report Authenticated By: Richarda Overlie, M.D.    ASSESSMENT: 67 year old female with  #1 recurrent endometrioid adenocarcinoma of the uterus. Patient is received palliative chemotherapy consisting of Taxol and carboplatinum for 6 cycles.   #2 patient underwent Port-A-Cath placement by interventional radiology and underwent chemotherapy teaching class.   #3  patient was begun on Taxol and carboplatinum starting on 06/03/2013. Today is cycle 6 day 8.  #4 pain control patient is on fentanyl patches as well as oxycodone. Her pain is really well controlled, and she takes the oxycodone very infrequently  PLAN:  #1 Patient has had an interval response to her treatment.  She will take December off and return in January for Taxotere/Carbo.    #2 She is anemic.  She has a hemoglobin of 7.3 but is asymptomatic.  I gave her information about anemia and when to call.    #3 She will return the week of January 12 for Taxotere/Carbo.  I also requested follow up with Dr. Nelly Rout as well.    All questions were answered. The patient knows to call the clinic with any problems, questions or concerns. We can certainly see the patient much sooner if necessary.  I spent 25 minutes counseling the patient face to face. The total time spent in the appointment was 30 minutes.  Illa Level, NP Medical Oncology [facility] [phone]   ATTENDING'S ATTESTATION:  I personally reviewed patient's chart, examined patient myself, formulated the treatment plan as followed.    Patient had staging studies performed I went over the results. She's had some response to chemotherapy. She's received 6 cycles of Taxol and carboplatinum. We will refer her to Dr. Nelly Rout. There is a good possibility that we may have to continue chemotherapy. My recommendation would be Taxotere and carboplatinum for another few cycles. However I would like Dr. Forrestine Him opinion prior to instituting therapy.  Drue Second, MD Medical/Oncology [facility] [phone] (beeper) [phone] (Office)  10/20/2013, 12:45 AM

## 2013-10-16 ENCOUNTER — Telehealth: Payer: Self-pay | Admitting: *Deleted

## 2013-10-16 NOTE — Telephone Encounter (Signed)
appts made and printed. Pt is aware that tx will be added. i emailed MW to add the tx...td 

## 2013-10-16 NOTE — Telephone Encounter (Signed)
Per staff message and POF I have scheduled appts.  JMW  

## 2013-10-28 ENCOUNTER — Ambulatory Visit: Payer: Medicare Other | Attending: Gynecologic Oncology | Admitting: Gynecologic Oncology

## 2013-10-28 VITALS — BP 105/61 | HR 74 | Temp 98.7°F | Resp 16 | Ht 60.0 in | Wt 151.7 lb

## 2013-10-28 DIAGNOSIS — I209 Angina pectoris, unspecified: Secondary | ICD-10-CM | POA: Insufficient documentation

## 2013-10-28 DIAGNOSIS — C549 Malignant neoplasm of corpus uteri, unspecified: Secondary | ICD-10-CM | POA: Insufficient documentation

## 2013-10-28 DIAGNOSIS — Z90710 Acquired absence of both cervix and uterus: Secondary | ICD-10-CM | POA: Insufficient documentation

## 2013-10-28 DIAGNOSIS — I700 Atherosclerosis of aorta: Secondary | ICD-10-CM | POA: Insufficient documentation

## 2013-10-28 DIAGNOSIS — Z9079 Acquired absence of other genital organ(s): Secondary | ICD-10-CM | POA: Insufficient documentation

## 2013-10-28 DIAGNOSIS — C541 Malignant neoplasm of endometrium: Secondary | ICD-10-CM

## 2013-10-28 DIAGNOSIS — E119 Type 2 diabetes mellitus without complications: Secondary | ICD-10-CM | POA: Insufficient documentation

## 2013-10-28 DIAGNOSIS — Z9221 Personal history of antineoplastic chemotherapy: Secondary | ICD-10-CM | POA: Insufficient documentation

## 2013-10-28 DIAGNOSIS — L918 Other hypertrophic disorders of the skin: Secondary | ICD-10-CM | POA: Insufficient documentation

## 2013-10-28 NOTE — Progress Notes (Signed)
Office Visit:  GYN ONCOLOGY   Chief Complaint:  Recurrent endometrial cancer  Assessment:   81) 67 y.o. 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  Her postop course has been complicated by persistent granulation tissue.   04/2013 vaginal examination is notable for a nodule in the vagina this was biopsied. A rectal examination firm 8 cm mass was noted.  Biopsy c/w recurrent endometroid cancer.  Pam Vazquez has received 6 cycles taxol/carbo.  Repeat CT 4 weeks after cycle 6 notabkle for interval decrease in tumor burden  Rec additional 3 cycles of treatment with imaging to assess for persistent disease.  F/U in 3 months    HPI:  Pam Vazquez is a 67 y.o. year old No obstetric history on file. initially seen in consultation on 11/27/2011 at the request of Dr. Duane Lope for evaluation of a grade 1 endometrial adenocarcinoma..  She then underwent a robotic-assisted total laparoscopic hysterectomy bilateral salpingo-oophorectomy right pelvic lymph node biopsy on 12/19/2011 without complications.  Her final pathologic diagnosis is a Stage Ia Grade 1 endometrioid endometrial cancer with no evidence of lymphovascular space invasion, 5/16 mm (31 %) of myometrial invasion and negative lymph nodes.  Pam Vazquez   presented to Dr. Lodema Hong in October of 2013. A Pap test at that time was within normal limits and granulation tissue was removed from the cuff.     Subsequent visits to Dr. Greta Doom in November December and January were all notable for the application of silver nitrate for management of the persistent vaginal cuff granulation tissue.  At a visit 04/29/2013 granulation tissue was noted and a pelic mass was appreciated and biopsied.   Diagnosis Vagina, biopsy, apex - SLIGHTLY INFLAMED SQUAMOUS EPITHELIUM. - NO EVIDENCE OF DYSPLASIA OR MALIGNANCY.  CT  05/05/2013 1. Lobular mass at the vaginal cuff is consistent with endometrial carcinoma  progression. 2. Mass extends to the serosal surface of the sigmoid colon and compresses the lumen of the sigmoid colon. No obstruction at this time.  3. Mass approximates the posterior wall of the bladder without clear invasion. 4. Large necrotic lymph node within the sigmoid mesocolon superior to the vaginal cuff. 5. Small bilateral iliac lymph nodes  CT guided bx 05/2013 Pelvis, biopsy, Central lower - METASTATIC CARCINOMA WITH ASSOCIATED TUMOR NECROSIS.  Pam Vazquez has received 6 cycles taxol/Carboplatin completed 09/2013  CT 10/2013 Enhancing soft tissue at the left side of vaginal cuff measures 2.7 cm, image 97/series 2. This is compared with 3.5 cm previously. Calcified atherosclerotic disease involves the abdominal aorta. No aneurysm. No enlarged upper abdominal lymph nodes. Right external iliac lymph node measures 1.4 cm, previously 1.8 cm. Peritoneal nodule within the central pelvis measures 1.5 cm, previously 2.1 cm. The stomach appears normal. The small bowel loops have a normal course and caliber. No obstruction.   Feels well.  Denies vaginal or rectal  bleeding.  Past Medical History  Diagnosis Date  . Diabetes mellitus   . PONV (postoperative nausea and vomiting)   . Cancer     endometrial  . Angina     stress test on chart from 3/12/ none since March 2012  . Chronic kidney disease     kidney stones  . Hypertension     / EKG 1/13 Epic   Past Surgical History  Procedure Laterality Date  . Hysteroscopy w/d&c  11/21/2011    Procedure: DILATATION AND CURETTAGE /HYSTEROSCOPY;  Surgeon: Miguel Aschoff;  [facility]: [facility];  Service: Gynecology;  Laterality: N/A;  . Tubal ligation    . Breast mass removal  1984, 1992    L breast  . Lithotripsy  2000, 2002  . Back surgery      91 and 2001  . Dilation and curettage of uterus    . Node dissection  12/19/2011    Procedure: NODE DISSECTION;  Surgeon: Laurette Schimke, MD PHD;  Location: [facility];  Service: Gynecology;  Laterality: N/A;    Social Hx.    her husband died earlier this year  Review of systems: Constitutional:  She has no weight gain or weight loss. She has no fever or chills. Eyes: No blurred vision Ears, Nose, Mouth, Throat: No dizziness, headaches or changes in hearing.  Cardiovascular: No chest pain, palpitations or edema. Respiratory:  No shortness of breath, wheezing or cough Gastrointestinal: She has normal bowel movements without diarrhea or constipation. She denies any nausea or vomiting. She denies blood in her stool or heart burn.  Genitourinary:  She denies pelvic pain, pelvic pressure or changes in her urinary function. She has no hematuria, dysuria, or incontinence. She has no vaginal bleeding,  Musculoskeletal: Denies muscle weakness or joint pains.  Skin:  She has no skin changes, rashes or itching Neurological:  Denies dizziness or headaches. No neuropathy, no numbness or tingling. Hematologic/Lymphatic:   No easy bruising or bleeding   Physical Exam: BP 105/61  Pulse 74  Temp(Src) 98.7 F (37.1 C) (Oral)  Resp 16  Ht 5' (1.524 m)  Wt 151 lb 11.2 oz (68.811 kg)  BMI 29.63 kg/m2 General: Well dressed, well nourished in no apparent distress.   HEENT:  Normocephalic and atraumatic, no lesions.  Extraocular muscles intact. Sclerae anicteric Skin:  No lesions or rashes. Lungs:  Clear to auscultation bilaterally.  No wheezes. Cardiovascular:  Regular rate and rhythm.  No murmurs or rubs. Abdomen:  Soft, nontender, nondistended.  No palpable masses.  No hepatosplenomegaly.  No ascites. Normal bowel sounds.  No hernias or tenderness at the laparoscopic trocar sites. Genitourinary: Normal EGBUS  Vaginal cuff intact.  No bleeding or discharge.   firmness at the cuff.  No gross disease Rectal: Good tone, no masses.anterior rectal nodularity Extremities: No cyanosis, clubbing or edema.  No calf tenderness or erythema. No palpable cords. Lymphatics:  No palpable supraclavicular, cervical, axillary  or inguinal lymphadenopathy. Psychiatric: Mood and affect are appropriate. Neurological: Awake, alert and oriented x 3. Sensation is intact,  Musculoskeletal: No pain, normal strength and range of motion.

## 2013-10-28 NOTE — Patient Instructions (Signed)
Happy Holidays F/U in 3 months    Thank you very much Ms. Pam Vazquez for allowing me to provide care for you today.  I appreciate your confidence in choosing our Gynecologic Oncology team.  If you have any questions about your visit today please call our office and we will get back to you as soon as possible.  [name]. Sammie Schermerhorn MD., PhD Gynecologic Oncology

## 2013-11-17 ENCOUNTER — Other Ambulatory Visit: Payer: Self-pay | Admitting: Emergency Medicine

## 2013-11-17 DIAGNOSIS — C541 Malignant neoplasm of endometrium: Secondary | ICD-10-CM

## 2013-11-17 DIAGNOSIS — R109 Unspecified abdominal pain: Secondary | ICD-10-CM

## 2013-11-17 MED ORDER — FENTANYL 25 MCG/HR TD PT72: 25.0000 ug | MEDICATED_PATCH | TRANSDERMAL | Status: DC

## 2013-11-25 ENCOUNTER — Other Ambulatory Visit (HOSPITAL_BASED_OUTPATIENT_CLINIC_OR_DEPARTMENT_OTHER): Payer: Medicare Other

## 2013-11-25 ENCOUNTER — Encounter: Payer: Self-pay | Admitting: Adult Health

## 2013-11-25 ENCOUNTER — Ambulatory Visit (HOSPITAL_BASED_OUTPATIENT_CLINIC_OR_DEPARTMENT_OTHER): Payer: Medicare Other | Admitting: Adult Health

## 2013-11-25 ENCOUNTER — Ambulatory Visit: Payer: Medicare Other

## 2013-11-25 ENCOUNTER — Telehealth: Payer: Self-pay | Admitting: *Deleted

## 2013-11-25 ENCOUNTER — Telehealth: Payer: Self-pay | Admitting: Oncology

## 2013-11-25 VITALS — BP 144/77 | HR 114 | Temp 98.8°F | Resp 18 | Ht 60.0 in | Wt 141.7 lb

## 2013-11-25 DIAGNOSIS — C549 Malignant neoplasm of corpus uteri, unspecified: Secondary | ICD-10-CM

## 2013-11-25 DIAGNOSIS — N898 Other specified noninflammatory disorders of vagina: Secondary | ICD-10-CM

## 2013-11-25 DIAGNOSIS — C541 Malignant neoplasm of endometrium: Secondary | ICD-10-CM

## 2013-11-25 DIAGNOSIS — G609 Hereditary and idiopathic neuropathy, unspecified: Secondary | ICD-10-CM

## 2013-11-25 LAB — CBC WITH DIFFERENTIAL/PLATELET
BASO%: 0.1 % (ref 0.0–2.0)
Basophils Absolute: 0 10*3/uL (ref 0.0–0.1)
EOS%: 0 % (ref 0.0–7.0)
Eosinophils Absolute: 0 10*3/uL (ref 0.0–0.5)
HCT: 36.4 % (ref 34.8–46.6)
HGB: 12.2 g/dL (ref 11.6–15.9)
LYMPH%: 10.8 % — ABNORMAL LOW (ref 14.0–49.7)
MCH: 34.8 pg — AB (ref 25.1–34.0)
MCHC: 33.5 g/dL (ref 31.5–36.0)
MCV: 103.7 fL — AB (ref 79.5–101.0)
MONO#: 0.1 10*3/uL (ref 0.1–0.9)
MONO%: 0.8 % (ref 0.0–14.0)
NEUT#: 6.7 10*3/uL — ABNORMAL HIGH (ref 1.5–6.5)
NEUT%: 88.3 % — ABNORMAL HIGH (ref 38.4–76.8)
NRBC: 0 % (ref 0–0)
Platelets: 215 10*3/uL (ref 145–400)
RBC: 3.51 10*6/uL — ABNORMAL LOW (ref 3.70–5.45)
RDW: 12.7 % (ref 11.2–14.5)
WBC: 7.6 10*3/uL (ref 3.9–10.3)
lymph#: 0.8 10*3/uL — ABNORMAL LOW (ref 0.9–3.3)

## 2013-11-25 NOTE — Telephone Encounter (Signed)
gv pt appt schedule for january thru march including appt w/dr brewster.

## 2013-11-25 NOTE — Telephone Encounter (Signed)
Patient left voicemail yesterday asking if she was due to get injection on day 2 of new chemo, this nurse was unable to discuss with Charlestine Massed, NP before end of work day on 1/12, however patient has appt on 1/13 and we can arrange for injection at that time if needed.

## 2013-11-25 NOTE — Progress Notes (Addendum)
OFFICE PROGRESS NOTE  CC  [name],[name], MD [address] [place] [address] Dr. Dyke Maes [name] 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 She had undergone a robotic assisted total laparoscopic hysterectomy bilateral salpingo-oophorectomy right pelvic lymph node biopsy on 12/19/2011 without any complications. 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 There was no involvement of other organs lymphovascular invasion was not identified. 3 lymph nodes were negative for metastatic disease. 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 Patient subsequently continued to be seen by gynecologic oncology. In October 2013 patient had a Pap smear that was normal limits there was noted to be some granulation tissue that was removed from the cough. There is no evidence of disease.  #2patient was seen by Dr. Dahlia Client on 04/29/2013 she had vaginal exam performed that was notable for a nodule in the vagina concerning for recurrence. A rectal examination revealed a firm 8 cm mass also. Because of this she had a biopsy performed. She also had a CT scan of the abdomen and pelvis ordered to further evaluate the sigmoid mass. The pathology came back inconclusive. However the CT of the abdomen and pelvis showed a lobular mass at the vagina cuff measuring 7.0 x 3.2 cm proximally 4 cm craniocaudal dimension. The mass compressed the distal aspect of the sigmoid colon just above the rectum there was no mass approximates the posterior wall of the bladder without clear invasion noted evidence of  obstruction of stool proximal to this mass. There was also noted to be a large necrotic lymph node measuring 2.8 cm within the sigmoid mesocolon centrally. There are also small bilateral external iliac lymph nodes. There were no aggressive osseous lesions.   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  #4 patient was begun on palliative chemotherapy starting on 06/03/2013 with Taxol carboplatinum. Total of 6 cycles of treatment was planned and completed on 09/23/13.  #5 pain control: She is being managed with fentanyl patches and oxycodone.  CURRENT THERAPY: Taxotere/Carbo to start on November 27, 2013  INTERVAL HISTORY: SAVAYA RUSTON 68 y.o. female returns for followup prior to taxotere/carboplatin.  She took Dexamethasone twice a day yesterday as directed.  She also had some soreness in her left knee and received a cortisone shot on 12/31.  She is concerned because she experienced vaginal spotting on 1/11 x1, vaginal discharge on 1/3-1/11, that was described as pale yellow, that had an odor that she could not describe.  She has had mild lower back discomfort, and hasn't taken oxycodone in 2 weeks, she has been taking this is a new pain since stopping chemotherapy, it is worse in the left side of her buttocks, however it is relieved with tylenol.  She does endorse constipation and takes stool softeners for this.  She is also c/o feeling jittery, this has been going on for a couple of weeks, it happens twice a day, with a nonspecific pattern. She doesn't drink caffeinated beverages.  Her neuropathy is stable and she continues to take Gabapentin TID.  Otherwise, she denies fevers, chills, nausea, vomiting, diarrhea, dysuria,  or any further concerns.    MEDICAL HISTORY: Past Medical History  Diagnosis Date  . Diabetes mellitus   . PONV (postoperative nausea and vomiting)   . Cancer     endometrial  .  Angina     stress test on chart from 3/12/ none since March 2012  . Chronic kidney disease     kidney stones  . Hypertension     / EKG 1/13 Epic    ALLERGIES:  has No Known Allergies.  MEDICATIONS:  Current Outpatient Prescriptions  Medication Sig Dispense Refill  . amLODipine (NORVASC) 2.5 MG tablet Take 2.5 mg by mouth every morning.       . cyanocobalamin 2000 MCG tablet Take 2,000 mcg by mouth 2 (two) times daily.      [name] dexamethasone (DECADRON) 4 MG tablet Take 2 tablets (8 mg total) by mouth 2 (two) times daily with a meal. Take two times a day starting the day after chemotherapy for 3 days.  30 tablet  1  . doxycycline (VIBRA-TABS) 100 MG tablet Take 100 mg by mouth every other day.      . fentaNYL (DURAGESIC - DOSED MCG/HR) 25 MCG/HR patch Place 1 patch (25 mcg total) onto the skin every 3 (three) days.  10 patch  0  . gabapentin (NEURONTIN) 100 MG capsule Take 2 capsules (200 mg total) by mouth 3 (three) times daily.  180 capsule  1  . lidocaine-prilocaine (EMLA) cream Apply topically as needed.  30 g  6  . lisinopril-hydrochlorothiazide (PRINZIDE,ZESTORETIC) 20-25 MG per tablet Take 1 tablet by mouth daily after breakfast.       . LORazepam (ATIVAN) 0.5 MG tablet Take 1 tablet (0.5 mg total) by mouth every 6 (six) hours as needed (Nausea or vomiting).  30 tablet  0  . metFORMIN (GLUCOPHAGE) 500 MG tablet Take 500 mg by mouth 2 (two) times daily.       . metoprolol tartrate (LOPRESSOR) 25 MG tablet Take 25 mg by mouth 2 (two) times daily.       . Multiple Vitamin (MULTIVITAMIN WITH MINERALS) TABS Take 1 tablet by mouth every morning. She takes Dance movement psychotherapist.      [name] omeprazole (PRILOSEC) 40 MG capsule Take 1 capsule (40 mg total) by mouth daily.  30 capsule  6  . ondansetron (ZOFRAN) 8 MG tablet Take 1 tablet (8 mg total) by mouth 2 (two) times daily. Take two times a day starting the day after chemo for 3 days. Then take two times a day as needed for nausea or vomiting.  30 tablet   1  . loratadine (CLARITIN) 10 MG tablet Take 10 mg by mouth daily. Take as needed      . prochlorperazine (COMPAZINE) 10 MG tablet Take 1 tablet (10 mg total) by mouth every 6 (six) hours as needed (Nausea or vomiting).  30 tablet  1   No current facility-administered medications for this visit.    SURGICAL HISTORY:  Past Surgical History  Procedure Laterality Date  . Hysteroscopy w/d&c  11/21/2011    Procedure: DILATATION AND CURETTAGE /HYSTEROSCOPY;  Surgeon: Gus Height;  Location: [facility];  Service: Gynecology;  Laterality: N/A;  . Tubal ligation    . Breast mass removal  1984, 1992    L breast  . Lithotripsy  2000, 2002  . Back surgery      91 and 2001  . Dilation and curettage of uterus    . Node dissection  12/19/2011  Procedure: NODE DISSECTION;  Surgeon: [name], MD PHD;  Location: [facility];  Service: Gynecology;  Laterality: N/A;    REVIEW OF SYSTEMS:  A 10 point review of systems was conducted and is otherwise negative except for what is noted above.    PHYSICAL EXAMINATION: Blood pressure 144/77, pulse 114, temperature 98.8 F (37.1 C), temperature source Oral, resp. rate 18, height 5' (1.524 m), weight 141 lb 11.2 oz (64.275 kg). Body mass index is 27.67 kg/(m^2). GENERAL: Patient is a well appearing female in no acute distress HEENT:  Sclerae anicteric.  Oropharynx clear and moist. No ulcerations or evidence of oropharyngeal candidiasis. Neck is supple.  NODES:  No cervical, supraclavicular, or axillary lymphadenopathy palpated.  BREAST EXAM:  Deferred. LUNGS:  Clear to auscultation bilaterally.  No wheezes or rhonchi. HEART:  Regular rate and rhythm. No murmur appreciated. ABDOMEN:  Soft, nontender.  Positive, normoactive bowel sounds. No organomegaly palpated. MSK:  No focal spinal tenderness to palpation. Full range of motion bilaterally in the upper extremities. EXTREMITIES:  No peripheral edema.   SKIN:  Clear with no obvious rashes or skin changes. No nail  dyscrasia. NEURO:  Nonfocal. Well oriented.  Appropriate affect. ECOG PERFORMANCE STATUS: 0 - Asymptomatic  LABORATORY DATA: Lab Results  Component Value Date   WBC 7.6 11/25/2013   HGB 12.2 11/25/2013   HCT 36.4 11/25/2013   MCV 103.7* 11/25/2013   PLT 215 11/25/2013      Chemistry      Component Value Date/Time   NA 142 10/15/2013 1308   NA 141 12/20/2011 0334   K 3.7 10/15/2013 1308   K 4.1 12/20/2011 0334   CL 104 04/29/2013 1229   CL 104 12/20/2011 0334   CO2 27 10/15/2013 1308   CO2 27 12/20/2011 0334   BUN 15.8 10/15/2013 1308   BUN 10 12/20/2011 0334   CREATININE 1.0 10/15/2013 1308   CREATININE 0.80 12/20/2011 0334      Component Value Date/Time   CALCIUM 10.7* 10/15/2013 1308   CALCIUM 9.5 12/20/2011 0334   ALKPHOS 59 10/15/2013 1308   ALKPHOS 69 12/15/2011 1125   AST 18 10/15/2013 1308   AST 22 12/15/2011 1125   ALT 13 10/15/2013 1308   ALT 19 12/15/2011 1125   BILITOT 0.62 10/15/2013 1308   BILITOT 0.8 12/15/2011 1125    ADDITIONAL INFORMATION: The malignant cells are positive for estrogen receptor, progesterone receptor, cytokeratin 7 and focally positive for CDX-2. They are negative for cytokeratin 20. Given the clinical history as well as the histologic features, the findings are consistent with metastatic endometrioid adenocarcinoma. (JBK:kh 05-19-13) Enid Cutter MD Pathologist, Electronic Signature ( Signed 05/19/2013) FINAL DIAGNOSIS Diagnosis Pelvis, biopsy, Central lower - METASTATIC CARCINOMA WITH ASSOCIATED TUMOR NECROSIS. Microscopic Comment Immunostains will be performed and an addendum report will follow. (HCL:kh 05-15-13) Aldona Bar MD Pathologist, Electronic Signature (Case signed 05/15/2013) Specimen Gross and Clinical Information Specimen(s) Obtained: Pelvis, biopsy, Central lower Specimen Clinical Information Recurrent cancer (tl) Gross Received in formalin are fragments and cores of soft white tissue having an aggregate measurement of 1.3 x 0.5 x 0.1 cm. The  specimen is submitted in toto. (GRP: [id number] [id number]) 1 of 2 Duplicate copy FINAL for Schellinger, Lailie P [id number]) Stain(s) used in Diagnosis: The following stain(s) were used in diagnosing the case: PR - NOACIS, ER - NOACIS, CK-7, CDX-2, CK 20. The control(s) stained appropriately. Disc   RADIOGRAPHIC STUDIES:  Dg Chest 2 View  05/06/2013   *RADIOLOGY REPORT*  Clinical  Data: Endometrial cancer  CHEST - 2 VIEW  Comparison: December 15, 2011.  Findings: Cardiomediastinal silhouette appears normal.  No acute pulmonary disease is noted.  Bony thorax is intact.  Stable small dense nodule seen in right lower lobe compared to prior exam most consistent with granuloma.  IMPRESSION: Stable small nodule seen in right lower lobe most consistent with granuloma.  No acute cardiopulmonary abnormality seen.   Original Report Authenticated By: Marijo Conception.,  M.D.   Ct Abdomen Pelvis W Contrast  05/05/2013   *RADIOLOGY REPORT*  Clinical Data: Pelvic pain.  History of endometrial carcinoma.  CT ABDOMEN AND PELVIS WITH CONTRAST  Technique:  Multidetector CT imaging of the abdomen and pelvis was performed following the standard protocol during bolus administration of intravenous contrast.  Contrast: 115mL OMNIPAQUE IOHEXOL 300 MG/ML  SOLN  Comparison: CT 01/05/2006  Findings: Lung bases are clear.  No pericardial fluid.  No focal hepatic lesion.  The gallbladder, pancreas, spleen, adrenal glands, kidneys are normal.  There is a cortical scarring of the right kidney.  No hydronephrosis.  The stomach, small bowel, appendix, cecum are normal.  The colon is normal to the level of the descending colon.  There is a lobular mass at the vaginal cuff measuring 7.0 x 3.2 cm in axial dimension and approximately 4 cm craniocaudad dimension. There is a mass compresses the distal aspect of the sigmoid colon just above the rectum (image 62, axial, image 57, coronal series). No mass approximates the posterior wall the bladder without  clear invasion .There is no evidence of obstruction of stool proximal to this mass compressing the sigmoid colon.  There is a large necrotic lymph node measuring 2.8 cm within the sigmoid mesocolon centrally (60).  Patient status post hysterectomy and oophorectomy.  Small bilateral external iliac lymph nodes.  For example 8 mm node on the right and 5 mm node on the left (image 59).  There is no periaortic adenopathy.  No periportal or mesenteric adenopathy  Review of the bone windows demonstrates post lumbar  fusion.  No aggressive osseous lesions.  IMPRESSION:  1.  Lobular mass at the vaginal cuff is consistent with endometrial carcinoma progression.  2.  Mass extends to the serosal surface of the sigmoid colon and compresses the lumen of the sigmoid colon.  No obstruction at this time.  3.  Mass  approximates the posterior wall of the bladder without clear invasion.  4. Large necrotic lymph node within the sigmoid mesocolon superior to the vaginal cuff.  5.  Small bilateral iliac lymph nodes.   Original Report Authenticated By: Suzy Bouchard, M.D.   Nm Pet Image Restag (ps) Skull Base To Thigh  05/09/2013   *RADIOLOGY REPORT*  Clinical Data: Subsequent treatment strategy for endometrial cancer. Hysterectomy 12/19/2011.  Recurrent pelvic masses on the CT.  NUCLEAR MEDICINE PET SKULL BASE TO THIGH  Fasting Blood Glucose:  141  Technique:  18.3 mCi F-18 FDG was injected intravenously. CT data was obtained and used for attenuation correction and anatomic localization only.  (This was not acquired as a diagnostic CT examination.) Additional exam technical data entered on technologist worksheet.  Comparison:  Abdominal pelvic CT 05/05/2013.  Findings:  Neck: No hypermetabolic lymph nodes in the neck.  Chest:  There are no hypermetabolic mediastinal or hilar lymph nodes.  There is no abnormal metabolic activity within the lungs. CT images demonstrate mild atherosclerosis.  There are no suspicious pulmonary findings.   Abdomen/Pelvis:  There is no abnormal metabolic activity  within the liver, adrenal glands, spleen or pancreas.  No hypermetabolic lymph nodes are identified within the abdomen.  However, the large lobulated mass contiguous with the vaginal cuff is hypermetabolic. This demonstrates an SUV max of approximately 24.5.  The mass measures up to 6.8 x 3.6 cm transverse on image 186.  The necrotic nodal mass within the sigmoid the mesocolon is also hypermetabolic. This measures 3.1 cm on image 175 and demonstrates an SUV max of 17.1.  The right external iliac and pelvic sidewall lymph nodes demonstrated on the recent CT are also hypermetabolic.  The largest node measures 2.0 cm on image 176 and has an SUV max of 17.1.  No hypermetabolic left pelvic side wall nodes are identified.  There is no generalized peritoneal hypermetabolic activity or ascites. Scattered activity in the colon is likely physiologic.  The right kidney demonstrates lobularity and scarring.  Skeleton:  No focal hypermetabolic activity to suggest skeletal metastasis.  IMPRESSION:  1.  Multifocal recurrence within the pelvis involving the vaginal cuff, mesenteric and right pelvic sidewall lymph nodes. 2.  No distant metastases identified.   Original Report Authenticated By: Richardean Sale, M.D.   Ct Biopsy  05/14/2013   *RADIOLOGY REPORT*  Clinical history:68 year old with history of endometrial cancer and suspicious lesions in the pelvis.  PROCEDURE(S): CT GUIDED BIOPSY OF PELVIC MASS  Physician: Stephan Minister. Henn, MD  Medications:Versed 2 mg, Fentanyl 100 mcg.  A radiology nurse monitored the patient for moderate sedation.  Moderate sedation time:28 minutes  Procedure:The procedure was explained to the patient.  The risks and benefits of the procedure were discussed and the patient's questions were addressed.  Informed consent was obtained from the patient.  The patient was placed prone on the CT scanner.  Images of the pelvis were obtained.  The left  gluteal area was prepped and draped in a sterile fashion.  Skin and soft tissues were anesthetized with lidocaine.  40 gauge needle was directed into the pelvic mass from a left transgluteal approach.  Needle position confirmed along the posterior aspect of the lesion.  Three core biopsies were obtained with an 18 gauge device.  Samples placed in formalin.  17 gauge needle was removed without complication.  Findings:There is a pelvic mass situated adjacent to the vaginal cuff.  The needle was positioned along the posterior aspect of the lesion.  Complications: None  Impression:CT guided core biopsies of the pelvic mass.   Original Report Authenticated By: Markus Daft, M.D.    ASSESSMENT: 68 year old female with  #1 recurrent endometrioid adenocarcinoma of the uterus. Patient is received palliative chemotherapy consisting of Taxol and carboplatinum for 6 cycles.   #2 patient underwent Port-A-Cath placement by interventional radiology and underwent chemotherapy teaching class.   #3 patient was begun on Taxol and carboplatinum starting on 06/03/2013. She completed this on 09/23/13 after 6 cycles and underwent a CT chest abdomen pelvis on 10/14/14 that demonstrated an interval response to treatment.  She saw Dr. [name] on 10/28/13 who recommended an additional three cycles of treatment, followed by imaging and f/u with her in 3 months.    #4 pain control patient is on fentanyl patches as well as oxycodone. Her pain is really well controlled, and she takes the oxycodone very infrequently  PLAN:  #1 Patient has had an interval response to her treatment.  She saw Dr. [name] in December who recommended 3 additional cycles of therapy.  She will proceed with Taxotere Carbo on 11/27/13.  This will be her seventh  dose of Carbo and she will need a test dose prior to receiving the Carboplatin.    #2 Her CBC is stable.  I reviewed it with her in detail.    #3  She will f/u with Dr. [name] regarding her vaginal  spotting and discharge.  She was also recommended f/u with a dentist.    #4  She will continue Gabapentin for her neuropathy.    #5 She will return on Thursday 11/27/13 for labs (CMET for Carbo dosing) and chemotherapy, on Friday, 11/28/13 for Neulasta, and a week later for labs, and evaluation for chemotoxicities.      All questions were answered. The patient knows to call the clinic with any problems, questions or concerns. We can certainly see the patient much sooner if necessary.  I spent 40 minutes counseling the patient face to face. The total time spent in the appointment was 60 minutes.  Minette Headland, [place] [phone] 11/26/2013, 5:35 PM  ATTENDING'S ATTESTATION:  I personally reviewed patient's chart, examined patient myself, formulated the treatment plan as followed.    Doing well. We discussed scan results. She will proceed with more chemotherapy consisting of Taxotere and cytoxan every 3 weeks with day 2 neulasta. We willbegin this on Thursday of this week.  Marcy Panning, MD Medical/Oncology [facility] [phone] (beeper) [phone] (Office)  12/06/2013, 3:30 PM

## 2013-11-25 NOTE — Telephone Encounter (Signed)
message to [facility] re d/t for f/u ordered for 1/22. LC out [facility] no availability. as of now pt as no appt for 1/22. see previous note. pt has scheduled for other appts.

## 2013-11-26 ENCOUNTER — Other Ambulatory Visit: Payer: Self-pay | Admitting: Emergency Medicine

## 2013-11-26 ENCOUNTER — Other Ambulatory Visit: Payer: Self-pay | Admitting: Oncology

## 2013-11-26 DIAGNOSIS — C541 Malignant neoplasm of endometrium: Secondary | ICD-10-CM

## 2013-11-27 ENCOUNTER — Other Ambulatory Visit: Payer: Self-pay | Admitting: Oncology

## 2013-11-27 ENCOUNTER — Other Ambulatory Visit: Payer: Self-pay | Admitting: Emergency Medicine

## 2013-11-27 ENCOUNTER — Telehealth: Payer: Self-pay | Admitting: Oncology

## 2013-11-27 ENCOUNTER — Other Ambulatory Visit (HOSPITAL_BASED_OUTPATIENT_CLINIC_OR_DEPARTMENT_OTHER): Payer: Medicare Other

## 2013-11-27 ENCOUNTER — Ambulatory Visit (HOSPITAL_BASED_OUTPATIENT_CLINIC_OR_DEPARTMENT_OTHER): Payer: Medicare Other

## 2013-11-27 VITALS — BP 122/73 | HR 70 | Temp 98.1°F | Resp 18

## 2013-11-27 DIAGNOSIS — C549 Malignant neoplasm of corpus uteri, unspecified: Secondary | ICD-10-CM

## 2013-11-27 DIAGNOSIS — C541 Malignant neoplasm of endometrium: Secondary | ICD-10-CM

## 2013-11-27 DIAGNOSIS — Z5111 Encounter for antineoplastic chemotherapy: Secondary | ICD-10-CM

## 2013-11-27 LAB — COMPREHENSIVE METABOLIC PANEL (CC13)
ALK PHOS: 60 U/L (ref 40–150)
ALT: 15 U/L (ref 0–55)
AST: 15 U/L (ref 5–34)
Albumin: 3.9 g/dL (ref 3.5–5.0)
Anion Gap: 13 mEq/L — ABNORMAL HIGH (ref 3–11)
BILIRUBIN TOTAL: 0.31 mg/dL (ref 0.20–1.20)
BUN: 25.6 mg/dL (ref 7.0–26.0)
CO2: 24 mEq/L (ref 22–29)
Calcium: 10.3 mg/dL (ref 8.4–10.4)
Chloride: 105 mEq/L (ref 98–109)
Creatinine: 0.9 mg/dL (ref 0.6–1.1)
GLUCOSE: 196 mg/dL — AB (ref 70–140)
Potassium: 3.8 mEq/L (ref 3.5–5.1)
SODIUM: 142 meq/L (ref 136–145)
Total Protein: 6.7 g/dL (ref 6.4–8.3)

## 2013-11-27 LAB — CBC WITH DIFFERENTIAL/PLATELET
BASO%: 0 % (ref 0.0–2.0)
BASOS ABS: 0 10*3/uL (ref 0.0–0.1)
EOS%: 0 % (ref 0.0–7.0)
Eosinophils Absolute: 0 10*3/uL (ref 0.0–0.5)
HEMATOCRIT: 33.9 % — AB (ref 34.8–46.6)
HEMOGLOBIN: 11.2 g/dL — AB (ref 11.6–15.9)
LYMPH%: 9.8 % — ABNORMAL LOW (ref 14.0–49.7)
MCH: 34.7 pg — ABNORMAL HIGH (ref 25.1–34.0)
MCHC: 33 g/dL (ref 31.5–36.0)
MCV: 105 fL — ABNORMAL HIGH (ref 79.5–101.0)
MONO#: 0.2 10*3/uL (ref 0.1–0.9)
MONO%: 2.8 % (ref 0.0–14.0)
NEUT#: 7.6 10*3/uL — ABNORMAL HIGH (ref 1.5–6.5)
NEUT%: 87.4 % — AB (ref 38.4–76.8)
Platelets: 198 10*3/uL (ref 145–400)
RBC: 3.23 10*6/uL — ABNORMAL LOW (ref 3.70–5.45)
RDW: 12.7 % (ref 11.2–14.5)
WBC: 8.7 10*3/uL (ref 3.9–10.3)
lymph#: 0.9 10*3/uL (ref 0.9–3.3)
nRBC: 0 % (ref 0–0)

## 2013-11-27 MED ORDER — DEXAMETHASONE SODIUM PHOSPHATE 20 MG/5ML IJ SOLN
20.0000 mg | Freq: Once | INTRAMUSCULAR | Status: AC
  Administered 2013-11-27: 20 mg via INTRAVENOUS

## 2013-11-27 MED ORDER — DEXAMETHASONE 4 MG PO TABS: ORAL_TABLET | ORAL | Status: DC

## 2013-11-27 MED ORDER — CARBOPLATIN CHEMO INTRADERMAL TEST DOSE 100MCG/0.02ML
100.0000 ug | Freq: Once | INTRADERMAL | Status: AC
  Administered 2013-11-27: 100 ug via INTRADERMAL
  Filled 2013-11-27: qty 0.01

## 2013-11-27 MED ORDER — HEPARIN SOD (PORK) LOCK FLUSH 100 UNIT/ML IV SOLN
500.0000 [IU] | Freq: Once | INTRAVENOUS | Status: AC | PRN
  Administered 2013-11-27: 500 [IU]
  Filled 2013-11-27: qty 5

## 2013-11-27 MED ORDER — GABAPENTIN 100 MG PO CAPS: 200.0000 mg | ORAL_CAPSULE | Freq: Three times a day (TID) | ORAL | Status: DC

## 2013-11-27 MED ORDER — DEXAMETHASONE SODIUM PHOSPHATE 20 MG/5ML IJ SOLN
INTRAMUSCULAR | Status: AC
  Filled 2013-11-27: qty 5

## 2013-11-27 MED ORDER — ONDANSETRON 16 MG/50ML IVPB (CHCC)
INTRAVENOUS | Status: AC
  Filled 2013-11-27: qty 16

## 2013-11-27 MED ORDER — SODIUM CHLORIDE 0.9 % IV SOLN
Freq: Once | INTRAVENOUS | Status: AC
  Administered 2013-11-27: 13:00:00 via INTRAVENOUS

## 2013-11-27 MED ORDER — ONDANSETRON 16 MG/50ML IVPB (CHCC)
16.0000 mg | Freq: Once | INTRAVENOUS | Status: AC
  Administered 2013-11-27: 16 mg via INTRAVENOUS

## 2013-11-27 MED ORDER — SODIUM CHLORIDE 0.9 % IV SOLN
410.0000 mg | Freq: Once | INTRAVENOUS | Status: AC
  Administered 2013-11-27: 410 mg via INTRAVENOUS
  Filled 2013-11-27: qty 41

## 2013-11-27 MED ORDER — SODIUM CHLORIDE 0.9 % IV SOLN: 60.0000 mg/m2 | Freq: Once | INTRAVENOUS | Status: DC

## 2013-11-27 MED ORDER — SODIUM CHLORIDE 0.9 % IV SOLN
75.0000 mg/m2 | Freq: Once | INTRAVENOUS | Status: AC
  Administered 2013-11-27: 120 mg via INTRAVENOUS
  Filled 2013-11-27: qty 12

## 2013-11-27 MED ORDER — SODIUM CHLORIDE 0.9 % IJ SOLN
10.0000 mL | INTRAMUSCULAR | Status: DC | PRN
  Administered 2013-11-27: 10 mL
  Filled 2013-11-27: qty 10

## 2013-11-27 NOTE — Progress Notes (Signed)
1436 negative carbo skin test noted at 5 minutes  1446 negative carbo skin test noted at 15 minutes

## 2013-11-27 NOTE — Progress Notes (Signed)
1501 negative skin test noted at 39 minutes

## 2013-11-27 NOTE — Patient Instructions (Signed)
[facility] Discharge Instructions for Patients Receiving Chemotherapy  Today you received the following chemotherapy agents taxotere and carboplatin.  To help prevent nausea and vomiting after your treatment, we encourage you to take your nausea medication zofran.   If you develop nausea and vomiting that is not controlled by your nausea medication, call the clinic.   BELOW ARE SYMPTOMS THAT SHOULD BE REPORTED IMMEDIATELY:  *FEVER GREATER THAN 100.5 F  *CHILLS WITH OR WITHOUT FEVER  NAUSEA AND VOMITING THAT IS NOT CONTROLLED WITH YOUR NAUSEA MEDICATION  *UNUSUAL SHORTNESS OF BREATH  *UNUSUAL BRUISING OR BLEEDING  TENDERNESS IN MOUTH AND THROAT WITH OR WITHOUT PRESENCE OF ULCERS  *URINARY PROBLEMS  *BOWEL PROBLEMS  UNUSUAL RASH Items with * indicate a potential emergency and should be followed up as soon as possible.  Feel free to call the clinic you have any questions or concerns. The clinic phone number is [phone].

## 2013-11-27 NOTE — Telephone Encounter (Signed)
per email from [place] she will see pt on 1/22 @ 2:30pm. added lb/fu for 1/22. lmonvm for pt today confirming appt and asked that she get new schedule when she comes in tomorrow if she didn't get one today.  message left on cell as I could not get a complete message on home phone

## 2013-11-28 ENCOUNTER — Telehealth: Payer: Self-pay | Admitting: *Deleted

## 2013-11-28 ENCOUNTER — Ambulatory Visit (HOSPITAL_BASED_OUTPATIENT_CLINIC_OR_DEPARTMENT_OTHER): Payer: Medicare Other

## 2013-11-28 VITALS — BP 145/71 | HR 61 | Temp 98.5°F

## 2013-11-28 DIAGNOSIS — C549 Malignant neoplasm of corpus uteri, unspecified: Secondary | ICD-10-CM

## 2013-11-28 DIAGNOSIS — Z5189 Encounter for other specified aftercare: Secondary | ICD-10-CM

## 2013-11-28 DIAGNOSIS — C541 Malignant neoplasm of endometrium: Secondary | ICD-10-CM

## 2013-11-28 MED ORDER — PEGFILGRASTIM INJECTION 6 MG/0.6ML
6.0000 mg | Freq: Once | SUBCUTANEOUS | Status: AC
  Administered 2013-11-28: 6 mg via SUBCUTANEOUS
  Filled 2013-11-28: qty 0.6

## 2013-11-28 NOTE — Telephone Encounter (Signed)
Pam Vazquez here for Neulasta injection following 1st carbo/taxot chemotherapy.  States that she is doing well.  No nausea, vomiting, or diarrhea.  Is drinking and eating well.  All questions answered.  Knows to call if she has any problems or concerns.

## 2013-12-04 ENCOUNTER — Encounter: Payer: Self-pay | Admitting: Oncology

## 2013-12-04 ENCOUNTER — Ambulatory Visit (HOSPITAL_BASED_OUTPATIENT_CLINIC_OR_DEPARTMENT_OTHER): Payer: Medicare Other | Admitting: Oncology

## 2013-12-04 ENCOUNTER — Ambulatory Visit (HOSPITAL_BASED_OUTPATIENT_CLINIC_OR_DEPARTMENT_OTHER): Payer: Medicare Other

## 2013-12-04 ENCOUNTER — Ambulatory Visit: Payer: Medicare Other | Admitting: Gynecologic Oncology

## 2013-12-04 VITALS — BP 94/55 | HR 76 | Temp 98.1°F | Resp 18 | Ht 60.0 in | Wt 138.7 lb

## 2013-12-04 DIAGNOSIS — C7982 Secondary malignant neoplasm of genital organs: Secondary | ICD-10-CM

## 2013-12-04 DIAGNOSIS — C549 Malignant neoplasm of corpus uteri, unspecified: Secondary | ICD-10-CM

## 2013-12-04 DIAGNOSIS — C541 Malignant neoplasm of endometrium: Secondary | ICD-10-CM

## 2013-12-04 DIAGNOSIS — E860 Dehydration: Secondary | ICD-10-CM

## 2013-12-04 DIAGNOSIS — G608 Other hereditary and idiopathic neuropathies: Secondary | ICD-10-CM

## 2013-12-04 LAB — CBC WITH DIFFERENTIAL/PLATELET
BASO%: 0.4 % (ref 0.0–2.0)
Basophils Absolute: 0.1 10*3/uL (ref 0.0–0.1)
EOS ABS: 0.1 10*3/uL (ref 0.0–0.5)
EOS%: 0.8 % (ref 0.0–7.0)
HEMATOCRIT: 36.5 % (ref 34.8–46.6)
HEMOGLOBIN: 12.4 g/dL (ref 11.6–15.9)
LYMPH%: 35.9 % (ref 14.0–49.7)
MCH: 35.2 pg — AB (ref 25.1–34.0)
MCHC: 33.9 g/dL (ref 31.5–36.0)
MCV: 103.8 fL — AB (ref 79.5–101.0)
MONO#: 1.4 10*3/uL — ABNORMAL HIGH (ref 0.1–0.9)
MONO%: 10.8 % (ref 0.0–14.0)
NEUT#: 6.6 10*3/uL — ABNORMAL HIGH (ref 1.5–6.5)
NEUT%: 52.1 % (ref 38.4–76.8)
PLATELETS: 163 10*3/uL (ref 145–400)
RBC: 3.52 10*6/uL — ABNORMAL LOW (ref 3.70–5.45)
RDW: 13.8 % (ref 11.2–14.5)
WBC: 12.7 10*3/uL — ABNORMAL HIGH (ref 3.9–10.3)
lymph#: 4.6 10*3/uL — ABNORMAL HIGH (ref 0.9–3.3)

## 2013-12-04 LAB — COMPREHENSIVE METABOLIC PANEL (CC13)
ALK PHOS: 71 U/L (ref 40–150)
ALT: 19 U/L (ref 0–55)
AST: 15 U/L (ref 5–34)
Albumin: 3.6 g/dL (ref 3.5–5.0)
Anion Gap: 13 mEq/L — ABNORMAL HIGH (ref 3–11)
BUN: 37 mg/dL — ABNORMAL HIGH (ref 7.0–26.0)
CO2: 27 mEq/L (ref 22–29)
CREATININE: 1.4 mg/dL — AB (ref 0.6–1.1)
Calcium: 9.9 mg/dL (ref 8.4–10.4)
Chloride: 102 mEq/L (ref 98–109)
Glucose: 142 mg/dl — ABNORMAL HIGH (ref 70–140)
Potassium: 3.7 mEq/L (ref 3.5–5.1)
Sodium: 142 mEq/L (ref 136–145)
Total Bilirubin: 0.36 mg/dL (ref 0.20–1.20)
Total Protein: 6 g/dL — ABNORMAL LOW (ref 6.4–8.3)

## 2013-12-04 MED ORDER — SODIUM CHLORIDE 0.9 % IJ SOLN
10.0000 mL | INTRAMUSCULAR | Status: DC | PRN
  Administered 2013-12-04: 10 mL via INTRAVENOUS
  Filled 2013-12-04: qty 10

## 2013-12-04 MED ORDER — HEPARIN SOD (PORK) LOCK FLUSH 100 UNIT/ML IV SOLN
500.0000 [IU] | Freq: Once | INTRAVENOUS | Status: AC
  Administered 2013-12-04: 500 [IU] via INTRAVENOUS
  Filled 2013-12-04: qty 5

## 2013-12-04 MED ORDER — SODIUM CHLORIDE 0.9 % IV SOLN
INTRAVENOUS | Status: AC
  Administered 2013-12-04: 16:00:00 via INTRAVENOUS

## 2013-12-04 NOTE — Patient Instructions (Signed)
Dehydration, Adult Dehydration is when you lose more fluids from the body than you take in. Vital organs like the kidneys, brain, and heart cannot function without a proper amount of fluids and salt. Any loss of fluids from the body can cause dehydration.  CAUSES   Vomiting.  Diarrhea.  Excessive sweating.  Excessive urine output.  Fever. SYMPTOMS  Mild dehydration  Thirst.  Dry lips.  Slightly dry mouth. Moderate dehydration  Very dry mouth.  Sunken eyes.  Skin does not bounce back quickly when lightly pinched and released.  Dark urine and decreased urine production.  Decreased tear production.  Headache. Severe dehydration  Very dry mouth.  Extreme thirst.  Rapid, weak pulse (more than 100 beats per minute at rest).  Cold hands and feet.  Not able to sweat in spite of heat and temperature.  Rapid breathing.  Blue lips.  Confusion and lethargy.  Difficulty being awakened.  Minimal urine production.  No tears. DIAGNOSIS  Your caregiver will diagnose dehydration based on your symptoms and your exam. Blood and urine tests will help confirm the diagnosis. The diagnostic evaluation should also identify the cause of dehydration. TREATMENT  Treatment of mild or moderate dehydration can often be done at home by increasing the amount of fluids that you drink. It is best to drink small amounts of fluid more often. Drinking too much at one time can make vomiting worse. Refer to the home care instructions below. Severe dehydration needs to be treated at the hospital where you will probably be given intravenous (IV) fluids that contain water and electrolytes. HOME CARE INSTRUCTIONS   Ask your caregiver about specific rehydration instructions.  Drink enough fluids to keep your urine clear or pale yellow.  Drink small amounts frequently if you have nausea and vomiting.  Eat as you normally do.  Avoid:  Foods or drinks high in sugar.  Carbonated  drinks.  Juice.  Extremely hot or cold fluids.  Drinks with caffeine.  Fatty, greasy foods.  Alcohol.  Tobacco.  Overeating.  Gelatin desserts.  Wash your hands well to avoid spreading bacteria and viruses.  Only take over-the-counter or prescription medicines for pain, discomfort, or fever as directed by your caregiver.  Ask your caregiver if you should continue all prescribed and over-the-counter medicines.  Keep all follow-up appointments with your caregiver. SEEK MEDICAL CARE IF:  You have abdominal pain and it increases or stays in one area (localizes).  You have a rash, stiff neck, or severe headache.  You are irritable, sleepy, or difficult to awaken.  You are weak, dizzy, or extremely thirsty. SEEK IMMEDIATE MEDICAL CARE IF:   You are unable to keep fluids down or you get worse despite treatment.  You have frequent episodes of vomiting or diarrhea.  You have blood or green matter (bile) in your vomit.  You have blood in your stool or your stool looks black and tarry.  You have not urinated in 6 to 8 hours, or you have only urinated a small amount of very dark urine.  You have a fever.  You faint. MAKE SURE YOU:   Understand these instructions.  Will watch your condition.  Will get help right away if you are not doing well or get worse. Document Released: 10/30/2005 Document Revised: 01/22/2012 Document Reviewed: 06/19/2011 Exit[employer] Patient Information 2014 [employer].  

## 2013-12-04 NOTE — Progress Notes (Signed)
OFFICE PROGRESS NOTE  CC  Vazquez,Pam B, MD [address], [place] [address] Dr. Dyke Maes Pam Vazquez Dr. 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 She had undergone a robotic assisted total laparoscopic hysterectomy bilateral salpingo-oophorectomy right pelvic lymph node biopsy on 12/19/2011 without any complications. 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 There was no involvement of other organs lymphovascular invasion was not identified. 3 lymph nodes were negative for metastatic disease. 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 Patient subsequently continued to be seen by gynecologic oncology. In October 2013 patient had a Pap smear that was normal limits there was noted to be some granulation tissue that was removed from the cough. There is no evidence of disease.  #2patient was seen by Dr. Dahlia Client on 04/29/2013 she had vaginal exam performed that was notable for a nodule in the vagina concerning for recurrence. A rectal examination revealed a firm 8 cm mass also. Because of this she had a biopsy performed. She also had a CT scan of the abdomen and pelvis ordered to further evaluate the sigmoid mass. The pathology came back inconclusive. However the CT of the abdomen and pelvis showed a lobular mass at the vagina cuff measuring 7.0 x 3.2 cm proximally 4 cm craniocaudal dimension. The mass compressed the distal aspect of the sigmoid colon just above the rectum there was no mass approximates the posterior wall of the bladder without clear invasion noted evidence of  obstruction of stool proximal to this mass. There was also noted to be a large necrotic lymph node measuring 2.8 cm within the sigmoid mesocolon centrally. There are also small bilateral external iliac lymph nodes. There were no aggressive osseous lesions.   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  #4 patient was begun on palliative chemotherapy starting on 06/03/2013 with Taxol carboplatinum. Total of 6 cycles of treatment as planned.  #5 pain control: She is being managed with fentanyl patches and oxycodone.  CURRENT THERAPY: Taxotere/carboplatin cycle 1 day 8  INTERVAL HISTORY: Pam Vazquez 68 y.o. female returns for followup she received her cycle 1 last week. This consisted of Taxotere carboplatinum. She is tired. She does not have any neuropathy currently. She's had no nausea or vomiting no fevers chills or night sweats. She has not had any bleeding. She has been eating well. She has not noticed any rashes. She is denying any headaches double vision blurring of vision. No bleeding. Remainder of the 10 point review of systems is negative. MEDICAL HISTORY: Past Medical History  Diagnosis Date  . Diabetes mellitus   . PONV (postoperative nausea and vomiting)   . Cancer     endometrial  . Angina     stress test on chart from 3/12/ none since March 2012  . Chronic kidney disease     kidney stones  . Hypertension     / EKG 1/13 Epic    ALLERGIES:  has No Known Allergies.  MEDICATIONS:  Current Outpatient Prescriptions  Medication Sig Dispense Refill  . amLODipine (NORVASC) 2.5 MG tablet Take 2.5 mg by mouth every  Vazquez.       . cyanocobalamin 2000 MCG tablet Take 2,000 mcg by mouth 2 (two) times daily.      [name] dexamethasone (DECADRON) 4 MG tablet Take two tablets (8mg s) two times a day the day before Taxotere and for 3 days after chemotherapy. Don't take the day of Taxotere.  30  tablet  1  . doxycycline (VIBRA-TABS) 100 MG tablet Take 100 mg by mouth every other day.      . fentaNYL (DURAGESIC - DOSED MCG/HR) 25 MCG/HR patch Place 1 patch (25 mcg total) onto the skin every 3 (three) days.  10 patch  0  . gabapentin (NEURONTIN) 100 MG capsule Take 2 capsules (200 mg total) by mouth 3 (three) times daily.  180 capsule  1  . lidocaine-prilocaine (EMLA) cream Apply topically as needed.  30 g  6  . lisinopril-hydrochlorothiazide (PRINZIDE,ZESTORETIC) 20-25 MG per tablet Take 1 tablet by mouth daily after breakfast.       . loratadine (CLARITIN) 10 MG tablet Take 10 mg by mouth daily. Take as needed      . LORazepam (ATIVAN) 0.5 MG tablet Take 1 tablet (0.5 mg total) by mouth every 6 (six) hours as needed (Nausea or vomiting).  30 tablet  0  . metFORMIN (GLUCOPHAGE) 500 MG tablet Take 500 mg by mouth 2 (two) times daily.       . metoprolol tartrate (LOPRESSOR) 25 MG tablet Take 25 mg by mouth 2 (two) times daily.       . Multiple Vitamin (MULTIVITAMIN WITH MINERALS) TABS Take 1 tablet by mouth every Vazquez. She takes Dance movement psychotherapist.      [name] omeprazole (PRILOSEC) 40 MG capsule Take 1 capsule (40 mg total) by mouth daily.  30 capsule  6  . ondansetron (ZOFRAN) 8 MG tablet Take 1 tablet (8 mg total) by mouth 2 (two) times daily. Take two times a day starting the day after chemo for 3 days. Then take two times a day as needed for nausea or vomiting.  30 tablet  1  . prochlorperazine (COMPAZINE) 10 MG tablet Take 1 tablet (10 mg total) by mouth every 6 (six) hours as needed (Nausea or vomiting).  30 tablet  1   No current facility-administered medications for this visit.    SURGICAL HISTORY:  Past Surgical History  Procedure Laterality Date  . Hysteroscopy w/d&c  11/21/2011    Procedure: DILATATION AND CURETTAGE /HYSTEROSCOPY;  Surgeon: Gus Height;  Location: [facility];  Service: Gynecology;  Laterality: N/A;  . Tubal ligation    . Breast mass removal  1984, 1992    L breast  .  Lithotripsy  2000, 2002  . Back surgery      91 and 2001  . Dilation and curettage of uterus    . Node dissection  12/19/2011    Procedure: NODE DISSECTION;  Surgeon: Pam Morning, MD PHD;  Location: [facility];  Service: Gynecology;  Laterality: N/A;    REVIEW OF SYSTEMS:  A 10 point review of systems was conducted and is otherwise negative except for what is noted above.    PHYSICAL EXAMINATION: Blood pressure 94/55, pulse 76, temperature 98.1 F (36.7 C), temperature source Oral, resp. rate 18, height 5' (1.524 m), weight 138 lb 11.2 oz (62.914 kg). Body mass index is 27.09 kg/(m^2). General: Patient is a well appearing female in no acute distress HEENT: PERRLA, sclerae anicteric no conjunctival pallor, MMM Neck: supple, no palpable adenopathy Lungs: clear to auscultation bilaterally, no  wheezes, rhonchi, or rales Cardiovascular: regular rate rhythm, S1, S2, no murmurs, rubs or gallops Abdomen: Soft, non-tender, non-distended, normoactive bowel sounds, no HSM Extremities: warm and well perfused, no clubbing, cyanosis, or edema Skin: No rashes or lesions Neuro: Non-focal ECOG PERFORMANCE STATUS: 0 - Asymptomatic  LABORATORY DATA: Lab Results  Component Value Date   WBC 12.7* 12/04/2013   HGB 12.4 12/04/2013   HCT 36.5 12/04/2013   MCV 103.8* 12/04/2013   PLT 163 12/04/2013      Chemistry      Component Value Date/Time   NA 142 12/04/2013 1338   NA 141 12/20/2011 0334   K 3.7 12/04/2013 1338   K 4.1 12/20/2011 0334   CL 104 04/29/2013 1229   CL 104 12/20/2011 0334   CO2 27 12/04/2013 1338   CO2 27 12/20/2011 0334   BUN 37.0* 12/04/2013 1338   BUN 10 12/20/2011 0334   CREATININE 1.4* 12/04/2013 1338   CREATININE 0.80 12/20/2011 0334      Component Value Date/Time   CALCIUM 9.9 12/04/2013 1338   CALCIUM 9.5 12/20/2011 0334   ALKPHOS 71 12/04/2013 1338   ALKPHOS 69 12/15/2011 1125   AST 15 12/04/2013 1338   AST 22 12/15/2011 1125   ALT 19 12/04/2013 1338   ALT 19 12/15/2011 1125   BILITOT 0.36  12/04/2013 1338   BILITOT 0.8 12/15/2011 1125    ADDITIONAL INFORMATION: The malignant cells are positive for estrogen receptor, progesterone receptor, cytokeratin 7 and focally positive for CDX-2. They are negative for cytokeratin 20. Given the clinical history as well as the histologic features, the findings are consistent with metastatic endometrioid adenocarcinoma. (JBK:kh 05-19-13) Enid Cutter MD Pathologist, Electronic Signature ( Signed 05/19/2013) FINAL DIAGNOSIS Diagnosis Pelvis, biopsy, Central lower - METASTATIC CARCINOMA WITH ASSOCIATED TUMOR NECROSIS. Microscopic Comment Immunostains will be performed and an addendum report will follow. (HCL:kh 05-15-13) Aldona Bar MD Pathologist, Electronic Signature (Case signed 05/15/2013) Specimen Gross and Clinical Information Specimen(s) Obtained: Pelvis, biopsy, Central lower Specimen Clinical Information Recurrent cancer (tl) Gross Received in formalin are fragments and cores of soft white tissue having an aggregate measurement of 1.3 x 0.5 x 0.1 cm. The specimen is submitted in toto. (GRP: [id number] [id number]) 1 of 2 Duplicate copy FINAL for Grieshaber, Rosa P [id number]) Stain(s) used in Diagnosis: The following stain(s) were used in diagnosing the case: PR - NOACIS, ER - NOACIS, CK-7, CDX-2, CK 20. The control(s) stained appropriately. Disc   RADIOGRAPHIC STUDIES:  Dg Chest 2 View  05/06/2013   *RADIOLOGY REPORT*  Clinical Data: Endometrial cancer  CHEST - 2 VIEW  Comparison: December 15, 2011.  Findings: Cardiomediastinal silhouette appears normal.  No acute pulmonary disease is noted.  Bony thorax is intact.  Stable small dense nodule seen in right lower lobe compared to prior exam most consistent with granuloma.  IMPRESSION: Stable small nodule seen in right lower lobe most consistent with granuloma.  No acute cardiopulmonary abnormality seen.   Original Report Authenticated By: Marijo Conception.,  M.D.   Ct Abdomen Pelvis W  Contrast  05/05/2013   *RADIOLOGY REPORT*  Clinical Data: Pelvic pain.  History of endometrial carcinoma.  CT ABDOMEN AND PELVIS WITH CONTRAST  Technique:  Multidetector CT imaging of the abdomen and pelvis was performed following the standard protocol during bolus administration of intravenous contrast.  Contrast: 184mL OMNIPAQUE IOHEXOL 300 MG/ML  SOLN  Comparison: CT 01/05/2006  Findings: Lung bases are clear.  No pericardial fluid.  No focal hepatic lesion.  The  gallbladder, pancreas, spleen, adrenal glands, kidneys are normal.  There is a cortical scarring of the right kidney.  No hydronephrosis.  The stomach, small bowel, appendix, cecum are normal.  The colon is normal to the level of the descending colon.  There is a lobular mass at the vaginal cuff measuring 7.0 x 3.2 cm in axial dimension and approximately 4 cm craniocaudad dimension. There is a mass compresses the distal aspect of the sigmoid colon just above the rectum (image 62, axial, image 57, coronal series). No mass approximates the posterior wall the bladder without clear invasion .There is no evidence of obstruction of stool proximal to this mass compressing the sigmoid colon.  There is a large necrotic lymph node measuring 2.8 cm within the sigmoid mesocolon centrally (60).  Patient status post hysterectomy and oophorectomy.  Small bilateral external iliac lymph nodes.  For example 8 mm node on the right and 5 mm node on the left (image 59).  There is no periaortic adenopathy.  No periportal or mesenteric adenopathy  Review of the bone windows demonstrates post lumbar  fusion.  No aggressive osseous lesions.  IMPRESSION:  1.  Lobular mass at the vaginal cuff is consistent with endometrial carcinoma progression.  2.  Mass extends to the serosal surface of the sigmoid colon and compresses the lumen of the sigmoid colon.  No obstruction at this time.  3.  Mass  approximates the posterior wall of the bladder without clear invasion.  4. Large  necrotic lymph node within the sigmoid mesocolon superior to the vaginal cuff.  5.  Small bilateral iliac lymph nodes.   Original Report Authenticated By: Suzy Bouchard, M.D.   Nm Pet Image Restag (ps) Skull Base To Thigh  05/09/2013   *RADIOLOGY REPORT*  Clinical Data: Subsequent treatment strategy for endometrial cancer. Hysterectomy 12/19/2011.  Recurrent pelvic masses on the CT.  NUCLEAR MEDICINE PET SKULL BASE TO THIGH  Fasting Blood Glucose:  141  Technique:  18.3 mCi F-18 FDG was injected intravenously. CT data was obtained and used for attenuation correction and anatomic localization only.  (This was not acquired as a diagnostic CT examination.) Additional exam technical data entered on technologist worksheet.  Comparison:  Abdominal pelvic CT 05/05/2013.  Findings:  Neck: No hypermetabolic lymph nodes in the neck.  Chest:  There are no hypermetabolic mediastinal or hilar lymph nodes.  There is no abnormal metabolic activity within the lungs. CT images demonstrate mild atherosclerosis.  There are no suspicious pulmonary findings.  Abdomen/Pelvis:  There is no abnormal metabolic activity within the liver, adrenal glands, spleen or pancreas.  No hypermetabolic lymph nodes are identified within the abdomen.  However, the large lobulated mass contiguous with the vaginal cuff is hypermetabolic. This demonstrates an SUV max of approximately 24.5.  The mass measures up to 6.8 x 3.6 cm transverse on image 186.  The necrotic nodal mass within the sigmoid the mesocolon is also hypermetabolic. This measures 3.1 cm on image 175 and demonstrates an SUV max of 17.1.  The right external iliac and pelvic sidewall lymph nodes demonstrated on the recent CT are also hypermetabolic.  The largest node measures 2.0 cm on image 176 and has an SUV max of 17.1.  No hypermetabolic left pelvic side wall nodes are identified.  There is no generalized peritoneal hypermetabolic activity or ascites. Scattered activity in the colon is  likely physiologic.  The right kidney demonstrates lobularity and scarring.  Skeleton:  No focal hypermetabolic activity to suggest skeletal metastasis.  IMPRESSION:  1.  Multifocal recurrence within the pelvis involving the vaginal cuff, mesenteric and right pelvic sidewall lymph nodes. 2.  No distant metastases identified.   Original Report Authenticated By: Richardean Sale, M.D.   Ct Biopsy  05/14/2013   *RADIOLOGY REPORT*  Clinical history:68 year old with history of endometrial cancer and suspicious lesions in the pelvis.  PROCEDURE(S): CT GUIDED BIOPSY OF PELVIC MASS  Physician: Stephan Minister. Henn, MD  Medications:Versed 2 mg, Fentanyl 100 mcg.  A radiology nurse monitored the patient for moderate sedation.  Moderate sedation time:28 minutes  Procedure:The procedure was explained to the patient.  The risks and benefits of the procedure were discussed and the patient's questions were addressed.  Informed consent was obtained from the patient.  The patient was placed prone on the CT scanner.  Images of the pelvis were obtained.  The left gluteal area was prepped and draped in a sterile fashion.  Skin and soft tissues were anesthetized with lidocaine.  83 gauge needle was directed into the pelvic mass from a left transgluteal approach.  Needle position confirmed along the posterior aspect of the lesion.  Three core biopsies were obtained with an 18 gauge device.  Samples placed in formalin.  17 gauge needle was removed without complication.  Findings:There is a pelvic mass situated adjacent to the vaginal cuff.  The needle was positioned along the posterior aspect of the lesion.  Complications: None  Impression:CT guided core biopsies of the pelvic mass.   Original Report Authenticated By: Markus Daft, M.D.    ASSESSMENT/PLAN: 68 year old female with  #1 recurrent endometrioid adenocarcinoma of the uterus: Status post palliative chemotherapy consisting of Taxol and carboplatinum x6 cycles. She did have some  residual disease. She therefore was begun on  Taxotere and carboplatinum starting 11/27/2013. Thus far she's tolerated it well.   #2 pain control patient is on fentanyl patches as well as oxycodone. Her pain is really well controlled, and she takes the oxycodone very infrequently  #3 neuropathy: Patient is receiving gabapentin 200 mg 4 times a day.  #4 dehydration: Patient is encouraged to take in more fluids  #5 patient will return on 12/18/2013 for cycle #2 of Taxotere and carboplatinum.   All questions were answered. The patient knows to call the clinic with any problems, questions or concerns. We can certainly see the patient much sooner if necessary.  I spent 25 minutes counseling the patient face to face. The total time spent in the appointment was 30 minutes.  Marcy Panning, MD Medical/Oncology [facility] [phone] (beeper) [phone] (Office)

## 2013-12-18 ENCOUNTER — Telehealth: Payer: Self-pay | Admitting: *Deleted

## 2013-12-18 ENCOUNTER — Ambulatory Visit (HOSPITAL_BASED_OUTPATIENT_CLINIC_OR_DEPARTMENT_OTHER): Payer: Medicare Other | Admitting: Hematology and Oncology

## 2013-12-18 ENCOUNTER — Telehealth: Payer: Self-pay | Admitting: Hematology and Oncology

## 2013-12-18 ENCOUNTER — Other Ambulatory Visit (HOSPITAL_BASED_OUTPATIENT_CLINIC_OR_DEPARTMENT_OTHER): Payer: Medicare Other

## 2013-12-18 ENCOUNTER — Ambulatory Visit (HOSPITAL_BASED_OUTPATIENT_CLINIC_OR_DEPARTMENT_OTHER): Payer: Medicare Other

## 2013-12-18 VITALS — BP 137/79 | HR 101 | Temp 98.2°F | Resp 18 | Ht 60.0 in | Wt 144.7 lb

## 2013-12-18 DIAGNOSIS — D6959 Other secondary thrombocytopenia: Secondary | ICD-10-CM

## 2013-12-18 DIAGNOSIS — C549 Malignant neoplasm of corpus uteri, unspecified: Secondary | ICD-10-CM

## 2013-12-18 DIAGNOSIS — R5381 Other malaise: Secondary | ICD-10-CM

## 2013-12-18 DIAGNOSIS — C541 Malignant neoplasm of endometrium: Secondary | ICD-10-CM

## 2013-12-18 DIAGNOSIS — R5383 Other fatigue: Secondary | ICD-10-CM

## 2013-12-18 DIAGNOSIS — R109 Unspecified abdominal pain: Secondary | ICD-10-CM

## 2013-12-18 DIAGNOSIS — G589 Mononeuropathy, unspecified: Secondary | ICD-10-CM

## 2013-12-18 LAB — CBC WITH DIFFERENTIAL/PLATELET
BASO%: 0.4 % (ref 0.0–2.0)
Basophils Absolute: 0 10*3/uL (ref 0.0–0.1)
EOS ABS: 0 10*3/uL (ref 0.0–0.5)
EOS%: 0 % (ref 0.0–7.0)
HCT: 32.9 % — ABNORMAL LOW (ref 34.8–46.6)
HGB: 11.1 g/dL — ABNORMAL LOW (ref 11.6–15.9)
LYMPH%: 5.2 % — AB (ref 14.0–49.7)
MCH: 35.2 pg — ABNORMAL HIGH (ref 25.1–34.0)
MCHC: 33.7 g/dL (ref 31.5–36.0)
MCV: 104.6 fL — AB (ref 79.5–101.0)
MONO#: 0 10*3/uL — AB (ref 0.1–0.9)
MONO%: 0.5 % (ref 0.0–14.0)
NEUT%: 93.9 % — ABNORMAL HIGH (ref 38.4–76.8)
NEUTROS ABS: 7.5 10*3/uL — AB (ref 1.5–6.5)
PLATELETS: 74 10*3/uL — AB (ref 145–400)
RBC: 3.15 10*6/uL — ABNORMAL LOW (ref 3.70–5.45)
RDW: 14.6 % — AB (ref 11.2–14.5)
WBC: 7.9 10*3/uL (ref 3.9–10.3)
lymph#: 0.4 10*3/uL — ABNORMAL LOW (ref 0.9–3.3)

## 2013-12-18 LAB — COMPREHENSIVE METABOLIC PANEL (CC13)
ALBUMIN: 4.2 g/dL (ref 3.5–5.0)
ALT: 18 U/L (ref 0–55)
ANION GAP: 15 meq/L — AB (ref 3–11)
AST: 14 U/L (ref 5–34)
Alkaline Phosphatase: 85 U/L (ref 40–150)
BUN: 27.1 mg/dL — ABNORMAL HIGH (ref 7.0–26.0)
CO2: 24 mEq/L (ref 22–29)
Calcium: 11.5 mg/dL — ABNORMAL HIGH (ref 8.4–10.4)
Chloride: 105 mEq/L (ref 98–109)
Creatinine: 1.1 mg/dL (ref 0.6–1.1)
GLUCOSE: 313 mg/dL — AB (ref 70–140)
POTASSIUM: 4.3 meq/L (ref 3.5–5.1)
SODIUM: 143 meq/L (ref 136–145)
TOTAL PROTEIN: 7.3 g/dL (ref 6.4–8.3)
Total Bilirubin: 0.72 mg/dL (ref 0.20–1.20)

## 2013-12-18 MED ORDER — SODIUM CHLORIDE 0.45 % IV SOLN
1000.0000 mL | INTRAVENOUS | Status: DC
  Filled 2013-12-18: qty 1000

## 2013-12-18 MED ORDER — SODIUM CHLORIDE 0.45 % IV SOLN
1000.0000 mL | Freq: Once | INTRAVENOUS | Status: AC
  Administered 2013-12-18: 1000 mL via INTRAVENOUS
  Filled 2013-12-18: qty 1000

## 2013-12-18 MED ORDER — SODIUM CHLORIDE 0.9 % IJ SOLN
10.0000 mL | INTRAMUSCULAR | Status: DC | PRN
  Administered 2013-12-18: 10 mL via INTRAVENOUS
  Filled 2013-12-18: qty 10

## 2013-12-18 MED ORDER — SODIUM CHLORIDE 0.45 % IV SOLN
2000.0000 mL | Freq: Once | INTRAVENOUS | Status: DC
  Filled 2013-12-18: qty 2000

## 2013-12-18 MED ORDER — HEPARIN SOD (PORK) LOCK FLUSH 100 UNIT/ML IV SOLN
500.0000 [IU] | Freq: Once | INTRAVENOUS | Status: AC
  Administered 2013-12-18: 500 [IU] via INTRAVENOUS
  Filled 2013-12-18: qty 5

## 2013-12-18 MED ORDER — FENTANYL 25 MCG/HR TD PT72: 25.0000 ug | MEDICATED_PATCH | TRANSDERMAL | Status: DC

## 2013-12-18 NOTE — Patient Instructions (Signed)
Dehydration, Adult Dehydration is when you lose more fluids from the body than you take in. Vital organs like the kidneys, brain, and heart cannot function without a proper amount of fluids and salt. Any loss of fluids from the body can cause dehydration.  CAUSES   Vomiting.  Diarrhea.  Excessive sweating.  Excessive urine output.  Fever. SYMPTOMS  Mild dehydration  Thirst.  Dry lips.  Slightly dry mouth. Moderate dehydration  Very dry mouth.  Sunken eyes.  Skin does not bounce back quickly when lightly pinched and released.  Dark urine and decreased urine production.  Decreased tear production.  Headache. Severe dehydration  Very dry mouth.  Extreme thirst.  Rapid, weak pulse (more than 100 beats per minute at rest).  Cold hands and feet.  Not able to sweat in spite of heat and temperature.  Rapid breathing.  Blue lips.  Confusion and lethargy.  Difficulty being awakened.  Minimal urine production.  No tears. DIAGNOSIS  Your caregiver will diagnose dehydration based on your symptoms and your exam. Blood and urine tests will help confirm the diagnosis. The diagnostic evaluation should also identify the cause of dehydration. TREATMENT  Treatment of mild or moderate dehydration can often be done at home by increasing the amount of fluids that you drink. It is best to drink small amounts of fluid more often. Drinking too much at one time can make vomiting worse. Refer to the home care instructions below. Severe dehydration needs to be treated at the hospital where you will probably be given intravenous (IV) fluids that contain water and electrolytes. HOME CARE INSTRUCTIONS   Ask your caregiver about specific rehydration instructions.  Drink enough fluids to keep your urine clear or pale yellow.  Drink small amounts frequently if you have nausea and vomiting.  Eat as you normally do.  Avoid:  Foods or drinks high in sugar.  Carbonated  drinks.  Juice.  Extremely hot or cold fluids.  Drinks with caffeine.  Fatty, greasy foods.  Alcohol.  Tobacco.  Overeating.  Gelatin desserts.  Wash your hands well to avoid spreading bacteria and viruses.  Only take over-the-counter or prescription medicines for pain, discomfort, or fever as directed by your caregiver.  Ask your caregiver if you should continue all prescribed and over-the-counter medicines.  Keep all follow-up appointments with your caregiver. SEEK MEDICAL CARE IF:  You have abdominal pain and it increases or stays in one area (localizes).  You have a rash, stiff neck, or severe headache.  You are irritable, sleepy, or difficult to awaken.  You are weak, dizzy, or extremely thirsty. SEEK IMMEDIATE MEDICAL CARE IF:   You are unable to keep fluids down or you get worse despite treatment.  You have frequent episodes of vomiting or diarrhea.  You have blood or green matter (bile) in your vomit.  You have blood in your stool or your stool looks black and tarry.  You have not urinated in 6 to 8 hours, or you have only urinated a small amount of very dark urine.  You have a fever.  You faint. MAKE SURE YOU:   Understand these instructions.  Will watch your condition.  Will get help right away if you are not doing well or get worse. Document Released: 10/30/2005 Document Revised: 01/22/2012 Document Reviewed: 06/19/2011 Exit[employer] Patient Information 2014 [employer].  

## 2013-12-18 NOTE — Progress Notes (Signed)
OFFICE PROGRESS NOTE  CC  [name],[name], MD [address] [place] [address] Dr. Threasa Heads Dr. 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 She had undergone a robotic assisted total laparoscopic hysterectomy bilateral salpingo-oophorectomy right pelvic lymph node biopsy on 12/19/2011 without any complications. 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 There was no involvement of other organs lymphovascular invasion was not identified. 3 lymph nodes were negative for metastatic disease. 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 Patient subsequently continued to be seen by gynecologic oncology. In October 2013 patient had a Pap smear that was normal limits there was noted to be some granulation tissue that was removed from the cough. There is no evidence of disease.  #2patient was seen by Dr. Dahlia Client on 04/29/2013 she had vaginal exam performed that was notable for a nodule in the vagina concerning for recurrence. A rectal examination revealed a firm 8 cm mass also. Because of this she had a biopsy performed. She also had a CT scan of the abdomen and pelvis ordered to further evaluate the sigmoid mass. The pathology came back inconclusive. However the CT of the abdomen and pelvis showed a lobular mass at the vagina cuff measuring 7.0 x 3.2 cm proximally 4 cm craniocaudal dimension. The mass compressed the distal aspect of the  sigmoid colon just above the rectum there was no mass approximates the posterior wall of the bladder without clear invasion noted evidence of obstruction of stool proximal to this mass. There was also noted to be a large necrotic lymph node measuring 2.8 cm within the sigmoid mesocolon centrally. There are also small bilateral external iliac lymph nodes. There were no aggressive osseous lesions.   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  #4 patient was begun on palliative chemotherapy starting on 06/03/2013 with Taxol carboplatinum. Total of 6 cycles of treatment as planned.  #5 pain control: She is being managed with fentanyl patches and oxycodone.   INTERVAL HISTORY: MALA GIBBARD 68 y.o. female returns for initiation of cycle#2 chemotherapy with Taxotere and carboplatinum today. She does complain of fatigue, dry skin and also hair loss. She started  having nightmares after chemotherapy lasted for 2-3 days. She complains of decrease in appetite.  She denies any nausea or vomiting no fevers chills or night sweats. She has not had any bleeding.  She is denying any headaches double vision blurring of vision. She says that she's also not drinking enough fluids. The  MEDICAL HISTORY: Past Medical History  Diagnosis Date  . Diabetes mellitus   . PONV (postoperative nausea and vomiting)   . Cancer     endometrial  . Angina     stress test on chart from 3/12/ none since March 2012  . Chronic kidney disease     kidney stones  . Hypertension     / EKG 1/13 Epic    ALLERGIES:  has No Known Allergies.  MEDICATIONS:  Current Outpatient Prescriptions  Medication  Sig Dispense Refill  . amLODipine (NORVASC) 2.5 MG tablet Take 2.5 mg by mouth every morning.       . cyanocobalamin 2000 MCG tablet Take 2,000 mcg by mouth 2 (two) times daily.      [name] dexamethasone (DECADRON) 4 MG tablet Take two  tablets (8mg s) two times a day the day before Taxotere and for 3 days after chemotherapy. Don't take the day of Taxotere.  30 tablet  1  . doxycycline (VIBRA-TABS) 100 MG tablet Take 100 mg by mouth every other day.      . fentaNYL (DURAGESIC - DOSED MCG/HR) 25 MCG/HR patch Place 1 patch (25 mcg total) onto the skin every 3 (three) days.  10 patch  0  . gabapentin (NEURONTIN) 100 MG capsule Take 2 capsules (200 mg total) by mouth 3 (three) times daily.  180 capsule  1  . lidocaine-prilocaine (EMLA) cream Apply topically as needed.  30 g  6  . lisinopril-hydrochlorothiazide (PRINZIDE,ZESTORETIC) 20-25 MG per tablet Take 1 tablet by mouth daily after breakfast.       . loratadine (CLARITIN) 10 MG tablet Take 10 mg by mouth daily. Take as needed      . LORazepam (ATIVAN) 0.5 MG tablet Take 1 tablet (0.5 mg total) by mouth every 6 (six) hours as needed (Nausea or vomiting).  30 tablet  0  . metFORMIN (GLUCOPHAGE) 500 MG tablet Take 500 mg by mouth 2 (two) times daily.       . metoprolol tartrate (LOPRESSOR) 25 MG tablet Take 25 mg by mouth 2 (two) times daily.       . Multiple Vitamin (MULTIVITAMIN WITH MINERALS) TABS Take 1 tablet by mouth every morning. She takes Dance movement psychotherapist.      [name] omeprazole (PRILOSEC) 40 MG capsule Take 1 capsule (40 mg total) by mouth daily.  30 capsule  6  . ondansetron (ZOFRAN) 8 MG tablet Take 1 tablet (8 mg total) by mouth 2 (two) times daily. Take two times a day starting the day after chemo for 3 days. Then take two times a day as needed for nausea or vomiting.  30 tablet  1  . prochlorperazine (COMPAZINE) 10 MG tablet Take 1 tablet (10 mg total) by mouth every 6 (six) hours as needed (Nausea or vomiting).  30 tablet  1   No current facility-administered medications for this visit.   Facility-Administered Medications Ordered in Other Visits  Medication Dose Route Frequency Provider Last Rate Last Dose  . sodium chloride 0.9 % injection 10 mL  10 mL Intravenous PRN Wilmon Arms, MD   10 mL at 12/18/13 1421    SURGICAL HISTORY:  Past Surgical History  Procedure Laterality Date  . Hysteroscopy w/d&c  11/21/2011    Procedure: DILATATION AND CURETTAGE /HYSTEROSCOPY;  Surgeon: Gus Height;  Location: [facility];  Service: Gynecology;  Laterality: N/A;  . Tubal ligation    . Breast mass removal  1984, 1992    L breast  . Lithotripsy  2000, 2002  . Back surgery      91 and 2001  . Dilation and curettage of uterus    . Node dissection  12/19/2011    Procedure: NODE DISSECTION;  Surgeon: [name], MD PHD;  Location: [facility];  Service: Gynecology;  Laterality: N/A;    REVIEW OF SYSTEMS:  A 10 point review of systems was conducted and is otherwise negative except for what is noted above.    PHYSICAL EXAMINATION: Blood pressure 137/79, pulse 101,  temperature 98.2 F (36.8 C), temperature source Oral, resp. rate 18, height 5' (1.524 m), weight 144 lb 11.2 oz (65.635 kg). Body mass index is 28.26 kg/(m^2). General: Patient is a well appearing female in no acute distress HEENT: PERRLA, sclerae anicteric no conjunctival pallor, MMM Neck: supple, no palpable adenopathy Lungs: clear to auscultation bilaterally, no wheezes, rhonchi, or rales Cardiovascular: regular rate rhythm, S1, S2, no murmurs, rubs or gallops Abdomen: Soft, non-tender, non-distended, normoactive bowel sounds, no HSM Extremities: warm and well perfused, no clubbing, cyanosis, or edema Skin: No rashes or lesions Neuro: Non-focal ECOG PERFORMANCE STATUS: 0 - Asymptomatic  LABORATORY DATA: Lab Results  Component Value Date   WBC 7.9 12/18/2013   HGB 11.1* 12/18/2013   HCT 32.9* 12/18/2013   MCV 104.6* 12/18/2013   PLT 74* 12/18/2013      Chemistry      Component Value Date/Time   NA 143 12/18/2013 0829   NA 141 12/20/2011 0334   K 4.3 12/18/2013 0829   K 4.1 12/20/2011 0334   CL 104 04/29/2013 1229   CL 104 12/20/2011 0334   CO2 24 12/18/2013 0829   CO2 27 12/20/2011 0334   BUN 27.1* 12/18/2013 0829   BUN  10 12/20/2011 0334   CREATININE 1.1 12/18/2013 0829   CREATININE 0.80 12/20/2011 0334      Component Value Date/Time   CALCIUM 11.5* 12/18/2013 0829   CALCIUM 9.5 12/20/2011 0334   ALKPHOS 85 12/18/2013 0829   ALKPHOS 69 12/15/2011 1125   AST 14 12/18/2013 0829   AST 22 12/15/2011 1125   ALT 18 12/18/2013 0829   ALT 19 12/15/2011 1125   BILITOT 0.72 12/18/2013 0829   BILITOT 0.8 12/15/2011 1125    ADDITIONAL INFORMATION: The malignant cells are positive for estrogen receptor, progesterone receptor, cytokeratin 7 and focally positive for CDX-2. They are negative for cytokeratin 20. Given the clinical history as well as the histologic features, the findings are consistent with metastatic endometrioid adenocarcinoma. (JBK:kh 05-19-13) Enid Cutter MD Pathologist, Electronic Signature ( Signed 05/19/2013) FINAL DIAGNOSIS Diagnosis Pelvis, biopsy, Central lower - METASTATIC CARCINOMA WITH ASSOCIATED TUMOR NECROSIS. Microscopic Comment Immunostains will be performed and an addendum report will follow. (HCL:kh 05-15-13) Aldona Bar MD Pathologist, Electronic Signature (Case signed 05/15/2013) Specimen Gross and Clinical Information Specimen(s) Obtained: Pelvis, biopsy, Central lower Specimen Clinical Information Recurrent cancer (tl) Gross Received in formalin are fragments and cores of soft white tissue having an aggregate measurement of 1.3 x 0.5 x 0.1 cm. The specimen is submitted in toto. (GRP: [id number] [id number]) 1 of 2 Duplicate copy FINAL for Lhommedieu, Tinslee P [id number]) Stain(s) used in Diagnosis: The following stain(s) were used in diagnosing the case: PR - NOACIS, ER - NOACIS, CK-7, CDX-2, CK 20. The control(s) stained appropriately. Disc   RADIOGRAPHIC STUDIES:  Dg Chest 2 View  05/06/2013   *RADIOLOGY REPORT*  Clinical Data: Endometrial cancer  CHEST - 2 VIEW  Comparison: December 15, 2011.  Findings: Cardiomediastinal silhouette appears normal.  No acute pulmonary disease is noted.  Bony  thorax is intact.  Stable small dense nodule seen in right lower lobe compared to prior exam most consistent with granuloma.  IMPRESSION: Stable small nodule seen in right lower lobe most consistent with granuloma.  No acute cardiopulmonary abnormality seen.   Original Report Authenticated By: Marijo Conception.,  M.D.   Ct Abdomen Pelvis W Contrast  05/05/2013   *RADIOLOGY REPORT*  Clinical Data: Pelvic pain.  History of endometrial carcinoma.  CT  ABDOMEN AND PELVIS WITH CONTRAST  Technique:  Multidetector CT imaging of the abdomen and pelvis was performed following the standard protocol during bolus administration of intravenous contrast.  Contrast: 145mL OMNIPAQUE IOHEXOL 300 MG/ML  SOLN  Comparison: CT 01/05/2006  Findings: Lung bases are clear.  No pericardial fluid.  No focal hepatic lesion.  The gallbladder, pancreas, spleen, adrenal glands, kidneys are normal.  There is a cortical scarring of the right kidney.  No hydronephrosis.  The stomach, small bowel, appendix, cecum are normal.  The colon is normal to the level of the descending colon.  There is a lobular mass at the vaginal cuff measuring 7.0 x 3.2 cm in axial dimension and approximately 4 cm craniocaudad dimension. There is a mass compresses the distal aspect of the sigmoid colon just above the rectum (image 62, axial, image 57, coronal series). No mass approximates the posterior wall the bladder without clear invasion .There is no evidence of obstruction of stool proximal to this mass compressing the sigmoid colon.  There is a large necrotic lymph node measuring 2.8 cm within the sigmoid mesocolon centrally (60).  Patient status post hysterectomy and oophorectomy.  Small bilateral external iliac lymph nodes.  For example 8 mm node on the right and 5 mm node on the left (image 59).  There is no periaortic adenopathy.  No periportal or mesenteric adenopathy  Review of the bone windows demonstrates post lumbar  fusion.  No aggressive osseous lesions.   IMPRESSION:  1.  Lobular mass at the vaginal cuff is consistent with endometrial carcinoma progression.  2.  Mass extends to the serosal surface of the sigmoid colon and compresses the lumen of the sigmoid colon.  No obstruction at this time.  3.  Mass  approximates the posterior wall of the bladder without clear invasion.  4. Large necrotic lymph node within the sigmoid mesocolon superior to the vaginal cuff.  5.  Small bilateral iliac lymph nodes.   Original Report Authenticated By: Suzy Bouchard, M.D.   Nm Pet Image Restag (ps) Skull Base To Thigh  05/09/2013   *RADIOLOGY REPORT*  Clinical Data: Subsequent treatment strategy for endometrial cancer. Hysterectomy 12/19/2011.  Recurrent pelvic masses on the CT.  NUCLEAR MEDICINE PET SKULL BASE TO THIGH  Fasting Blood Glucose:  141  Technique:  18.3 mCi F-18 FDG was injected intravenously. CT data was obtained and used for attenuation correction and anatomic localization only.  (This was not acquired as a diagnostic CT examination.) Additional exam technical data entered on technologist worksheet.  Comparison:  Abdominal pelvic CT 05/05/2013.  Findings:  Neck: No hypermetabolic lymph nodes in the neck.  Chest:  There are no hypermetabolic mediastinal or hilar lymph nodes.  There is no abnormal metabolic activity within the lungs. CT images demonstrate mild atherosclerosis.  There are no suspicious pulmonary findings.  Abdomen/Pelvis:  There is no abnormal metabolic activity within the liver, adrenal glands, spleen or pancreas.  No hypermetabolic lymph nodes are identified within the abdomen.  However, the large lobulated mass contiguous with the vaginal cuff is hypermetabolic. This demonstrates an SUV max of approximately 24.5.  The mass measures up to 6.8 x 3.6 cm transverse on image 186.  The necrotic nodal mass within the sigmoid the mesocolon is also hypermetabolic. This measures 3.1 cm on image 175 and demonstrates an SUV max of 17.1.  The right external  iliac and pelvic sidewall lymph nodes demonstrated on the recent CT are also hypermetabolic.  The largest node measures 2.0 cm  on image 176 and has an SUV max of 17.1.  No hypermetabolic left pelvic side wall nodes are identified.  There is no generalized peritoneal hypermetabolic activity or ascites. Scattered activity in the colon is likely physiologic.  The right kidney demonstrates lobularity and scarring.  Skeleton:  No focal hypermetabolic activity to suggest skeletal metastasis.  IMPRESSION:  1.  Multifocal recurrence within the pelvis involving the vaginal cuff, mesenteric and right pelvic sidewall lymph nodes. 2.  No distant metastases identified.   Original Report Authenticated By: Richardean Sale, M.D.   Ct Biopsy  05/14/2013   *RADIOLOGY REPORT*  Clinical history:68 year old with history of endometrial cancer and suspicious lesions in the pelvis.  PROCEDURE(S): CT GUIDED BIOPSY OF PELVIC MASS  Physician: Stephan Minister. Henn, MD  Medications:Versed 2 mg, Fentanyl 100 mcg.  A radiology nurse monitored the patient for moderate sedation.  Moderate sedation time:28 minutes  Procedure:The procedure was explained to the patient.  The risks and benefits of the procedure were discussed and the patient's questions were addressed.  Informed consent was obtained from the patient.  The patient was placed prone on the CT scanner.  Images of the pelvis were obtained.  The left gluteal area was prepped and draped in a sterile fashion.  Skin and soft tissues were anesthetized with lidocaine.  30 gauge needle was directed into the pelvic mass from a left transgluteal approach.  Needle position confirmed along the posterior aspect of the lesion.  Three core biopsies were obtained with an 18 gauge device.  Samples placed in formalin.  17 gauge needle was removed without complication.  Findings:There is a pelvic mass situated adjacent to the vaginal cuff.  The needle was positioned along the posterior aspect of the lesion.   Complications: None  Impression:CT guided core biopsies of the pelvic mass.   Original Report Authenticated By: Markus Daft, M.D.    ASSESSMENT/PLAN: 68 year old female with  #1 recurrent endometrioid adenocarcinoma of the uterus: Status post palliative chemotherapy consisting of Taxol and carboplatinum x6 cycles. She did have some residual disease. She therefore was begun on  Taxotere and carboplatinum starting 11/27/2013.   #2 we'll hold second cycle of chemotherapy today in view of thrombocytopenia  #3 Hypercalcemia: She discontinued oral calcium supplementation 3 weeks ago. Her calcium level today is 11.5. We'll  arrange for 2 L of half normal saline to be given today.We'll measure the ionized calcium and also intact PTH level after IV fluids. If necessary we will also arrange for  IV pamidronate. We'll also arrange for  bone scan to rule out bony metastasis, if she continued to have persistent hypercalcemia with normal intact PTH.  #4 Thrombocytopenia secondary to chemotherapy induced: Second cycle of chemotherapy was held  #5 pain controlled with   fentanyl patches as well as oxycodone.   #6 neuropathy: Patient is receiving gabapentin 200 mg 4 times a day.   Next visit 12/25/2013 for cycle #2 of Taxotere and carboplatinum.   All questions were answered. The patient knows to call the clinic with any problems, questions or concerns. We can certainly see the patient much sooner if necessary.  I spent 20 minutes counseling the patient face to face. The total time spent in the appointment was 30 minutes.  Wilmon Arms, MD Medical/Oncology [facility]

## 2013-12-18 NOTE — Progress Notes (Signed)
Pt express concerns that she was having nightmares, hairloss, and dry skin.  Wants to know if this is a side effect of the chemo (carboplatin and taxotere) that she is taking. Dr Earnest Conroy made aware of pt concerns.

## 2013-12-18 NOTE — Telephone Encounter (Signed)
Per staff message and POF I have scheduled appts.  JMW  

## 2013-12-19 ENCOUNTER — Telehealth: Payer: Self-pay

## 2013-12-19 ENCOUNTER — Ambulatory Visit: Payer: Medicare Other

## 2013-12-19 LAB — PTH, INTACT AND CALCIUM
Calcium: 10 mg/dL (ref 8.4–10.5)
PTH: 113.8 pg/mL — AB (ref 14.0–72.0)

## 2013-12-19 NOTE — Telephone Encounter (Signed)
Advised patient that PTH and calcium levels are high and Dr Earnest Conroy would like her to follow up with her PCP next week.  Patient said she would make an appointment for next week and labs were routed to Dr Matthias Hughs in [place].

## 2013-12-24 ENCOUNTER — Ambulatory Visit: Payer: Medicare Other

## 2013-12-25 ENCOUNTER — Ambulatory Visit (HOSPITAL_BASED_OUTPATIENT_CLINIC_OR_DEPARTMENT_OTHER): Payer: Medicare Other

## 2013-12-25 ENCOUNTER — Ambulatory Visit (HOSPITAL_BASED_OUTPATIENT_CLINIC_OR_DEPARTMENT_OTHER): Payer: Medicare Other | Admitting: Hematology and Oncology

## 2013-12-25 ENCOUNTER — Other Ambulatory Visit (HOSPITAL_BASED_OUTPATIENT_CLINIC_OR_DEPARTMENT_OTHER): Payer: Medicare Other

## 2013-12-25 DIAGNOSIS — G589 Mononeuropathy, unspecified: Secondary | ICD-10-CM

## 2013-12-25 DIAGNOSIS — E876 Hypokalemia: Secondary | ICD-10-CM

## 2013-12-25 DIAGNOSIS — N898 Other specified noninflammatory disorders of vagina: Secondary | ICD-10-CM

## 2013-12-25 DIAGNOSIS — Z5111 Encounter for antineoplastic chemotherapy: Secondary | ICD-10-CM

## 2013-12-25 DIAGNOSIS — C549 Malignant neoplasm of corpus uteri, unspecified: Secondary | ICD-10-CM

## 2013-12-25 DIAGNOSIS — C541 Malignant neoplasm of endometrium: Secondary | ICD-10-CM

## 2013-12-25 DIAGNOSIS — R109 Unspecified abdominal pain: Secondary | ICD-10-CM

## 2013-12-25 DIAGNOSIS — D6959 Other secondary thrombocytopenia: Secondary | ICD-10-CM

## 2013-12-25 LAB — CBC WITH DIFFERENTIAL/PLATELET
BASO%: 0.2 % (ref 0.0–2.0)
BASOS ABS: 0 10*3/uL (ref 0.0–0.1)
EOS%: 0 % (ref 0.0–7.0)
Eosinophils Absolute: 0 10*3/uL (ref 0.0–0.5)
HCT: 31.6 % — ABNORMAL LOW (ref 34.8–46.6)
HEMOGLOBIN: 10.6 g/dL — AB (ref 11.6–15.9)
LYMPH%: 8.7 % — ABNORMAL LOW (ref 14.0–49.7)
MCH: 33.9 pg (ref 25.1–34.0)
MCHC: 33.5 g/dL (ref 31.5–36.0)
MCV: 101 fL (ref 79.5–101.0)
MONO#: 0 10*3/uL — ABNORMAL LOW (ref 0.1–0.9)
MONO%: 0.6 % (ref 0.0–14.0)
NEUT#: 5.8 10*3/uL (ref 1.5–6.5)
NEUT%: 90.5 % — ABNORMAL HIGH (ref 38.4–76.8)
Platelets: 115 10*3/uL — ABNORMAL LOW (ref 145–400)
RBC: 3.13 10*6/uL — AB (ref 3.70–5.45)
RDW: 15 % — AB (ref 11.2–14.5)
WBC: 6.4 10*3/uL (ref 3.9–10.3)
lymph#: 0.6 10*3/uL — ABNORMAL LOW (ref 0.9–3.3)

## 2013-12-25 LAB — COMPREHENSIVE METABOLIC PANEL (CC13)
ALBUMIN: 4 g/dL (ref 3.5–5.0)
ALT: 13 U/L (ref 0–55)
AST: 16 U/L (ref 5–34)
Alkaline Phosphatase: 66 U/L (ref 40–150)
Anion Gap: 15 mEq/L — ABNORMAL HIGH (ref 3–11)
BUN: 22.6 mg/dL (ref 7.0–26.0)
CO2: 24 mEq/L (ref 22–29)
CREATININE: 1 mg/dL (ref 0.6–1.1)
Calcium: 10.7 mg/dL — ABNORMAL HIGH (ref 8.4–10.4)
Chloride: 104 mEq/L (ref 98–109)
Glucose: 195 mg/dl — ABNORMAL HIGH (ref 70–140)
POTASSIUM: 3.3 meq/L — AB (ref 3.5–5.1)
Sodium: 143 mEq/L (ref 136–145)
Total Bilirubin: 0.53 mg/dL (ref 0.20–1.20)
Total Protein: 6.9 g/dL (ref 6.4–8.3)

## 2013-12-25 MED ORDER — DEXAMETHASONE SODIUM PHOSPHATE 20 MG/5ML IJ SOLN
20.0000 mg | Freq: Once | INTRAMUSCULAR | Status: AC
  Administered 2013-12-25: 20 mg via INTRAVENOUS

## 2013-12-25 MED ORDER — DEXAMETHASONE SODIUM PHOSPHATE 20 MG/5ML IJ SOLN
INTRAMUSCULAR | Status: AC
  Filled 2013-12-25: qty 5

## 2013-12-25 MED ORDER — ONDANSETRON 16 MG/50ML IVPB (CHCC)
INTRAVENOUS | Status: AC
  Filled 2013-12-25: qty 16

## 2013-12-25 MED ORDER — SODIUM CHLORIDE 0.9 % IV SOLN
Freq: Once | INTRAVENOUS | Status: AC
  Administered 2013-12-25: 15:00:00 via INTRAVENOUS

## 2013-12-25 MED ORDER — SODIUM CHLORIDE 0.45 % IV SOLN
INTRAVENOUS | Status: AC
  Filled 2013-12-25: qty 1000

## 2013-12-25 MED ORDER — SODIUM CHLORIDE 0.9 % IV SOLN
75.0000 mg/m2 | Freq: Once | INTRAVENOUS | Status: AC
  Administered 2013-12-25: 120 mg via INTRAVENOUS
  Filled 2013-12-25: qty 12

## 2013-12-25 MED ORDER — HEPARIN SOD (PORK) LOCK FLUSH 100 UNIT/ML IV SOLN
500.0000 [IU] | Freq: Once | INTRAVENOUS | Status: AC | PRN
  Administered 2013-12-25: 500 [IU]
  Filled 2013-12-25: qty 5

## 2013-12-25 MED ORDER — CARBOPLATIN CHEMO INTRADERMAL TEST DOSE 100MCG/0.02ML
100.0000 ug | Freq: Once | INTRADERMAL | Status: AC
  Administered 2013-12-25: 100 ug via INTRADERMAL
  Filled 2013-12-25: qty 0.01

## 2013-12-25 MED ORDER — POTASSIUM CHLORIDE CRYS ER 20 MEQ PO TBCR: 20.0000 meq | EXTENDED_RELEASE_TABLET | Freq: Two times a day (BID) | ORAL | Status: DC

## 2013-12-25 MED ORDER — ONDANSETRON 16 MG/50ML IVPB (CHCC)
16.0000 mg | Freq: Once | INTRAVENOUS | Status: AC
  Administered 2013-12-25: 16 mg via INTRAVENOUS

## 2013-12-25 MED ORDER — SODIUM CHLORIDE 0.9 % IJ SOLN
10.0000 mL | INTRAMUSCULAR | Status: DC | PRN
  Administered 2013-12-25: 10 mL
  Filled 2013-12-25: qty 10

## 2013-12-25 NOTE — Progress Notes (Signed)
Carbo skin test right inner arm-red almost immediately after injected. Red & raised after 5" & 15".  Redness subsided some after 30" although still slightly red at injection side & raised.  Pharmacist/Chris observed & agreed ? Positive.  Spoke with Dr. Humphrey Rolls & she stated not to give carboplatin; give only taxotere.

## 2013-12-25 NOTE — Patient Instructions (Signed)
[facility] Discharge Instructions for Patients Receiving Chemotherapy  Today you received the following chemotherapy agents Taxotere.  To help prevent nausea and vomiting after your treatment, we encourage you to take your nausea medication.   If you develop nausea and vomiting that is not controlled by your nausea medication, call the clinic.   BELOW ARE SYMPTOMS THAT SHOULD BE REPORTED IMMEDIATELY:  *FEVER GREATER THAN 100.5 F  *CHILLS WITH OR WITHOUT FEVER  NAUSEA AND VOMITING THAT IS NOT CONTROLLED WITH YOUR NAUSEA MEDICATION  *UNUSUAL SHORTNESS OF BREATH  *UNUSUAL BRUISING OR BLEEDING  TENDERNESS IN MOUTH AND THROAT WITH OR WITHOUT PRESENCE OF ULCERS  *URINARY PROBLEMS  *BOWEL PROBLEMS  UNUSUAL RASH Items with * indicate a potential emergency and should be followed up as soon as possible.  Feel free to call the clinic you have any questions or concerns. The clinic phone number is [phone].    

## 2013-12-25 NOTE — Progress Notes (Signed)
OFFICE PROGRESS NOTE  CC  [name],[name], MD [place] [address] Dr. Threasa Heads Dr. 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 She had undergone a robotic assisted total laparoscopic hysterectomy bilateral salpingo-oophorectomy right pelvic lymph node biopsy on 12/19/2011 without any complications. 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 There was no involvement of other organs lymphovascular invasion was not identified. 3 lymph nodes were negative for metastatic disease. 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 Patient subsequently continued to be seen by gynecologic oncology. In October 2013 patient had a Pap smear that was normal limits there was noted to be some granulation tissue that was removed from the cough. There is no evidence of disease.  #2patient was seen by Dr. Dahlia Client on 04/29/2013 she had vaginal exam performed that was notable for a nodule in the vagina concerning for recurrence. A rectal examination revealed a firm 8 cm mass also. Because of this she had a biopsy performed. She also had a CT scan of the abdomen and pelvis ordered to further evaluate the sigmoid mass. The pathology came back inconclusive. However the CT of the abdomen and pelvis showed a lobular mass at the vagina cuff measuring 7.0 x 3.2 cm proximally 4 cm craniocaudal dimension. The mass compressed the distal aspect of the  sigmoid colon just above the rectum there was no mass approximates the posterior wall of the bladder without clear invasion noted evidence of obstruction of stool proximal to this mass. There was also noted to be a large necrotic lymph node measuring 2.8 cm within the sigmoid mesocolon centrally. There are also small bilateral external iliac lymph nodes. There were no aggressive osseous lesions.   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  #4 patient was begun on palliative chemotherapy starting on 06/03/2013 with Taxol carboplatinum. Total of 6 cycles of treatment as planned.  #5 pain control: She is being managed with fentanyl patches and oxycodone.   INTERVAL HISTORY: Pam Vazquez 68 y.o. female returns for initiation of cycle#2 chemotherapy with Taxotere and carboplatinum today. She does complain of fatigue, dry skin and also hair loss. Her nightmares are resolved now. She complains of lower back pain that she thinks may be she is getting  after Neulasta treatment. Her chemotherapy was held last week in view of thrombocytopenia. She denies any nausea or vomiting no fevers chills or night sweats. She  gets intermittent vaginal spotting and she has an appointment with gynecologist.  She is denying any headaches double vision blurring of vision.  In view of her elevated calcium level last week PTH level was done and it was elevated. Her calcium has normalized after the IV fluids  MEDICAL HISTORY: Past Medical History  Diagnosis Date  . Diabetes mellitus   . PONV (postoperative nausea and vomiting)   . Cancer     endometrial  . Angina     stress test on chart from 3/12/ none since March 2012  .  Chronic kidney disease     kidney stones  . Hypertension     / EKG 1/13 Epic    ALLERGIES:  has No Known Allergies.  MEDICATIONS:  Current Outpatient Prescriptions  Medication Sig Dispense Refill   . amLODipine (NORVASC) 2.5 MG tablet Take 2.5 mg by mouth every morning.       . cyanocobalamin 2000 MCG tablet Take 2,000 mcg by mouth 2 (two) times daily.      [name] dexamethasone (DECADRON) 4 MG tablet Take two tablets (8mg s) two times a day the day before Taxotere and for 3 days after chemotherapy. Don't take the day of Taxotere.  30 tablet  1  . doxycycline (VIBRA-TABS) 100 MG tablet Take 100 mg by mouth every other day.      . fentaNYL (DURAGESIC - DOSED MCG/HR) 25 MCG/HR patch Place 1 patch (25 mcg total) onto the skin every 3 (three) days.  10 patch  0  . gabapentin (NEURONTIN) 100 MG capsule Take 2 capsules (200 mg total) by mouth 3 (three) times daily.  180 capsule  1  . lidocaine-prilocaine (EMLA) cream Apply topically as needed.  30 g  6  . lisinopril-hydrochlorothiazide (PRINZIDE,ZESTORETIC) 20-25 MG per tablet Take 1 tablet by mouth daily after breakfast.       . loratadine (CLARITIN) 10 MG tablet Take 10 mg by mouth daily. Take as needed      . LORazepam (ATIVAN) 0.5 MG tablet Take 1 tablet (0.5 mg total) by mouth every 6 (six) hours as needed (Nausea or vomiting).  30 tablet  0  . metFORMIN (GLUCOPHAGE) 500 MG tablet Take 500 mg by mouth 2 (two) times daily.       . metoprolol tartrate (LOPRESSOR) 25 MG tablet Take 25 mg by mouth 2 (two) times daily.       . Multiple Vitamin (MULTIVITAMIN WITH MINERALS) TABS Take 1 tablet by mouth every morning. She takes Dance movement psychotherapist.      [name] omeprazole (PRILOSEC) 40 MG capsule Take 1 capsule (40 mg total) by mouth daily.  30 capsule  6  . ondansetron (ZOFRAN) 8 MG tablet Take 1 tablet (8 mg total) by mouth 2 (two) times daily. Take two times a day starting the day after chemo for 3 days. Then take two times a day as needed for nausea or vomiting.  30 tablet  1  . potassium chloride SA (K-DUR,KLOR-CON) 20 MEQ tablet Take 1 tablet (20 mEq total) by mouth 2 (two) times daily.  20 tablet  0  . prochlorperazine (COMPAZINE) 10 MG tablet Take 1 tablet (10  mg total) by mouth every 6 (six) hours as needed (Nausea or vomiting).  30 tablet  1   Current Facility-Administered Medications  Medication Dose Route Frequency Provider Last Rate Last Dose  . 0.45 % sodium chloride infusion   Intravenous Continuous Wilmon Arms, MD       Facility-Administered Medications Ordered in Other Visits  Medication Dose Route Frequency Provider Last Rate Last Dose  . DOCEtaxel (TAXOTERE) 120 mg in sodium chloride 0.9 % 250 mL chemo infusion  75 mg/m2 (Treatment Plan Actual) Intravenous Once [name], MD 262 mL/hr at 12/25/13 1626 120 mg at 12/25/13 1626  . heparin lock flush 100 unit/mL  500 Units Intracatheter Once PRN [name], MD      . sodium chloride 0.9 % injection 10 mL  10 mL Intracatheter PRN [name], MD        SURGICAL HISTORY:  Past  Surgical History  Procedure Laterality Date  . Hysteroscopy w/d&c  11/21/2011    Procedure: DILATATION AND CURETTAGE /HYSTEROSCOPY;  Surgeon: Gus Height;  Location: [facility];  Service: Gynecology;  Laterality: N/A;  . Tubal ligation    . Breast mass removal  1984, 1992    L breast  . Lithotripsy  2000, 2002  . Back surgery      91 and 2001  . Dilation and curettage of uterus    . Node dissection  12/19/2011    Procedure: NODE DISSECTION;  Surgeon: [name], MD PHD;  Location: [facility];  Service: Gynecology;  Laterality: N/A;    REVIEW OF SYSTEMS:  A 10 point review of systems was conducted and is otherwise negative except for what is noted above.    PHYSICAL EXAMINATION: Blood pressure 130/71, pulse 94, temperature 98 F (36.7 C), temperature source Oral, resp. rate 18, height 5' (1.524 m), weight 144 lb 9.6 oz (65.59 kg). Body mass index is 28.24 kg/(m^2). General: Patient is a well appearing female in no acute distress HEENT: PERRLA, sclerae anicteric no conjunctival pallor, MMM Neck: supple, no palpable adenopathy Lungs: clear to auscultation bilaterally, no wheezes, rhonchi, or  rales Cardiovascular: regular rate rhythm, S1, S2, no murmurs, rubs or gallops Abdomen: Soft, non-tender, non-distended, normoactive bowel sounds, no HSM Extremities: warm and well perfused, no clubbing, cyanosis, or edema Skin: No rashes or lesions Neuro: Non-focal ECOG PERFORMANCE STATUS: 0 - Asymptomatic  LABORATORY DATA: Lab Results  Component Value Date   WBC 6.4 12/25/2013   HGB 10.6* 12/25/2013   HCT 31.6* 12/25/2013   MCV 101.0 12/25/2013   PLT 115* 12/25/2013      Chemistry      Component Value Date/Time   NA 143 12/25/2013 1217   NA 141 12/20/2011 0334   K 3.3* 12/25/2013 1217   K 4.1 12/20/2011 0334   CL 104 04/29/2013 1229   CL 104 12/20/2011 0334   CO2 24 12/25/2013 1217   CO2 27 12/20/2011 0334   BUN 22.6 12/25/2013 1217   BUN 10 12/20/2011 0334   CREATININE 1.0 12/25/2013 1217   CREATININE 0.80 12/20/2011 0334      Component Value Date/Time   CALCIUM 10.7* 12/25/2013 1217   CALCIUM 10.0 12/18/2013 1015   ALKPHOS 66 12/25/2013 1217   ALKPHOS 69 12/15/2011 1125   AST 16 12/25/2013 1217   AST 22 12/15/2011 1125   ALT 13 12/25/2013 1217   ALT 19 12/15/2011 1125   BILITOT 0.53 12/25/2013 1217   BILITOT 0.8 12/15/2011 1125    ADDITIONAL INFORMATION: The malignant cells are positive for estrogen receptor, progesterone receptor, cytokeratin 7 and focally positive for CDX-2. They are negative for cytokeratin 20. Given the clinical history as well as the histologic features, the findings are consistent with metastatic endometrioid adenocarcinoma. (JBK:kh 05-19-13) Enid Cutter MD Pathologist, Electronic Signature ( Signed 05/19/2013) FINAL DIAGNOSIS Diagnosis Pelvis, biopsy, Central lower - METASTATIC CARCINOMA WITH ASSOCIATED TUMOR NECROSIS. Microscopic Comment Immunostains will be performed and an addendum report will follow. (HCL:kh 05-15-13) Aldona Bar MD Pathologist, Electronic Signature (Case signed 05/15/2013) Specimen Gross and Clinical Information Specimen(s) Obtained: Pelvis,  biopsy, Central lower Specimen Clinical Information Recurrent cancer (tl) Gross Received in formalin are fragments and cores of soft white tissue having an aggregate measurement of 1.3 x 0.5 x 0.1 cm. The specimen is submitted in toto. (GRP: [id number] [id number]) 1 of 2 Duplicate copy FINAL for Copland, Doranne P [id number]) Stain(s) used in Diagnosis: The following stain(s)  were used in diagnosing the case: PR - NOACIS, ER - NOACIS, CK-7, CDX-2, CK 20. The control(s) stained appropriately. Disc   RADIOGRAPHIC STUDIES:  Dg Chest 2 View  05/06/2013   *RADIOLOGY REPORT*  Clinical Data: Endometrial cancer  CHEST - 2 VIEW  Comparison: December 15, 2011.  Findings: Cardiomediastinal silhouette appears normal.  No acute pulmonary disease is noted.  Bony thorax is intact.  Stable small dense nodule seen in right lower lobe compared to prior exam most consistent with granuloma.  IMPRESSION: Stable small nodule seen in right lower lobe most consistent with granuloma.  No acute cardiopulmonary abnormality seen.   Original Report Authenticated By: Marijo Conception.,  M.D.   Ct Abdomen Pelvis W Contrast  05/05/2013   *RADIOLOGY REPORT*  Clinical Data: Pelvic pain.  History of endometrial carcinoma.  CT ABDOMEN AND PELVIS WITH CONTRAST  Technique:  Multidetector CT imaging of the abdomen and pelvis was performed following the standard protocol during bolus administration of intravenous contrast.  Contrast: 12mL OMNIPAQUE IOHEXOL 300 MG/ML  SOLN  Comparison: CT 01/05/2006  Findings: Lung bases are clear.  No pericardial fluid.  No focal hepatic lesion.  The gallbladder, pancreas, spleen, adrenal glands, kidneys are normal.  There is a cortical scarring of the right kidney.  No hydronephrosis.  The stomach, small bowel, appendix, cecum are normal.  The colon is normal to the level of the descending colon.  There is a lobular mass at the vaginal cuff measuring 7.0 x 3.2 cm in axial dimension and approximately 4 cm  craniocaudad dimension. There is a mass compresses the distal aspect of the sigmoid colon just above the rectum (image 62, axial, image 57, coronal series). No mass approximates the posterior wall the bladder without clear invasion .There is no evidence of obstruction of stool proximal to this mass compressing the sigmoid colon.  There is a large necrotic lymph node measuring 2.8 cm within the sigmoid mesocolon centrally (60).  Patient status post hysterectomy and oophorectomy.  Small bilateral external iliac lymph nodes.  For example 8 mm node on the right and 5 mm node on the left (image 59).  There is no periaortic adenopathy.  No periportal or mesenteric adenopathy  Review of the bone windows demonstrates post lumbar  fusion.  No aggressive osseous lesions.  IMPRESSION:  1.  Lobular mass at the vaginal cuff is consistent with endometrial carcinoma progression.  2.  Mass extends to the serosal surface of the sigmoid colon and compresses the lumen of the sigmoid colon.  No obstruction at this time.  3.  Mass  approximates the posterior wall of the bladder without clear invasion.  4. Large necrotic lymph node within the sigmoid mesocolon superior to the vaginal cuff.  5.  Small bilateral iliac lymph nodes.   Original Report Authenticated By: Suzy Bouchard, M.D.   Nm Pet Image Restag (ps) Skull Base To Thigh  05/09/2013   *RADIOLOGY REPORT*  Clinical Data: Subsequent treatment strategy for endometrial cancer. Hysterectomy 12/19/2011.  Recurrent pelvic masses on the CT.  NUCLEAR MEDICINE PET SKULL BASE TO THIGH  Fasting Blood Glucose:  141  Technique:  18.3 mCi F-18 FDG was injected intravenously. CT data was obtained and used for attenuation correction and anatomic localization only.  (This was not acquired as a diagnostic CT examination.) Additional exam technical data entered on technologist worksheet.  Comparison:  Abdominal pelvic CT 05/05/2013.  Findings:  Neck: No hypermetabolic lymph nodes in the neck.   Chest:  There are  no hypermetabolic mediastinal or hilar lymph nodes.  There is no abnormal metabolic activity within the lungs. CT images demonstrate mild atherosclerosis.  There are no suspicious pulmonary findings.  Abdomen/Pelvis:  There is no abnormal metabolic activity within the liver, adrenal glands, spleen or pancreas.  No hypermetabolic lymph nodes are identified within the abdomen.  However, the large lobulated mass contiguous with the vaginal cuff is hypermetabolic. This demonstrates an SUV max of approximately 24.5.  The mass measures up to 6.8 x 3.6 cm transverse on image 186.  The necrotic nodal mass within the sigmoid the mesocolon is also hypermetabolic. This measures 3.1 cm on image 175 and demonstrates an SUV max of 17.1.  The right external iliac and pelvic sidewall lymph nodes demonstrated on the recent CT are also hypermetabolic.  The largest node measures 2.0 cm on image 176 and has an SUV max of 17.1.  No hypermetabolic left pelvic side wall nodes are identified.  There is no generalized peritoneal hypermetabolic activity or ascites. Scattered activity in the colon is likely physiologic.  The right kidney demonstrates lobularity and scarring.  Skeleton:  No focal hypermetabolic activity to suggest skeletal metastasis.  IMPRESSION:  1.  Multifocal recurrence within the pelvis involving the vaginal cuff, mesenteric and right pelvic sidewall lymph nodes. 2.  No distant metastases identified.   Original Report Authenticated By: Richardean Sale, M.D.   Ct Biopsy  05/14/2013   *RADIOLOGY REPORT*  Clinical history:68 year old with history of endometrial cancer and suspicious lesions in the pelvis.  PROCEDURE(S): CT GUIDED BIOPSY OF PELVIC MASS  Physician: Stephan Minister. Henn, MD  Medications:Versed 2 mg, Fentanyl 100 mcg.  A radiology nurse monitored the patient for moderate sedation.  Moderate sedation time:28 minutes  Procedure:The procedure was explained to the patient.  The risks and benefits of the  procedure were discussed and the patient's questions were addressed.  Informed consent was obtained from the patient.  The patient was placed prone on the CT scanner.  Images of the pelvis were obtained.  The left gluteal area was prepped and draped in a sterile fashion.  Skin and soft tissues were anesthetized with lidocaine.  72 gauge needle was directed into the pelvic mass from a left transgluteal approach.  Needle position confirmed along the posterior aspect of the lesion.  Three core biopsies were obtained with an 18 gauge device.  Samples placed in formalin.  17 gauge needle was removed without complication.  Findings:There is a pelvic mass situated adjacent to the vaginal cuff.  The needle was positioned along the posterior aspect of the lesion.  Complications: None  Impression:CT guided core biopsies of the pelvic mass.   Original Report Authenticated By: Markus Daft, M.D.    ASSESSMENT/PLAN: 68 year old female with  #1 recurrent endometrioid adenocarcinoma of the uterus: Status post palliative chemotherapy consisting of Taxol and carboplatinum x6 cycles. She did have some residual disease. She therefore was begun on  Taxotere and carboplatinum starting 11/27/2013.   #2  we will initiate second cycle of chemotherapy  withTaxotere and carboplatinum today with 15% dose reduction on carboplatinum in view of thrombocytopenia. 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 I have asked the patient to follow up with primary care physician for further management of elevated PTH level. We'll arrange for half liter of half normal saline after the chemotherapy her calcium level of 10.7 today. We have faxed the lab  results to the primary care physician.  #4 Thrombocytopenia secondary to chemotherapy induced:Her platelet count is improved today   #5 pain controlled with   fentanyl patches as  well as oxycodone.   #6 neuropathy: Patient is receiving gabapentin 200 mg 4 times a day.  #7  intermittent vaginal spotting follow up with gynecology as scheduled  #8 Neulasta injection tomorrow  #9 hypokalemia: Replaced with po KCl   follow up in 1 week with a CBC differential, CMP   All questions were answered. The patient knows to call the clinic with any problems, questions or concerns. We can certainly see the patient much sooner if necessary.  I spent 20 minutes counseling the patient face to face. The total time spent in the appointment was 30 minutes.  Wilmon Arms, MD Medical/Oncology [facility]

## 2013-12-26 ENCOUNTER — Other Ambulatory Visit: Payer: Self-pay

## 2013-12-26 ENCOUNTER — Telehealth: Payer: Self-pay | Admitting: *Deleted

## 2013-12-26 ENCOUNTER — Ambulatory Visit: Payer: Medicare Other

## 2013-12-26 ENCOUNTER — Other Ambulatory Visit: Payer: Self-pay | Admitting: *Deleted

## 2013-12-26 ENCOUNTER — Ambulatory Visit (HOSPITAL_BASED_OUTPATIENT_CLINIC_OR_DEPARTMENT_OTHER): Payer: Medicare Other

## 2013-12-26 VITALS — BP 130/65 | HR 80 | Temp 98.8°F

## 2013-12-26 DIAGNOSIS — C549 Malignant neoplasm of corpus uteri, unspecified: Secondary | ICD-10-CM

## 2013-12-26 DIAGNOSIS — C541 Malignant neoplasm of endometrium: Secondary | ICD-10-CM

## 2013-12-26 MED ORDER — PEGFILGRASTIM INJECTION 6 MG/0.6ML
6.0000 mg | Freq: Once | SUBCUTANEOUS | Status: AC
  Administered 2013-12-26: 6 mg via SUBCUTANEOUS
  Filled 2013-12-26: qty 0.6

## 2013-12-26 NOTE — Telephone Encounter (Signed)
Per staff message and POF I have scheduled appts.  JMW  

## 2013-12-26 NOTE — Telephone Encounter (Signed)
appts made and printed...td 

## 2013-12-31 ENCOUNTER — Telehealth: Payer: Self-pay | Admitting: Oncology

## 2013-12-31 NOTE — Telephone Encounter (Signed)
Pt called and r/s  appt for lab and ML to 01/05/14 due to weather

## 2014-01-01 ENCOUNTER — Other Ambulatory Visit: Payer: Medicare Other

## 2014-01-01 ENCOUNTER — Ambulatory Visit: Payer: Medicare Other | Admitting: Adult Health

## 2014-01-05 ENCOUNTER — Ambulatory Visit (HOSPITAL_BASED_OUTPATIENT_CLINIC_OR_DEPARTMENT_OTHER): Payer: Medicare Other | Admitting: Adult Health

## 2014-01-05 ENCOUNTER — Encounter: Payer: Self-pay | Admitting: Adult Health

## 2014-01-05 ENCOUNTER — Other Ambulatory Visit (HOSPITAL_BASED_OUTPATIENT_CLINIC_OR_DEPARTMENT_OTHER): Payer: Medicare Other

## 2014-01-05 VITALS — BP 99/63 | HR 85 | Temp 98.3°F | Resp 18 | Ht 60.0 in | Wt 144.7 lb

## 2014-01-05 DIAGNOSIS — C549 Malignant neoplasm of corpus uteri, unspecified: Secondary | ICD-10-CM

## 2014-01-05 DIAGNOSIS — C541 Malignant neoplasm of endometrium: Secondary | ICD-10-CM

## 2014-01-05 DIAGNOSIS — E213 Hyperparathyroidism, unspecified: Secondary | ICD-10-CM

## 2014-01-05 DIAGNOSIS — E876 Hypokalemia: Secondary | ICD-10-CM

## 2014-01-05 DIAGNOSIS — G589 Mononeuropathy, unspecified: Secondary | ICD-10-CM

## 2014-01-05 DIAGNOSIS — R109 Unspecified abdominal pain: Secondary | ICD-10-CM

## 2014-01-05 DIAGNOSIS — N898 Other specified noninflammatory disorders of vagina: Secondary | ICD-10-CM

## 2014-01-05 LAB — CBC WITH DIFFERENTIAL/PLATELET
BASO%: 0.7 % (ref 0.0–2.0)
BASOS ABS: 0.2 10*3/uL — AB (ref 0.0–0.1)
EOS%: 0 % (ref 0.0–7.0)
Eosinophils Absolute: 0 10*3/uL (ref 0.0–0.5)
HCT: 31 % — ABNORMAL LOW (ref 34.8–46.6)
HEMOGLOBIN: 10.3 g/dL — AB (ref 11.6–15.9)
LYMPH#: 4.1 10*3/uL — AB (ref 0.9–3.3)
LYMPH%: 15.4 % (ref 14.0–49.7)
MCH: 34.8 pg — ABNORMAL HIGH (ref 25.1–34.0)
MCHC: 33.3 g/dL (ref 31.5–36.0)
MCV: 104.4 fL — ABNORMAL HIGH (ref 79.5–101.0)
MONO#: 1.4 10*3/uL — ABNORMAL HIGH (ref 0.1–0.9)
MONO%: 5.3 % (ref 0.0–14.0)
NEUT#: 21.1 10*3/uL — ABNORMAL HIGH (ref 1.5–6.5)
NEUT%: 78.6 % — ABNORMAL HIGH (ref 38.4–76.8)
Platelets: 169 10*3/uL (ref 145–400)
RBC: 2.97 10*6/uL — ABNORMAL LOW (ref 3.70–5.45)
RDW: 16.3 % — ABNORMAL HIGH (ref 11.2–14.5)
WBC: 26.9 10*3/uL — ABNORMAL HIGH (ref 3.9–10.3)

## 2014-01-05 LAB — COMPREHENSIVE METABOLIC PANEL (CC13)
ALK PHOS: 113 U/L (ref 40–150)
ALT: 14 U/L (ref 0–55)
AST: 17 U/L (ref 5–34)
Albumin: 3.6 g/dL (ref 3.5–5.0)
Anion Gap: 14 mEq/L — ABNORMAL HIGH (ref 3–11)
BILIRUBIN TOTAL: 0.34 mg/dL (ref 0.20–1.20)
BUN: 22.1 mg/dL (ref 7.0–26.0)
CALCIUM: 10.3 mg/dL (ref 8.4–10.4)
CHLORIDE: 102 meq/L (ref 98–109)
CO2: 25 mEq/L (ref 22–29)
CREATININE: 1.2 mg/dL — AB (ref 0.6–1.1)
Glucose: 113 mg/dl (ref 70–140)
Potassium: 4.1 mEq/L (ref 3.5–5.1)
Sodium: 141 mEq/L (ref 136–145)
Total Protein: 6.7 g/dL (ref 6.4–8.3)

## 2014-01-05 MED ORDER — FENTANYL 25 MCG/HR TD PT72: 25.0000 ug | MEDICATED_PATCH | TRANSDERMAL | Status: DC

## 2014-01-05 NOTE — Progress Notes (Signed)
OFFICE PROGRESS NOTE  CC  [name],[name], [place] [id number] Dr. Dyke Maes [name] 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 She had undergone a robotic assisted total laparoscopic hysterectomy bilateral salpingo-oophorectomy right pelvic lymph node biopsy on 12/19/2011 without any complications. 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 There was no involvement of other organs lymphovascular invasion was not identified. 3 lymph nodes were negative for metastatic disease. 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 Patient subsequently continued to be seen by gynecologic oncology. In October 2013 patient had a Pap smear that was normal limits there was noted to be some granulation tissue that was removed from the cough. There is no evidence of disease.  #2patient was seen by Dr. Dahlia Client on 04/29/2013 she had vaginal exam performed that was notable for a nodule in the vagina concerning for recurrence. A rectal examination revealed a firm 8 cm mass also. Because of this she had a biopsy performed. She also had a CT scan of the abdomen and pelvis ordered to further evaluate the sigmoid mass. The pathology came back inconclusive. However the CT of the abdomen and pelvis showed a lobular mass at the vagina cuff measuring 7.0 x 3.2 cm proximally 4 cm craniocaudal dimension. The mass compressed the distal aspect of the sigmoid colon just above the rectum there was no mass approximates the posterior wall of the  bladder without clear invasion noted evidence of obstruction of stool proximal to this mass. There was also noted to be a large necrotic lymph node measuring 2.8 cm within the sigmoid mesocolon centrally. There are also small bilateral external iliac lymph nodes. There were no aggressive osseous lesions.   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  #4 patient is s/p palliative chemotherapy starting on 06/03/2013 with Taxol carboplatinum. She received 6 cycles and was re-imaged on 10/14/2013.  There was an interval decrease in tumor burden therefore Dr. [name] recommended 3 additional cycles of Taxotere/Carboplatin (Ms. Like did develop mild neuropathy with taxol).  She started Taxotere/Carbo on 11/27/13.    #5 pain control: She is being managed with fentanyl patches and oxycodone.   INTERVAL HISTORY: Pam Vazquez 68 y.o. female returns for evaluation after receiving cycle 2 of taxotere/carboplatin.  She is doing moderately well today.  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  She is not sleeping well.  She does not wish to take any medication to help her.  Her pain continues to be managed with Fentanyl patches. She has occasional vaginal spotting and is following up with Dr. [name] about this on 02/05/14.  Her numbness is stable and continues to be managed with Neurontin four times a day.  Otherwise, a 10 point ROS is negative.   MEDICAL HISTORY: Past Medical History  Diagnosis Date  . Diabetes mellitus   . PONV (postoperative nausea and vomiting)   . Cancer     endometrial  . Angina     stress  test on chart from 3/12/ none since March 2012  . Chronic kidney disease     kidney stones  . Hypertension     / EKG 1/13 Epic    ALLERGIES:  has No Known Allergies.  MEDICATIONS:  Current Outpatient Prescriptions   Medication Sig Dispense Refill  . amLODipine (NORVASC) 2.5 MG tablet Take 2.5 mg by mouth every morning.       . cyanocobalamin 2000 MCG tablet Take 2,000 mcg by mouth 2 (two) times daily.      [name] doxycycline (VIBRA-TABS) 100 MG tablet Take 100 mg by mouth every other day.      . fentaNYL (DURAGESIC - DOSED MCG/HR) 25 MCG/HR patch Place 1 patch (25 mcg total) onto the skin every 3 (three) days.  10 patch  0  . gabapentin (NEURONTIN) 100 MG capsule Take 2 capsules (200 mg total) by mouth 3 (three) times daily.  180 capsule  1  . lidocaine-prilocaine (EMLA) cream Apply topically as needed.  30 g  6  . lisinopril-hydrochlorothiazide (PRINZIDE,ZESTORETIC) 20-25 MG per tablet Take 1 tablet by mouth daily after breakfast.       . loratadine (CLARITIN) 10 MG tablet Take 10 mg by mouth daily. Take as needed      . LORazepam (ATIVAN) 0.5 MG tablet Take 1 tablet (0.5 mg total) by mouth every 6 (six) hours as needed (Nausea or vomiting).  30 tablet  0  . metFORMIN (GLUCOPHAGE) 500 MG tablet Take 500 mg by mouth 2 (two) times daily.       . metoprolol tartrate (LOPRESSOR) 25 MG tablet Take 25 mg by mouth 2 (two) times daily.       . Multiple Vitamin (MULTIVITAMIN WITH MINERALS) TABS Take 1 tablet by mouth every morning. She takes Dance movement psychotherapist.      [name] omeprazole (PRILOSEC) 40 MG capsule Take 1 capsule (40 mg total) by mouth daily.  30 capsule  6  . ondansetron (ZOFRAN) 8 MG tablet Take 1 tablet (8 mg total) by mouth 2 (two) times daily. Take two times a day starting the day after chemo for 3 days. Then take two times a day as needed for nausea or vomiting.  30 tablet  1  . dexamethasone (DECADRON) 4 MG tablet Take two tablets (8mg s) two times a day the day before Taxotere and for 3 days after chemotherapy. Don't take the day of Taxotere.  30 tablet  1  . potassium chloride SA (K-DUR,KLOR-CON) 20 MEQ tablet Take 1 tablet (20 mEq total) by mouth 2 (two) times daily.  20 tablet  0  . prochlorperazine (COMPAZINE)  10 MG tablet Take 1 tablet (10 mg total) by mouth every 6 (six) hours as needed (Nausea or vomiting).  30 tablet  1   No current facility-administered medications for this visit.    SURGICAL HISTORY:  Past Surgical History  Procedure Laterality Date  . Hysteroscopy w/d&c  11/21/2011    Procedure: DILATATION AND CURETTAGE /HYSTEROSCOPY;  Surgeon: Gus Height;  Location: [facility];  Service: Gynecology;  Laterality: N/A;  . Tubal ligation    . Breast mass removal  1984, 1992    L breast  . Lithotripsy  2000, 2002  . Back surgery      91 and 2001  . Dilation and curettage of uterus    . Node dissection  12/19/2011    Procedure: NODE DISSECTION;  Surgeon: [name], MD PHD;  Location: [facility];  Service: Gynecology;  Laterality: N/A;  REVIEW OF SYSTEMS:  A 10 point review of systems was conducted and is otherwise negative except for what is noted above.    PHYSICAL EXAMINATION: Blood pressure 99/63, pulse 85, temperature 98.3 F (36.8 C), temperature source Oral, resp. rate 18, height 5' (1.524 m), weight 144 lb 11.2 oz (65.635 kg). Body mass index is 28.26 kg/(m^2). GENERAL: Patient is a well appearing female in no acute distress HEENT:  Sclerae anicteric.  Oropharynx clear and moist. No ulcerations or evidence of oropharyngeal candidiasis. Neck is supple.  NODES:  No cervical, supraclavicular, or axillary lymphadenopathy palpated.  BREAST EXAM:  Deferred. LUNGS:  Clear to auscultation bilaterally.  No wheezes or rhonchi. HEART:  Regular rate and rhythm. No murmur appreciated. ABDOMEN:  Soft, nontender.  Positive, normoactive bowel sounds. No organomegaly palpated. MSK:  No focal spinal tenderness to palpation. Full range of motion bilaterally in the upper extremities. EXTREMITIES:  No peripheral edema.   SKIN:  Clear with no obvious rashes or skin changes. No nail dyscrasia. NEURO:  Nonfocal. Well oriented.  Appropriate affect. ECOG PERFORMANCE STATUS: 0 - Asymptomatic  LABORATORY  DATA: Lab Results  Component Value Date   WBC 26.9* 01/05/2014   HGB 10.3* 01/05/2014   HCT 31.0* 01/05/2014   MCV 104.4* 01/05/2014   PLT 169 01/05/2014      Chemistry      Component Value Date/Time   NA 141 01/05/2014 1301   NA 141 12/20/2011 0334   K 4.1 01/05/2014 1301   K 4.1 12/20/2011 0334   CL 104 04/29/2013 1229   CL 104 12/20/2011 0334   CO2 25 01/05/2014 1301   CO2 27 12/20/2011 0334   BUN 22.1 01/05/2014 1301   BUN 10 12/20/2011 0334   CREATININE 1.2* 01/05/2014 1301   CREATININE 0.80 12/20/2011 0334      Component Value Date/Time   CALCIUM 10.3 01/05/2014 1301   CALCIUM 10.0 12/18/2013 1015   ALKPHOS 113 01/05/2014 1301   ALKPHOS 69 12/15/2011 1125   AST 17 01/05/2014 1301   AST 22 12/15/2011 1125   ALT 14 01/05/2014 1301   ALT 19 12/15/2011 1125   BILITOT 0.34 01/05/2014 1301   BILITOT 0.8 12/15/2011 1125    ADDITIONAL INFORMATION: The malignant cells are positive for estrogen receptor, progesterone receptor, cytokeratin 7 and focally positive for CDX-2. They are negative for cytokeratin 20. Given the clinical history as well as the histologic features, the findings are consistent with metastatic endometrioid adenocarcinoma. (JBK:kh 05-19-13) Enid Cutter MD Pathologist, Electronic Signature ( Signed 05/19/2013) FINAL DIAGNOSIS Diagnosis Pelvis, biopsy, Central lower - METASTATIC CARCINOMA WITH ASSOCIATED TUMOR NECROSIS. Microscopic Comment Immunostains will be performed and an addendum report will follow. (HCL:kh 05-15-13) Aldona Bar MD Pathologist, Electronic Signature (Case signed 05/15/2013) Specimen Gross and Clinical Information Specimen(s) Obtained: Pelvis, biopsy, Central lower Specimen Clinical Information Recurrent cancer (tl) Gross Received in formalin are fragments and cores of soft white tissue having an aggregate measurement of 1.3 x 0.5 x 0.1 cm. The specimen is submitted in toto. (GRP: [id number] [id number]) 1 of 2 Duplicate copy FINAL for Pettet, Nyelah P  [id number]) Stain(s) used in Diagnosis: The following stain(s) were used in diagnosing the case: PR - NOACIS, ER - NOACIS, CK-7, CDX-2, CK 20. The control(s) stained appropriately. Disc   RADIOGRAPHIC STUDIES:  Dg Chest 2 View  05/06/2013   *RADIOLOGY REPORT*  Clinical Data: Endometrial cancer  CHEST - 2 VIEW  Comparison: December 15, 2011.  Findings: Cardiomediastinal silhouette appears normal.  No acute  pulmonary disease is noted.  Bony thorax is intact.  Stable small dense nodule seen in right lower lobe compared to prior exam most consistent with granuloma.  IMPRESSION: Stable small nodule seen in right lower lobe most consistent with granuloma.  No acute cardiopulmonary abnormality seen.   Original Report Authenticated By: Marijo Conception.,  M.D.   Ct Abdomen Pelvis W Contrast  05/05/2013   *RADIOLOGY REPORT*  Clinical Data: Pelvic pain.  History of endometrial carcinoma.  CT ABDOMEN AND PELVIS WITH CONTRAST  Technique:  Multidetector CT imaging of the abdomen and pelvis was performed following the standard protocol during bolus administration of intravenous contrast.  Contrast: 168mL OMNIPAQUE IOHEXOL 300 MG/ML  SOLN  Comparison: CT 01/05/2006  Findings: Lung bases are clear.  No pericardial fluid.  No focal hepatic lesion.  The gallbladder, pancreas, spleen, adrenal glands, kidneys are normal.  There is a cortical scarring of the right kidney.  No hydronephrosis.  The stomach, small bowel, appendix, cecum are normal.  The colon is normal to the level of the descending colon.  There is a lobular mass at the vaginal cuff measuring 7.0 x 3.2 cm in axial dimension and approximately 4 cm craniocaudad dimension. There is a mass compresses the distal aspect of the sigmoid colon just above the rectum (image 62, axial, image 57, coronal series). No mass approximates the posterior wall the bladder without clear invasion .There is no evidence of obstruction of stool proximal to this mass compressing the  sigmoid colon.  There is a large necrotic lymph node measuring 2.8 cm within the sigmoid mesocolon centrally (60).  Patient status post hysterectomy and oophorectomy.  Small bilateral external iliac lymph nodes.  For example 8 mm node on the right and 5 mm node on the left (image 59).  There is no periaortic adenopathy.  No periportal or mesenteric adenopathy  Review of the bone windows demonstrates post lumbar  fusion.  No aggressive osseous lesions.  IMPRESSION:  1.  Lobular mass at the vaginal cuff is consistent with endometrial carcinoma progression.  2.  Mass extends to the serosal surface of the sigmoid colon and compresses the lumen of the sigmoid colon.  No obstruction at this time.  3.  Mass  approximates the posterior wall of the bladder without clear invasion.  4. Large necrotic lymph node within the sigmoid mesocolon superior to the vaginal cuff.  5.  Small bilateral iliac lymph nodes.   Original Report Authenticated By: Suzy Bouchard, M.D.   Nm Pet Image Restag (ps) Skull Base To Thigh  05/09/2013   *RADIOLOGY REPORT*  Clinical Data: Subsequent treatment strategy for endometrial cancer. Hysterectomy 12/19/2011.  Recurrent pelvic masses on the CT.  NUCLEAR MEDICINE PET SKULL BASE TO THIGH  Fasting Blood Glucose:  141  Technique:  18.3 mCi F-18 FDG was injected intravenously. CT data was obtained and used for attenuation correction and anatomic localization only.  (This was not acquired as a diagnostic CT examination.) Additional exam technical data entered on technologist worksheet.  Comparison:  Abdominal pelvic CT 05/05/2013.  Findings:  Neck: No hypermetabolic lymph nodes in the neck.  Chest:  There are no hypermetabolic mediastinal or hilar lymph nodes.  There is no abnormal metabolic activity within the lungs. CT images demonstrate mild atherosclerosis.  There are no suspicious pulmonary findings.  Abdomen/Pelvis:  There is no abnormal metabolic activity within the liver, adrenal glands, spleen  or pancreas.  No hypermetabolic lymph nodes are identified within the abdomen.  However, the large  lobulated mass contiguous with the vaginal cuff is hypermetabolic. This demonstrates an SUV max of approximately 24.5.  The mass measures up to 6.8 x 3.6 cm transverse on image 186.  The necrotic nodal mass within the sigmoid the mesocolon is also hypermetabolic. This measures 3.1 cm on image 175 and demonstrates an SUV max of 17.1.  The right external iliac and pelvic sidewall lymph nodes demonstrated on the recent CT are also hypermetabolic.  The largest node measures 2.0 cm on image 176 and has an SUV max of 17.1.  No hypermetabolic left pelvic side wall nodes are identified.  There is no generalized peritoneal hypermetabolic activity or ascites. Scattered activity in the colon is likely physiologic.  The right kidney demonstrates lobularity and scarring.  Skeleton:  No focal hypermetabolic activity to suggest skeletal metastasis.  IMPRESSION:  1.  Multifocal recurrence within the pelvis involving the vaginal cuff, mesenteric and right pelvic sidewall lymph nodes. 2.  No distant metastases identified.   Original Report Authenticated By: Richardean Sale, M.D.   Ct Biopsy  05/14/2013   *RADIOLOGY REPORT*  Clinical history:68 year old with history of endometrial cancer and suspicious lesions in the pelvis.  PROCEDURE(S): CT GUIDED BIOPSY OF PELVIC MASS  Physician: Stephan Minister. Henn, MD  Medications:Versed 2 mg, Fentanyl 100 mcg.  A radiology nurse monitored the patient for moderate sedation.  Moderate sedation time:28 minutes  Procedure:The procedure was explained to the patient.  The risks and benefits of the procedure were discussed and the patient's questions were addressed.  Informed consent was obtained from the patient.  The patient was placed prone on the CT scanner.  Images of the pelvis were obtained.  The left gluteal area was prepped and draped in a sterile fashion.  Skin and soft tissues were anesthetized with  lidocaine.  56 gauge needle was directed into the pelvic mass from a left transgluteal approach.  Needle position confirmed along the posterior aspect of the lesion.  Three core biopsies were obtained with an 18 gauge device.  Samples placed in formalin.  17 gauge needle was removed without complication.  Findings:There is a pelvic mass situated adjacent to the vaginal cuff.  The needle was positioned along the posterior aspect of the lesion.  Complications: None  Impression:CT guided core biopsies of the pelvic mass.   Original Report Authenticated By: Markus Daft, M.D.    ASSESSMENT/PLAN: 68 year old female with  #1 recurrent endometrioid adenocarcinoma of the uterus: Status post palliative chemotherapy consisting of Taxol and carboplatinum x6 cycles. She did have some residual disease. She therefore was begun on  Taxotere and carboplatinum starting 11/27/2013.   #2  Patient is doing well following chemotherapy.  Her labs are stable, I reviewed them with her in detail.     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  She is asymptomatic.    #4 pain controlled with   fentanyl patches as well as oxycodone.   #5 neuropathy: Patient is receiving gabapentin 200 mg 4 times a day.  #6  intermittent vaginal spotting follow up with gynecology as scheduled  #7 hypokalemia: Replaced with po KCl, CMet pending   follow up in 2 weeks for labs, evaluation and cycle 3 of Taxotere/Carboplatin.     All questions were answered. The patient knows to call the clinic with any problems, questions or concerns. We can certainly see the patient much sooner if necessary.  I spent 25 minutes counseling the patient face to  face. The total time spent in the appointment was 30 minutes.  Minette Headland, [place] [phone]

## 2014-01-08 ENCOUNTER — Ambulatory Visit: Payer: Medicare Other | Admitting: Adult Health

## 2014-01-08 ENCOUNTER — Other Ambulatory Visit: Payer: Medicare Other

## 2014-01-08 ENCOUNTER — Ambulatory Visit: Payer: Medicare Other

## 2014-01-09 ENCOUNTER — Other Ambulatory Visit: Payer: Medicare Other

## 2014-01-09 ENCOUNTER — Ambulatory Visit: Payer: Medicare Other

## 2014-01-12 ENCOUNTER — Telehealth: Payer: Self-pay | Admitting: *Deleted

## 2014-01-12 NOTE — Telephone Encounter (Signed)
sw pt made her aware that kk will be on call 01/15/14. gv appt for 01/15/14 to have lbas@ 12noon , ov@ 12:30p, and i emailed MW to adjust her tx...td

## 2014-01-13 ENCOUNTER — Telehealth: Payer: Self-pay | Admitting: *Deleted

## 2014-01-13 NOTE — Telephone Encounter (Signed)
Per staff message I have adjusted 3/5 appt

## 2014-01-15 ENCOUNTER — Ambulatory Visit (HOSPITAL_BASED_OUTPATIENT_CLINIC_OR_DEPARTMENT_OTHER): Payer: Medicare Other | Admitting: Oncology

## 2014-01-15 ENCOUNTER — Ambulatory Visit (HOSPITAL_BASED_OUTPATIENT_CLINIC_OR_DEPARTMENT_OTHER): Payer: Medicare Other

## 2014-01-15 ENCOUNTER — Ambulatory Visit: Payer: Medicare Other | Admitting: Oncology

## 2014-01-15 ENCOUNTER — Other Ambulatory Visit: Payer: Medicare Other

## 2014-01-15 ENCOUNTER — Other Ambulatory Visit (HOSPITAL_BASED_OUTPATIENT_CLINIC_OR_DEPARTMENT_OTHER): Payer: Medicare Other

## 2014-01-15 ENCOUNTER — Encounter: Payer: Self-pay | Admitting: Oncology

## 2014-01-15 VITALS — BP 127/80 | HR 94 | Temp 98.6°F | Resp 18 | Ht 60.0 in | Wt 145.3 lb

## 2014-01-15 DIAGNOSIS — Z5111 Encounter for antineoplastic chemotherapy: Secondary | ICD-10-CM

## 2014-01-15 DIAGNOSIS — C541 Malignant neoplasm of endometrium: Secondary | ICD-10-CM

## 2014-01-15 DIAGNOSIS — C549 Malignant neoplasm of corpus uteri, unspecified: Secondary | ICD-10-CM

## 2014-01-15 DIAGNOSIS — G589 Mononeuropathy, unspecified: Secondary | ICD-10-CM

## 2014-01-15 LAB — CBC WITH DIFFERENTIAL/PLATELET
BASO%: 0.2 % (ref 0.0–2.0)
Basophils Absolute: 0 10*3/uL (ref 0.0–0.1)
EOS%: 0 % (ref 0.0–7.0)
Eosinophils Absolute: 0 10*3/uL (ref 0.0–0.5)
HEMATOCRIT: 30.5 % — AB (ref 34.8–46.6)
HGB: 9.8 g/dL — ABNORMAL LOW (ref 11.6–15.9)
LYMPH%: 5.5 % — AB (ref 14.0–49.7)
MCH: 33.3 pg (ref 25.1–34.0)
MCHC: 32.1 g/dL (ref 31.5–36.0)
MCV: 103.7 fL — ABNORMAL HIGH (ref 79.5–101.0)
MONO#: 0 10*3/uL — AB (ref 0.1–0.9)
MONO%: 0.4 % (ref 0.0–14.0)
NEUT#: 8.9 10*3/uL — ABNORMAL HIGH (ref 1.5–6.5)
NEUT%: 93.9 % — AB (ref 38.4–76.8)
PLATELETS: 262 10*3/uL (ref 145–400)
RBC: 2.94 10*6/uL — AB (ref 3.70–5.45)
RDW: 16.2 % — ABNORMAL HIGH (ref 11.2–14.5)
WBC: 9.5 10*3/uL (ref 3.9–10.3)
lymph#: 0.5 10*3/uL — ABNORMAL LOW (ref 0.9–3.3)
nRBC: 0 % (ref 0–0)

## 2014-01-15 LAB — COMPREHENSIVE METABOLIC PANEL (CC13)
ALBUMIN: 3.8 g/dL (ref 3.5–5.0)
ALT: 12 U/L (ref 0–55)
ANION GAP: 13 meq/L — AB (ref 3–11)
AST: 17 U/L (ref 5–34)
Alkaline Phosphatase: 71 U/L (ref 40–150)
BUN: 12.7 mg/dL (ref 7.0–26.0)
CO2: 24 meq/L (ref 22–29)
CREATININE: 0.8 mg/dL (ref 0.6–1.1)
Calcium: 10.9 mg/dL — ABNORMAL HIGH (ref 8.4–10.4)
Chloride: 106 mEq/L (ref 98–109)
GLUCOSE: 188 mg/dL — AB (ref 70–140)
POTASSIUM: 4.3 meq/L (ref 3.5–5.1)
Sodium: 142 mEq/L (ref 136–145)
Total Bilirubin: 0.47 mg/dL (ref 0.20–1.20)
Total Protein: 7.3 g/dL (ref 6.4–8.3)

## 2014-01-15 MED ORDER — CARBOPLATIN CHEMO INTRADERMAL TEST DOSE 100MCG/0.02ML
100.0000 ug | Freq: Once | INTRADERMAL | Status: AC
  Administered 2014-01-15: 100 ug via INTRADERMAL
  Filled 2014-01-15: qty 0.01

## 2014-01-15 MED ORDER — FENTANYL 50 MCG/HR TD PT72: 50.0000 ug | MEDICATED_PATCH | TRANSDERMAL | Status: DC

## 2014-01-15 MED ORDER — HEPARIN SOD (PORK) LOCK FLUSH 100 UNIT/ML IV SOLN
500.0000 [IU] | Freq: Once | INTRAVENOUS | Status: AC | PRN
  Administered 2014-01-15: 500 [IU]
  Filled 2014-01-15: qty 5

## 2014-01-15 MED ORDER — SODIUM CHLORIDE 0.9 % IV SOLN
350.0000 mg | Freq: Once | INTRAVENOUS | Status: AC
  Administered 2014-01-15: 350 mg via INTRAVENOUS
  Filled 2014-01-15: qty 35

## 2014-01-15 MED ORDER — SODIUM CHLORIDE 0.9 % IV SOLN
Freq: Once | INTRAVENOUS | Status: AC
  Administered 2014-01-15: 14:00:00 via INTRAVENOUS

## 2014-01-15 MED ORDER — SODIUM CHLORIDE 0.9 % IJ SOLN
10.0000 mL | INTRAMUSCULAR | Status: DC | PRN
  Administered 2014-01-15: 10 mL
  Filled 2014-01-15: qty 10

## 2014-01-15 MED ORDER — ONDANSETRON 16 MG/50ML IVPB (CHCC)
16.0000 mg | Freq: Once | INTRAVENOUS | Status: AC
  Administered 2014-01-15: 16 mg via INTRAVENOUS

## 2014-01-15 MED ORDER — SODIUM CHLORIDE 0.9 % IV SOLN
75.0000 mg/m2 | Freq: Once | INTRAVENOUS | Status: AC
  Administered 2014-01-15: 120 mg via INTRAVENOUS
  Filled 2014-01-15: qty 12

## 2014-01-15 MED ORDER — OXYCODONE-ACETAMINOPHEN 5-325 MG PO TABS
1.0000 | ORAL_TABLET | Freq: Once | ORAL | Status: AC
  Administered 2014-01-15: 2 via ORAL

## 2014-01-15 MED ORDER — ONDANSETRON 16 MG/50ML IVPB (CHCC)
INTRAVENOUS | Status: AC
  Filled 2014-01-15: qty 16

## 2014-01-15 MED ORDER — OXYCODONE-ACETAMINOPHEN 5-325 MG PO TABS
ORAL_TABLET | ORAL | Status: AC
  Filled 2014-01-15: qty 2

## 2014-01-15 MED ORDER — DEXAMETHASONE SODIUM PHOSPHATE 20 MG/5ML IJ SOLN
INTRAMUSCULAR | Status: AC
  Filled 2014-01-15: qty 5

## 2014-01-15 MED ORDER — DEXAMETHASONE SODIUM PHOSPHATE 20 MG/5ML IJ SOLN
20.0000 mg | Freq: Once | INTRAMUSCULAR | Status: AC
Start: 2014-01-15 — End: 2014-01-15
  Administered 2014-01-15: 20 mg via INTRAVENOUS

## 2014-01-15 NOTE — Patient Instructions (Signed)
[facility] Discharge Instructions for Patients Receiving Chemotherapy  Today you received the following chemotherapy agents: Taxotere, Carboplatin   To help prevent nausea and vomiting after your treatment, we encourage you to take your nausea medication as prescribed.    If you develop nausea and vomiting that is not controlled by your nausea medication, call the clinic.   BELOW ARE SYMPTOMS THAT SHOULD BE REPORTED IMMEDIATELY:  *FEVER GREATER THAN 100.5 F  *CHILLS WITH OR WITHOUT FEVER  NAUSEA AND VOMITING THAT IS NOT CONTROLLED WITH YOUR NAUSEA MEDICATION  *UNUSUAL SHORTNESS OF BREATH  *UNUSUAL BRUISING OR BLEEDING  TENDERNESS IN MOUTH AND THROAT WITH OR WITHOUT PRESENCE OF ULCERS  *URINARY PROBLEMS  *BOWEL PROBLEMS  UNUSUAL RASH Items with * indicate a potential emergency and should be followed up as soon as possible.  Feel free to call the clinic you have any questions or concerns. The clinic phone number is [phone].    

## 2014-01-16 ENCOUNTER — Ambulatory Visit: Payer: Medicare Other

## 2014-01-16 ENCOUNTER — Telehealth: Payer: Self-pay | Admitting: Oncology

## 2014-01-17 ENCOUNTER — Ambulatory Visit (HOSPITAL_BASED_OUTPATIENT_CLINIC_OR_DEPARTMENT_OTHER): Payer: Medicare Other

## 2014-01-17 VITALS — BP 136/64 | HR 93 | Temp 97.1°F | Resp 18

## 2014-01-17 DIAGNOSIS — C541 Malignant neoplasm of endometrium: Secondary | ICD-10-CM

## 2014-01-17 DIAGNOSIS — Z5189 Encounter for other specified aftercare: Secondary | ICD-10-CM

## 2014-01-17 DIAGNOSIS — C549 Malignant neoplasm of corpus uteri, unspecified: Secondary | ICD-10-CM

## 2014-01-17 MED ORDER — PEGFILGRASTIM INJECTION 6 MG/0.6ML
6.0000 mg | Freq: Once | SUBCUTANEOUS | Status: AC
  Administered 2014-01-17: 6 mg via SUBCUTANEOUS

## 2014-01-18 NOTE — Progress Notes (Signed)
OFFICE PROGRESS NOTE  CC  [name],[name], MD [place] [id number] Dr. Threasa Heads Dr. 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 She had undergone a robotic assisted total laparoscopic hysterectomy bilateral salpingo-oophorectomy right pelvic lymph node biopsy on 12/19/2011 without any complications. 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 There was no involvement of other organs lymphovascular invasion was not identified. 3 lymph nodes were negative for metastatic disease. 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 Patient subsequently continued to be seen by gynecologic oncology. In October 2013 patient had a Pap smear that was normal limits there was noted to be some granulation tissue that was removed from the cough. There is no evidence of disease.  #2patient was seen by Dr. Dahlia Client on 04/29/2013 she had vaginal exam performed that was notable for a nodule in the vagina concerning for recurrence. A rectal examination revealed a firm 8 cm mass also. Because of this she had a biopsy performed. She also had a CT scan of the abdomen and pelvis ordered to further evaluate the sigmoid mass. The pathology came back inconclusive. However the CT of the abdomen and pelvis showed a lobular mass at the vagina cuff measuring 7.0 x  3.2 cm proximally 4 cm craniocaudal dimension. The mass compressed the distal aspect of the sigmoid colon just above the rectum there was no mass approximates the posterior wall of the bladder without clear invasion noted evidence of obstruction of stool proximal to this mass. There was also noted to be a large necrotic lymph node measuring 2.8 cm within the sigmoid mesocolon centrally. There are also small bilateral external iliac lymph nodes. There were no aggressive osseous lesions.   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  #4 patient is s/p palliative chemotherapy starting on 06/03/2013 with Taxol carboplatinum. She received 6 cycles and was re-imaged on 10/14/2013.  There was an interval decrease in tumor burden therefore Dr. [name] recommended 3 additional cycles of Taxotere/Carboplatin (Ms. Morson did develop mild neuropathy with taxol).  She started Taxotere/Carbo on 11/27/13.    #5 pain control: She is being managed with fentanyl patches and oxycodone.  CURRENT THERAPY: Taxotere/carboplatin cycle #3 day 1  INTERVAL HISTORY: Pam Vazquez 68 y.o. female returns for evaluation prior to cycle 3 of taxotere/carboplatin.  She is doing moderately well today.   Her pain continues to be managed with Fentanyl patches but does take tylenol and ibuprofen quite a bit. I hve recommended increase in dose of fentanyl patch to 50 mcg. She has occasional vaginal spotting and is following up with Dr. [name] about this on 02/05/14.  Her numbness is stable and continues to be managed with Neurontin four times a day.  Otherwise, a 10 point ROS is negative.   MEDICAL HISTORY: Past Medical  History  Diagnosis Date  . Diabetes mellitus   . PONV (postoperative nausea and vomiting)   . Cancer     endometrial  . Angina     stress test on chart from 3/12/ none since March 2012  . Chronic kidney disease      kidney stones  . Hypertension     / EKG 1/13 Epic    ALLERGIES:  has No Known Allergies.  MEDICATIONS:  Current Outpatient Prescriptions  Medication Sig Dispense Refill  . amLODipine (NORVASC) 2.5 MG tablet Take 2.5 mg by mouth every morning.       . cyanocobalamin 2000 MCG tablet Take 2,000 mcg by mouth 2 (two) times daily.      [name] dexamethasone (DECADRON) 4 MG tablet Take two tablets (8mg s) two times a day the day before Taxotere and for 3 days after chemotherapy. Don't take the day of Taxotere.  30 tablet  1  . doxycycline (VIBRA-TABS) 100 MG tablet Take 100 mg by mouth every other day.      . gabapentin (NEURONTIN) 100 MG capsule Take 2 capsules (200 mg total) by mouth 3 (three) times daily.  180 capsule  1  . lidocaine-prilocaine (EMLA) cream Apply topically as needed.  30 g  6  . lisinopril-hydrochlorothiazide (PRINZIDE,ZESTORETIC) 20-25 MG per tablet Take 1 tablet by mouth daily after breakfast.       . loratadine (CLARITIN) 10 MG tablet Take 10 mg by mouth daily. Take as needed      . LORazepam (ATIVAN) 0.5 MG tablet Take 1 tablet (0.5 mg total) by mouth every 6 (six) hours as needed (Nausea or vomiting).  30 tablet  0  . metFORMIN (GLUCOPHAGE) 500 MG tablet Take 500 mg by mouth 2 (two) times daily.       . metoprolol tartrate (LOPRESSOR) 25 MG tablet Take 25 mg by mouth 2 (two) times daily.       . Multiple Vitamin (MULTIVITAMIN WITH MINERALS) TABS Take 1 tablet by mouth every morning. She takes Dance movement psychotherapist.      [name] omeprazole (PRILOSEC) 40 MG capsule Take 1 capsule (40 mg total) by mouth daily.  30 capsule  6  . ondansetron (ZOFRAN) 8 MG tablet Take 1 tablet (8 mg total) by mouth 2 (two) times daily. Take two times a day starting the day after chemo for 3 days. Then take two times a day as needed for nausea or vomiting.  30 tablet  1  . prochlorperazine (COMPAZINE) 10 MG tablet Take 1 tablet (10 mg total) by mouth every 6 (six) hours as needed (Nausea or vomiting).  30 tablet  1   . fentaNYL (DURAGESIC - DOSED MCG/HR) 50 MCG/HR Place 1 patch (50 mcg total) onto the skin every 3 (three) days.  10 patch  0  . potassium chloride SA (K-DUR,KLOR-CON) 20 MEQ tablet Take 1 tablet (20 mEq total) by mouth 2 (two) times daily.  20 tablet  0   No current facility-administered medications for this visit.    SURGICAL HISTORY:  Past Surgical History  Procedure Laterality Date  . Hysteroscopy w/d&c  11/21/2011    Procedure: DILATATION AND CURETTAGE /HYSTEROSCOPY;  Surgeon: Gus Height;  Location: [facility];  Service: Gynecology;  Laterality: N/A;  . Tubal ligation    . Breast mass removal  1984, 1992    L breast  . Lithotripsy  2000, 2002  . Back surgery      91 and 2001  . Dilation and curettage of uterus    .  Node dissection  12/19/2011    Procedure: NODE DISSECTION;  Surgeon: [name], MD PHD;  Location: [facility];  Service: Gynecology;  Laterality: N/A;    REVIEW OF SYSTEMS:  A 10 point review of systems was conducted and is otherwise negative except for what is noted above.    PHYSICAL EXAMINATION: Blood pressure 127/80, pulse 94, temperature 98.6 F (37 C), temperature source Oral, resp. rate 18, height 5' (1.524 m), weight 145 lb 4.8 oz (65.908 kg). Body mass index is 28.38 kg/(m^2). GENERAL: Patient is a well appearing female in no acute distress HEENT:  Sclerae anicteric.  Oropharynx clear and moist. No ulcerations or evidence of oropharyngeal candidiasis. Neck is supple.  NODES:  No cervical, supraclavicular, or axillary lymphadenopathy palpated.  BREAST EXAM:  Deferred. LUNGS:  Clear to auscultation bilaterally.  No wheezes or rhonchi. HEART:  Regular rate and rhythm. No murmur appreciated. ABDOMEN:  Soft, nontender.  Positive, normoactive bowel sounds. No organomegaly palpated. MSK:  No focal spinal tenderness to palpation. Full range of motion bilaterally in the upper extremities. EXTREMITIES:  No peripheral edema.   SKIN:  Clear with no obvious rashes or skin  changes. No nail dyscrasia. NEURO:  Nonfocal. Well oriented.  Appropriate affect. ECOG PERFORMANCE STATUS: 0 - Asymptomatic  LABORATORY DATA: Lab Results  Component Value Date   WBC 9.5 01/15/2014   HGB 9.8* 01/15/2014   HCT 30.5* 01/15/2014   MCV 103.7* 01/15/2014   PLT 262 01/15/2014      Chemistry      Component Value Date/Time   NA 142 01/15/2014 1139   NA 141 12/20/2011 0334   K 4.3 01/15/2014 1139   K 4.1 12/20/2011 0334   CL 104 04/29/2013 1229   CL 104 12/20/2011 0334   CO2 24 01/15/2014 1139   CO2 27 12/20/2011 0334   BUN 12.7 01/15/2014 1139   BUN 10 12/20/2011 0334   CREATININE 0.8 01/15/2014 1139   CREATININE 0.80 12/20/2011 0334      Component Value Date/Time   CALCIUM 10.9* 01/15/2014 1139   CALCIUM 10.0 12/18/2013 1015   ALKPHOS 71 01/15/2014 1139   ALKPHOS 69 12/15/2011 1125   AST 17 01/15/2014 1139   AST 22 12/15/2011 1125   ALT 12 01/15/2014 1139   ALT 19 12/15/2011 1125   BILITOT 0.47 01/15/2014 1139   BILITOT 0.8 12/15/2011 1125    ADDITIONAL INFORMATION: The malignant cells are positive for estrogen receptor, progesterone receptor, cytokeratin 7 and focally positive for CDX-2. They are negative for cytokeratin 20. Given the clinical history as well as the histologic features, the findings are consistent with metastatic endometrioid adenocarcinoma. (JBK:kh 05-19-13) Enid Cutter MD Pathologist, Electronic Signature ( Signed 05/19/2013) FINAL DIAGNOSIS Diagnosis Pelvis, biopsy, Central lower - METASTATIC CARCINOMA WITH ASSOCIATED TUMOR NECROSIS. Microscopic Comment Immunostains will be performed and an addendum report will follow. (HCL:kh 05-15-13) Aldona Bar MD Pathologist, Electronic Signature (Case signed 05/15/2013) Specimen Gross and Clinical Information Specimen(s) Obtained: Pelvis, biopsy, Central lower Specimen Clinical Information Recurrent cancer (tl) Gross Received in formalin are fragments and cores of soft white tissue having an aggregate measurement of 1.3 x 0.5 x 0.1 cm.  The specimen is submitted in toto. (GRP: ecj 03/17/7321) 1 of 2 Duplicate copy FINAL for Conran, Barbi P ([id number]) Stain(s) used in Diagnosis: The following stain(s) were used in diagnosing the case: PR - NOACIS, ER - NOACIS, CK-7, CDX-2, CK 20. The control(s) stained appropriately. Disc   RADIOGRAPHIC STUDIES:  Dg Chest 2 View  05/06/2013   *RADIOLOGY REPORT*  Clinical Data: Endometrial cancer  CHEST - 2 VIEW  Comparison: December 15, 2011.  Findings: Cardiomediastinal silhouette appears normal.  No acute pulmonary disease is noted.  Bony thorax is intact.  Stable small dense nodule seen in right lower lobe compared to prior exam most consistent with granuloma.  IMPRESSION: Stable small nodule seen in right lower lobe most consistent with granuloma.  No acute cardiopulmonary abnormality seen.   Original Report Authenticated By: Lupita Raider.,  M.D.   Ct Abdomen Pelvis W Contrast  05/05/2013   *RADIOLOGY REPORT*  Clinical Data: Pelvic pain.  History of endometrial carcinoma.  CT ABDOMEN AND PELVIS WITH CONTRAST  Technique:  Multidetector CT imaging of the abdomen and pelvis was performed following the standard protocol during bolus administration of intravenous contrast.  Contrast: OMNIPAQUE IOHEXOL 300 MG/ML  SOLN  Comparison: CT 01/05/2006  Findings: Lung bases are clear.  No pericardial fluid.  No focal hepatic lesion.  The gallbladder, pancreas, spleen, adrenal glands, kidneys are normal.  There is a cortical scarring of the right kidney.  No hydronephrosis.  The stomach, small bowel, appendix, cecum are normal.  The colon is normal to the level of the descending colon.  There is a lobular mass at the vaginal cuff measuring 7.0 x 3.2 cm in axial dimension and approximately 4 cm craniocaudad dimension. There is a mass compresses the distal aspect of the sigmoid colon just above the rectum (image 62, axial, image 57, coronal series). No mass approximates the posterior wall the bladder  without clear invasion .There is no evidence of obstruction of stool proximal to this mass compressing the sigmoid colon.  There is a large necrotic lymph node measuring 2.8 cm within the sigmoid mesocolon centrally (60).  Patient status post hysterectomy and oophorectomy.  Small bilateral external iliac lymph nodes.  For example 8 mm node on the right and 5 mm node on the left (image 59).  There is no periaortic adenopathy.  No periportal or mesenteric adenopathy  Review of the bone windows demonstrates post lumbar  fusion.  No aggressive osseous lesions.  IMPRESSION:  1.  Lobular mass at the vaginal cuff is consistent with endometrial carcinoma progression.  2.  Mass extends to the serosal surface of the sigmoid colon and compresses the lumen of the sigmoid colon.  No obstruction at this time.  3.  Mass  approximates the posterior wall of the bladder without clear invasion.  4. Large necrotic lymph node within the sigmoid mesocolon superior to the vaginal cuff.  5.  Small bilateral iliac lymph nodes.   Original Report Authenticated By: Genevive Bi, M.D.   Nm Pet Image Restag (ps) Skull Base To Thigh  05/09/2013   *RADIOLOGY REPORT*  Clinical Data: Subsequent treatment strategy for endometrial cancer. Hysterectomy 12/19/2011.  Recurrent pelvic masses on the CT.  NUCLEAR MEDICINE PET SKULL BASE TO THIGH  Fasting Blood Glucose:  141  Technique:  18.3 mCi F-18 FDG was injected intravenously. CT data was obtained and used for attenuation correction and anatomic localization only.  (This was not acquired as a diagnostic CT examination.) Additional exam technical data entered on technologist worksheet.  Comparison:  Abdominal pelvic CT 05/05/2013.  Findings:  Neck: No hypermetabolic lymph nodes in the neck.  Chest:  There are no hypermetabolic mediastinal or hilar lymph nodes.  There is no abnormal metabolic activity within the lungs. CT images demonstrate mild atherosclerosis.  There are no suspicious pulmonary  findings.  Abdomen/Pelvis:  There is no abnormal metabolic activity within the liver, adrenal glands, spleen or pancreas.  No hypermetabolic lymph nodes are identified within the abdomen.  However, the large lobulated mass contiguous with the vaginal cuff is hypermetabolic. This demonstrates an SUV max of approximately 24.5.  The mass measures up to 6.8 x 3.6 cm transverse on image 186.  The necrotic nodal mass within the sigmoid the mesocolon is also hypermetabolic. This measures 3.1 cm on image 175 and demonstrates an SUV max of 17.1.  The right external iliac and pelvic sidewall lymph nodes demonstrated on the recent CT are also hypermetabolic.  The largest node measures 2.0 cm on image 176 and has an SUV max of 17.1.  No hypermetabolic left pelvic side wall nodes are identified.  There is no generalized peritoneal hypermetabolic activity or ascites. Scattered activity in the colon is likely physiologic.  The right kidney demonstrates lobularity and scarring.  Skeleton:  No focal hypermetabolic activity to suggest skeletal metastasis.  IMPRESSION:  1.  Multifocal recurrence within the pelvis involving the vaginal cuff, mesenteric and right pelvic sidewall lymph nodes. 2.  No distant metastases identified.   Original Report Authenticated By: Richardean Sale, M.D.   Ct Biopsy  05/14/2013   *RADIOLOGY REPORT*  Clinical history:67 year old with history of endometrial cancer and suspicious lesions in the pelvis.  PROCEDURE(S): CT GUIDED BIOPSY OF PELVIC MASS  Physician: Stephan Minister. Henn, MD  Medications:Versed 2 mg, Fentanyl 100 mcg.  A radiology nurse monitored the patient for moderate sedation.  Moderate sedation time:28 minutes  Procedure:The procedure was explained to the patient.  The risks and benefits of the procedure were discussed and the patient's questions were addressed.  Informed consent was obtained from the patient.  The patient was placed prone on the CT scanner.  Images of the pelvis were obtained.  The  left gluteal area was prepped and draped in a sterile fashion.  Skin and soft tissues were anesthetized with lidocaine.  54 gauge needle was directed into the pelvic mass from a left transgluteal approach.  Needle position confirmed along the posterior aspect of the lesion.  Three core biopsies were obtained with an 18 gauge device.  Samples placed in formalin.  17 gauge needle was removed without complication.  Findings:There is a pelvic mass situated adjacent to the vaginal cuff.  The needle was positioned along the posterior aspect of the lesion.  Complications: None  Impression:CT guided core biopsies of the pelvic mass.   Original Report Authenticated By: Markus Daft, M.D.    ASSESSMENT/PLAN: 68 year old female with  #1 recurrent endometrioid adenocarcinoma of the uterus: Status post palliative chemotherapy consisting of Taxol and carboplatinum x6 cycles. She did have some residual disease. She therefore was begun on  Taxotere and carboplatinum starting 11/27/2013.   #2  Proceed with cycle 3 of Taxotere and carboplatin. She will need [place] test dose today. Her CBC looks good   #3 Hypercalcemia: resolved  #4 pain controlled with   fentanyl patches as well as oxycodone. We will increase patch to 50 mcg q 72 hours  #5 neuropathy: Patient is receiving gabapentin 200 mg 4 times a day.  #6. Follow up: she will be seen back in 1 week for follow up , labs and chemotoxicity check     All questions were answered. The patient knows to call the clinic with any problems, questions or concerns. We can certainly see the patient much sooner if necessary.  I spent 25 minutes counseling the patient face to face.  The total time spent in the appointment was 30 minutes.  Marcy Panning, MD Medical/Oncology [facility] [phone] (beeper) [phone] (Office)

## 2014-01-20 ENCOUNTER — Telehealth: Payer: Self-pay | Admitting: Oncology

## 2014-01-20 ENCOUNTER — Telehealth: Payer: Self-pay | Admitting: Gynecologic Oncology

## 2014-01-20 NOTE — Telephone Encounter (Signed)
Returned call to patient.  Patient calling stating that she feels she has a hemorrhoid.  "Something is on the outside of my rectum.  I have been constipated for the past several days and probably strained."  She stated she used over the counter medications and has relief from her constipation.  Reporting minimal amount of bleeding when wiping but "nothing substantial."  Advised the next appointment with Dr. [name] would be on March 19 and if there was a hemorrhoid that needed to be removed etc, she would be referred to another provider for treatment or surgical removal of the hemorrhoid.  She stated that she would contact her primary care provider and see if she could be seen sooner.  She is to call the office if an appointment with Dr. [name] needs to be scheduled besides her already scheduled appt for March 26.

## 2014-01-20 NOTE — Telephone Encounter (Signed)
, °

## 2014-01-21 ENCOUNTER — Other Ambulatory Visit: Payer: Self-pay

## 2014-01-21 DIAGNOSIS — C541 Malignant neoplasm of endometrium: Secondary | ICD-10-CM

## 2014-01-22 ENCOUNTER — Other Ambulatory Visit (HOSPITAL_BASED_OUTPATIENT_CLINIC_OR_DEPARTMENT_OTHER): Payer: Medicare Other

## 2014-01-22 ENCOUNTER — Ambulatory Visit (HOSPITAL_BASED_OUTPATIENT_CLINIC_OR_DEPARTMENT_OTHER): Payer: Medicare Other | Admitting: Hematology and Oncology

## 2014-01-22 VITALS — BP 101/68 | HR 86 | Temp 97.8°F | Resp 20 | Ht 60.0 in | Wt 143.5 lb

## 2014-01-22 DIAGNOSIS — D649 Anemia, unspecified: Secondary | ICD-10-CM

## 2014-01-22 DIAGNOSIS — C541 Malignant neoplasm of endometrium: Secondary | ICD-10-CM

## 2014-01-22 DIAGNOSIS — C549 Malignant neoplasm of corpus uteri, unspecified: Secondary | ICD-10-CM

## 2014-01-22 DIAGNOSIS — R531 Weakness: Secondary | ICD-10-CM

## 2014-01-22 DIAGNOSIS — D539 Nutritional anemia, unspecified: Secondary | ICD-10-CM

## 2014-01-22 DIAGNOSIS — G609 Hereditary and idiopathic neuropathy, unspecified: Secondary | ICD-10-CM

## 2014-01-22 DIAGNOSIS — D7589 Other specified diseases of blood and blood-forming organs: Secondary | ICD-10-CM

## 2014-01-22 LAB — CBC WITH DIFFERENTIAL/PLATELET
BASO%: 0.5 % (ref 0.0–2.0)
BASOS ABS: 0 10*3/uL (ref 0.0–0.1)
EOS ABS: 0 10*3/uL (ref 0.0–0.5)
EOS%: 0.4 % (ref 0.0–7.0)
HEMATOCRIT: 29.5 % — AB (ref 34.8–46.6)
HEMOGLOBIN: 9.9 g/dL — AB (ref 11.6–15.9)
LYMPH#: 1.8 10*3/uL (ref 0.9–3.3)
LYMPH%: 22.3 % (ref 14.0–49.7)
MCH: 34 pg (ref 25.1–34.0)
MCHC: 33.7 g/dL (ref 31.5–36.0)
MCV: 101 fL (ref 79.5–101.0)
MONO#: 1.2 10*3/uL — AB (ref 0.1–0.9)
MONO%: 14.9 % — ABNORMAL HIGH (ref 0.0–14.0)
NEUT%: 61.9 % (ref 38.4–76.8)
NEUTROS ABS: 5.1 10*3/uL (ref 1.5–6.5)
Platelets: 201 10*3/uL (ref 145–400)
RBC: 2.92 10*6/uL — ABNORMAL LOW (ref 3.70–5.45)
RDW: 16.9 % — ABNORMAL HIGH (ref 11.2–14.5)
WBC: 8.3 10*3/uL (ref 3.9–10.3)

## 2014-01-22 LAB — COMPREHENSIVE METABOLIC PANEL (CC13)
ALBUMIN: 3.7 g/dL (ref 3.5–5.0)
ALK PHOS: 75 U/L (ref 40–150)
ALT: 14 U/L (ref 0–55)
AST: 15 U/L (ref 5–34)
Anion Gap: 13 mEq/L — ABNORMAL HIGH (ref 3–11)
BILIRUBIN TOTAL: 0.42 mg/dL (ref 0.20–1.20)
BUN: 25 mg/dL (ref 7.0–26.0)
CO2: 28 mEq/L (ref 22–29)
Calcium: 10.5 mg/dL — ABNORMAL HIGH (ref 8.4–10.4)
Chloride: 100 mEq/L (ref 98–109)
Creatinine: 1 mg/dL (ref 0.6–1.1)
Glucose: 134 mg/dl (ref 70–140)
POTASSIUM: 3.5 meq/L (ref 3.5–5.1)
Sodium: 141 mEq/L (ref 136–145)
Total Protein: 6.6 g/dL (ref 6.4–8.3)

## 2014-01-23 ENCOUNTER — Other Ambulatory Visit: Payer: Medicare Other

## 2014-01-28 ENCOUNTER — Other Ambulatory Visit: Payer: Self-pay

## 2014-01-28 ENCOUNTER — Telehealth: Payer: Self-pay

## 2014-01-28 NOTE — Telephone Encounter (Signed)
Patient was unsure if she was supposed to have a refill on potassium. Per Dr Owens Loffler patient is to eat foods high in potassium and come in for a recheck of her blood this Friday 01/30/14. Patient verbalized understanding.  Will call back with any other questions.

## 2014-01-30 ENCOUNTER — Other Ambulatory Visit: Payer: Self-pay | Admitting: *Deleted

## 2014-01-30 ENCOUNTER — Other Ambulatory Visit (HOSPITAL_BASED_OUTPATIENT_CLINIC_OR_DEPARTMENT_OTHER): Payer: Medicare Other

## 2014-01-30 ENCOUNTER — Other Ambulatory Visit: Payer: Self-pay

## 2014-01-30 DIAGNOSIS — C549 Malignant neoplasm of corpus uteri, unspecified: Secondary | ICD-10-CM

## 2014-01-30 DIAGNOSIS — D539 Nutritional anemia, unspecified: Secondary | ICD-10-CM

## 2014-01-30 DIAGNOSIS — C541 Malignant neoplasm of endometrium: Secondary | ICD-10-CM

## 2014-01-30 DIAGNOSIS — D649 Anemia, unspecified: Secondary | ICD-10-CM

## 2014-01-30 DIAGNOSIS — R531 Weakness: Secondary | ICD-10-CM

## 2014-01-30 LAB — CBC WITH DIFFERENTIAL/PLATELET
BASO%: 0.4 % (ref 0.0–2.0)
BASOS ABS: 0.1 10*3/uL (ref 0.0–0.1)
EOS%: 0.1 % (ref 0.0–7.0)
Eosinophils Absolute: 0 10*3/uL (ref 0.0–0.5)
HCT: 29.3 % — ABNORMAL LOW (ref 34.8–46.6)
HEMOGLOBIN: 9.5 g/dL — AB (ref 11.6–15.9)
LYMPH%: 14.7 % (ref 14.0–49.7)
MCH: 33.1 pg (ref 25.1–34.0)
MCHC: 32.4 g/dL (ref 31.5–36.0)
MCV: 102.3 fL — AB (ref 79.5–101.0)
MONO#: 0.6 10*3/uL (ref 0.1–0.9)
MONO%: 4.3 % (ref 0.0–14.0)
NEUT#: 11.9 10*3/uL — ABNORMAL HIGH (ref 1.5–6.5)
NEUT%: 80.5 % — ABNORMAL HIGH (ref 38.4–76.8)
Platelets: 146 10*3/uL (ref 145–400)
RBC: 2.86 10*6/uL — AB (ref 3.70–5.45)
RDW: 17.6 % — AB (ref 11.2–14.5)
WBC: 14.7 10*3/uL — ABNORMAL HIGH (ref 3.9–10.3)
lymph#: 2.2 10*3/uL (ref 0.9–3.3)

## 2014-01-30 LAB — MAGNESIUM (CC13): Magnesium: 1.4 mg/dl — CL (ref 1.5–2.5)

## 2014-01-30 MED ORDER — MAGNESIUM OXIDE 400 (241.3 MG) MG PO TABS: 400.0000 mg | ORAL_TABLET | Freq: Two times a day (BID) | ORAL | Status: DC

## 2014-01-30 NOTE — Progress Notes (Unsigned)
Patient was in waiting room, explained to her that her Magesium was low at 1.4 mg/dl and Dr.Faidas would like her to start Magnesium Oxide. Explained to patient that the prescription was sent in too her pharmacy. Pharmacy verified with patient. Patient verbalized understanding and denies any questions or concerns at this time. Informed patient to call with any further questions.

## 2014-01-31 LAB — VITAMIN D 25 HYDROXY (VIT D DEFICIENCY, FRACTURES): VIT D 25 HYDROXY: 57 ng/mL (ref 30–89)

## 2014-01-31 LAB — FOLATE: Folate: >20 ng/mL

## 2014-01-31 LAB — VITAMIN B12

## 2014-02-02 ENCOUNTER — Encounter: Payer: Self-pay | Admitting: Hematology and Oncology

## 2014-02-02 NOTE — Progress Notes (Signed)
[name]  OFFICE PROGRESS NOTE  CC  [name],[name], MD [name] [address] Dr. Threasa Heads Dr. 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 She had undergone a robotic assisted total laparoscopic hysterectomy bilateral salpingo-oophorectomy right pelvic lymph node biopsy on 12/19/2011 without any complications. 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 There was no involvement of other organs lymphovascular invasion was not identified. 3 lymph nodes were negative for metastatic disease. 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 Patient subsequently continued to be seen by gynecologic oncology. In October 2013 patient had a Pap smear that was normal limits there was noted to be some granulation tissue that was removed from the cough. There is no evidence of disease.  #2patient was seen by Dr. Dahlia Client on 04/29/2013 she had vaginal exam performed that was notable for a nodule in the vagina concerning for recurrence. A rectal examination revealed a firm 8 cm mass also. Because of this she had a biopsy performed. She also had a CT scan of the abdomen and pelvis ordered to further evaluate the sigmoid mass. The pathology came back inconclusive. However the CT of the abdomen and pelvis showed a lobular mass at the vagina cuff measuring 7.0  x 3.2 cm proximally 4 cm craniocaudal dimension. The mass compressed the distal aspect of the sigmoid colon just above the rectum there was no mass approximates the posterior wall of the bladder without clear invasion noted evidence of obstruction of stool proximal to this mass. There was also noted to be a large necrotic lymph node measuring 2.8 cm within the sigmoid mesocolon centrally. There are also small bilateral external iliac lymph nodes. There were no aggressive osseous lesions.   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  #4 patient is s/p palliative chemotherapy starting on 06/03/2013 with Taxol carboplatinum. She received 6 cycles and was re-imaged on 10/14/2013.  There was an interval decrease in tumor burden therefore Dr. [name] recommended 3 additional cycles of Taxotere/Carboplatin (Ms. Azbill did develop mild neuropathy with taxol).  She started Taxotere/Carbo on 11/27/13.    #5 pain control: She is being managed with fentanyl patches and oxycodone.  CURRENT THERAPY: Taxotere/carboplatin cycle #3 day 1  INTERVAL HISTORY: Pam Vazquez 68 y.o. female returns for evaluation  after she received  cycle 3 of taxotere/carboplatin on the 01/15/2014.  She is doing moderately well today.   Her pain continues to be managed with Fentanyl patches but does take tylenol and ibuprofen  She has occasional vaginal spotting and is following up with Dr. [name] about this on 02/05/14.  Her numbness is managed with Neurontin 200 mg 6 times a day makes her very sleepy. She reports very low energy.She notes frequency and conctipation.   Otherwise, a 10 point ROS is negative.  MEDICAL HISTORY: Past Medical History  Diagnosis Date  . Diabetes mellitus   . PONV (postoperative nausea and vomiting)   . Cancer     endometrial  . Angina     stress test on chart from 3/12/ none since March 2012  . Chronic  kidney disease     kidney stones  . Hypertension     / EKG 1/13 Epic    ALLERGIES:  has No Known Allergies.  MEDICATIONS:  Current Outpatient Prescriptions  Medication Sig Dispense Refill  . amLODipine (NORVASC) 2.5 MG tablet Take 2.5 mg by mouth every morning.       . cyanocobalamin 2000 MCG tablet Take 2,000 mcg by mouth 2 (two) times daily.      [name] dexamethasone (DECADRON) 4 MG tablet Take two tablets (8mg s) two times a day the day before Taxotere and for 3 days after chemotherapy. Don't take the day of Taxotere.  30 tablet  1  . doxycycline (VIBRA-TABS) 100 MG tablet Take 100 mg by mouth every other day.      . fentaNYL (DURAGESIC - DOSED MCG/HR) 50 MCG/HR Place 1 patch (50 mcg total) onto the skin every 3 (three) days.  10 patch  0  . gabapentin (NEURONTIN) 100 MG capsule Take 2 capsules (200 mg total) by mouth 3 (three) times daily.  180 capsule  1  . lidocaine-prilocaine (EMLA) cream Apply topically as needed.  30 g  6  . lisinopril-hydrochlorothiazide (PRINZIDE,ZESTORETIC) 20-25 MG per tablet Take 1 tablet by mouth daily after breakfast.       . loratadine (CLARITIN) 10 MG tablet Take 10 mg by mouth daily. Take as needed      . LORazepam (ATIVAN) 0.5 MG tablet Take 1 tablet (0.5 mg total) by mouth every 6 (six) hours as needed (Nausea or vomiting).  30 tablet  0  . metFORMIN (GLUCOPHAGE) 500 MG tablet Take 500 mg by mouth 2 (two) times daily.       . metoprolol tartrate (LOPRESSOR) 25 MG tablet Take 25 mg by mouth 2 (two) times daily.       . Multiple Vitamin (MULTIVITAMIN WITH MINERALS) TABS Take 1 tablet by mouth every morning. She takes Dance movement psychotherapist.      [name] omeprazole (PRILOSEC) 40 MG capsule Take 1 capsule (40 mg total) by mouth daily.  30 capsule  6  . ondansetron (ZOFRAN) 8 MG tablet Take 1 tablet (8 mg total) by mouth 2 (two) times daily. Take two times a day starting the day after chemo for 3 days. Then take two times a day as needed for nausea or vomiting.  30 tablet  1  .  magnesium oxide (MAG-OX) 400 (241.3 MG) MG tablet Take 1 tablet (400 mg total) by mouth 2 (two) times daily.  60 tablet  0  . potassium chloride SA (K-DUR,KLOR-CON) 20 MEQ tablet Take 1 tablet (20 mEq total) by mouth 2 (two) times daily.  20 tablet  0  . prochlorperazine (COMPAZINE) 10 MG tablet Take 1 tablet (10 mg total) by mouth every 6 (six) hours as needed (Nausea or vomiting).  30 tablet  1   No current facility-administered medications for this visit.    SURGICAL HISTORY:  Past Surgical History  Procedure Laterality Date  . Hysteroscopy w/d&c  11/21/2011    Procedure: DILATATION AND CURETTAGE /HYSTEROSCOPY;  Surgeon: Gus Height;  Location: [facility];  Service: Gynecology;  Laterality: N/A;  . Tubal ligation    . Breast mass removal  1984, 1992  L breast  . Lithotripsy  2000, 2002  . Back surgery      91 and 2001  . Dilation and curettage of uterus    . Node dissection  12/19/2011    Procedure: NODE DISSECTION;  Surgeon: [name], MD PHD;  Location: [facility];  Service: Gynecology;  Laterality: N/A;    REVIEW OF SYSTEMS:  A 10 point review of systems was conducted and is otherwise negative except for what is noted above.    PHYSICAL EXAMINATION: Blood pressure 101/68, pulse 86, temperature 97.8 F (36.6 C), temperature source Oral, resp. rate 20, height 5' (1.524 m), weight 143 lb 8 oz (65.091 kg). Body mass index is 28.03 kg/(m^2). GENERAL: Patient is a well appearing female in no acute distress HEENT:  Sclerae anicteric.  Oropharynx clear and moist. No mucositis. Neck is supple.  NODES:  No cervical, supraclavicular, or axillary lymphadenopathy BREAST EXAM:  Deferred. LUNGS:  Clear to auscultation bilaterally.  No wheezes or rhonchi. HEART:  Regular rate and rhythm. No murmur ABDOMEN:  Soft, nontender.  Positive, normoactive bowel sounds. No organomegaly MSK:  No focal spinal tenderness to palpation. Full range of motion bilaterally in the upper extremities. EXTREMITIES:  No  peripheral edema.   SKIN:  Clear with no obvious rashes or skin changes. No nail dyscrasia. NEURO:  Nonfocal. Well oriented.  Appropriate affect. ECOG PERFORMANCE STATUS: 0 - Asymptomatic  LABORATORY DATA: Lab Results  Component Value Date   WBC 14.7* 01/30/2014   HGB 9.5* 01/30/2014   HCT 29.3* 01/30/2014   MCV 102.3* 01/30/2014   PLT 146 01/30/2014      Chemistry      Component Value Date/Time   NA 141 01/22/2014 1049   NA 141 12/20/2011 0334   K 3.5 01/22/2014 1049   K 4.1 12/20/2011 0334   CL 104 04/29/2013 1229   CL 104 12/20/2011 0334   CO2 28 01/22/2014 1049   CO2 27 12/20/2011 0334   BUN 25.0 01/22/2014 1049   BUN 10 12/20/2011 0334   CREATININE 1.0 01/22/2014 1049   CREATININE 0.80 12/20/2011 0334      Component Value Date/Time   CALCIUM 10.5* 01/22/2014 1049   CALCIUM 10.0 12/18/2013 1015   ALKPHOS 75 01/22/2014 1049   ALKPHOS 69 12/15/2011 1125   AST 15 01/22/2014 1049   AST 22 12/15/2011 1125   ALT 14 01/22/2014 1049   ALT 19 12/15/2011 1125   BILITOT 0.42 01/22/2014 1049   BILITOT 0.8 12/15/2011 1125    ADDITIONAL INFORMATION: The malignant cells are positive for estrogen receptor, progesterone receptor, cytokeratin 7 and focally positive for CDX-2. They are negative for cytokeratin 20. Given the clinical history as well as the histologic features, the findings are consistent with metastatic endometrioid adenocarcinoma. (JBK:kh 05-19-13) Enid Cutter MD Pathologist, Electronic Signature ( Signed 05/19/2013) FINAL DIAGNOSIS Diagnosis Pelvis, biopsy, Central lower - METASTATIC CARCINOMA WITH ASSOCIATED TUMOR NECROSIS. Microscopic Comment Immunostains will be performed and an addendum report will follow. (HCL:kh 05-15-13) Aldona Bar MD Pathologist, Electronic Signature (Case signed 05/15/2013) Specimen Gross and Clinical Information Specimen(s) Obtained: Pelvis, biopsy, Central lower Specimen Clinical Information Recurrent cancer (tl) Gross Received in formalin are fragments and  cores of soft white tissue having an aggregate measurement of 1.3 x 0.5 x 0.1 cm. The specimen is submitted in toto. (GRP: [id number] [id number]) 1 of 2 Duplicate copy FINAL for Godwin, Larhonda P [id number]) Stain(s) used in Diagnosis: The following stain(s) were used in diagnosing the case: PR - NOACIS,  ER - NOACIS, CK-7, CDX-2, CK 20. The control(s) stained appropriately. Disc   RADIOGRAPHIC STUDIES:  Dg Chest 2 View  05/06/2013   *RADIOLOGY REPORT*  Clinical Data: Endometrial cancer  CHEST - 2 VIEW  Comparison: December 15, 2011.  Findings: Cardiomediastinal silhouette appears normal.  No acute pulmonary disease is noted.  Bony thorax is intact.  Stable small dense nodule seen in right lower lobe compared to prior exam most consistent with granuloma.  IMPRESSION: Stable small nodule seen in right lower lobe most consistent with granuloma.  No acute cardiopulmonary abnormality seen.   Original Report Authenticated By: Marijo Conception.,  M.D.   Ct Abdomen Pelvis W Contrast  05/05/2013   *RADIOLOGY REPORT*  Clinical Data: Pelvic pain.  History of endometrial carcinoma.  CT ABDOMEN AND PELVIS WITH CONTRAST  Technique:  Multidetector CT imaging of the abdomen and pelvis was performed following the standard protocol during bolus administration of intravenous contrast.  Contrast: 174mL OMNIPAQUE IOHEXOL 300 MG/ML  SOLN  Comparison: CT 01/05/2006  Findings: Lung bases are clear.  No pericardial fluid.  No focal hepatic lesion.  The gallbladder, pancreas, spleen, adrenal glands, kidneys are normal.  There is a cortical scarring of the right kidney.  No hydronephrosis.  The stomach, small bowel, appendix, cecum are normal.  The colon is normal to the level of the descending colon.  There is a lobular mass at the vaginal cuff measuring 7.0 x 3.2 cm in axial dimension and approximately 4 cm craniocaudad dimension. There is a mass compresses the distal aspect of the sigmoid colon just above the rectum (image 62, axial,  image 57, coronal series). No mass approximates the posterior wall the bladder without clear invasion .There is no evidence of obstruction of stool proximal to this mass compressing the sigmoid colon.  There is a large necrotic lymph node measuring 2.8 cm within the sigmoid mesocolon centrally (60).  Patient status post hysterectomy and oophorectomy.  Small bilateral external iliac lymph nodes.  For example 8 mm node on the right and 5 mm node on the left (image 59).  There is no periaortic adenopathy.  No periportal or mesenteric adenopathy  Review of the bone windows demonstrates post lumbar  fusion.  No aggressive osseous lesions.  IMPRESSION:  1.  Lobular mass at the vaginal cuff is consistent with endometrial carcinoma progression.  2.  Mass extends to the serosal surface of the sigmoid colon and compresses the lumen of the sigmoid colon.  No obstruction at this time.  3.  Mass  approximates the posterior wall of the bladder without clear invasion.  4. Large necrotic lymph node within the sigmoid mesocolon superior to the vaginal cuff.  5.  Small bilateral iliac lymph nodes.   Original Report Authenticated By: Suzy Bouchard, M.D.   Nm Pet Image Restag (ps) Skull Base To Thigh  05/09/2013   *RADIOLOGY REPORT*  Clinical Data: Subsequent treatment strategy for endometrial cancer. Hysterectomy 12/19/2011.  Recurrent pelvic masses on the CT.  NUCLEAR MEDICINE PET SKULL BASE TO THIGH  Fasting Blood Glucose:  141  Technique:  18.3 mCi F-18 FDG was injected intravenously. CT data was obtained and used for attenuation correction and anatomic localization only.  (This was not acquired as a diagnostic CT examination.) Additional exam technical data entered on technologist worksheet.  Comparison:  Abdominal pelvic CT 05/05/2013.  Findings:  Neck: No hypermetabolic lymph nodes in the neck.  Chest:  There are no hypermetabolic mediastinal or hilar lymph nodes.  There is  no abnormal metabolic activity within the lungs.  CT images demonstrate mild atherosclerosis.  There are no suspicious pulmonary findings.  Abdomen/Pelvis:  There is no abnormal metabolic activity within the liver, adrenal glands, spleen or pancreas.  No hypermetabolic lymph nodes are identified within the abdomen.  However, the large lobulated mass contiguous with the vaginal cuff is hypermetabolic. This demonstrates an SUV max of approximately 24.5.  The mass measures up to 6.8 x 3.6 cm transverse on image 186.  The necrotic nodal mass within the sigmoid the mesocolon is also hypermetabolic. This measures 3.1 cm on image 175 and demonstrates an SUV max of 17.1.  The right external iliac and pelvic sidewall lymph nodes demonstrated on the recent CT are also hypermetabolic.  The largest node measures 2.0 cm on image 176 and has an SUV max of 17.1.  No hypermetabolic left pelvic side wall nodes are identified.  There is no generalized peritoneal hypermetabolic activity or ascites. Scattered activity in the colon is likely physiologic.  The right kidney demonstrates lobularity and scarring.  Skeleton:  No focal hypermetabolic activity to suggest skeletal metastasis.  IMPRESSION:  1.  Multifocal recurrence within the pelvis involving the vaginal cuff, mesenteric and right pelvic sidewall lymph nodes. 2.  No distant metastases identified.   Original Report Authenticated By: Richardean Sale, M.D.   Ct Biopsy  05/14/2013   *RADIOLOGY REPORT*  Clinical history:68 year old with history of endometrial cancer and suspicious lesions in the pelvis.  PROCEDURE(S): CT GUIDED BIOPSY OF PELVIC MASS  Physician: Stephan Minister. Henn, MD  Medications:Versed 2 mg, Fentanyl 100 mcg.  A radiology nurse monitored the patient for moderate sedation.  Moderate sedation time:28 minutes  Procedure:The procedure was explained to the patient.  The risks and benefits of the procedure were discussed and the patient's questions were addressed.  Informed consent was obtained from the patient.  The patient  was placed prone on the CT scanner.  Images of the pelvis were obtained.  The left gluteal area was prepped and draped in a sterile fashion.  Skin and soft tissues were anesthetized with lidocaine.  30 gauge needle was directed into the pelvic mass from a left transgluteal approach.  Needle position confirmed along the posterior aspect of the lesion.  Three core biopsies were obtained with an 18 gauge device.  Samples placed in formalin.  17 gauge needle was removed without complication.  Findings:There is a pelvic mass situated adjacent to the vaginal cuff.  The needle was positioned along the posterior aspect of the lesion.  Complications: None  Impression:CT guided core biopsies of the pelvic mass.   Original Report Authenticated By: Markus Daft, M.D.    ASSESSMENT/PLAN: 68 year old female with  #1 recurrent endometrioid adenocarcinoma of the uterus: Status post palliative chemotherapy consisting of Taxol and carboplatinum x6 cycles. She did have some residual disease. She therefore was begun on  Taxotere and carboplatinum starting 11/27/2013.   #2   Completed cycle 3 of Taxotere and carboplatin.  Her CBC looks good WBC 8.3 HCT 29.5 Plat  201 stable  #3Hypercalcemia stable monitor,frequency and constipation most likely hypercalcemia contributing,check Vit D  :#4 pain controlled with   fentanyl patches as well as oxycodone.  #5 neuropathy: Patient is receiving gabapentin 200 mg 6 times a day continue  will check Mg level  #6. Follow up: she will be seen back in 2 weeks for follow up , labs and chemotherapy.Dr.Brewster follow up  As well.  #7 anemia with  Macrocytosis most likely due to  chemotherapy will check vit B12 and folate    All questions were answered. The patient knows to call the clinic with any problems, questions or concerns. We can certainly see the patient much sooner if necessary.  I spent 25 minutes counseling the patient face to face. The total time spent in the appointment was  30 minutes.  Amada Kingfisher, M.D. Oncology/Hematology [place] [phone] (Office)

## 2014-02-04 ENCOUNTER — Telehealth: Payer: Self-pay | Admitting: Oncology

## 2014-02-04 ENCOUNTER — Telehealth: Payer: Self-pay | Admitting: Gynecologic Oncology

## 2014-02-04 ENCOUNTER — Other Ambulatory Visit: Payer: Self-pay | Admitting: *Deleted

## 2014-02-04 DIAGNOSIS — C541 Malignant neoplasm of endometrium: Secondary | ICD-10-CM

## 2014-02-04 NOTE — Telephone Encounter (Signed)
returned pt call and s.w. pt and confirmed pt appt...Marland KitchenMarland Kitchen

## 2014-02-04 NOTE — Telephone Encounter (Signed)
Office Visit:  GYN ONCOLOGY   Chief Complaint:  Recurrent endometrial cancer  Assessment:   1) 68 y.o.  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  Her postop course has been complicated by persistent granulation tissue.   04/2013 vaginal examination is notable for a nodule in the vagina this was biopsied. A rectal examination firm 8 cm mass was noted.  Biopsy c/w recurrent endometroid cancer.  Ms Sandles has received 6 cycles taxol/carbo.  Repeat CT 4 weeks after cycle 6 notabkle for interval decrease in tumor burden  She received 3 additional cycles of Taxol/Carbo completed 01/2014.    HPI:  Pam Vazquez is a 69 y.o. year old No obstetric history on file. initially seen in consultation on 11/27/2011 at the request of Dr. Melinda Crutch for evaluation of a grade 1 endometrial adenocarcinoma..  She then underwent a robotic-assisted total laparoscopic hysterectomy bilateral salpingo-oophorectomy right pelvic lymph node biopsy on 83/38/2505 without complications.  Her final pathologic diagnosis is a Stage Ia Grade 1 endometrioid endometrial cancer with no evidence of lymphovascular space invasion, 5/16 mm (31 %) of myometrial invasion and negative lymph nodes.  Pam Vazquez   presented to Dr. Delene Loll in October of 2013. A Pap test at that time was within normal limits and granulation tissue was removed from the cuff.     Subsequent visits to Dr. Gertie Fey in November December and January were all notable for the application of silver nitrate for management of the persistent vaginal cuff granulation tissue.  At a visit 04/29/2013 granulation tissue was noted and a pelic mass was appreciated and biopsied.   Diagnosis Vagina, biopsy, apex - SLIGHTLY INFLAMED SQUAMOUS EPITHELIUM. - NO EVIDENCE OF DYSPLASIA OR MALIGNANCY.  CT  05/05/2013 1. Lobular mass at the vaginal cuff is consistent with endometrial carcinoma progression. 2. Mass extends to the serosal surface  of the sigmoid colon and compresses the lumen of the sigmoid colon. No obstruction at this time.  3. Mass approximates the posterior wall of the bladder without clear invasion. 4. Large necrotic lymph node within the sigmoid mesocolon superior to the vaginal cuff. 5. Small bilateral iliac lymph nodes  CT guided bx 05/2013 Pelvis, biopsy, Central lower - METASTATIC CARCINOMA WITH ASSOCIATED TUMOR NECROSIS.  CT scan after 6 cycles showed tumor reduction.  Pam Vazquez has received 9 cycles taxol/Carboplatin completed 01/2014.   Past Medical History  Diagnosis Date  . Diabetes mellitus   . PONV (postoperative nausea and vomiting)   . Cancer     endometrial  . Angina     stress test on chart from 3/12/ none since March 2012  . Chronic kidney disease     kidney stones  . Hypertension     / EKG 1/13 Epic   Past Surgical History  Procedure Laterality Date  . Hysteroscopy w/d&c  11/21/2011    Procedure: DILATATION AND CURETTAGE /HYSTEROSCOPY;  Surgeon: Gus Height;  Location: [facility];  Service: Gynecology;  Laterality: N/A;  . Tubal ligation    . Breast mass removal  1984, 1992    L breast  . Lithotripsy  2000, 2002  . Back surgery      91 and 2001  . Dilation and curettage of uterus    . Node dissection  12/19/2011    Procedure: NODE DISSECTION;  Surgeon: [name], MD PHD;  Location: [facility];  Service: Gynecology;  Laterality: N/A;   Social Hx.    her husband died  earlier this year  Review of systems: Constitutional:  She has no weight gain or weight loss. She has no fever or chills. Eyes: No blurred vision Ears, Nose, Mouth, Throat: No dizziness, headaches or changes in hearing.  Cardiovascular: No chest pain, palpitations or edema. Respiratory:  No shortness of breath, wheezing or cough Gastrointestinal: She has normal bowel movements without diarrhea or constipation. She denies any nausea or vomiting. She denies blood in her stool or heart burn.  Genitourinary:  She denies  pelvic pain, pelvic pressure or changes in her urinary function. She has no hematuria, dysuria, or incontinence. She has no vaginal bleeding,  Musculoskeletal: Denies muscle weakness or joint pains.  Skin:  She has no skin changes, rashes or itching Neurological:  Denies dizziness or headaches. No neuropathy, no numbness or tingling. Hematologic/Lymphatic:   No easy bruising or bleeding   Physical Exam: BP 105/61  Pulse 74  Temp(Src) 98.7 F (37.1 C) (Oral)  Resp 16  Ht 5' (1.524 m)  Wt 151 lb 11.2 oz (68.811 kg)  BMI 29.63 kg/m2 General: Well dressed, well nourished in no apparent distress.   HEENT:  Normocephalic and atraumatic, no lesions.  Extraocular muscles intact. Sclerae anicteric Skin:  No lesions or rashes. Lungs:  Clear to auscultation bilaterally.  No wheezes. Cardiovascular:  Regular rate and rhythm.  No murmurs or rubs. Abdomen:  Soft, nontender, nondistended.  No palpable masses.  No hepatosplenomegaly.  No ascites. Normal bowel sounds.  No hernias or tenderness at the laparoscopic trocar sites. Genitourinary: Normal EGBUS  Vaginal cuff intact.  No bleeding or discharge.   firmness at the cuff.  No gross disease Rectal: Good tone, no masses.anterior rectal nodularity Extremities: No cyanosis, clubbing or edema.  No calf tenderness or erythema. No palpable cords. Lymphatics:  No palpable supraclavicular, cervical, axillary or inguinal lymphadenopathy. Psychiatric: Mood and affect are appropriate. Neurological: Awake, alert and oriented x 3. Sensation is intact,  Musculoskeletal: No pain, normal strength and range of motion.

## 2014-02-05 ENCOUNTER — Other Ambulatory Visit (HOSPITAL_BASED_OUTPATIENT_CLINIC_OR_DEPARTMENT_OTHER): Payer: Medicare Other

## 2014-02-05 ENCOUNTER — Ambulatory Visit (HOSPITAL_BASED_OUTPATIENT_CLINIC_OR_DEPARTMENT_OTHER): Payer: Medicare Other | Admitting: Adult Health

## 2014-02-05 ENCOUNTER — Ambulatory Visit: Payer: Medicare Other | Attending: Gynecologic Oncology | Admitting: Gynecologic Oncology

## 2014-02-05 ENCOUNTER — Encounter: Payer: Self-pay | Admitting: Adult Health

## 2014-02-05 ENCOUNTER — Encounter: Payer: Self-pay | Admitting: Gynecologic Oncology

## 2014-02-05 ENCOUNTER — Ambulatory Visit: Payer: Medicare Other

## 2014-02-05 ENCOUNTER — Other Ambulatory Visit: Payer: Medicare Other

## 2014-02-05 VITALS — BP 122/72 | HR 93 | Temp 98.2°F | Resp 18 | Ht 60.0 in | Wt 142.1 lb

## 2014-02-05 DIAGNOSIS — K625 Hemorrhage of anus and rectum: Secondary | ICD-10-CM

## 2014-02-05 DIAGNOSIS — Z9221 Personal history of antineoplastic chemotherapy: Secondary | ICD-10-CM | POA: Insufficient documentation

## 2014-02-05 DIAGNOSIS — C549 Malignant neoplasm of corpus uteri, unspecified: Secondary | ICD-10-CM

## 2014-02-05 DIAGNOSIS — M549 Dorsalgia, unspecified: Secondary | ICD-10-CM

## 2014-02-05 DIAGNOSIS — N898 Other specified noninflammatory disorders of vagina: Secondary | ICD-10-CM

## 2014-02-05 DIAGNOSIS — Z9079 Acquired absence of other genital organ(s): Secondary | ICD-10-CM | POA: Insufficient documentation

## 2014-02-05 DIAGNOSIS — R198 Other specified symptoms and signs involving the digestive system and abdomen: Secondary | ICD-10-CM | POA: Insufficient documentation

## 2014-02-05 DIAGNOSIS — I10 Essential (primary) hypertension: Secondary | ICD-10-CM | POA: Insufficient documentation

## 2014-02-05 DIAGNOSIS — E119 Type 2 diabetes mellitus without complications: Secondary | ICD-10-CM | POA: Insufficient documentation

## 2014-02-05 DIAGNOSIS — C541 Malignant neoplasm of endometrium: Secondary | ICD-10-CM

## 2014-02-05 DIAGNOSIS — C7982 Secondary malignant neoplasm of genital organs: Secondary | ICD-10-CM

## 2014-02-05 DIAGNOSIS — Z90710 Acquired absence of both cervix and uterus: Secondary | ICD-10-CM | POA: Insufficient documentation

## 2014-02-05 LAB — CBC WITH DIFFERENTIAL/PLATELET
BASO%: 0.1 % (ref 0.0–2.0)
Basophils Absolute: 0 10*3/uL (ref 0.0–0.1)
EOS ABS: 0 10*3/uL (ref 0.0–0.5)
EOS%: 0 % (ref 0.0–7.0)
HCT: 27.9 % — ABNORMAL LOW (ref 34.8–46.6)
HGB: 9.2 g/dL — ABNORMAL LOW (ref 11.6–15.9)
LYMPH%: 7.9 % — ABNORMAL LOW (ref 14.0–49.7)
MCH: 33.8 pg (ref 25.1–34.0)
MCHC: 32.8 g/dL (ref 31.5–36.0)
MCV: 102.8 fL — ABNORMAL HIGH (ref 79.5–101.0)
MONO#: 0.2 10*3/uL (ref 0.1–0.9)
MONO%: 3.4 % (ref 0.0–14.0)
NEUT%: 88.6 % — ABNORMAL HIGH (ref 38.4–76.8)
NEUTROS ABS: 6.3 10*3/uL (ref 1.5–6.5)
Platelets: 115 10*3/uL — ABNORMAL LOW (ref 145–400)
RBC: 2.72 10*6/uL — AB (ref 3.70–5.45)
RDW: 19.1 % — ABNORMAL HIGH (ref 11.2–14.5)
WBC: 7.1 10*3/uL (ref 3.9–10.3)
lymph#: 0.6 10*3/uL — ABNORMAL LOW (ref 0.9–3.3)

## 2014-02-05 LAB — COMPREHENSIVE METABOLIC PANEL (CC13)
ALBUMIN: 3.9 g/dL (ref 3.5–5.0)
ALT: 12 U/L (ref 0–55)
AST: 17 U/L (ref 5–34)
Alkaline Phosphatase: 64 U/L (ref 40–150)
Anion Gap: 13 mEq/L — ABNORMAL HIGH (ref 3–11)
BILIRUBIN TOTAL: 0.66 mg/dL (ref 0.20–1.20)
BUN: 17.4 mg/dL (ref 7.0–26.0)
CO2: 25 mEq/L (ref 22–29)
Calcium: 10.9 mg/dL — ABNORMAL HIGH (ref 8.4–10.4)
Chloride: 103 mEq/L (ref 98–109)
Creatinine: 1.4 mg/dL — ABNORMAL HIGH (ref 0.6–1.1)
Glucose: 192 mg/dl — ABNORMAL HIGH (ref 70–140)
POTASSIUM: 4 meq/L (ref 3.5–5.1)
SODIUM: 142 meq/L (ref 136–145)
Total Protein: 6.9 g/dL (ref 6.4–8.3)

## 2014-02-05 MED ORDER — FENTANYL 50 MCG/HR TD PT72: 50.0000 ug | MEDICATED_PATCH | TRANSDERMAL | Status: DC

## 2014-02-05 NOTE — Progress Notes (Addendum)
Hematology and Oncology Follow Up Visit  Pam Vazquez [id number] 1946-09-28 68 y.o. 02/06/2014 10:46 AM     Principle Diagnosis:Pam Vazquez 68 y.o. female with metastatic endometrial adenocarcinoma   Prior Therapy: #1 In 2013 seen for grade 1 endometrial adenocarcinoma. She had undergone a robotic assisted total laparoscopic hysterectomy bilateral salpingo-oophorectomy right pelvic lymph node biopsy on 12/19/2011 without any complications. 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 There was no involvement of other organs lymphovascular invasion was not identified. 3 lymph nodes were negative for metastatic disease. 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 Patient subsequently continued to be seen by gynecologic oncology. In October 2013 patient had a Pap smear that was normal limits there was noted to be some granulation tissue that was removed from the cough. There is no evidence of disease.   #2patient was seen by Dr. Dahlia Client on 04/29/2013 she had vaginal exam performed that was notable for a nodule in the vagina concerning for recurrence. A rectal examination revealed a firm 8 cm mass also. Because of this she had a biopsy performed. She also had a CT scan of the abdomen and pelvis ordered to further evaluate the sigmoid mass. The pathology came back inconclusive. However the CT of the abdomen and pelvis showed a lobular mass at the vagina cuff measuring 7.0 x 3.2 cm proximally 4 cm craniocaudal dimension. The mass compressed the distal aspect of the sigmoid colon just above the rectum there was no mass approximates the posterior wall of the bladder without clear invasion noted evidence of obstruction of stool proximal to this mass. There was also  noted to be a large necrotic lymph node measuring 2.8 cm within the sigmoid mesocolon centrally. There are also small bilateral external iliac lymph nodes. There were no aggressive osseous lesions.   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   #4 patient is s/p palliative chemotherapy starting on 06/03/2013 with Taxol carboplatinum. She received 6 cycles and was re-imaged on 10/14/2013. There was an interval decrease in tumor burden therefore Dr. 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 She is s/p three cycles of this and received it from 11/27/13 through 01/15/14.   Current therapy:  S/p 3 cycles of Taxotere/Carbo  Interim History: Pam Vazquez 68 y.o. female  With metastatic endometrial adenocarcinoma.  She is doing moderately well today.  She also has an appointment with Dr. [name] today.  She is here to possibly get a fourth cycle of Taxotere and Carboplatin.  She is experiencing vaginal and rectal bleeding.  She states that this has been going on for about one and one half weeks.  She states that she has blood in her stool and when she passes gas.  Her vaginal bleeding is spotting, and she is concerned about this.  She does have lower back pain that is slightly increased.  At her last appointment with Dr. Humphrey Rolls she was having slightly more abdominal pain and Dr. Humphrey Rolls increased the dose of her Fentanyl patch to 50 mcg and Ms. Nicklaus reports that her pain is much better controlled with the increased dose of the Fentanyl patch.  Otherwise, she denies fevers, chills, nausea, vomiting, constipation, diarrhea,  numbness, or any further concerns.   Medications:  Current Outpatient Prescriptions  Medication Sig Dispense Refill  . amLODipine (NORVASC) 2.5 MG tablet Take 2.5 mg by mouth every morning.       . cyanocobalamin 2000 MCG  tablet Take 2,000 mcg by mouth 2 (two) times daily.      [name] dexamethasone (DECADRON) 4 MG tablet Take two tablets (8mg s) two times a day the day before Taxotere and for 3 days after chemotherapy. Don't take the day of Taxotere.  30 tablet  1  . doxycycline (VIBRA-TABS) 100 MG tablet Take 100 mg by mouth every other day.      . fentaNYL (DURAGESIC - DOSED MCG/HR) 50 MCG/HR Place 1 patch (50 mcg total) onto the skin every 3 (three) days.  10 patch  0  . gabapentin (NEURONTIN) 100 MG capsule Take 2 capsules (200 mg total) by mouth 3 (three) times daily.  180 capsule  1  . lidocaine-prilocaine (EMLA) cream Apply topically as needed.  30 g  6  . lisinopril-hydrochlorothiazide (PRINZIDE,ZESTORETIC) 20-25 MG per tablet Take 1 tablet by mouth daily after breakfast.       . loratadine (CLARITIN) 10 MG tablet Take 10 mg by mouth daily. Take as needed      . metFORMIN (GLUCOPHAGE) 500 MG tablet Take 500 mg by mouth 2 (two) times daily.       . metoprolol tartrate (LOPRESSOR) 25 MG tablet Take 25 mg by mouth 2 (two) times daily.       . Multiple Vitamin (MULTIVITAMIN WITH MINERALS) TABS Take 1 tablet by mouth every morning. She takes Dance movement psychotherapist.      [name] LORazepam (ATIVAN) 0.5 MG tablet Take 1 tablet (0.5 mg total) by mouth every 6 (six) hours as needed (Nausea or vomiting).  30 tablet  0  . magnesium oxide (MAG-OX) 400 (241.3 MG) MG tablet Take 1 tablet (400 mg total) by mouth 2 (two) times daily.  60 tablet  0  . omeprazole (PRILOSEC) 40 MG capsule Take 1 capsule (40 mg total) by mouth daily.  30 capsule  6  . ondansetron (ZOFRAN) 8 MG tablet Take 1 tablet (8 mg total) by mouth 2 (two) times daily. Take two times a day starting the day after chemo for 3 days. Then take two times a day as needed for nausea or vomiting.  30 tablet  1  . potassium chloride SA (K-DUR,KLOR-CON) 20 MEQ tablet Take 1 tablet (20 mEq total) by mouth 2 (two) times daily.  20 tablet  0  . prochlorperazine (COMPAZINE) 10 MG tablet Take 1  tablet (10 mg total) by mouth every 6 (six) hours as needed (Nausea or vomiting).  30 tablet  1   No current facility-administered medications for this visit.     Allergies: No Known Allergies  Medical History: Past Medical History  Diagnosis Date  . Diabetes mellitus   . PONV (postoperative nausea and vomiting)   . Cancer     endometrial  . Angina     stress test on chart from 3/12/ none since March 2012  . Chronic kidney disease     kidney stones  . Hypertension     / EKG 1/13 Epic    Surgical History:  Past Surgical History  Procedure Laterality Date  . Hysteroscopy w/d&c  11/21/2011    Procedure: DILATATION AND CURETTAGE /HYSTEROSCOPY;  Surgeon: Gus Height;  Location: [facility];  Service: Gynecology;  Laterality: N/A;  . Tubal ligation    .  Breast mass removal  1984, 1992    L breast  . Lithotripsy  2000, 2002  . Back surgery      91 and 2001  . Dilation and curettage of uterus    . Node dissection  12/19/2011    Procedure: NODE DISSECTION;  Surgeon: [name], MD PHD;  Location: [facility];  Service: Gynecology;  Laterality: N/A;     Review of Systems: A 10 point review of systems was conducted and is otherwise negative except for what is noted above.     Physical Exam: Blood pressure 122/72, pulse 93, temperature 98.2 F (36.8 C), temperature source Oral, resp. rate 18, height 5' (1.524 m), weight 142 lb 1.6 oz (64.456 kg). GENERAL: Patient is a well appearing female in no acute distress HEENT:  Sclerae anicteric.  Oropharynx clear and moist. No ulcerations or evidence of oropharyngeal candidiasis. Neck is supple.  NODES:  No cervical, supraclavicular, or axillary lymphadenopathy palpated.  LUNGS:  Clear to auscultation bilaterally.  No wheezes or rhonchi. HEART:  Regular rate and rhythm. No murmur appreciated. ABDOMEN:  Soft, nontender.  Positive, normoactive bowel sounds. No organomegaly palpated. MSK:  No focal spinal tenderness to palpation. Full range of  motion bilaterally in the upper extremities. EXTREMITIES:  No peripheral edema.   SKIN:  Clear with no obvious rashes or skin changes. No nail dyscrasia. NEURO:  Nonfocal. Well oriented.  Appropriate affect. ECOG PERFORMANCE STATUS: 1 - Symptomatic but completely ambulatory   Lab Results: Lab Results  Component Value Date   WBC 7.1 02/05/2014   HGB 9.2* 02/05/2014   HCT 27.9* 02/05/2014   MCV 102.8* 02/05/2014   PLT 115* 02/05/2014     Chemistry      Component Value Date/Time   NA 142 02/05/2014 0921   NA 141 12/20/2011 0334   K 4.0 02/05/2014 0921   K 4.1 12/20/2011 0334   CL 104 04/29/2013 1229   CL 104 12/20/2011 0334   CO2 25 02/05/2014 0921   CO2 27 12/20/2011 0334   BUN 17.4 02/05/2014 0921   BUN 10 12/20/2011 0334   CREATININE 1.4* 02/05/2014 0921   CREATININE 0.80 12/20/2011 0334      Component Value Date/Time   CALCIUM 10.9* 02/05/2014 0921   CALCIUM 10.0 12/18/2013 1015   ALKPHOS 64 02/05/2014 0921   ALKPHOS 69 12/15/2011 1125   AST 17 02/05/2014 0921   AST 22 12/15/2011 1125   ALT 12 02/05/2014 0921   ALT 19 12/15/2011 1125   BILITOT 0.66 02/05/2014 0921   BILITOT 0.8 12/15/2011 1125      Assessment and Plan: [name] 68 y.o. female With  1.  Metastatic endometrial adenocarcinoma:  After discussing the patient's lower back pain and rectal/vaginal bleeding it is possible that she has progressed.  Due to this, she will not receive chemotherapy today.  Instead, I ordered a CT chest, abdomen, and pelvis to further evaluate her recently increased pelvic pain and back pain along with rectal/vaginal bleeding.  2.  Pain control:  Patient recently had Fetntanyl patches increased to 83mcg.  These are working much better.  I refilled these for her today.  #10.  3.  Vaginal/rectal bleeding:  The patient will be evaluated by Dr. [name] today and likely undergo a pelvic examintaiton.  I recommended the patient f/u with her about the bleeding and possible etiologies and treatments.    The  patient will return the week of 02/23/14 after her scans and f/u with Dr. [name]  on 02/11/14 so we can discuss a possible treatment plan with her.  She knows to call our office with any questions or concerns and we can see her mcuh sooner if needed.    I spent 25 minutes counseling the patient face to face.  The total time spent in the appointment was 30 minutes.  Minette Headland, [place] [phone] 02/06/2014 10:46 AM   ADDENDUM:    I personally saw this patient and performed a substantive portion of this encounter with the listed APP documented above.   Patient with metastatic endometrial adenocarcinoma. She initially received 6 cycles of Taxol carboplatinum. Reimaging performed on 10/14/2013 showed interval decrease in tumor burden but there still was tumor present. She therefore received Taxotere carboplatinum from 11/27/2013 through 01/15/2014.  She is seen in followup today. I have recommended restaging studies. She will also be seen by Dr. [name] today for further recommendations. Question is whether she will can go on to receive more Taxotere carboplatinum versus change in therapy. We will await recommendations from Dr. [name] as well as results of restaging studies.  Marcy Panning, MD

## 2014-02-05 NOTE — Progress Notes (Signed)
Per LC - pt is seeing Dr. [name] on 4/2.  Not necessary to see [place] on 4/1.  LC appt to be rescheduled to wk of 4/13.  POF sent

## 2014-02-05 NOTE — Progress Notes (Signed)
Office Visit:  GYN ONCOLOGY   Chief Complaint:  Recurrent endometrial cancer  Assessment:   1) 68 y.o.  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  Her postop course has been complicated by persistent granulation tissue.   04/2013 vaginal examination is notable for a nodule in the vagina this was biopsied. A rectal examination firm 8 cm mass was noted.  Biopsy c/w recurrent endometroid cancer.  Ms Pack has received 6 cycles taxol/carbo.  Repeat CT 4 weeks after cycle 6 notabkle for interval decrease in tumor burden.  She has received 3 additional cycles of Taxol/Carbolast dose 01/2014. Reports rectal and vaginal bleeding.  Physical examination concerning for a recto vaginal septal mass and disease progression.  CT abdomen and pelvis. Options to consider are rectosigmoid resection or radiotherapy  if disease is localized Change of chemotherapy to doxil. F/U 02/12/2014 to discuss imaging results and treatment plan Advised to keep stool soft.    HPI:  Pam Vazquez is a 68 y.o. year old No obstetric history on file. initially seen in consultation on 11/27/2011 at the request of Dr. Melinda Crutch for evaluation of a grade 1 endometrial adenocarcinoma..  She then underwent a robotic-assisted total laparoscopic hysterectomy bilateral salpingo-oophorectomy right pelvic lymph node biopsy on 76/28/3151 without complications.  Her final pathologic diagnosis is a Stage Ia Grade 1 endometrioid endometrial cancer with no evidence of lymphovascular space invasion, 5/16 mm (31 %) of myometrial invasion and negative lymph nodes.  Pam Vazquez   presented to Dr. Delene Loll in October of 2013. A Pap test at that time was within normal limits and granulation tissue was removed from the cuff.     Subsequent visits to Dr. Gertie Fey in November December and January were all notable for the application of silver nitrate for management of the persistent vaginal cuff granulation  tissue.  At a visit 04/29/2013 granulation tissue was noted and a pelic mass was appreciated and biopsied.   Diagnosis Vagina, biopsy, apex - SLIGHTLY INFLAMED SQUAMOUS EPITHELIUM. - NO EVIDENCE OF DYSPLASIA OR MALIGNANCY.  CT  05/05/2013 1. Lobular mass at the vaginal cuff is consistent with endometrial carcinoma progression. 2. Mass extends to the serosal surface of the sigmoid colon and compresses the lumen of the sigmoid colon. No obstruction at this time.  3. Mass approximates the posterior wall of the bladder without clear invasion. 4. Large necrotic lymph node within the sigmoid mesocolon superior to the vaginal cuff. 5. Small bilateral iliac lymph nodes  CT guided bx 05/2013 Pelvis, biopsy, Central lower - METASTATIC CARCINOMA WITH ASSOCIATED TUMOR NECROSIS.  CT scan after 6 cycles showed tumor reduction.  Pam Vazquez has received 9 cycles taxol/Carboplatin last cycle 01/2014.  Reports rectal bleeding of 1.5 weeks.  Bleeding even with flatus and worse with constipation.  Reports narrowing of the caliber of her stool.  Vaginal spotting started today   Past Medical History  Diagnosis Date  . Diabetes mellitus   . PONV (postoperative nausea and vomiting)   . Cancer     endometrial  . Angina     stress test on chart from 3/12/ none since March 2012  . Chronic kidney disease     kidney stones  . Hypertension     / EKG 1/13 Epic   Past Surgical History  Procedure Laterality Date  . Hysteroscopy w/d&c  11/21/2011    Procedure: DILATATION AND CURETTAGE /HYSTEROSCOPY;  Surgeon: Gus Height;  Location: [facility];  Service: Gynecology;  Laterality: N/A;  . Tubal ligation    . Breast mass removal  1983/03/12, 1992    L breast  . Lithotripsy  2000, 2002  . Back surgery      1990-03-11 and 03/11/2000  . Dilation and curettage of uterus    . Node dissection  12/19/2011    Procedure: NODE DISSECTION;  Surgeon: [name], MD PHD;  Location: [facility];  Service: Gynecology;  Laterality: N/A;   Social Hx.     her husband died in 03-11-2013.   Review of systems: Constitutional:  Fair appetite Eyes: No blurred vision Ears, Nose, Mouth, Throat: No dizziness, headaches or changes in hearing.  Cardiovascular: No chest pain, palpitations or edema. Respiratory:  No shortness of breath, wheezing or cough Gastrointestinal: She has normal bowel movements without diarrhea or constipation. She denies any nausea or vomiting. Reports rectal bleeding for 1.5 weeks.  Constipation makes the bleeding worse.  States that there has been a change in the caliber of her stools. Genitourinary:  She denies pelvic pain, pelvic pressure or changes in her urinary function. She has no hematuria, dysuria, or incontinence. She reports vaginal bleeding started today. Musculoskeletal: Denies muscle weakness or joint pains.  Skin:  She has no skin changes, rashes or itching Neurological:  Denies dizziness or headaches. No neuropathy, no numbness or tingling. Hematologic/Lymphatic:   No easy bruising or bleeding   Physical Exam: General: Well dressed, well nourished in no apparent distress.   HEENT:  Normocephalic and atraumatic, no lesions.   Lungs:  Clear to auscultation bilaterally.  No wheezes. Cardiovascular:  Regular rate and rhythm.  No murmurs or rubs. Abdomen:  Soft, nontender, nondistended.  No palpable masses.  No hepatosplenomegaly.  No ascites. Normal bowel sounds.  No hernias or tenderness at the laparoscopic trocar sites. Genitourinary: Normal EGBUS  Vaginal cuff with separation at the apex with blood clot present.   Rectal: Good tone, no masses  de sac mass 4cm firm.  No intraluminal masses appreciated. Extremities: No cyanosis, clubbing or edema.  No calf tenderness or erythema. No palpable cords. Lymphatics:  No palpable supraclavicular, cervical, axillary or inguinal lymphadenopathy. Psychiatric: Mood and affect are appropriate. Neurological: Awake, alert and oriented x 3. Sensation is intact,  Musculoskeletal: No  pain, normal strength and range of motion.

## 2014-02-06 ENCOUNTER — Telehealth: Payer: Self-pay | Admitting: Oncology

## 2014-02-06 ENCOUNTER — Ambulatory Visit: Payer: Medicare Other

## 2014-02-06 NOTE — Telephone Encounter (Signed)
m °

## 2014-02-10 ENCOUNTER — Encounter (HOSPITAL_COMMUNITY): Payer: Self-pay

## 2014-02-10 ENCOUNTER — Ambulatory Visit (HOSPITAL_COMMUNITY)
Admission: RE | Admit: 2014-02-10 | Discharge: 2014-02-10 | Disposition: A | Discharge status: Home / Self Care | Payer: Medicare Other | Source: Ambulatory Visit | Attending: Gynecologic Oncology | Admitting: Gynecologic Oncology

## 2014-02-10 DIAGNOSIS — C541 Malignant neoplasm of endometrium: Secondary | ICD-10-CM

## 2014-02-10 DIAGNOSIS — Z981 Arthrodesis status: Secondary | ICD-10-CM | POA: Insufficient documentation

## 2014-02-10 DIAGNOSIS — K5900 Constipation, unspecified: Secondary | ICD-10-CM | POA: Insufficient documentation

## 2014-02-10 DIAGNOSIS — C549 Malignant neoplasm of corpus uteri, unspecified: Secondary | ICD-10-CM | POA: Insufficient documentation

## 2014-02-10 DIAGNOSIS — Z9221 Personal history of antineoplastic chemotherapy: Secondary | ICD-10-CM | POA: Insufficient documentation

## 2014-02-10 DIAGNOSIS — N281 Cyst of kidney, acquired: Secondary | ICD-10-CM | POA: Insufficient documentation

## 2014-02-10 DIAGNOSIS — D4989 Neoplasm of unspecified behavior of other specified sites: Secondary | ICD-10-CM | POA: Insufficient documentation

## 2014-02-10 DIAGNOSIS — R109 Unspecified abdominal pain: Secondary | ICD-10-CM | POA: Insufficient documentation

## 2014-02-10 MED ORDER — IOHEXOL 300 MG/ML  SOLN
100.0000 mL | Freq: Once | INTRAMUSCULAR | Status: AC | PRN
  Administered 2014-02-10: 100 mL via INTRAVENOUS

## 2014-02-11 ENCOUNTER — Ambulatory Visit: Payer: Medicare Other | Admitting: Adult Health

## 2014-02-11 ENCOUNTER — Telehealth: Payer: Self-pay | Admitting: Gynecologic Oncology

## 2014-02-11 ENCOUNTER — Other Ambulatory Visit: Payer: Medicare Other

## 2014-02-11 NOTE — Telephone Encounter (Signed)
Office Visit:  GYN ONCOLOGY   Chief Complaint:  Progressive endometrial cancer  Assessment:   1) 68 y.o.  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  Her postop course has been complicated by persistent granulation tissue.   04/2013 vaginal examination is notable for a nodule in the vagina this was biopsied. A rectal examination firm 8 cm mass was noted.  Biopsy c/w recurrent endometroid cancer.  Pam Vazquez has received 6 cycles taxol/carbo.  Repeat CT 4 weeks after cycle 6 notabkle for interval decrease in tumor burden.  She has received 3 additional cycles of Taxol/Carbolast dose 01/2014.  Physical examination and imaging confirm psease progression with pelvic  LN involvement.   Pelvic radiotherapy Change of chemotherapy to doxil.  Advised to keep stool soft.    HPI:  Pam Vazquez is a 68 y.o. year old No obstetric history on file. initially seen in consultation on 11/27/2011 at the request of Dr. Melinda Crutch for evaluation of a grade 1 endometrial adenocarcinoma..  She then underwent a robotic-assisted total laparoscopic hysterectomy bilateral salpingo-oophorectomy right pelvic lymph node biopsy on 78/29/5621 without complications.  Her final pathologic diagnosis is a Stage Ia Grade 1 endometrioid endometrial cancer with no evidence of lymphovascular space invasion, 5/16 mm (31 %) of myometrial invasion and negative lymph nodes.  Pam Vazquez   presented to Dr. Delene Loll in October of 2013. A Pap test at that time was within normal limits and granulation tissue was removed from the cuff.     Subsequent visits to Dr. Gertie Fey in November December and January were all notable for the application of silver nitrate for management of the persistent vaginal cuff granulation tissue.  At a visit 04/29/2013 granulation tissue was noted and a pelic mass was appreciated and biopsied.   Diagnosis Vagina, biopsy, apex - SLIGHTLY INFLAMED SQUAMOUS EPITHELIUM. - NO  EVIDENCE OF DYSPLASIA OR MALIGNANCY.  CT  05/05/2013 1. Lobular mass at the vaginal cuff is consistent with endometrial carcinoma progression. 2. Mass extends to the serosal surface of the sigmoid colon and compresses the lumen of the sigmoid colon. No obstruction at this time.  3. Mass approximates the posterior wall of the bladder without clear invasion. 4. Large necrotic lymph node within the sigmoid mesocolon superior to the vaginal cuff. 5. Small bilateral iliac lymph nodes  CT guided bx 05/2013 Pelvis, biopsy, Central lower - METASTATIC CARCINOMA WITH ASSOCIATED TUMOR NECROSIS.  CT scan after 6 cycles showed tumor reduction.  Pam Vazquez has received 9 cycles taxol/Carboplatin last cycle 01/2014.  Reports rectal bleeding of 1.5 weeks.  Bleeding even with flatus and worse with constipation.  Reports narrowing of the caliber of her stool and vaginal spotting.  The rectosigmoid mass appeared to have progressed on physical exam.  CT 02/10/2014 IMPRESSION:  1. Progressive multifocal tumor recurrence in the pelvis. Dominant  mass involves the vaginal cuff, and there are enlarged pelvic lymph  nodes and mesenteric implants as described. One of these appears to be involving the sigmoid colon. While there is no evidence of high-grade bowel obstruction at this time, this does place the patient at risk for bowel obstruction.  2. No ascites or generalized peritoneal nodularity. No evidence of hematogenous metastatic disease.    Past Medical History  Diagnosis Date  . Diabetes mellitus   . PONV (postoperative nausea and vomiting)   . Cancer     endometrial  . Angina     stress test on chart  from 3/12/ none since March 2012  . Chronic kidney disease     kidney stones  . Hypertension     / EKG 1/13 Epic   Past Surgical History  Procedure Laterality Date  . Hysteroscopy w/d&c  11/21/2011    Procedure: DILATATION AND CURETTAGE /HYSTEROSCOPY;  Surgeon: Gus Height;  Location: [facility];  Service:  Gynecology;  Laterality: N/A;  . Tubal ligation    . Breast mass removal  02-27-1983, 1992    L breast  . Lithotripsy  2000, 2002  . Back surgery      February 26, 1990 and 02-27-2000  . Dilation and curettage of uterus    . Node dissection  12/19/2011    Procedure: NODE DISSECTION;  Surgeon: [name], MD PHD;  Location: [facility];  Service: Gynecology;  Laterality: N/A;   Social Hx.    her husband died in 02-26-2013.   Review of systems: Constitutional:  Fair appetite Eyes: No blurred vision Ears, Nose, Mouth, Throat: No dizziness, headaches or changes in hearing.  Cardiovascular: No chest pain, palpitations or edema. Respiratory:  No shortness of breath, wheezing or cough Gastrointestinal: She has normal bowel movements without diarrhea or constipation. She denies any nausea or vomiting. Reports rectal bleeding for 1.5 weeks.  Constipation makes the bleeding worse.  States that there has been a change in the caliber of her stools. Genitourinary:  She denies pelvic pain, pelvic pressure or changes in her urinary function. She has no hematuria, dysuria, or incontinence. She reports vaginal bleeding started today. Musculoskeletal: Denies muscle weakness or joint pains.  Skin:  She has no skin changes, rashes or itching Neurological:  Denies dizziness or headaches. No neuropathy, no numbness or tingling. Hematologic/Lymphatic:   No easy bruising or bleeding   Physical Exam: General: Well dressed, well nourished in no apparent distress.   HEENT:  Normocephalic and atraumatic, no lesions.   Lungs:  Clear to auscultation bilaterally.  No wheezes. Cardiovascular:  Regular rate and rhythm.  No murmurs or rubs. Abdomen:  Soft, nontender, nondistended.  No palpable masses.  No hepatosplenomegaly.  No ascites. Normal bowel sounds.  No hernias or tenderness at the laparoscopic trocar sites. Genitourinary: Normal EGBUS  Vaginal cuff with separation at the apex with blood clot present.   Rectal: Good tone, no masses  de sac  mass 4cm firm.  No intraluminal masses appreciated. Extremities: No cyanosis, clubbing or edema.  No calf tenderness or erythema. No palpable cords. Lymphatics:  No palpable supraclavicular, cervical, axillary or inguinal lymphadenopathy. Psychiatric: Mood and affect are appropriate. Neurological: Awake, alert and oriented x 3. Sensation is intact,  Musculoskeletal: No pain, normal strength and range of motion.

## 2014-02-12 ENCOUNTER — Encounter: Payer: Self-pay | Admitting: Gynecologic Oncology

## 2014-02-12 ENCOUNTER — Ambulatory Visit: Payer: Medicare Other | Attending: Gynecologic Oncology | Admitting: Gynecologic Oncology

## 2014-02-12 VITALS — BP 125/63 | HR 90 | Temp 98.4°F | Ht 60.0 in | Wt 137.5 lb

## 2014-02-12 DIAGNOSIS — I10 Essential (primary) hypertension: Secondary | ICD-10-CM | POA: Insufficient documentation

## 2014-02-12 DIAGNOSIS — N939 Abnormal uterine and vaginal bleeding, unspecified: Secondary | ICD-10-CM

## 2014-02-12 DIAGNOSIS — C549 Malignant neoplasm of corpus uteri, unspecified: Secondary | ICD-10-CM | POA: Insufficient documentation

## 2014-02-12 DIAGNOSIS — Z9221 Personal history of antineoplastic chemotherapy: Secondary | ICD-10-CM | POA: Insufficient documentation

## 2014-02-12 DIAGNOSIS — Z87442 Personal history of urinary calculi: Secondary | ICD-10-CM | POA: Insufficient documentation

## 2014-02-12 DIAGNOSIS — E119 Type 2 diabetes mellitus without complications: Secondary | ICD-10-CM | POA: Insufficient documentation

## 2014-02-12 DIAGNOSIS — Z90710 Acquired absence of both cervix and uterus: Secondary | ICD-10-CM | POA: Insufficient documentation

## 2014-02-12 DIAGNOSIS — C52 Malignant neoplasm of vagina: Secondary | ICD-10-CM | POA: Insufficient documentation

## 2014-02-12 DIAGNOSIS — C541 Malignant neoplasm of endometrium: Secondary | ICD-10-CM

## 2014-02-12 NOTE — Progress Notes (Signed)
Office Visit:  GYN ONCOLOGY   Chief Complaint:  Progressive endometrial cancer  Assessment:   1) 68 y.o.  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  Her postop course has been complicated by persistent granulation tissue.   04/2013 vaginal examination is notable for a nodule in the vagina this was biopsied. A rectal examination firm 8 cm mass was noted.  Biopsy c/w recurrent endometroid cancer.  Pam Vazquez has received 6 cycles taxol/carbo.  Repeat CT 4 weeks after cycle 6 notabkle for interval decrease in tumor burden.  She has received 3 additional cycles of Taxol/Carbolast dose 01/2014.  Physical examination and imaging 01/2014 confirm disease progression of rectovaginal mass and pelvic  LN.  Pelvic radiotherapy recommended Patient was advised that the success rate of chemotherapy of agents other than taxane/platin is very low.  If the recurrence is not controlled with radiotherapy her outcome is not favourable  Referral to Drs Lynnda Child for pelvic radiotherapy  Advised to keep stool soft. Advised against heavy lifting. Advised to use oxycodone for breakthrough bleeding.   HPI:  Pam Vazquez is a 68 y.o. year old No obstetric history on file. initially seen in consultation on 11/27/2011 at the request of Dr. Melinda Crutch for evaluation of a grade 1 endometrial adenocarcinoma..  She then underwent a robotic-assisted total laparoscopic hysterectomy bilateral salpingo-oophorectomy right pelvic lymph node biopsy on 01/60/1093 without complications.  Her final pathologic diagnosis is a Stage Ia Grade 1 endometrioid endometrial cancer with no evidence of lymphovascular space invasion, 5/16 mm (31 %) of myometrial invasion and negative lymph nodes.  Pam Vazquez   presented to Dr. Delene Loll in October of 2013. A Pap test at that time was within normal limits  .  At a visit 04/29/2013 with Gyn Onc  granulation tissue was noted and a pevlic mass was  appreciated and biopsied.   Diagnosis Vagina, biopsy, apex - SLIGHTLY INFLAMED SQUAMOUS EPITHELIUM. - NO EVIDENCE OF DYSPLASIA OR MALIGNANCY.  CT  05/05/2013 1. Lobular mass at the vaginal cuff is consistent with endometrial carcinoma progression. 2. Mass extends to the serosal surface of the sigmoid colon and compresses the lumen of the sigmoid colon. No obstruction at this time.  3. Mass approximates the posterior wall of the bladder without clear invasion. 4. Large necrotic lymph node within the sigmoid mesocolon superior to the vaginal cuff. 5. Small bilateral iliac lymph nodes  CT guided bx 05/2013 Pelvis, biopsy, Central lower - METASTATIC CARCINOMA WITH ASSOCIATED TUMOR NECROSIS.  CT scan after 6 cycles showed tumor reduction.  Pam Vazquez has received 9 cycles taxol/Carboplatin last cycle 01/2014.  Reports rectal bleeding of 1.5 weeks.  Bleeding even with flatus and worse with constipation.  Reports narrowing of the caliber of her stool and vaginal spotting.  The rectosigmoid mass appeared to have progressed on physical exam.  CT 02/10/2014 IMPRESSION:  1. Progressive multifocal tumor recurrence in the pelvis. Dominant  mass involves the vaginal cuff, and there are enlarged pelvic lymph  nodes and mesenteric implants as described. One of these appears to be involving the sigmoid colon. While there is no evidence of high-grade bowel obstruction at this time, this does place the patient at risk for bowel obstruction.  2. No ascites or generalized peritoneal nodularity. No evidence of hematogenous metastatic disease.    Past Medical History  Diagnosis Date  . Diabetes mellitus   . PONV (postoperative nausea and vomiting)   . Cancer  endometrial  . Angina     stress test on chart from 3/12/ none since March 2012  . Chronic kidney disease     kidney stones  . Hypertension     / EKG 1/13 Epic   Past Surgical History  Procedure Laterality Date  . Hysteroscopy w/d&c   11/21/2011    Procedure: DILATATION AND CURETTAGE /HYSTEROSCOPY;  Surgeon: Gus Height;  Location: [facility];  Service: Gynecology;  Laterality: N/A;  . Tubal ligation    . Breast mass removal  02/25/83, 1992    L breast  . Lithotripsy  2000, 2002  . Back surgery      02/24/90 and 02-25-2000  . Dilation and curettage of uterus    . Node dissection  12/19/2011    Procedure: NODE DISSECTION;  Surgeon: [name], MD PHD;  Location: [facility];  Service: Gynecology;  Laterality: N/A;   Social Hx.    her husband died in 02-24-13.   Review of systems: Constitutional:  Fair appetite Ears, Nose, Mouth, Throat: No dizziness, headaches or changes in hearing.  Cardiovascular: No chest pain, palpitations or edema. Respiratory:  No shortness of breath, wheezing or cough Gastrointestinal: She has normal bowel movements without diarrhea or constipation. She denies any nausea or vomiting. Reports a change in the caliber of her stools. Genitourinary:  She denies pelvic pain, pelvic pressure or changes in her urinary function. She has no hematuria, dysuria, or incontinence. She reports vaginal bleeding started today. Neurological:  Denies dizziness or headaches. No neuropathy, no numbness or tingling. Hematologic/Lymphatic:   Vaginal bleeding  Physical Exam: General: Well dressed, well nourished in no apparent distress.   Abdomen:  Soft, nontender, nondistended.  No palpable masses.  No hepatosplenomegaly.  No ascites. Normal bowel sounds.  No hernias or tenderness at the laparoscopic trocar sites. Genitourinary: Normal EGBUS  Vaginal cuff with separation at the apex with active bleeding, 2cm mass removed.  Monsels placed at the apex and bleeding was controlled with pressure Extremities: No cyanosis, clubbing or edema.  No calf tenderness or erythema. No palpable cords. ke, alert and oriented x 3. Sensation is intact,  Musculoskeletal: No pain, normal strength and range of motion.

## 2014-02-12 NOTE — Patient Instructions (Addendum)
Plan to see Dr. Sondra Come in the near future.  Please call for any questions or concerns.   CT scan with evidence of disease progression. Will schedule consult with Drs Sondra Come or Isidore Moos in Radiation Oncology Hold chemotherapy  No heavy lifting.  F/U in 2 months

## 2014-02-16 ENCOUNTER — Encounter: Payer: Self-pay | Admitting: Radiation Oncology

## 2014-02-16 ENCOUNTER — Telehealth: Payer: Self-pay | Admitting: Gynecologic Oncology

## 2014-02-16 NOTE — Progress Notes (Signed)
GYN Location of Tumor / Histology: Stage Ia Grade 1 endometrioid endometrial cancer   Patient presented in 2013 with endometrial cancer.  She had a robotic-assisted total laparoscopic hysterectomy bilateral salpingo-oophorectomy right pelvic lymph node biopsy on 12/19/2011.  At a visit 04/29/2013 with Gyn Onc granulation tissue was noted and a pelvic mass was appreciated and biopsied.   Biopsies of  (if applicable) revealed:   From 05/14/2013 Diagnosis Pelvis, biopsy, Central lower - METASTATIC CARCINOMA WITH ASSOCIATED TUMOR NECROSIS.  From 02/12/2014 Diagnosis Vagina, biopsy, cuff - INVOLVED BY CARCINOMA, SEE COMMENT. Diagnosis Note There is extensive carcinoma with necrosis. There are areas of squamous differentiation. The patient's prior resection ([id number]) was reviewed and has similar morphology. Given the morphology along with the patient's history, these findings are consistent with involvement by the patient's endometrioid adenocarcinoma. Dr. [name] was called on 01/16/2014.  Past/Anticipated interventions by Gyn/Onc surgery, if any: robotic-assisted total laparoscopic hysterectomy bilateral salpingo-oophorectomy right pelvic lymph node biopsy on 12/19/2011    Past/Anticipated interventions by medical oncology, if any: received 6 cycles taxol/carbo. Repeat CT 4 weeks after cycle 6 notable for interval decrease in tumor burden. She has received 3 additional cycles of Taxol/Carbolast dose 01/2014.  Weight changes, if any: lost 30 lb over the last 6 months  Bowel/Bladder complaints, if any: constipation - uses prune juice and docusate. Last BM was Sunday.  Nausea/Vomiting, if any: none  Pain issues, if any:  Has pain in her left side. Currently taking oxycodone - she does not remember the strength - usually at night.  SAFETY ISSUES:  Prior radiation? no  Pacemaker/ICD? no  Possible current pregnancy? no  Is the patient on methotrexate? no  Current Complaints / other  details:  Has two children.  Retired from [employer] after 33 years.

## 2014-02-16 NOTE — Telephone Encounter (Signed)
Patient informed of biopsy results.  Appt with Dr. Sondra Come tomorrow.  No concerns voiced.  Advised to call for any questions or concerns.

## 2014-02-17 ENCOUNTER — Ambulatory Visit
Admission: RE | Admit: 2014-02-17 | Discharge: 2014-02-17 | Disposition: A | Discharge status: Home / Self Care | Payer: Medicare Other | Source: Ambulatory Visit | Attending: Radiation Oncology | Admitting: Radiation Oncology

## 2014-02-17 ENCOUNTER — Ambulatory Visit: Payer: Medicare Other | Admitting: Radiation Oncology

## 2014-02-17 ENCOUNTER — Encounter: Payer: Self-pay | Admitting: Radiation Oncology

## 2014-02-17 ENCOUNTER — Ambulatory Visit: Payer: Medicare Other

## 2014-02-17 VITALS — BP 104/63 | HR 92 | Temp 98.3°F | Resp 20 | Ht 60.0 in | Wt 137.5 lb

## 2014-02-17 DIAGNOSIS — C541 Malignant neoplasm of endometrium: Secondary | ICD-10-CM

## 2014-02-17 DIAGNOSIS — C549 Malignant neoplasm of corpus uteri, unspecified: Secondary | ICD-10-CM | POA: Insufficient documentation

## 2014-02-17 DIAGNOSIS — Z510 Encounter for antineoplastic radiation therapy: Secondary | ICD-10-CM | POA: Insufficient documentation

## 2014-02-17 NOTE — Progress Notes (Signed)
Please see the Nurse Progress Note in the MD Initial Consult Encounter for this patient. 

## 2014-02-17 NOTE — Progress Notes (Signed)
Radiation Oncology         [phone]) [phone] ________________________________  Initial Outpatient Consultation  Name: Pam Vazquez MRN: [id number]  Date: 02/17/2014  DOB: December 23, 1945  XT:KWIOXB,DZHGD B, MD  [name], MD   REFERRING PHYSICIAN: [name], MD  DIAGNOSIS: Recurrent endometrial cancer  HISTORY OF PRESENT ILLNESS::Pam Vazquez is a 68 y.o. female who is seen out courtesy of Dr. [name] for an opinion concerning radiation therapy as part of management of the patient's progressive endometrial cancer. She wasinitially seen in consultation on 11/27/2011 at the request of Dr. Melinda Crutch for evaluation of a grade 1 endometrial adenocarcinoma.. She then underwent a robotic-assisted total laparoscopic hysterectomy bilateral salpingo-oophorectomy right pelvic lymph node biopsy on 92/42/6834 without complications. Her final pathologic diagnosis is a Stage Ia Grade 1 endometrioid endometrial cancer with no evidence of lymphovascular space invasion, 5/16 mm (31 %) of myometrial invasion and negative lymph nodes.  The patient did well until June of 2014 when she was noted to have some granulation tissue in the vaginal area. A pelvic mass was also appreciated on clinical exam.  The patient underwent a pelvic biopsy in July of 2014 which confirmed recurrent disease.  The patient was treated with 6 cycles of Taxol/carboplatinum. A repeat CT scan showed interval decrease in the tumor burden. Patient then proceeded with 3 additional  cycles of Taxol carboplatinum.   patient most recently with noted to have some vaginal bleeding and a repeat CT scan showed progression of disease within the pelvis area. No significant disease was noted in the abdomen or chest region.  On pelvic examination patient was noted to have a 2 cm vaginal mass bleeding in the apex region. This mass was removed and Monsel's were placed to control bleeding. With These findings the patient is now seen in radiation oncology  for consideration for palliative treatment.   PREVIOUS RADIATION THERAPY: No  PAST MEDICAL HISTORY:  has a past medical history of Diabetes mellitus; PONV (postoperative nausea and vomiting); Cancer; Angina; Chronic kidney disease; and Hypertension.    PAST SURGICAL HISTORY: Past Surgical History  Procedure Laterality Date  . Hysteroscopy w/d&c  11/21/2011    Procedure: DILATATION AND CURETTAGE /HYSTEROSCOPY;  Surgeon: Gus Height;  Location: [facility];  Service: Gynecology;  Laterality: N/A;  . Tubal ligation    . Breast mass removal  1984, 1992    L breast  . Lithotripsy  2000, 2002  . Back surgery      91 and 2001  . Dilation and curettage of uterus    . Node dissection  12/19/2011    Procedure: NODE DISSECTION;  Surgeon: [name], MD PHD;  Location: [facility];  Service: Gynecology;  Laterality: N/A;  . Robotic assisted total hysterectomy with bilateral salpingo oopherectomy  12/19/2011    robotic-assisted total laparoscopic hysterectomy bilateral salpingo-oophorectomy right pelvic lymph node biopsy on 12/19/2011 h    FAMILY HISTORY: family history includes Heart attack in her mother; Heart disease in her mother; Stomach cancer in her maternal grandmother.  SOCIAL HISTORY:  reports that she quit smoking about 28 years ago. She does not have any smokeless tobacco history on file. She reports that she does not drink alcohol or use illicit drugs.  ALLERGIES: Review of patient's allergies indicates no known allergies.  MEDICATIONS:  Current Outpatient Prescriptions  Medication Sig Dispense Refill  . amLODipine (NORVASC) 2.5 MG tablet Take 2.5 mg by mouth every morning.       . cyanocobalamin 2000 MCG tablet  Take 2,000 mcg by mouth 2 (two) times daily.      [name] dexamethasone (DECADRON) 4 MG tablet Take two tablets (8mg s) two times a day the day before Taxotere and for 3 days after chemotherapy. Don't take the day of Taxotere.  30 tablet  1  . fentaNYL (DURAGESIC - DOSED MCG/HR) 50 MCG/HR  Place 1 patch (50 mcg total) onto the skin every 3 (three) days.  10 patch  0  . gabapentin (NEURONTIN) 100 MG capsule Take 2 capsules (200 mg total) by mouth 3 (three) times daily.  180 capsule  1  . lisinopril-hydrochlorothiazide (PRINZIDE,ZESTORETIC) 20-25 MG per tablet Take 1 tablet by mouth daily after breakfast.       . loratadine (CLARITIN) 10 MG tablet Take 10 mg by mouth daily. Take as needed      . LORazepam (ATIVAN) 0.5 MG tablet Take 1 tablet (0.5 mg total) by mouth every 6 (six) hours as needed (Nausea or vomiting).  30 tablet  0  . magnesium oxide (MAG-OX) 400 (241.3 MG) MG tablet Take 1 tablet (400 mg total) by mouth 2 (two) times daily.  60 tablet  0  . metFORMIN (GLUCOPHAGE) 500 MG tablet Take 500 mg by mouth 2 (two) times daily.       . metoprolol tartrate (LOPRESSOR) 25 MG tablet Take 25 mg by mouth 2 (two) times daily.       . Multiple Vitamin (MULTIVITAMIN WITH MINERALS) TABS Take 1 tablet by mouth every morning. She takes Dance movement psychotherapist.      [name] omeprazole (PRILOSEC) 40 MG capsule Take 40 mg by mouth daily as needed.      . ondansetron (ZOFRAN) 8 MG tablet Take 1 tablet (8 mg total) by mouth 2 (two) times daily. Take two times a day starting the day after chemo for 3 days. Then take two times a day as needed for nausea or vomiting.  30 tablet  1  . oxyCODONE (OXY IR/ROXICODONE) 5 MG immediate release tablet Take 5 mg by mouth every 4 (four) hours as needed for severe pain.      . potassium chloride SA (K-DUR,KLOR-CON) 20 MEQ tablet Take 1 tablet (20 mEq total) by mouth 2 (two) times daily.  20 tablet  0  . doxycycline (VIBRA-TABS) 100 MG tablet Take 100 mg by mouth every other day.      . lidocaine-prilocaine (EMLA) cream Apply topically as needed.  30 g  6  . prochlorperazine (COMPAZINE) 10 MG tablet Take 1 tablet (10 mg total) by mouth every 6 (six) hours as needed (Nausea or vomiting).  30 tablet  1   No current facility-administered medications for this encounter.     REVIEW OF SYSTEMS:  A 15 point review of systems is documented in the electronic medical record. This was obtained by the nursing staff. However, I reviewed this with the patient to discuss relevant findings and make appropriate changes. She continues to have some vaginal discharge/bleeding. She changes her pads several times during the day. Patient also has some pain in the left pelvis region. She denies any hematuria. She has had some mild rectal bleeding. Patient has noticed more problems with constipation and has noticed a decrease in the caliber of her stool.    PHYSICAL EXAM:  height is 5' (1.524 m) and weight is 137 lb 8 oz (62.37 kg). Her temperature is 98.3 F (36.8 C). Her blood pressure is 104/63 and her pulse is 92. Her respiration is 20.   BP 104/63  Pulse 92  Temp(Src) 98.3 F (36.8 C)  Resp 20  Ht 5' (1.524 m)  Wt 137 lb 8 oz (62.37 kg)  BMI 26.85 kg/m2  General Appearance:    Alert, cooperative, no distress, appears stated age  Head:    Normocephalic, without obvious abnormality, atraumatic  Eyes:    PERRL, conjunctiva/corneas clear, EOM's intact,     Ears:    Normal TM's and external ear canals, both ears  Nose:   Nares normal, septum midline, mucosa normal, no drainage    or sinus tenderness  Throat:   Lips, mucosa, and tongue normal; teeth and gums normal  Neck:   Supple, symmetrical, trachea midline, no adenopathy;    thyroid:  no enlargement/tenderness/nodules; no carotid   bruit or JVD  Back:     Symmetric, no curvature, ROM normal, no CVA tenderness  Lungs:     Clear to auscultation bilaterally, respirations unlabored  Chest Wall:    No tenderness or deformity   Heart:    Regular rate and rhythm, S1 and S2 normal, no murmur, rub   or gallop     Abdomen:     Soft, non-tender, bowel sounds active all four quadrants,    no masses, no organomegaly  Genitalia:    pelvic exam not performed in light of bleeding issues and recent monsels application       Extremities:   Extremities normal, atraumatic, no cyanosis or edema  Pulses:   2+ and symmetric all extremities  Skin:   Skin color, texture, turgor normal, no rashes or lesions  Lymph nodes:   Cervical, supraclavicular, and axillary nodes normal  Neurologic:   normal strength, sensation and reflexes    throughout     ECOG = 1    1 - Symptomatic but completely ambulatory (Restricted in physically strenuous activity but ambulatory and able to carry out work of a light or sedentary nature. For example, light housework, office work)  LABORATORY DATA:  Lab Results  Component Value Date   WBC 7.1 02/05/2014   HGB 9.2* 02/05/2014   HCT 27.9* 02/05/2014   MCV 102.8* 02/05/2014   PLT 115* 02/05/2014   NEUTROABS 6.3 02/05/2014   Lab Results  Component Value Date   NA 142 02/05/2014   K 4.0 02/05/2014   CL 104 04/29/2013   CO2 25 02/05/2014   GLUCOSE 192* 02/05/2014   CREATININE 1.4* 02/05/2014   CALCIUM 10.9* 02/05/2014      RADIOGRAPHY:  Ct Abdomen Pelvis W Contrast  02/10/2014   CLINICAL DATA:  Endometrial carcinoma diagnosed in January 2014. Chemotherapy and progress. Left-sided abdominal pain with constipation.  EXAM: CT CHEST, ABDOMEN, AND PELVIS WITH CONTRAST  TECHNIQUE: Multidetector CT imaging of the chest, abdomen and pelvis was performed following the standard protocol during bolus administration of intravenous contrast.  CONTRAST:  137mL OMNIPAQUE IOHEXOL 300 MG/ML  SOLN  COMPARISON:  CT CHEST W/CM dated 10/14/2013; CT CHEST W/CM dated 08/06/2013  FINDINGS: CT CHEST FINDINGS  There are no enlarged mediastinal, hilar or axillary lymph nodes. Right IJ Port-A-Cath tip extends to the SVC right atrial junction. There is stable mild aortic atherosclerosis.  The heart size is normal. There is no pleural or pericardial effusion. The lungs are clear.  Small partially calcified lesions within the right breast are grossly stable.  There are no worrisome osseous findings.  CT ABDOMEN AND PELVIS  FINDINGS  The liver, gallbladder, biliary system, spleen and adrenal glands remain unremarkable in appearance there is stable pancreatic atrophy.  A cyst in the upper pole of the left kidney and diffuse cortical scarring of the right kidney are stable. There is no hydronephrosis or delay in contrast excretion.  There is progressive disease within the pelvis. An irregular mass at the vaginal cuff is difficult to measure on the axial images, measuring approximately 4.1 x 3.2 cm on image 97. This lesion is more apparent on the reformatted images, measuring up to 5.4 cm on coronal image number 55. There is an adjacent ill-defined lesion which appears to be involving the sigmoid colon, best seen on axial image 93. Anterior to the sigmoid colon is a separate 2.4 x 2.2 cm nodule on image 92 (previously 1.5 cm maximally). Dominant right external iliac node measures 3.4 x 2.6 cm on image 89 (previously 1.4 cm short axis). There are additional smaller pelvic sidewall lymph nodes bilaterally. No abdominal lymphadenopathy is demonstrated. There is no ascites or peritoneal nodularity.  The stomach and small bowel appear normal. There is no evidence of bowel obstruction. There is no definite bladder invasion, although the bladder is nearly empty and suboptimally evaluated.  There are no worrisome osseous findings status post lower lumbar fusion. Associated anterolisthesis appears stable.  IMPRESSION: 1. Progressive multifocal tumor recurrence in the pelvis. Dominant mass involves the vaginal cuff, and there are enlarged pelvic lymph nodes and mesenteric implants as described. One of these appears to be involving the sigmoid colon. While there is no evidence of high-grade bowel obstruction at this time, this does place the patient at risk for bowel obstruction. 2. No ascites or generalized peritoneal nodularity. No evidence of hematogenous metastatic disease. 3. Stable renal cortical scarring on the right.  No hydronephrosis. 4.  No acute chest findings or evidence of thoracic metastatic disease.   Electronically Signed   By: Camie Patience M.D.   On: 02/10/2014 09:50       IMPRESSION: Recurrent endometrial cancer.  Patient is symptomatic with a recurrence in the pelvis area and is not responding to chemotherapy at this time. She would be a candidate for palliative radiation therapy directed the areas of recurrence within the pelvis region. I discussed the treatment course side effects and potential toxicities of radiation therapy in this situation with the patient. She appears to understand and wishes to proceed with planned course of treatment.  PLAN: Simulation and planning later today. I anticipate 5 weeks of radiation therapy as part of her management. She will likely be treated with IMRT to limit dose to uninvolved areas of the bowel.  Total time spent in this encounter including medical record review,  x-ray evaluation, examination counseling and coordination of care was 80 minutes.   ------------------------------------------------  Blair Promise, PhD, MD

## 2014-02-19 NOTE — Progress Notes (Signed)
  Radiation Oncology         ([phone]) [phone] ________________________________  Name: Pam Vazquez MRN: [id number]  Date: 02/17/2014  DOB: 12-09-1945  SIMULATION AND TREATMENT PLANNING NOTE  DIAGNOSIS:  Recurrent endometrial cancer  NARRATIVE:  The patient was brought to the [place] suite.  Identity was confirmed.  All relevant records and images related to the planned course of therapy were reviewed.  The patient freely provided informed written consent to proceed with treatment after reviewing the details related to the planned course of therapy. The consent form was witnessed and verified by the simulation staff.  Then, the patient was set-up in a stable reproducible  supine position for radiation therapy.  CT images were obtained.  Surface markings were placed.  The CT images were loaded into the planning software.  Then the target and avoidance structures were contoured.  Treatment planning then occurred.  The radiation prescription was entered and confirmed.  Then, I designed and supervised the construction of a total of 1 medically necessary complex treatment devices.  I have requested : Intensity Modulated Radiotherapy (IMRT) is medically necessary for this case for the following reason:  Small bowel sparing..  I have ordered:dose calc.  PLAN:  The patient will receive 45 Gy in 25 fractions.  ________________________________  Rulon Sera.D.

## 2014-02-23 ENCOUNTER — Ambulatory Visit
Admission: RE | Admit: 2014-02-23 | Discharge: 2014-02-23 | Disposition: A | Discharge status: Home / Self Care | Payer: Medicare Other | Source: Ambulatory Visit | Attending: Radiation Oncology | Admitting: Radiation Oncology

## 2014-02-23 ENCOUNTER — Other Ambulatory Visit: Payer: Self-pay

## 2014-02-23 DIAGNOSIS — C541 Malignant neoplasm of endometrium: Secondary | ICD-10-CM

## 2014-02-23 NOTE — Progress Notes (Addendum)
Hematology and Oncology Follow Up Visit  Pam Vazquez [id number] 24-Jun-1946 68 y.o. 02/25/2014 8:59 AM     Principle Diagnosis:Pam Vazquez 68 y.o. female with metastatic endometrial carcinoma.     Prior Therapy:  #1 In 2013 seen for grade 1 endometrial adenocarcinoma. She had undergone a robotic assisted total laparoscopic hysterectomy bilateral salpingo-oophorectomy right pelvic lymph node biopsy on 12/19/2011 without any complications. 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 There was no involvement of other organs lymphovascular invasion was not identified. 3 lymph nodes were negative for metastatic disease. 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 Patient subsequently continued to be seen by gynecologic oncology. In October 2013 patient had a Pap smear that was normal limits there was noted to be some granulation tissue that was removed from the cough. There is no evidence of disease.   #2patient was seen by Dr. Dahlia Client on 04/29/2013 she had vaginal exam performed that was notable for a nodule in the vagina concerning for recurrence. A rectal examination revealed a firm 8 cm mass also. Because of this she had a biopsy performed. She also had a CT scan of the abdomen and pelvis ordered to further evaluate the sigmoid mass. The pathology came back inconclusive. However the CT of the abdomen and pelvis showed a lobular mass at the vagina cuff measuring 7.0 x 3.2 cm proximally 4 cm craniocaudal dimension. The mass compressed the distal aspect of the sigmoid colon just above the rectum there was no mass approximates the posterior wall of the bladder without clear invasion noted evidence of obstruction of stool proximal to this mass. There was also  noted to be a large necrotic lymph node measuring 2.8 cm within the sigmoid mesocolon centrally. There are also small bilateral external iliac lymph nodes. There were no aggressive osseous lesions.   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  #4 patient is s/p palliative chemotherapy starting on 06/03/2013 with Taxol carboplatinum. She received 6 cycles and was re-imaged on 10/14/2013. There was an interval decrease in tumor burden therefore Dr. 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 She is s/p three cycles of this and received it from 11/27/13 through 01/15/14.  #5 Patient underwent a CT of the chest, abdomen, and pelvis on 02/10/14 and it demonstrated disease progression.  She was referred by Dr. [name] to Dr. Sondra Come for radiation which started on 02/23/14.     Current therapy:  Radiation therapy  Interim History: Pam Vazquez 68 y.o. female with metastatic endometrial carcinoma.  She is here for f/u and is accompanied today by her daughter.  She recently underwent scans that demonstrated progression.  She started radiation therapy with Dr. Sondra Come on 02/23/14.  She is having increased pain in her abdomen, particularly in the left lower quadrant and near the left anterior iliac crest and Oxycodone doesn't help as much as tylenol does.  She is requesting to increase her fentanyl patches.  She is tolerating radiation therapy very well.  She started it yesterday.  She did see Dr. Sondra Come today as well.  She is slightly constipated and is working on her bowel regimen with prune juice, prunes and milk of magnesia.  She  does not want any help or suggestions to assist with managing this.  Otherwise, a 10 point ROS is negative.    Medications:  Current Outpatient Prescriptions  Medication Sig Dispense Refill  . amLODipine (NORVASC) 2.5 MG  tablet Take 2.5 mg by mouth every morning.       . cyanocobalamin 2000 MCG tablet Take 2,000 mcg by mouth 2 (two) times daily.      [name] doxycycline (VIBRA-TABS) 100 MG tablet Take 100 mg by mouth every other day.      . fentaNYL (DURAGESIC - DOSED MCG/HR) 50 MCG/HR Place 1 patch (50 mcg total) onto the skin every 3 (three) days.  10 patch  0  . gabapentin (NEURONTIN) 100 MG capsule Take 2 capsules (200 mg total) by mouth 3 (three) times daily.  180 capsule  1  . lidocaine-prilocaine (EMLA) cream Apply topically as needed.  30 g  6  . lisinopril-hydrochlorothiazide (PRINZIDE,ZESTORETIC) 20-25 MG per tablet Take 1 tablet by mouth daily after breakfast.       . loratadine (CLARITIN) 10 MG tablet Take 10 mg by mouth daily. Take as needed      . magnesium oxide (MAG-OX) 400 (241.3 MG) MG tablet Take 1 tablet (400 mg total) by mouth 2 (two) times daily.  60 tablet  0  . metFORMIN (GLUCOPHAGE) 500 MG tablet Take 500 mg by mouth 2 (two) times daily.       . metoprolol tartrate (LOPRESSOR) 25 MG tablet Take 25 mg by mouth 2 (two) times daily.       . Multiple Vitamin (MULTIVITAMIN WITH MINERALS) TABS Take 1 tablet by mouth every morning. She takes Dance movement psychotherapist.      [name] omeprazole (PRILOSEC) 40 MG capsule Take 40 mg by mouth daily as needed.      [name] oxyCODONE (OXY IR/ROXICODONE) 5 MG immediate release tablet Take 5 mg by mouth every 4 (four) hours as needed for severe pain.      [name] dexamethasone (DECADRON) 4 MG tablet Take two tablets (8mg s) two times a day the day before Taxotere and for 3 days after chemotherapy. Don't take the day of Taxotere.  30 tablet  1  . fentaNYL (DURAGESIC - DOSED MCG/HR) 25 MCG/HR patch Place 1 patch (25 mcg total) onto the skin every 3 (three) days.  5 patch  0  . LORazepam (ATIVAN) 0.5 MG tablet Take 1 tablet (0.5 mg total) by mouth every 6 (six) hours as needed (Nausea or vomiting).  30 tablet  0  . ondansetron (ZOFRAN) 8 MG tablet Take 1 tablet (8 mg total) by mouth 2 (two) times  daily. Take two times a day starting the day after chemo for 3 days. Then take two times a day as needed for nausea or vomiting.  30 tablet  1  . potassium chloride SA (K-DUR,KLOR-CON) 20 MEQ tablet Take 1 tablet (20 mEq total) by mouth 2 (two) times daily.  20 tablet  0  . prochlorperazine (COMPAZINE) 10 MG tablet Take 1 tablet (10 mg total) by mouth every 6 (six) hours as needed (Nausea or vomiting).  30 tablet  1   No current facility-administered medications for this visit.     Allergies: No Known Allergies  Medical History: Past Medical History  Diagnosis Date  . Diabetes mellitus   . PONV (postoperative nausea and vomiting)   . Cancer     endometrial  . Angina     stress test on chart from 3/12/ none since March 2012  .  Chronic kidney disease     kidney stones  . Hypertension     / EKG 1/13 Epic    Surgical History:  Past Surgical History  Procedure Laterality Date  . Hysteroscopy w/d&c  11/21/2011    Procedure: DILATATION AND CURETTAGE /HYSTEROSCOPY;  Surgeon: Gus Height;  Location: [facility];  Service: Gynecology;  Laterality: N/A;  . Tubal ligation    . Breast mass removal  1984, 1992    L breast  . Lithotripsy  2000, 2002  . Back surgery      91 and 2001  . Dilation and curettage of uterus    . Node dissection  12/19/2011    Procedure: NODE DISSECTION;  Surgeon: [name], MD PHD;  Location: [facility];  Service: Gynecology;  Laterality: N/A;  . Robotic assisted total hysterectomy with bilateral salpingo oopherectomy  12/19/2011    robotic-assisted total laparoscopic hysterectomy bilateral salpingo-oophorectomy right pelvic lymph node biopsy on 12/19/2011 h     Review of Systems: A 10 point review of systems was conducted and is otherwise negative except for what is noted above.    Physical Exam: Blood pressure 102/61, pulse 83, temperature 98.8 F (37.1 C), temperature source Oral, resp. rate 18, height 5' (1.524 m), weight 136 lb 12.8 oz (62.052 kg). GENERAL:  Patient is a well appearing female in no acute distress HEENT:  Sclerae anicteric.  Oropharynx clear and moist. No ulcerations or evidence of oropharyngeal candidiasis. Neck is supple.  NODES:  No cervical, supraclavicular, or axillary lymphadenopathy palpated.  LUNGS:  Clear to auscultation bilaterally.  No wheezes or rhonchi. HEART:  Regular rate and rhythm. No murmur appreciated. ABDOMEN:  Soft, nontender.  Positive, normoactive bowel sounds. No organomegaly palpated. EXTREMITIES:  No peripheral edema.   SKIN:  Clear with no obvious rashes or skin changes. No nail dyscrasia. NEURO:  Nonfocal. Well oriented.  Appropriate affect. ECOG PERFORMANCE STATUS: 1 - Symptomatic but completely ambulatory   Lab Results: Lab Results  Component Value Date   WBC 8.8 02/24/2014   HGB 9.6* 02/24/2014   HCT 30.9* 02/24/2014   MCV 104.7* 02/24/2014   PLT 227 02/24/2014     Chemistry      Component Value Date/Time   NA 144 02/24/2014 1131   NA 141 12/20/2011 0334   K 3.5 02/24/2014 1131   K 4.1 12/20/2011 0334   CL 104 04/29/2013 1229   CL 104 12/20/2011 0334   CO2 28 02/24/2014 1131   CO2 27 12/20/2011 0334   BUN 14.1 02/24/2014 1131   BUN 10 12/20/2011 0334   CREATININE 1.2* 02/24/2014 1131   CREATININE 0.80 12/20/2011 0334      Component Value Date/Time   CALCIUM 10.7* 02/24/2014 1131   CALCIUM 10.0 12/18/2013 1015   ALKPHOS 60 02/24/2014 1131   ALKPHOS 69 12/15/2011 1125   AST 20 02/24/2014 1131   AST 22 12/15/2011 1125   ALT 11 02/24/2014 1131   ALT 19 12/15/2011 1125   BILITOT 0.35 02/24/2014 1131   BILITOT 0.8 12/15/2011 1125       Radiological Studies: CLINICAL DATA: Endometrial carcinoma diagnosed in January 2014.  Chemotherapy and progress. Left-sided abdominal pain with  constipation.  EXAM:  CT CHEST, ABDOMEN, AND PELVIS WITH CONTRAST  TECHNIQUE:  Multidetector CT imaging of the chest, abdomen and pelvis was  performed following the standard protocol during bolus  administration of intravenous  contrast.  CONTRAST: 170mL OMNIPAQUE IOHEXOL 300 MG/ML SOLN  COMPARISON: CT CHEST W/CM dated 10/14/2013; CT CHEST  W/CM dated  08/06/2013  FINDINGS:  CT CHEST FINDINGS  There are no enlarged mediastinal, hilar or axillary lymph nodes.  Right IJ Port-A-Cath tip extends to the SVC right atrial junction.  There is stable mild aortic atherosclerosis.  The heart size is normal. There is no pleural or pericardial  effusion. The lungs are clear.  Small partially calcified lesions within the right breast are  grossly stable.  There are no worrisome osseous findings.  CT ABDOMEN AND PELVIS FINDINGS  The liver, gallbladder, biliary system, spleen and adrenal glands  remain unremarkable in appearance there is stable pancreatic  atrophy.  A cyst in the upper pole of the left kidney and diffuse cortical  scarring of the right kidney are stable. There is no hydronephrosis  or delay in contrast excretion.  There is progressive disease within the pelvis. An irregular mass at  the vaginal cuff is difficult to measure on the axial images,  measuring approximately 4.1 x 3.2 cm on image 97. This lesion is  more apparent on the reformatted images, measuring up to 5.4 cm on  coronal image number 55. There is an adjacent ill-defined lesion  which appears to be involving the sigmoid colon, best seen on axial  image 93. Anterior to the sigmoid colon is a separate 2.4 x 2.2 cm  nodule on image 92 (previously 1.5 cm maximally). Dominant right  external iliac node measures 3.4 x 2.6 cm on image 89 (previously  1.4 cm short axis). There are additional smaller pelvic sidewall  lymph nodes bilaterally. No abdominal lymphadenopathy is  demonstrated. There is no ascites or peritoneal nodularity.  The stomach and small bowel appear normal. There is no evidence of  bowel obstruction. There is no definite bladder invasion, although  the bladder is nearly empty and suboptimally evaluated.  There are no worrisome osseous  findings status post lower lumbar  fusion. Associated anterolisthesis appears stable.  IMPRESSION:  1. Progressive multifocal tumor recurrence in the pelvis. Dominant  mass involves the vaginal cuff, and there are enlarged pelvic lymph  nodes and mesenteric implants as described. One of these appears to  be involving the sigmoid colon. While there is no evidence of  high-grade bowel obstruction at this time, this does place the  patient at risk for bowel obstruction.  2. No ascites or generalized peritoneal nodularity. No evidence of  hematogenous metastatic disease.  3. Stable renal cortical scarring on the right. No hydronephrosis.  4. No acute chest findings or evidence of thoracic metastatic  disease.  Electronically Signed  By: Camie Patience M.D.  On: 02/10/2014 09:50   Assessment and Plan: SHANEAN DILLEN 68 y.o. female with  1. Metastatic endometrial carcinoma.  Patient underwent CT scans on 02/10/14 that demonstrated progression of her disease (please see above).  She followed up with Dr. [name] who recommended radiation therapy by Dr. Sondra Come.  The patient started radiation on 02/23/14.  She will continue with this, and we will discuss further chemotherapy plans after radiation is complete.    2. Pain control. I increased her Fentanyl patch to 75 mcg.  She will try this.  Should it not improve we will increase to 11mcg.  She will continue tylenol and oxycodone prn.    The patient will return in 2 weeks for labs and evaluation.  She and her daughter know to contact us in the interim for any questions or concerns.  We can certainly see her sooner if needed.   I spent 25 minutes counseling  the patient face to face.  The total time spent in the appointment was 30 minutes.  Minette Headland, [place] [phone] 02/25/2014 8:59 AM  . ADDENDUM:    I personally saw this patient and performed a substantive portion of this encounter with  the listed APP documented above.   68 year old female with history of metastatic endometrial carcinoma she has been treated with multiple chemotherapeutic agents. Most recently she was given Taxotere and carboplatinum. Restaging scans performed on 02/10/2014 unfortunately demonstrated progressive disease. She is now receiving radiation therapy by Dr. Sondra Come. This was begun on 02/23/2014. She will plan on completing this.  Once patient completes radiation therapy then we will discuss further systemic treatment with chemotherapy.   Patient does have history of chronic pain due to her disease. She has been on final patches this will be increased to 75 mcg. Hopefully this will help. She also does take oral medications for breakthrough pain.  Patient will be seen back in 2 weeks' time for followup or sooner if need arises  [name], MD

## 2014-02-24 ENCOUNTER — Ambulatory Visit
Admission: RE | Admit: 2014-02-24 | Discharge: 2014-02-24 | Disposition: A | Discharge status: Home / Self Care | Payer: Medicare Other | Source: Ambulatory Visit | Attending: Radiation Oncology | Admitting: Radiation Oncology

## 2014-02-24 ENCOUNTER — Telehealth: Payer: Self-pay | Admitting: Oncology

## 2014-02-24 ENCOUNTER — Ambulatory Visit: Payer: Medicare Other | Admitting: Radiation Oncology

## 2014-02-24 ENCOUNTER — Encounter: Payer: Self-pay | Admitting: Radiation Oncology

## 2014-02-24 ENCOUNTER — Encounter: Payer: Self-pay | Admitting: Adult Health

## 2014-02-24 ENCOUNTER — Other Ambulatory Visit (HOSPITAL_BASED_OUTPATIENT_CLINIC_OR_DEPARTMENT_OTHER): Payer: Medicare Other

## 2014-02-24 ENCOUNTER — Ambulatory Visit (HOSPITAL_BASED_OUTPATIENT_CLINIC_OR_DEPARTMENT_OTHER): Payer: Medicare Other | Admitting: Adult Health

## 2014-02-24 VITALS — BP 102/61 | HR 83 | Temp 98.8°F | Resp 18 | Ht 60.0 in | Wt 136.8 lb

## 2014-02-24 VITALS — BP 86/50 | HR 85 | Temp 99.1°F | Resp 20 | Wt 136.4 lb

## 2014-02-24 DIAGNOSIS — C549 Malignant neoplasm of corpus uteri, unspecified: Secondary | ICD-10-CM

## 2014-02-24 DIAGNOSIS — C541 Malignant neoplasm of endometrium: Secondary | ICD-10-CM

## 2014-02-24 DIAGNOSIS — G893 Neoplasm related pain (acute) (chronic): Secondary | ICD-10-CM

## 2014-02-24 LAB — COMPREHENSIVE METABOLIC PANEL (CC13)
ALT: 11 U/L (ref 0–55)
ANION GAP: 14 meq/L — AB (ref 3–11)
AST: 20 U/L (ref 5–34)
Albumin: 3.7 g/dL (ref 3.5–5.0)
Alkaline Phosphatase: 60 U/L (ref 40–150)
BUN: 14.1 mg/dL (ref 7.0–26.0)
CALCIUM: 10.7 mg/dL — AB (ref 8.4–10.4)
CHLORIDE: 102 meq/L (ref 98–109)
CO2: 28 meq/L (ref 22–29)
Creatinine: 1.2 mg/dL — ABNORMAL HIGH (ref 0.6–1.1)
GLUCOSE: 157 mg/dL — AB (ref 70–140)
POTASSIUM: 3.5 meq/L (ref 3.5–5.1)
SODIUM: 144 meq/L (ref 136–145)
TOTAL PROTEIN: 6.7 g/dL (ref 6.4–8.3)
Total Bilirubin: 0.35 mg/dL (ref 0.20–1.20)

## 2014-02-24 LAB — CBC WITH DIFFERENTIAL/PLATELET
BASO%: 0.1 % (ref 0.0–2.0)
Basophils Absolute: 0 10*3/uL (ref 0.0–0.1)
EOS ABS: 0.2 10*3/uL (ref 0.0–0.5)
EOS%: 1.7 % (ref 0.0–7.0)
HCT: 30.9 % — ABNORMAL LOW (ref 34.8–46.6)
HEMOGLOBIN: 9.6 g/dL — AB (ref 11.6–15.9)
LYMPH%: 15.4 % (ref 14.0–49.7)
MCH: 32.5 pg (ref 25.1–34.0)
MCHC: 31.1 g/dL — ABNORMAL LOW (ref 31.5–36.0)
MCV: 104.7 fL — ABNORMAL HIGH (ref 79.5–101.0)
MONO#: 0.6 10*3/uL (ref 0.1–0.9)
MONO%: 7.1 % (ref 0.0–14.0)
NEUT%: 75.7 % (ref 38.4–76.8)
NEUTROS ABS: 6.7 10*3/uL — AB (ref 1.5–6.5)
Platelets: 227 10*3/uL (ref 145–400)
RBC: 2.95 10*6/uL — ABNORMAL LOW (ref 3.70–5.45)
RDW: 15.8 % — AB (ref 11.2–14.5)
WBC: 8.8 10*3/uL (ref 3.9–10.3)
lymph#: 1.4 10*3/uL (ref 0.9–3.3)

## 2014-02-24 MED ORDER — FENTANYL 25 MCG/HR TD PT72
25.0000 ug | MEDICATED_PATCH | TRANSDERMAL | Status: DC
Start: 2014-02-24 — End: 2014-03-05

## 2014-02-24 NOTE — Telephone Encounter (Signed)
gv and printed appt sched and avs for pt for April  °

## 2014-02-24 NOTE — Progress Notes (Signed)
Pt denies pain today. She states she did not sleep well last night due to lower back, pelvic pain and pain in her left buttock area. She is wearing Duragesic patch 50 mcg. She takes Tylenol ES 1 tab prn w/good relief. Pt states she had diarrhea x 4 three days ago, none since. She did not take Imodium; advised she buy and keep on hand. Pt has taken BP medications today, hypotensive. She states she will call her PCP today to discuss whether she needs to have BP meds changed. Advised pt to take her BP in morning prior to taking BP meds if PCP makes no changes. Pt has loss of appetite but did eat full breakfast today. Discussed need for adequate fluid intake daily of 8 glasses. Post sim ed completed w/pt. Gave pt "Radiation and You" booklet w/all pertinent information marked and discussed, re: diarrhea/management, fatigue, hair loss, nausea/vomiting/management, skin irritation/care, urinary//bladder irritation/management, nutrition, pain. Reviewed lists on pg 54, 55. Pt verbalized understanding.

## 2014-02-24 NOTE — Progress Notes (Signed)
[facility]     Rexene Edison, M.D. [place], [place] [address]               Blair Promise, M.D., Ph.D. Phone: [phone]      [place] A. Tammi Klippel, M.D. Fax: [phone]      Jodelle Gross, M.D., Ph.D.         Thea Silversmith, M.D.         Wyvonnia Lora, M.D Weekly Treatment Management Note  Name: Pam Vazquez     MRN: [id number]        CSN: [id number] Date: 02/24/2014      DOB: [birth year]  CC: Pam Lair, MD         [name]    Status: Outpatient  Diagnosis: The encounter diagnosis was Cancer of endometrium.  Current Dose: 3.6 Gy  Current Fraction: 2/25  Planned Dose: 45 Gy  Narrative: Pam Vazquez was seen today for weekly treatment management. The chart was checked and CBCT  were reviewed. She is tolerating the treatments well thus far. Her pain is unchanged and possibly worse over the past several days. She will be speaking to medical oncology about increasing her Duragesic. She denies any significant vaginal bleeding at this time. Her blood pressure has been running low and she will discuss this issue with her primary care physician.  Review of patient's allergies indicates no known allergies. Current Outpatient Prescriptions  Medication Sig Dispense Refill  . amLODipine (NORVASC) 2.5 MG tablet Take 2.5 mg by mouth every morning.       . cyanocobalamin 2000 MCG tablet Take 2,000 mcg by mouth 2 (two) times daily.      [name] doxycycline (VIBRA-TABS) 100 MG tablet Take 100 mg by mouth every other day.      . fentaNYL (DURAGESIC - DOSED MCG/HR) 50 MCG/HR Place 1 patch (50 mcg total) onto the skin every 3 (three) days.  10 patch  0  . gabapentin (NEURONTIN) 100 MG capsule Take 2 capsules (200 mg total) by mouth 3 (three) times daily.  180 capsule  1  . lidocaine-prilocaine (EMLA) cream Apply topically as needed.  30 g  6  . lisinopril-hydrochlorothiazide (PRINZIDE,ZESTORETIC) 20-25 MG per tablet Take 1 tablet by  mouth daily after breakfast.       . loratadine (CLARITIN) 10 MG tablet Take 10 mg by mouth daily. Take as needed      . LORazepam (ATIVAN) 0.5 MG tablet Take 1 tablet (0.5 mg total) by mouth every 6 (six) hours as needed (Nausea or vomiting).  30 tablet  0  . magnesium oxide (MAG-OX) 400 (241.3 MG) MG tablet Take 1 tablet (400 mg total) by mouth 2 (two) times daily.  60 tablet  0  . metFORMIN (GLUCOPHAGE) 500 MG tablet Take 500 mg by mouth 2 (two) times daily.       . metoprolol tartrate (LOPRESSOR) 25 MG tablet Take 25 mg by mouth 2 (two) times daily.       . Multiple Vitamin (MULTIVITAMIN WITH MINERALS) TABS Take 1 tablet by mouth every morning. She takes Dance movement psychotherapist.      [name] omeprazole (PRILOSEC) 40 MG capsule Take 40 mg by mouth daily as needed.      . ondansetron (ZOFRAN) 8 MG tablet Take 1 tablet (8 mg total) by mouth 2 (two) times daily. Take two times a day starting the day after chemo for 3 days. Then take two  times a day as needed for nausea or vomiting.  30 tablet  1  . oxyCODONE (OXY IR/ROXICODONE) 5 MG immediate release tablet Take 5 mg by mouth every 4 (four) hours as needed for severe pain.      [name] prochlorperazine (COMPAZINE) 10 MG tablet Take 1 tablet (10 mg total) by mouth every 6 (six) hours as needed (Nausea or vomiting).  30 tablet  1  . dexamethasone (DECADRON) 4 MG tablet Take two tablets (8mg s) two times a day the day before Taxotere and for 3 days after chemotherapy. Don't take the day of Taxotere.  30 tablet  1  . potassium chloride SA (K-DUR,KLOR-CON) 20 MEQ tablet Take 1 tablet (20 mEq total) by mouth 2 (two) times daily.  20 tablet  0   No current facility-administered medications for this encounter.   Labs:  Lab Results  Component Value Date   WBC 7.1 02/05/2014   HGB 9.2* 02/05/2014   HCT 27.9* 02/05/2014   MCV 102.8* 02/05/2014   PLT 115* 02/05/2014   Lab Results  Component Value Date   CREATININE 1.4* 02/05/2014   BUN 17.4 02/05/2014   NA 142 02/05/2014   K  4.0 02/05/2014   CL 104 04/29/2013   CO2 25 02/05/2014   Lab Results  Component Value Date   ALT 12 02/05/2014   AST 17 02/05/2014   BILITOT 0.66 02/05/2014    Physical Examination:  Filed Vitals:   02/24/14 1019  BP: 86/50  Pulse: 85  Temp:   Resp:     Wt Readings from Last 3 Encounters:  02/24/14 136 lb 6.4 oz (61.871 kg)  02/17/14 137 lb 8 oz (62.37 kg)  02/12/14 137 lb 8 oz (62.37 kg)     Lungs - Normal respiratory effort, chest expands symmetrically. Lungs are clear to auscultation, no crackles or wheezes.  Heart has regular rhythm and rate  Abdomen is soft and non tender with normal bowel sounds  Assessment:  Patient tolerating treatments well  Plan: Continue treatment per original radiation prescription

## 2014-02-25 ENCOUNTER — Other Ambulatory Visit: Payer: Self-pay | Admitting: Oncology

## 2014-02-25 ENCOUNTER — Ambulatory Visit
Admission: RE | Admit: 2014-02-25 | Discharge: 2014-02-25 | Disposition: A | Discharge status: Home / Self Care | Payer: Medicare Other | Source: Ambulatory Visit | Attending: Radiation Oncology | Admitting: Radiation Oncology

## 2014-02-26 ENCOUNTER — Encounter: Payer: Self-pay | Admitting: *Deleted

## 2014-02-26 ENCOUNTER — Ambulatory Visit
Admission: RE | Admit: 2014-02-26 | Discharge: 2014-02-26 | Disposition: A | Discharge status: Home / Self Care | Payer: Medicare Other | Source: Ambulatory Visit | Attending: Radiation Oncology | Admitting: Radiation Oncology

## 2014-02-26 DIAGNOSIS — C541 Malignant neoplasm of endometrium: Secondary | ICD-10-CM

## 2014-02-26 NOTE — Progress Notes (Unsigned)
Reviewed  Radiation therapuyy and book with patient, gave sitz bath with instructions, after discussing side effects and , pain, nutrition, patient said Pam Vazquez gave me a book and went over this", good revieew though 5:58 PM

## 2014-02-27 ENCOUNTER — Telehealth: Payer: Self-pay | Admitting: Oncology

## 2014-02-27 ENCOUNTER — Ambulatory Visit
Admission: RE | Admit: 2014-02-27 | Discharge: 2014-02-27 | Disposition: A | Discharge status: Home / Self Care | Payer: Medicare Other | Source: Ambulatory Visit | Attending: Radiation Oncology | Admitting: Radiation Oncology

## 2014-02-27 NOTE — Telephone Encounter (Signed)
s.w. pt and advised pt on appt on 4.14...Marland Kitchenpt was ok and aware.

## 2014-03-02 ENCOUNTER — Ambulatory Visit
Admission: RE | Admit: 2014-03-02 | Discharge: 2014-03-02 | Disposition: A | Discharge status: Home / Self Care | Payer: Medicare Other | Source: Ambulatory Visit | Attending: Radiation Oncology | Admitting: Radiation Oncology

## 2014-03-03 ENCOUNTER — Ambulatory Visit
Admission: RE | Admit: 2014-03-03 | Discharge: 2014-03-03 | Disposition: A | Discharge status: Home / Self Care | Payer: Medicare Other | Source: Ambulatory Visit | Attending: Radiation Oncology | Admitting: Radiation Oncology

## 2014-03-03 VITALS — BP 115/71 | HR 85 | Temp 98.3°F | Ht 60.0 in | Wt 137.0 lb

## 2014-03-03 DIAGNOSIS — C541 Malignant neoplasm of endometrium: Secondary | ICD-10-CM

## 2014-03-03 NOTE — Progress Notes (Signed)
  Radiation Oncology         ([phone]) [phone] ________________________________  Name: KYSA CALAIS MRN: [id number]  Date: 03/03/2014  DOB: [birth year]  Weekly Radiation Therapy Management  Current Dose: 12.6 Gy     Planned Dose:  45 Gy  Narrative . . . . . . . . The patient presents for routine under treatment assessment.                                   The patient is without complaint. Her pain is unchanged thus far.. She is continuing to have problems with constipation but is working on this issue with medication.                                 Set-up films were reviewed.                                 The chart was checked. Physical Findings. . .  height is 5' (1.524 m) and weight is 137 lb (62.143 kg). Her temperature is 98.3 F (36.8 C). Her blood pressure is 115/71 and her pulse is 85. . Weight essentially stable.  No significant changes. Impression . . . . . . . The patient is tolerating radiation. Plan . . . . . . . . . . . . Continue treatment as planned.  ________________________________   Blair Promise, PhD, MD

## 2014-03-03 NOTE — Progress Notes (Signed)
Pam Vazquez has had 7 fractions to her pelvis.  She denies pain.  She is currently using a fentanyl patch 75 mcg and tylenol 500 mg TID PRN.  She denies nausea.  Her weight is the same as last week.  She reports that she does not feel like eating   She reports constipation.  She denies bladder issues.  She reports a small amount of rectal bleeding.  She denies fatigue.

## 2014-03-04 ENCOUNTER — Other Ambulatory Visit: Payer: Self-pay | Admitting: Oncology

## 2014-03-04 ENCOUNTER — Telehealth: Payer: Self-pay | Admitting: Oncology

## 2014-03-04 ENCOUNTER — Ambulatory Visit
Admission: RE | Admit: 2014-03-04 | Discharge: 2014-03-04 | Disposition: A | Discharge status: Home / Self Care | Payer: Medicare Other | Source: Ambulatory Visit | Attending: Radiation Oncology | Admitting: Radiation Oncology

## 2014-03-04 DIAGNOSIS — C541 Malignant neoplasm of endometrium: Secondary | ICD-10-CM

## 2014-03-04 NOTE — Telephone Encounter (Signed)
s/w pt re seeing LL 4/23. KK out - pt agrees to see LL. pt has new appt d/t for 4/23 for lb/LL @ 1:30pm. appt moved to LL per LC - LL aware. appt for 4/28 cxd.

## 2014-03-05 ENCOUNTER — Ambulatory Visit: Payer: Medicare Other

## 2014-03-05 ENCOUNTER — Ambulatory Visit
Admission: RE | Admit: 2014-03-05 | Discharge: 2014-03-05 | Disposition: A | Discharge status: Home / Self Care | Payer: Medicare Other | Source: Ambulatory Visit | Attending: Radiation Oncology | Admitting: Radiation Oncology

## 2014-03-05 ENCOUNTER — Ambulatory Visit (HOSPITAL_BASED_OUTPATIENT_CLINIC_OR_DEPARTMENT_OTHER): Payer: Medicare Other | Admitting: Oncology

## 2014-03-05 ENCOUNTER — Encounter: Payer: Self-pay | Admitting: Oncology

## 2014-03-05 ENCOUNTER — Other Ambulatory Visit (HOSPITAL_BASED_OUTPATIENT_CLINIC_OR_DEPARTMENT_OTHER): Payer: Medicare Other

## 2014-03-05 ENCOUNTER — Telehealth: Payer: Self-pay | Admitting: Oncology

## 2014-03-05 VITALS — BP 115/66 | HR 81 | Temp 98.5°F | Resp 18 | Ht 60.0 in | Wt 137.1 lb

## 2014-03-05 DIAGNOSIS — Z95828 Presence of other vascular implants and grafts: Secondary | ICD-10-CM

## 2014-03-05 DIAGNOSIS — C549 Malignant neoplasm of corpus uteri, unspecified: Secondary | ICD-10-CM

## 2014-03-05 DIAGNOSIS — C541 Malignant neoplasm of endometrium: Secondary | ICD-10-CM

## 2014-03-05 DIAGNOSIS — E119 Type 2 diabetes mellitus without complications: Secondary | ICD-10-CM

## 2014-03-05 DIAGNOSIS — D649 Anemia, unspecified: Secondary | ICD-10-CM

## 2014-03-05 LAB — CBC WITH DIFFERENTIAL/PLATELET
BASO%: 0.2 % (ref 0.0–2.0)
BASOS ABS: 0 10*3/uL (ref 0.0–0.1)
EOS%: 2 % (ref 0.0–7.0)
Eosinophils Absolute: 0.1 10*3/uL (ref 0.0–0.5)
HCT: 29.3 % — ABNORMAL LOW (ref 34.8–46.6)
HEMOGLOBIN: 8.9 g/dL — AB (ref 11.6–15.9)
LYMPH#: 1.3 10*3/uL (ref 0.9–3.3)
LYMPH%: 29 % (ref 14.0–49.7)
MCH: 32.2 pg (ref 25.1–34.0)
MCHC: 30.4 g/dL — ABNORMAL LOW (ref 31.5–36.0)
MCV: 106.2 fL — ABNORMAL HIGH (ref 79.5–101.0)
MONO#: 0.4 10*3/uL (ref 0.1–0.9)
MONO%: 9.5 % (ref 0.0–14.0)
NEUT%: 59.3 % (ref 38.4–76.8)
NEUTROS ABS: 2.6 10*3/uL (ref 1.5–6.5)
Platelets: 186 10*3/uL (ref 145–400)
RBC: 2.76 10*6/uL — AB (ref 3.70–5.45)
RDW: 15.3 % — AB (ref 11.2–14.5)
WBC: 4.4 10*3/uL (ref 3.9–10.3)

## 2014-03-05 LAB — IRON AND TIBC CHCC
%SAT: 15 % — ABNORMAL LOW (ref 21–57)
IRON: 40 ug/dL — AB (ref 41–142)
TIBC: 269 ug/dL (ref 236–444)
UIBC: 229 ug/dL (ref 120–384)

## 2014-03-05 LAB — COMPREHENSIVE METABOLIC PANEL (CC13)
ALT: 7 U/L (ref 0–55)
AST: 14 U/L (ref 5–34)
Albumin: 3.4 g/dL — ABNORMAL LOW (ref 3.5–5.0)
Alkaline Phosphatase: 53 U/L (ref 40–150)
Anion Gap: 13 mEq/L — ABNORMAL HIGH (ref 3–11)
BUN: 13.6 mg/dL (ref 7.0–26.0)
CALCIUM: 10.3 mg/dL (ref 8.4–10.4)
CHLORIDE: 107 meq/L (ref 98–109)
CO2: 27 mEq/L (ref 22–29)
Creatinine: 0.8 mg/dL (ref 0.6–1.1)
GLUCOSE: 120 mg/dL (ref 70–140)
POTASSIUM: 4.2 meq/L (ref 3.5–5.1)
SODIUM: 147 meq/L — AB (ref 136–145)
TOTAL PROTEIN: 6.3 g/dL — AB (ref 6.4–8.3)
Total Bilirubin: 0.26 mg/dL (ref 0.20–1.20)

## 2014-03-05 LAB — FERRITIN CHCC: Ferritin: 130 ng/ml (ref 9–269)

## 2014-03-05 LAB — MAGNESIUM (CC13): MAGNESIUM: 1.4 mg/dL — AB (ref 1.5–2.5)

## 2014-03-05 MED ORDER — FENTANYL 25 MCG/HR TD PT72: 25.0000 ug | MEDICATED_PATCH | TRANSDERMAL | Status: DC

## 2014-03-05 MED ORDER — SODIUM CHLORIDE 0.9 % IJ SOLN
10.0000 mL | INTRAMUSCULAR | Status: DC | PRN
  Administered 2014-03-05: 10 mL via INTRAVENOUS
  Filled 2014-03-05: qty 10

## 2014-03-05 MED ORDER — HEPARIN SOD (PORK) LOCK FLUSH 100 UNIT/ML IV SOLN
500.0000 [IU] | Freq: Once | INTRAVENOUS | Status: AC
  Administered 2014-03-05: 500 [IU] via INTRAVENOUS
  Filled 2014-03-05: qty 5

## 2014-03-05 MED ORDER — HEMOCYTE 324 (106 FE) MG PO TABS: 1.0000 | ORAL_TABLET | Freq: Every day | ORAL | Status: DC

## 2014-03-05 MED ORDER — FENTANYL 50 MCG/HR TD PT72: 50.0000 ug | MEDICATED_PATCH | TRANSDERMAL | Status: DC

## 2014-03-05 NOTE — Progress Notes (Signed)
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 Radiation therapy is underway to pelvis, planned thru 04-01-2014. This is the first time that I have met her, and I have reviewed EMR information from [facility] physicians involved. Last chemotherapy was 9 cycles of carbopatin with taxane from 06-03-13 thru 01-15-14 (6 cycles carbo + taxol 06-03-13 thru 09-23-2013, then 3 cycles carbo + taxotere from 11-27-13 thru 01-15-14).  Last imaging was CT CAP 02-10-14. She saw Dr [name] on 02-12-14, with biopsy of bleeding area at apex of vagina.  Patient had vaginal bleeding for ~ past 6 weeks, but no frank vaginal bleeding since [place] and ?packing of vagina on 02-12-14. She  reports foul odor and some brownish vaginal drainage, as we as blood clots from rectum with each bowel movement, not large. Bowels are moving only every 2-3 days. Pain in low back and left lower abdomen is better controlled on duragesic 75 mcg patches, with occasional oxycodone tho she tries to use plain tylenol during day, in part due to constipation. She is more fatigued tho not frankly SOB.  She has PAC, last flushed 01-15-14, so was flushed after visit today.    ONCOLOGIC HISTORY #1  She had robotic assisted total laparoscopic hysterectomy bilateral salpingo-oophorectomy right pelvic lymph node biopsy on 12/19/2011. 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 There was no involvement of other organs lymphovascular invasion was not identified. 3 lymph nodes were negative for metastatic disease. 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   #2patient was seen by Dr. [name] on 04/29/2013 with a nodule in the vagina concerning for recurrence as well as 8 cm  rectal  mass.  However the CT of the abdomen and pelvis showed a lobular mass at the vagina cuff measuring 7.0 x 3.2 cm proximally 4 cm craniocaudal dimension. The mass compressed the distal aspect of the sigmoid colon just above the rectum there was no mass approximates the posterior wall of the bladder without clear invasion noted evidence of obstruction of stool proximal to this mass. There was also noted to be a large necrotic lymph node measuring 2.8 cm within the sigmoid mesocolon centrally. There are also small bilateral external iliac lymph nodes. There were no aggressive osseous lesions.  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  #4 patient is s/p palliative chemotherapy starting on 06/03/2013 with Taxol carboplatinum. She received 6 cycles and was re-imaged on 10/14/2013. There was an interval decrease in tumor burden therefore Dr. 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 She is s/p three cycles of this and received it from 11/27/13 through 01/15/14.  #5 Patient underwent a CT of the chest, abdomen, and pelvis on 02/10/14 and it demonstrated disease progression. She was referred by Dr. [name] to Dr. Sondra Come for radiation which started on 02/23/14.     Review of systems as above, also: No fever. Seeping some better. No significant nausea. Voiding ok. No LE swelling. Remainder of 10 point Review of Systems negative.   Social: lives in [place]. Does not drive herself to appointments now. Past tobacco DCd  1987   Objective:  Vital signs in last 24 hours:  BP 115/66  Pulse 81  Temp(Src) 98.5 F (36.9 C) (Oral)  Resp 18  Ht 5' (1.524 m)  Wt 137 lb 1.6 oz (62.188 kg)  BMI 26.78 kg/m2 Weight is stable. Alert, oriented and  appropriate. Ambulatory without assistance.   HEENT:PERRL, sclerae not icteric. Oral mucosa moist without lesions, posterior pharynx clear.  Neck supple. No JVD.  Lymphatics:no cervical,suraclavicular adenopathy Resp: clear to auscultation bilaterally and normal percussion bilaterally Cardio: regular rate and rhythm. No gallop. GI: soft,not tender, not distended. Few bowel sounds. No obvious ascites. No appreciable HSM. Musculoskeletal/ Extremities: without pitting edema, cords, tenderness Neuro: no focal deficits. Psych: appropriate mood and affect Skin without rash, ecchymosis, petechiae Portacath-without erythema or tenderness  Lab Results:  Results for orders placed in visit on 03/05/14  CBC WITH DIFFERENTIAL      Result Value Ref Range   WBC 4.4  3.9 - 10.3 10e3/uL   NEUT# 2.6  1.5 - 6.5 10e3/uL   HGB 8.9 (*) 11.6 - 15.9 g/dL   HCT 29.3 (*) 34.8 - 46.6 %   Platelets 186  145 - 400 10e3/uL   MCV 106.2 (*) 79.5 - 101.0 fL   MCH 32.2  25.1 - 34.0 pg   MCHC 30.4 (*) 31.5 - 36.0 g/dL   RBC 2.76 (*) 3.70 - 5.45 10e6/uL   RDW 15.3 (*) 11.2 - 14.5 %   lymph# 1.3  0.9 - 3.3 10e3/uL   MONO# 0.4  0.1 - 0.9 10e3/uL   Eosinophils Absolute 0.1  0.0 - 0.5 10e3/uL   Basophils Absolute 0.0  0.0 - 0.1 10e3/uL   NEUT% 59.3  38.4 - 76.8 %   LYMPH% 29.0  14.0 - 49.7 %   MONO% 9.5  0.0 - 14.0 %   EOS% 2.0  0.0 - 7.0 %   BASO% 0.2  0.0 - 2.0 %  COMPREHENSIVE METABOLIC PANEL (AO13)      Result Value Ref Range   Sodium 147 (*) 136 - 145 mEq/L   Potassium 4.2  3.5 - 5.1 mEq/L   Chloride 107  98 - 109 mEq/L   CO2 27  22 - 29 mEq/L   Glucose 120  70 - 140 mg/dl   BUN 13.6  7.0 - 26.0 mg/dL   Creatinine 0.8  0.6 - 1.1 mg/dL   Total Bilirubin 0.26  0.20 - 1.20 mg/dL   Alkaline Phosphatase 53  40 - 150 U/L   AST 14  5 - 34 U/L   ALT 7  0 - 55 U/L   Total Protein 6.3 (*) 6.4 - 8.3 g/dL   Albumin 3.4 (*) 3.5 - 5.0 g/dL   Calcium 10.3  8.4 - 10.4 mg/dL   Anion Gap 13 (*) 3 - 11 mEq/L  IRON  AND TIBC [facility]      Result Value Ref Range   Iron 40 (*) 41 - 142 ug/dL   TIBC 269  236 - 444 ug/dL   UIBC 229  120 - 384 ug/dL   %SAT 15 (*) 21 - 57 %  FERRITIN [facility]      Result Value Ref Range   Ferritin 130  9 - 269 ng/ml  MAGNESIUM (CC13)      Result Value Ref Range   Magnesium 1.4 (*) 1.5 - 2.5 mg/dl     Studies/Results:  No results found.  Medications: I have reviewed the patient's current medications. She continues magnesium oxide. Prescriptions  given for duragesic patches, total 75 mg. Begin Hemocyte/ ferrous fumarate or gluconate with ongoing rectal bleeding  DISCUSSION  Assessment/Plan:  1.endometrial carcinoma IA, grade 1 with no high risk features at surgery 12-2011, then recurrent to vagina 04-2013 and recently progressive in pelvis despite carboplatin/taxane thru 01-15-2014. RT in process, planned thru 04-01-14. Continue pain medication, increase laxatives, add oral iron. I have let gyn onc staff know that patient questions if she has packing still in vagina. I will see her back in ~ 2 weeks. 2.PAC in, flushed today 3.multifactoria anemia: ongoing bleeding, pelvic radiation and recent chemotherapy. Add po iron. 4.diabetes, HTN,hx nephrolithiasis, left breast biopsies   Time spent 35 min, >50% counseling and coordination of care  Gordy Levan, MD   03/05/2014, 8:44 PM

## 2014-03-05 NOTE — Telephone Encounter (Signed)
, °

## 2014-03-06 ENCOUNTER — Telehealth: Payer: Self-pay | Admitting: *Deleted

## 2014-03-06 ENCOUNTER — Ambulatory Visit
Admission: RE | Admit: 2014-03-06 | Discharge: 2014-03-06 | Disposition: A | Discharge status: Home / Self Care | Payer: Medicare Other | Source: Ambulatory Visit | Attending: Radiation Oncology | Admitting: Radiation Oncology

## 2014-03-06 LAB — IRON AND TIBC CHCC
%SAT: 13 % — AB (ref 21–57)
IRON: 34 ug/dL — AB (ref 41–142)
TIBC: 259 ug/dL (ref 236–444)
UIBC: 224 ug/dL (ref 120–384)

## 2014-03-06 LAB — FERRITIN CHCC: FERRITIN: 132 ng/mL (ref 9–269)

## 2014-03-06 NOTE — Telephone Encounter (Signed)
As noted below by Dr. [name], I informed patient about lab results. Iron level was 40. Patient is taking iron supplements. This message was left on the patient's cell phone. I instructed patient to call the office with any questions or concerns about the labs.

## 2014-03-06 NOTE — Telephone Encounter (Signed)
Message copied by Hebert Soho on Fri Mar 06, 2014  6:38 PM ------      Message from: Baruch Merl      Created: Fri Mar 06, 2014  6:24 PM                   ----- Message -----         From: Gordy Levan, MD         Sent: 03/05/2014   8:48 PM           To: Baruch Merl, RN, [place] seen and need follow up: please let her know iron is low, so hopefully the iron tablets will help, tho probably this will not be a quick improvement. ------

## 2014-03-09 ENCOUNTER — Ambulatory Visit
Admission: RE | Admit: 2014-03-09 | Discharge: 2014-03-09 | Disposition: A | Discharge status: Home / Self Care | Payer: Medicare Other | Source: Ambulatory Visit | Attending: Radiation Oncology | Admitting: Radiation Oncology

## 2014-03-10 ENCOUNTER — Telehealth: Payer: Self-pay

## 2014-03-10 ENCOUNTER — Ambulatory Visit: Payer: Medicare Other

## 2014-03-10 ENCOUNTER — Ambulatory Visit
Admission: RE | Admit: 2014-03-10 | Discharge: 2014-03-10 | Disposition: A | Discharge status: Home / Self Care | Payer: Medicare Other | Source: Ambulatory Visit | Attending: Radiation Oncology | Admitting: Radiation Oncology

## 2014-03-10 ENCOUNTER — Other Ambulatory Visit: Payer: Medicare Other

## 2014-03-10 VITALS — BP 121/63 | HR 74 | Temp 98.1°F | Ht 60.0 in | Wt 136.4 lb

## 2014-03-10 DIAGNOSIS — C541 Malignant neoplasm of endometrium: Secondary | ICD-10-CM

## 2014-03-10 NOTE — Progress Notes (Signed)
Pam Vazquez has had 12 fractions to her pelvis.  She denies pain and continues to use a fentanyl patch 75 mcg and tylenol as needed.  She reports having to urinate less often and smaller amounts.  She reports having diarrhea on Sunday and took Imodium.  She denies having any rectal/vaginal bleeding.  She reports a poor appetite and is forcing herself to eat.  Her weight is down 1 lb this week.  She denies nausea.  She reports fatigue.

## 2014-03-10 NOTE — Progress Notes (Signed)
  Radiation Oncology         [phone] ________________________________  Name: Pam Vazquez MRN: [id number]  Date: 03/10/2014  DOB: [birth year]  Weekly Radiation Therapy Management  Cancer of endometrium   Primary site: Corpus Uteri - Carcinoma   Staging method: AJCC 7th Edition   Clinical free text: TxNx M1   Clinical: Stage IVB (M1)   Summary: Stage IVB (M1)   Current Dose: 21.6 Gy     Planned Dose:  45 Gy  Narrative . . . . . . . . The patient presents for routine under treatment assessment.                                   The patient has noticed or urination to drop off recommend she force fluids. She does have a poor appetite. She denies any nausea. She denies any vaginal bleeding or rectal bleeding.                                 Set-up films were reviewed.                                 The chart was checked. Physical Findings. . .  height is 5' (1.524 m) and weight is 136 lb 6.4 oz (61.871 kg). Her temperature is 98.1 F (36.7 C). Her blood pressure is 121/63 and her pulse is 74. . Weight essentially stable.  The lungs are clear. The heart has a regular rhythm and rate. The abdomen is soft and nontender with normal bowel sounds. Impression . . . . . . . The patient is tolerating radiation. Plan . . . . . . . . . . . . Continue treatment as planned.  ________________________________   Blair Promise, PhD, MD

## 2014-03-10 NOTE — Telephone Encounter (Signed)
Pam Vazquez stated that she did pick up Miralax as directed by Dr. [name].  She had  a good BM  Wwth the Miralax.  Discussed Senokot -S as noted below.  Pam Vazquez will pick Semoko up and begin this 1 tab bid. Melissa Cross spok with Pam Vazquez and she does not have any vaginal packing. Gave appointment time for visit and lab on 03-18-14 at 1230.

## 2014-03-10 NOTE — Telephone Encounter (Signed)
Message copied by Baruch Merl on Tue Mar 10, 2014  1:47 PM ------      Message from: Gordy Levan      Created: Sat Mar 07, 2014  1:38 PM       I suggested miralax 1 capful once or twice daily or senokot S 1-2 once or twice daily as she is having constipation and using only prunes + MOM.  I forgot to write this down for her, so please review with her. She is on pain meds and hopefuly starting iron. Note she has daily RT 3:15.            I asked Melissa Cross/ [facility] Kidney to figure out if she has any packing in vagina, as this likely needs to come out if so. Please let patient know they should be in touch with her if not already.             Thanks      Cc LA, TH                               ------

## 2014-03-11 ENCOUNTER — Ambulatory Visit
Admission: RE | Admit: 2014-03-11 | Discharge: 2014-03-11 | Disposition: A | Discharge status: Home / Self Care | Payer: Medicare Other | Source: Ambulatory Visit | Attending: Radiation Oncology | Admitting: Radiation Oncology

## 2014-03-12 ENCOUNTER — Ambulatory Visit
Admission: RE | Admit: 2014-03-12 | Discharge: 2014-03-12 | Disposition: A | Discharge status: Home / Self Care | Payer: Medicare Other | Source: Ambulatory Visit | Attending: Radiation Oncology | Admitting: Radiation Oncology

## 2014-03-13 ENCOUNTER — Ambulatory Visit
Admission: RE | Admit: 2014-03-13 | Discharge: 2014-03-13 | Disposition: A | Discharge status: Home / Self Care | Payer: Medicare Other | Source: Ambulatory Visit | Attending: Radiation Oncology | Admitting: Radiation Oncology

## 2014-03-15 ENCOUNTER — Other Ambulatory Visit: Payer: Self-pay | Admitting: Oncology

## 2014-03-15 DIAGNOSIS — C541 Malignant neoplasm of endometrium: Secondary | ICD-10-CM

## 2014-03-16 ENCOUNTER — Ambulatory Visit
Admission: RE | Admit: 2014-03-16 | Discharge: 2014-03-16 | Disposition: A | Discharge status: Home / Self Care | Payer: Medicare Other | Source: Ambulatory Visit | Attending: Radiation Oncology | Admitting: Radiation Oncology

## 2014-03-17 ENCOUNTER — Ambulatory Visit
Admission: RE | Admit: 2014-03-17 | Discharge: 2014-03-17 | Disposition: A | Discharge status: Home / Self Care | Payer: Medicare Other | Source: Ambulatory Visit | Attending: Radiation Oncology | Admitting: Radiation Oncology

## 2014-03-17 VITALS — BP 133/78 | HR 76 | Temp 98.2°F | Ht 60.0 in | Wt 135.0 lb

## 2014-03-17 DIAGNOSIS — C541 Malignant neoplasm of endometrium: Secondary | ICD-10-CM

## 2014-03-17 NOTE — Progress Notes (Signed)
Pam Vazquez has had 17 fractions to her pelvis.  She denies pain.  She reports not urinating as much as she has before.  She reports taking 2 tablets of senna last night for constipation and has had 3 bowel movements today.  She did take an imodium today.  She says she feels weak and has a poor appetite.  Her weight is down 1 lb from last week.  She denies any rectal or vaginal bleeding.  She denies any skin irritation in her pelvic area.

## 2014-03-17 NOTE — Progress Notes (Signed)
  Radiation Oncology         [phone] ________________________________  Name: Pam Vazquez MRN: [id number]  Date: 03/17/2014  DOB: [birth year]  Weekly Radiation Therapy Management  Current Dose: 30.6 Gy     Planned Dose:  45 Gy  Narrative . . . . . . . . The patient presents for routine under treatment assessment.                                   The patient is without complaint except for problems alternating constipation with diarrhea. She also has significant fatigue at this time. She denies any hematuria or rectal bleeding or vaginal bleeding. She denies any significant nausea.                                 Set-up films were reviewed.                                 The chart was checked. Physical Findings. . .  height is 5' (1.524 m) and weight is 135 lb (61.236 kg). Her temperature is 98.2 F (36.8 C). Her blood pressure is 133/78 and her pulse is 76. . Weight essentially stable.  No significant changes. Impression . . . . . . . The patient is tolerating radiation. Plan . . . . . . . . . . . . Continue treatment as planned.  She will force fluids to avoid dehydration.  ________________________________   Blair Promise, PhD, MD

## 2014-03-18 ENCOUNTER — Telehealth: Payer: Self-pay | Admitting: Oncology

## 2014-03-18 ENCOUNTER — Ambulatory Visit (HOSPITAL_BASED_OUTPATIENT_CLINIC_OR_DEPARTMENT_OTHER): Payer: Medicare Other | Admitting: Oncology

## 2014-03-18 ENCOUNTER — Ambulatory Visit
Admission: RE | Admit: 2014-03-18 | Discharge: 2014-03-18 | Disposition: A | Discharge status: Home / Self Care | Payer: Medicare Other | Source: Ambulatory Visit | Attending: Radiation Oncology | Admitting: Radiation Oncology

## 2014-03-18 ENCOUNTER — Other Ambulatory Visit (HOSPITAL_BASED_OUTPATIENT_CLINIC_OR_DEPARTMENT_OTHER): Payer: Medicare Other

## 2014-03-18 ENCOUNTER — Encounter: Payer: Self-pay | Admitting: Oncology

## 2014-03-18 VITALS — BP 115/68 | HR 76 | Temp 98.2°F | Resp 18 | Ht 60.0 in | Wt 136.4 lb

## 2014-03-18 DIAGNOSIS — C549 Malignant neoplasm of corpus uteri, unspecified: Secondary | ICD-10-CM

## 2014-03-18 DIAGNOSIS — C541 Malignant neoplasm of endometrium: Secondary | ICD-10-CM

## 2014-03-18 DIAGNOSIS — D649 Anemia, unspecified: Secondary | ICD-10-CM

## 2014-03-18 DIAGNOSIS — E119 Type 2 diabetes mellitus without complications: Secondary | ICD-10-CM

## 2014-03-18 LAB — CBC WITH DIFFERENTIAL/PLATELET
BASO%: 0.3 % (ref 0.0–2.0)
BASOS ABS: 0 10*3/uL (ref 0.0–0.1)
EOS%: 3 % (ref 0.0–7.0)
Eosinophils Absolute: 0.1 10*3/uL (ref 0.0–0.5)
HEMATOCRIT: 30.4 % — AB (ref 34.8–46.6)
HEMOGLOBIN: 9.7 g/dL — AB (ref 11.6–15.9)
LYMPH#: 1.1 10*3/uL (ref 0.9–3.3)
LYMPH%: 22.8 % (ref 14.0–49.7)
MCH: 32.6 pg (ref 25.1–34.0)
MCHC: 32.1 g/dL (ref 31.5–36.0)
MCV: 101.6 fL — AB (ref 79.5–101.0)
MONO#: 0.5 10*3/uL (ref 0.1–0.9)
MONO%: 11.3 % (ref 0.0–14.0)
NEUT#: 2.9 10*3/uL (ref 1.5–6.5)
NEUT%: 62.6 % (ref 38.4–76.8)
PLATELETS: 165 10*3/uL (ref 145–400)
RBC: 2.99 10*6/uL — ABNORMAL LOW (ref 3.70–5.45)
RDW: 16 % — ABNORMAL HIGH (ref 11.2–14.5)
WBC: 4.7 10*3/uL (ref 3.9–10.3)

## 2014-03-18 LAB — BASIC METABOLIC PANEL (CC13)
Anion Gap: 9 mEq/L (ref 3–11)
BUN: 11.8 mg/dL (ref 7.0–26.0)
CO2: 27 mEq/L (ref 22–29)
CREATININE: 0.9 mg/dL (ref 0.6–1.1)
Calcium: 10.4 mg/dL (ref 8.4–10.4)
Chloride: 108 mEq/L (ref 98–109)
Glucose: 149 mg/dl — ABNORMAL HIGH (ref 70–140)
Potassium: 3.8 mEq/L (ref 3.5–5.1)
Sodium: 145 mEq/L (ref 136–145)

## 2014-03-18 LAB — MAGNESIUM (CC13): Magnesium: 1.6 mg/dl (ref 1.5–2.5)

## 2014-03-18 NOTE — Telephone Encounter (Signed)
, °

## 2014-03-18 NOTE — Progress Notes (Signed)
OFFICE PROGRESS NOTE   03/18/2014   Physicians:W.Brewster, J.Kinard, S.[name]   INTERVAL HISTORY:  Patient is seen, alone for visit, in continuing attention to endometrial carcinoma which progressed in pelvis while on taxotere carboplatin and for which she is receiving radiation now by Dr Sondra Come. Radiation is planned thru 04-01-14, then expect CT ~ 4-6 weeks later, shortly prior to next gyn oncology visit. Rectal bleeding stopped entirely ~ 10 days ago. Pain is much better, still on duragesic 75 mcg patch every 72 hrs, but needing prn pain medication now only just prior to radiation. She is tolerating oral iron without difficulty, taking this on empty stomach with OJ. Bowels are moving better, including 3 very large BMs yesterday after 2 tablets of Senokot S. She sometimes has small stools several times daily, but not frank diarrhea. She is not checking blood sugars, which I have encouraged her to do at least if she cannot eat.    PAC last flushed 03-05-14  ONCOLOGIC HISTORY #1 She had robotic assisted total laparoscopic hysterectomy bilateral salpingo-oophorectomy right pelvic lymph node biopsy on 12/19/2011. 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 There was no involvement of other organs lymphovascular invasion was not identified. 3 lymph nodes were negative for metastatic disease. 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  #2patient was seen by Dr. [name] on 04/29/2013 with a nodule in the vagina concerning for recurrence as well as 8 cm rectal mass. However the CT of the abdomen and pelvis showed a lobular mass at the vagina cuff measuring 7.0 x 3.2 cm proximally 4 cm craniocaudal dimension. The mass compressed the distal aspect of the sigmoid  colon just above the rectum there was no mass approximates the posterior wall of the bladder without clear invasion noted evidence of obstruction of stool proximal to this mass. There was also noted to be a large necrotic lymph node measuring 2.8 cm within the sigmoid mesocolon centrally. There are also small bilateral external iliac lymph nodes. There were no aggressive osseous lesions.  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  #4 patient is s/p palliative chemotherapy starting on 06/03/2013 with Taxol carboplatinum. She received 6 cycles and was re-imaged on 10/14/2013. There was an interval decrease in tumor burden therefore Dr. [name] recommended 3 additional cycles of Taxotere/Carboplatin (Ms. 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 She is s/p three cycles of this and received it from 11/27/13 through 01/15/14.  #5 Patient underwent a CT of the chest, abdomen, and pelvis on 02/10/14 and it demonstrated disease progression. She was referred by Dr. [name] to Dr. Sondra Come for radiation which started on 02/23/14.  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 Overdue to dentist, no acute problems, but also fine to do that now. Tolerating oral iron (Hemocyte). Fatigue continues, but overall more comfortable. Remainder of 10 point Review of Systems negative.  Objective:  Vital signs in last 24 hours:  BP 115/68  Pulse 76  Temp(Src) 98.2 F (36.8 C) (Oral)  Resp 18  Ht 5' (1.524 m)  Wt 136 lb 6.4 oz (61.871 kg)  BMI 26.64 kg/m2  SpO2 98% Weight stable. Alert, oriented and appropriate. Ambulatory  without assistance, favoring knee.  Wearing wig. Somewhat pale.  HEENT:PERRL, sclerae not icteric. Oral mucosa moist without lesions, posterior pharynx clear.  Neck supple. No JVD.  Lymphatics:no  cervical,suraclavicular, axillary or inguinal adenopathy Resp: clear to auscultation bilaterally and normal percussion bilaterally Cardio: regular rate and rhythm. No gallop. GI: soft, nontender, not distended, no mass or organomegaly. Some bowel sounds. Surgical incision not remarkable. Musculoskeletal/ Extremities: without pitting edema, cords. Back not tender Neuro: no change peripheral neuropathy. Otherwise nonfocal. PSYCH mood and affect better today. Skin without rash, ecchymosis, petechiae. Portacath-without erythema or tenderness  Lab Results:  Results for orders placed in visit on 03/18/14  CBC WITH DIFFERENTIAL      Result Value Ref Range   WBC 4.7  3.9 - 10.3 10e3/uL   NEUT# 2.9  1.5 - 6.5 10e3/uL   HGB 9.7 (*) 11.6 - 15.9 g/dL   HCT 30.4 (*) 34.8 - 46.6 %   Platelets 165  145 - 400 10e3/uL   MCV 101.6 (*) 79.5 - 101.0 fL   MCH 32.6  25.1 - 34.0 pg   MCHC 32.1  31.5 - 36.0 g/dL   RBC 2.99 (*) 3.70 - 5.45 10e6/uL   RDW 16.0 (*) 11.2 - 14.5 %   lymph# 1.1  0.9 - 3.3 10e3/uL   MONO# 0.5  0.1 - 0.9 10e3/uL   Eosinophils Absolute 0.1  0.0 - 0.5 10e3/uL   Basophils Absolute 0.0  0.0 - 0.1 10e3/uL   NEUT% 62.6  38.4 - 76.8 %   LYMPH% 22.8  14.0 - 49.7 %   MONO% 11.3  0.0 - 14.0 %   EOS% 3.0  0.0 - 7.0 %   BASO% 0.3  0.0 - 2.0 %  MAGNESIUM (CC13)      Result Value Ref Range   Magnesium 1.6  1.5 - 2.5 mg/dl    BMET available after visit normal with exception of glucose 149.  Studies/Results:  No results found. She is aware that we try to wait at least 4 weeks after completion of radiation before repeat scans, to tell maximal effect of those treatments.  Medications: I have reviewed the patient's current medications. Discussed laxative effects of magnesium po. Continue oral iron away from meals with vit C or OJ. She is on duragesic 75 mcg as one 50 + one 25 patch q 72 hrs. She will try decreasing to 50 mcg in next week or so when due change, then we may want to try down to  25 mcg if remains comfortable. She will let us know by phone how she is doing with these doses and we will refill as appropriate prior to my next visit.   Assessment/Plan:  1.endometrial carcinoma IA, grade 1 with no high risk features at surgery 12-2011, then recurrent to vagina 04-2013 and recently progressive in pelvis despite carboplatin/taxane thru 01-15-2014. RT in process, planned thru 04-01-14. May be able to gradually decrease duragesic as above. i will see her in June with repeat labs, she will likely see Dr Sondra Come ~ a month after completion of radiation, and she is scheduled back to Dr [name] 05-21-14. Consider repeat scans late June. 2.PAC in, flushed 03-05-14 and will need flush ~ every 6-8 weeks if not used. 3.multifactoria anemia: ongoing bleeding, pelvic radiation and recent chemotherapy. Now on po iron.  4.diabetes, HTN,hx nephrolithiasis, left breast biopsies   Patient is comfortable with discussion and plan as above. Time spent 25 min including >50% counseling and coordination of care.  Gordy Levan, MD   03/18/2014, 1:05 PM

## 2014-03-18 NOTE — Patient Instructions (Signed)
In next week or so if pain is still doing well, try decreasing patches to just the 50 patch every 3 days. If you do fine with just 50 for 3-6 days, can then decrease to 25.  Let Dr Mariana Kaufman RN know ~ May 18 how you are doing with the patches, so we know what refill you might need.

## 2014-03-19 ENCOUNTER — Telehealth: Payer: Self-pay

## 2014-03-19 ENCOUNTER — Ambulatory Visit
Admission: RE | Admit: 2014-03-19 | Discharge: 2014-03-19 | Disposition: A | Discharge status: Home / Self Care | Payer: Medicare Other | Source: Ambulatory Visit | Attending: Radiation Oncology | Admitting: Radiation Oncology

## 2014-03-19 NOTE — Telephone Encounter (Signed)
Told Ms. Ewart the results of her c-met as noted below by Dr. [name].

## 2014-03-19 NOTE — Telephone Encounter (Signed)
Message copied by Baruch Merl on Thu Mar 19, 2014  9:13 AM ------      Message from: Gordy Levan      Created: Wed Mar 18, 2014  1:52 PM       Labs seen and need follow up: can let her know blood sugar 149 today and otherwise chemistries good ------

## 2014-03-20 ENCOUNTER — Ambulatory Visit
Admission: RE | Admit: 2014-03-20 | Discharge: 2014-03-20 | Disposition: A | Discharge status: Home / Self Care | Payer: Medicare Other | Source: Ambulatory Visit | Attending: Radiation Oncology | Admitting: Radiation Oncology

## 2014-03-23 ENCOUNTER — Ambulatory Visit
Admission: RE | Admit: 2014-03-23 | Discharge: 2014-03-23 | Disposition: A | Discharge status: Home / Self Care | Payer: Medicare Other | Source: Ambulatory Visit | Attending: Radiation Oncology | Admitting: Radiation Oncology

## 2014-03-24 ENCOUNTER — Ambulatory Visit
Admission: RE | Admit: 2014-03-24 | Discharge: 2014-03-24 | Disposition: A | Discharge status: Home / Self Care | Payer: Medicare Other | Source: Ambulatory Visit | Attending: Radiation Oncology | Admitting: Radiation Oncology

## 2014-03-24 VITALS — BP 131/63 | HR 63 | Temp 98.4°F | Ht 60.0 in | Wt 132.7 lb

## 2014-03-24 DIAGNOSIS — C541 Malignant neoplasm of endometrium: Secondary | ICD-10-CM

## 2014-03-24 NOTE — Progress Notes (Signed)
  Radiation Oncology         [phone] ________________________________  Name: SHAUNTEA LOK MRN: [id number]  Date: 03/24/2014  DOB: 08-03-1946  Weekly Radiation Therapy Management  Current Dose: 39.6 Gy     Planned Dose:  45 Gy  Narrative . . . . . . . . The patient presents for routine under treatment assessment.                                   The patient is without complaint.  She is having less pain in the pelvis area has been able to reduce her Duragesic dose. She is having less problems with constipation. She denies any rectal bleeding at this time.                                 Set-up films were reviewed.                                 The chart was checked. Physical Findings. . .  height is 5' (1.524 m) and weight is 132 lb 11.2 oz (60.192 kg). Her temperature is 98.4 F (36.9 C). Her blood pressure is 125/60 and her pulse is 61. .  The lungs are clear. The heart has regular rhythm and rate. The abdomen is soft and nontender with normal bowel sounds. Impression . . . . . . . The patient is tolerating radiation. Plan . . . . . . . . . . . . Continue treatment as planned for 3 more treatments.  ________________________________   Blair Promise, PhD, MD

## 2014-03-24 NOTE — Progress Notes (Signed)
Pam Vazquez has had 22 fractions to her pelvis.  She denies pain and has reduced her fentanyl patch to 50 mcg.  She reports her bowels are moving better.  She has lost 4 lbs since 5/6.  She reports that her appetite is "not good."  She thinks her weight went down due to her bowels moving more.  She is drinking boost.  Orthostatic vitals done: bp sitting 125/60, hr 61, bp standing 131/63, hr 63.  She denies bladder issues, vaginal bleeding and skin irritation.  She reports fatigue.

## 2014-03-24 NOTE — Addendum Note (Signed)
Encounter addended by: Jacqulyn Liner, RN on: 03/24/2014  9:45 AM<BR>     Documentation filed: Vitals Section

## 2014-03-25 ENCOUNTER — Ambulatory Visit
Admission: RE | Admit: 2014-03-25 | Discharge: 2014-03-25 | Disposition: A | Discharge status: Home / Self Care | Payer: Medicare Other | Source: Ambulatory Visit | Attending: Radiation Oncology | Admitting: Radiation Oncology

## 2014-03-26 ENCOUNTER — Ambulatory Visit
Admission: RE | Admit: 2014-03-26 | Discharge: 2014-03-26 | Disposition: A | Discharge status: Home / Self Care | Payer: Medicare Other | Source: Ambulatory Visit | Attending: Radiation Oncology | Admitting: Radiation Oncology

## 2014-03-27 ENCOUNTER — Other Ambulatory Visit (HOSPITAL_BASED_OUTPATIENT_CLINIC_OR_DEPARTMENT_OTHER): Payer: Medicare Other

## 2014-03-27 ENCOUNTER — Ambulatory Visit
Admission: RE | Admit: 2014-03-27 | Discharge: 2014-03-27 | Disposition: A | Discharge status: Home / Self Care | Payer: Medicare Other | Source: Ambulatory Visit | Attending: Radiation Oncology | Admitting: Radiation Oncology

## 2014-03-27 DIAGNOSIS — C541 Malignant neoplasm of endometrium: Secondary | ICD-10-CM

## 2014-03-27 DIAGNOSIS — C549 Malignant neoplasm of corpus uteri, unspecified: Secondary | ICD-10-CM

## 2014-03-27 LAB — CBC WITH DIFFERENTIAL/PLATELET
BASO%: 0.5 % (ref 0.0–2.0)
Basophils Absolute: 0 10*3/uL (ref 0.0–0.1)
EOS%: 4.1 % (ref 0.0–7.0)
Eosinophils Absolute: 0.2 10*3/uL (ref 0.0–0.5)
HEMATOCRIT: 30.2 % — AB (ref 34.8–46.6)
HGB: 9.8 g/dL — ABNORMAL LOW (ref 11.6–15.9)
LYMPH%: 25.9 % (ref 14.0–49.7)
MCH: 32.5 pg (ref 25.1–34.0)
MCHC: 32.6 g/dL (ref 31.5–36.0)
MCV: 99.9 fL (ref 79.5–101.0)
MONO#: 0.5 10*3/uL (ref 0.1–0.9)
MONO%: 12.8 % (ref 0.0–14.0)
NEUT#: 2.3 10*3/uL (ref 1.5–6.5)
NEUT%: 56.7 % (ref 38.4–76.8)
PLATELETS: 156 10*3/uL (ref 145–400)
RBC: 3.03 10*6/uL — AB (ref 3.70–5.45)
RDW: 15.6 % — ABNORMAL HIGH (ref 11.2–14.5)
WBC: 4.1 10*3/uL (ref 3.9–10.3)
lymph#: 1.1 10*3/uL (ref 0.9–3.3)

## 2014-03-30 ENCOUNTER — Ambulatory Visit: Payer: Medicare Other

## 2014-03-31 ENCOUNTER — Ambulatory Visit: Payer: Medicare Other

## 2014-04-01 ENCOUNTER — Ambulatory Visit: Payer: Medicare Other

## 2014-04-01 ENCOUNTER — Other Ambulatory Visit: Payer: Self-pay | Admitting: Oncology

## 2014-04-01 ENCOUNTER — Other Ambulatory Visit: Payer: Self-pay | Admitting: *Deleted

## 2014-04-01 ENCOUNTER — Telehealth: Payer: Self-pay | Admitting: *Deleted

## 2014-04-01 DIAGNOSIS — C541 Malignant neoplasm of endometrium: Secondary | ICD-10-CM

## 2014-04-01 MED ORDER — HEMOCYTE 324 (106 FE) MG PO TABS: 1.0000 | ORAL_TABLET | Freq: Every day | ORAL | Status: DC

## 2014-04-01 MED ORDER — FENTANYL 25 MCG/HR TD PT72: MEDICATED_PATCH | TRANSDERMAL | Status: DC

## 2014-04-01 MED ORDER — MAGNESIUM OXIDE 400 (241.3 MG) MG PO TABS: 400.0000 mg | ORAL_TABLET | Freq: Two times a day (BID) | ORAL | Status: DC

## 2014-04-01 NOTE — Telephone Encounter (Signed)
Spoke with patient. Refilled Fentanyl- Dr [name] will give her 25 mcg patches. To start with 50 mcg  (2 patches) and then she can try to go down to 25 mcg (1 patch). Refilled Magnesium and Iron- will recheck labs at next visit. Is good with 7/9 appt with Dr [name]

## 2014-04-01 NOTE — Telephone Encounter (Signed)
Pt called to say she needs a refill on Fentanyl patch, lower dose of 50 mcg is working well. Also needs refill on magnesium tabs and iron tabs. Also wanted to know if Dr [name] wants her to see another MD prior to her 7/9 visit with Dr [name]. States Dr [name] mentioned wanting her to be seen earlier.

## 2014-04-13 ENCOUNTER — Encounter: Payer: Self-pay | Admitting: Radiation Oncology

## 2014-04-13 NOTE — Progress Notes (Signed)
  Radiation Oncology         [phone]) [phone] ________________________________  Name: Pam Vazquez MRN: [id number]  Date: 04/13/2014  DOB: 08-12-1946  End of Treatment Note  Diagnosis:   Recurrent endometrial cancer     Indication for treatment:  Pelvic pain, vaginal bleeding/vaginal discharge and constipation      Radiation treatment dates:   April 13 through may 15  Site/dose:   Pelvic mass 45 gray in 25 fractions  Beams/energy:   Intensity modulated radiation therapy, rapid arc treatment, 6 MV photons  Narrative: The patient tolerated radiation treatment relatively well.   She did have some fatigue. the patient's pelvic pain improved as well as problems with constipation. She did not experience any significant vaginal bleeding or discharge upon completion of treatment  Plan: The patient has completed radiation treatment. The patient will return to radiation oncology clinic for routine followup in one month. I advised them to call or return sooner if they have any questions or concerns related to their recovery or treatment.  -----------------------------------  Blair Promise, PhD, MD

## 2014-04-19 ENCOUNTER — Other Ambulatory Visit: Payer: Self-pay | Admitting: Oncology

## 2014-04-22 ENCOUNTER — Other Ambulatory Visit (HOSPITAL_BASED_OUTPATIENT_CLINIC_OR_DEPARTMENT_OTHER): Payer: Medicare Other

## 2014-04-22 ENCOUNTER — Ambulatory Visit (HOSPITAL_BASED_OUTPATIENT_CLINIC_OR_DEPARTMENT_OTHER): Payer: Medicare Other

## 2014-04-22 ENCOUNTER — Encounter: Payer: Self-pay | Admitting: Oncology

## 2014-04-22 ENCOUNTER — Telehealth: Payer: Self-pay | Admitting: Oncology

## 2014-04-22 ENCOUNTER — Ambulatory Visit (HOSPITAL_BASED_OUTPATIENT_CLINIC_OR_DEPARTMENT_OTHER): Payer: Medicare Other | Admitting: Oncology

## 2014-04-22 VITALS — BP 127/57 | HR 68 | Temp 98.5°F | Resp 18 | Ht 60.0 in | Wt 129.0 lb

## 2014-04-22 DIAGNOSIS — Z95828 Presence of other vascular implants and grafts: Secondary | ICD-10-CM

## 2014-04-22 DIAGNOSIS — E119 Type 2 diabetes mellitus without complications: Secondary | ICD-10-CM

## 2014-04-22 DIAGNOSIS — C549 Malignant neoplasm of corpus uteri, unspecified: Secondary | ICD-10-CM

## 2014-04-22 DIAGNOSIS — C541 Malignant neoplasm of endometrium: Secondary | ICD-10-CM

## 2014-04-22 DIAGNOSIS — D649 Anemia, unspecified: Secondary | ICD-10-CM

## 2014-04-22 DIAGNOSIS — I10 Essential (primary) hypertension: Secondary | ICD-10-CM

## 2014-04-22 LAB — CBC WITH DIFFERENTIAL/PLATELET
BASO%: 0.4 % (ref 0.0–2.0)
BASOS ABS: 0 10*3/uL (ref 0.0–0.1)
EOS%: 2.6 % (ref 0.0–7.0)
Eosinophils Absolute: 0.1 10*3/uL (ref 0.0–0.5)
HCT: 33.3 % — ABNORMAL LOW (ref 34.8–46.6)
HEMOGLOBIN: 10.9 g/dL — AB (ref 11.6–15.9)
LYMPH#: 1.1 10*3/uL (ref 0.9–3.3)
LYMPH%: 23 % (ref 14.0–49.7)
MCH: 31.7 pg (ref 25.1–34.0)
MCHC: 32.7 g/dL (ref 31.5–36.0)
MCV: 97 fL (ref 79.5–101.0)
MONO#: 0.5 10*3/uL (ref 0.1–0.9)
MONO%: 9.7 % (ref 0.0–14.0)
NEUT#: 3.2 10*3/uL (ref 1.5–6.5)
NEUT%: 64.3 % (ref 38.4–76.8)
Platelets: 160 10*3/uL (ref 145–400)
RBC: 3.43 10*6/uL — ABNORMAL LOW (ref 3.70–5.45)
RDW: 15.2 % — ABNORMAL HIGH (ref 11.2–14.5)
WBC: 4.9 10*3/uL (ref 3.9–10.3)

## 2014-04-22 LAB — COMPREHENSIVE METABOLIC PANEL (CC13)
ALBUMIN: 3.7 g/dL (ref 3.5–5.0)
ALT: 10 U/L (ref 0–55)
AST: 15 U/L (ref 5–34)
Alkaline Phosphatase: 55 U/L (ref 40–150)
Anion Gap: 10 mEq/L (ref 3–11)
BUN: 12.2 mg/dL (ref 7.0–26.0)
CALCIUM: 10.4 mg/dL (ref 8.4–10.4)
CHLORIDE: 108 meq/L (ref 98–109)
CO2: 28 meq/L (ref 22–29)
Creatinine: 0.8 mg/dL (ref 0.6–1.1)
Glucose: 120 mg/dl (ref 70–140)
POTASSIUM: 3.6 meq/L (ref 3.5–5.1)
Sodium: 146 mEq/L — ABNORMAL HIGH (ref 136–145)
TOTAL PROTEIN: 6.5 g/dL (ref 6.4–8.3)
Total Bilirubin: 0.75 mg/dL (ref 0.20–1.20)

## 2014-04-22 LAB — MAGNESIUM (CC13): MAGNESIUM: 1.7 mg/dL (ref 1.5–2.5)

## 2014-04-22 MED ORDER — FENTANYL 12 MCG/HR TD PT72: 12.5000 ug | MEDICATED_PATCH | TRANSDERMAL | Status: DC

## 2014-04-22 MED ORDER — HEPARIN SOD (PORK) LOCK FLUSH 100 UNIT/ML IV SOLN
500.0000 [IU] | Freq: Once | INTRAVENOUS | Status: AC
  Administered 2014-04-22: 500 [IU] via INTRAVENOUS
  Filled 2014-04-22: qty 5

## 2014-04-22 MED ORDER — SODIUM CHLORIDE 0.9 % IJ SOLN
10.0000 mL | INTRAMUSCULAR | Status: DC | PRN
  Administered 2014-04-22: 10 mL via INTRAVENOUS
  Filled 2014-04-22: qty 10

## 2014-04-22 NOTE — Patient Instructions (Signed)

## 2014-04-22 NOTE — Patient Instructions (Signed)
Decrease Duragesic (fentanyl) patches now to 12.5 mcg every 72 hours. We will give you 2 of these patches, to go for next 6 days to wean you off. After you finish 6 days of the 12.5 mcg patches, you can use oxycodone 5 mg once or twice a day for the next week if you feel jittery, gradually also cutting down on these pills until you are completely off of all of the narcotics.

## 2014-04-22 NOTE — Progress Notes (Signed)
OFFICE PROGRESS NOTE   04/22/2014   Physicians: Matthias Hughs (PCP [place]), W.Brewster, J.Kinard, S.[name]  INTERVAL HISTORY:  Patient is seen, alone for visit, in follow up of endometrial carcinoma, having completed radiation by Dr Sondra Come to recurrent disease in pelvis which had progressed on taxotere and carboplatin previously.The radiation completed 03-27-14, with resolution of rectal bleeding early in the RT course. She is to see Dr Sondra Come for one month follow up next week, and will see Dr [name] in early July; I have ordered CT AP shortly prior to Dr Leone Brand appointment.  Patient saw PCP Dr Matthias Hughs in [place] last week; he has not received my notes as I did not have him listed when I took over her care in 02-2014, however I will be sure to send these to him now. She is on 6 month follow up with Dr Scotty Court and will restart Vitamin D at his recommendation.  She has felt progressively better since completing radiation, with energy good enough to do some weeding and mowing this week. Appetite is gradually improving and she enjoyed squash from garden yesterday. Bowels are moving well daily. She denies any pain at all. She continues duragesic patches at 25 mcg every 72 hours, notices that she is restless and jittery if she delays new patch, consistent with withdrawal symptoms. We have discussed need to taper her down and off narcotics now; [facility] pharmacist has also discussed during this visit. She has minimal tingling in toes only now.      ONCOLOGIC HISTORY #1 She had robotic assisted total laparoscopic hysterectomy bilateral salpingo-oophorectomy right pelvic lymph node biopsy on 12/19/2011. 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 There was no involvement of other organs lymphovascular invasion was not identified. 3 lymph nodes were negative for metastatic disease. Her final pathologic  diagnosis was stage IA, grade 1, endometrioid endometrial carcinoma without lymphovascular invasion, 5/16 mm (31%) of myometrial invasion and negative for lymph nodes.  #2patient was seen by Dr. [name] on 04/29/2013 with a nodule in the vagina concerning for recurrence as well as 8 cm rectal mass. However the CT of the abdomen and pelvis showed a lobular mass at the vagina cuff measuring 7.0 x 3.2 cm proximally 4 cm craniocaudal dimension. The mass compressed the distal aspect of the sigmoid colon just above the rectum there was no mass approximates the posterior wall of the bladder without clear invasion noted evidence of obstruction of stool proximal to this mass. There was also noted to be a large necrotic lymph node measuring 2.8 cm within the sigmoid mesocolon centrally. There are also small bilateral external iliac lymph nodes. There were no aggressive osseous lesions.  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  #4 patient is s/p palliative chemotherapy starting on 06/03/2013 with Taxol carboplatinum. She received 6 cycles and was re-imaged on 10/14/2013. There was an interval decrease in tumor burden therefore Dr. 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 She is s/p three cycles of this and received it from 11/27/13 through 01/15/14.  #5 Patient underwent a CT of the chest, abdomen, and pelvis on 02/10/14 and it demonstrated disease progression. She was referred by Dr. [name] to Dr. Sondra Come for radiation, [place] given from 02/23/14 thru 03-27-14.  Review of systems as above, also: No fever or symptoms of infection. No problems with  PAC. No SOB or cough. No LE swelling. Bladder ok. Remainder of 10 point Review of Systems negative.  Objective:  Vital signs in last 24 hours:  BP 127/57  Pulse 68  Temp(Src) 98.5 F (36.9 C) (Oral)  Resp 18  Ht 5' (1.524  m)  Wt 129 lb (58.514 kg)  BMI 25.19 kg/m2  SpO2 100% weight is down 8 lbs.  Alert, oriented and appropriate. Ambulatory without difficulty. Looks much better overall than when I saw her last  HEENT:PERRL, sclerae not icteric. Oral mucosa moist without lesions, posterior pharynx clear.  Neck supple. No JVD.  Lymphatics:no cervical,suraclavicular, axillary or inguinal adenopathy Resp: clear to auscultation bilaterally and normal percussion bilaterally Cardio: regular rate and rhythm. No gallop. GI: soft, nontender, not distended, no mass or organomegaly. Normally active bowel sounds. Surgical incision not remarkable. Musculoskeletal/ Extremities: without pitting edema, cords, tenderness Neuro: no significant peripheral neuropathy. Otherwise nonfocal Skin without rash, ecchymosis, petechiae Portacath-without erythema or tenderness  Lab Results:  Results for orders placed in visit on 04/22/14  CBC WITH DIFFERENTIAL      Result Value Ref Range   WBC 4.9  3.9 - 10.3 10e3/uL   NEUT# 3.2  1.5 - 6.5 10e3/uL   HGB 10.9 (*) 11.6 - 15.9 g/dL   HCT 33.3 (*) 34.8 - 46.6 %   Platelets 160  145 - 400 10e3/uL   MCV 97.0  79.5 - 101.0 fL   MCH 31.7  25.1 - 34.0 pg   MCHC 32.7  31.5 - 36.0 g/dL   RBC 3.43 (*) 3.70 - 5.45 10e6/uL   RDW 15.2 (*) 11.2 - 14.5 %   lymph# 1.1  0.9 - 3.3 10e3/uL   MONO# 0.5  0.1 - 0.9 10e3/uL   Eosinophils Absolute 0.1  0.0 - 0.5 10e3/uL   Basophils Absolute 0.0  0.0 - 0.1 10e3/uL   NEUT% 64.3  38.4 - 76.8 %   LYMPH% 23.0  14.0 - 49.7 %   MONO% 9.7  0.0 - 14.0 %   EOS% 2.6  0.0 - 7.0 %   BASO% 0.4  0.0 - 2.0 %  COMPREHENSIVE METABOLIC PANEL (GU44)      Result Value Ref Range   Sodium 146 (*) 136 - 145 mEq/L   Potassium 3.6  3.5 - 5.1 mEq/L   Chloride 108  98 - 109 mEq/L   CO2 28  22 - 29 mEq/L   Glucose 120  70 - 140 mg/dl   BUN 12.2  7.0 - 26.0 mg/dL   Creatinine 0.8  0.6 - 1.1 mg/dL   Total Bilirubin 0.75  0.20 - 1.20 mg/dL   Alkaline Phosphatase 55  40  - 150 U/L   AST 15  5 - 34 U/L   ALT 10  0 - 55 U/L   Total Protein 6.5  6.4 - 8.3 g/dL   Albumin 3.7  3.5 - 5.0 g/dL   Calcium 10.4  8.4 - 10.4 mg/dL   Anion Gap 10  3 - 11 mEq/L  MAGNESIUM (CC13)      Result Value Ref Range   Magnesium 1.7  1.5 - 2.5 mg/dl     Studies/Results:  She is for mammograms at [facility] upcoming, already scheduled  Medications: I have reviewed the patient's current medications. She will change to duragesic 12.5 mcg patch every 3 days starting now, for total 6 days then will DC the duragesic. She can use oxycodone 5 mg once or twice daily for the next week after  stopping duragesic if feels jittery, but should taper this down during the week, then DC regular use of narcotics entirely.  Continue oral iron.  DISCUSSION: taper down and off narcotics as above, call if any questions or concerns during this.   Assessment/Plan:   1.endometrial carcinoma IA, grade 1 with no high risk features at surgery 12-2011, then recurrent to vagina 04-2013 and progressive in pelvis despite carboplatin/taxane thru 01-15-2014. RT completed 03-27-14, clinically improved. Probably does not need any regular narcotics now, and is having some withdrawal symptoms, so will try to taper and DC as above. Follow up with Dr Sondra Come 04-27-14. CT AP early July, then to see Dr [name] 05-21-14. 2.PAC in, flushed today and will need flush ~ every 6-8 weeks if not used. This should be used for CT in early July.  3.multifactoria anemia: ongoing bleeding, pelvic radiation and recent chemotherapy. Now on po iron and hemoglobin improving. Follow on the po iron. 4.diabetes, HTN,hx nephrolithiasis, left breast biopsies  I will see her back coordinating with PAC flush in late August, or sooner if needed. Patient understood discussion and instructions and is in agreement with plan. Time spent 30 min including >50% counseling and coordination of care  LIVESAY,LENNIS P, MD   04/22/2014, 11:27 AM

## 2014-04-22 NOTE — Telephone Encounter (Signed)
, °

## 2014-04-23 ENCOUNTER — Other Ambulatory Visit: Payer: Self-pay | Admitting: Oncology

## 2014-04-24 ENCOUNTER — Encounter: Payer: Self-pay | Admitting: Oncology

## 2014-04-27 ENCOUNTER — Encounter: Payer: Self-pay | Admitting: Radiation Oncology

## 2014-04-27 ENCOUNTER — Ambulatory Visit
Admission: RE | Admit: 2014-04-27 | Discharge: 2014-04-27 | Disposition: A | Discharge status: Home / Self Care | Payer: Medicare Other | Source: Ambulatory Visit | Attending: Radiation Oncology | Admitting: Radiation Oncology

## 2014-04-27 VITALS — BP 144/82 | HR 71 | Temp 98.1°F | Resp 16 | Ht 60.0 in | Wt 128.8 lb

## 2014-04-27 DIAGNOSIS — C541 Malignant neoplasm of endometrium: Secondary | ICD-10-CM

## 2014-04-27 NOTE — Progress Notes (Signed)
Pam Vazquez here for follow up after treatment to her pelvis.  She is having pain in her right foot.  She said the pain started Friday after she was out in her garden.  Her right foot is edematous with a red area on the side of her great toe joint.  She is having trouble walking due to the pain and is using a cane.  She denies any pelvic pain, nausea, vaginal/rectal bleeding, fatigue and skin irritation.  She has lost 4 lbs since she finished treatment.  She reports that foods do not taste good to her.

## 2014-04-27 NOTE — Progress Notes (Signed)
Radiation Oncology         [phone] ________________________________  Name: Pam Vazquez MRN: [id number]  Date: 04/27/2014  DOB: May 28, 1946  Follow-Up Visit Note  CC: [name], MD  [name], MD  Diagnosis:   Recurrent endometrial cancer  Interval Since Last Radiation:  1  months  Narrative:  The patient returns today for routine follow-up.  Overall she's had good improvement in her pelvic pain. Her vaginal bleeding has also subsided.  She is also having less problems with constipation. Over the weekend the patient developed significance pain in her right great toe area. She has had to resort to using a cane and wheelchair in light of her pain severity.  She denies any chills or fever.                            ALLERGIES:  has No Known Allergies.  Meds: Current Outpatient Prescriptions  Medication Sig Dispense Refill  . amLODipine (NORVASC) 2.5 MG tablet Take 2.5 mg by mouth every morning.       . Cholecalciferol (VITAMIN D-3) 5000 UNITS TABS Take by mouth.      . cyanocobalamin 2000 MCG tablet Take 2,000 mcg by mouth 2 (two) times daily.      . fentaNYL (DURAGESIC - DOSED MCG/HR) 12 MCG/HR Place 1 patch (12.5 mcg total) onto the skin every 3 (three) days. As directed  2 patch  0  . ferrous sulfate 325 (65 FE) MG tablet Take 325 mg by mouth daily with breakfast.      . gabapentin (NEURONTIN) 100 MG capsule TAKE TWO CAPSULES THREE TIMES DAILY  180 capsule  7  . lidocaine-prilocaine (EMLA) cream Apply topically as needed.  30 g  6  . loratadine (CLARITIN) 10 MG tablet Take 10 mg by mouth daily. Take as needed      . LORazepam (ATIVAN) 0.5 MG tablet Take 1 tablet (0.5 mg total) by mouth every 6 (six) hours as needed (Nausea or vomiting).  30 tablet  0  . magnesium oxide (MAG-OX) 400 (241.3 MG) MG tablet Take 1 tablet (400 mg total) by mouth 2 (two) times daily.  60 tablet  0  . Melatonin 5 MG CAPS Take 5 mg by mouth at bedtime as needed.      . metoprolol tartrate  (LOPRESSOR) 25 MG tablet Take 25 mg by mouth 2 (two) times daily.       . Multiple Vitamin (MULTIVITAMIN WITH MINERALS) TABS Take 1 tablet by mouth every morning. She takes Dance movement psychotherapist.      . metFORMIN (GLUCOPHAGE) 500 MG tablet Take 500 mg by mouth 2 (two) times daily.       . ondansetron (ZOFRAN) 8 MG tablet Take 1 tablet (8 mg total) by mouth 2 (two) times daily. Take two times a day starting the day after chemo for 3 days. Then take two times a day as needed for nausea or vomiting.  30 tablet  1  . oxyCODONE (OXY IR/ROXICODONE) 5 MG immediate release tablet Take 5 mg by mouth every 4 (four) hours as needed for severe pain.      [name] prochlorperazine (COMPAZINE) 10 MG tablet Take 1 tablet (10 mg total) by mouth every 6 (six) hours as needed (Nausea or vomiting).  30 tablet  1   No current facility-administered medications for this encounter.    Physical Findings: The patient is in no acute distress. Patient is alert and oriented.  height is 5' (1.524 m) and weight is 128 lb 12.8 oz (58.423 kg). Her oral temperature is 98.1 F (36.7 C). Her blood pressure is 144/82 and her pulse is 71. Her respiration is 16. .  The lungs are clear. The heart has a regular rhythm and rate. The abdomen is soft and nontender with normal bowel sounds. Examination of the right foot reveals erythema and swelling to the MP joint of the great toe.  Lab Findings: Lab Results  Component Value Date   WBC 4.9 04/22/2014   HGB 10.9* 04/22/2014   HCT 33.3* 04/22/2014   MCV 97.0 04/22/2014   PLT 160 04/22/2014      Radiographic Findings: No results found.  Impression:  The patient is recovering from the effects of radiation.  She appears to have good palliation of her symptoms.  I discussed the patient's right great toe symptoms with her primary care physician Dr. Scotty Court. Clinical impression is the patient has gout. Patient will have colchicine and prednisone called in to her local pharmacy by Dr. Scotty Court.  Plan:   Patient will followup with Dr. Scotty Court later this week concerning her right great toe. When necessary followup in radiation oncology concerning her most recent pelvic radiation treatment. She will continue close followup with gynecologic oncology and medical oncology.  ____________________________________ Blair Promise, MD

## 2014-04-30 ENCOUNTER — Telehealth: Payer: Self-pay

## 2014-04-30 NOTE — Telephone Encounter (Signed)
**Note De-Identified Daylyn Christine Obfuscation** Rcvd fax from [place] 04/29/14 Dr. Marcelo Baldy - mammogram.  Copy to Dr. [name].  Original to scan.

## 2014-05-12 ENCOUNTER — Telehealth: Payer: Self-pay | Admitting: Dietician

## 2014-05-12 NOTE — Telephone Encounter (Signed)
Brief Outpatient Oncology Nutrition Note  Patient has been identified to be at risk on malnutrition screen.  Wt Readings from Last 10 Encounters:  04/27/14 128 lb 12.8 oz (58.423 kg)  04/22/14 129 lb (58.514 kg)  03/24/14 132 lb 11.2 oz (60.192 kg)  03/18/14 136 lb 6.4 oz (61.871 kg)  03/17/14 135 lb (61.236 kg)  03/10/14 136 lb 6.4 oz (61.871 kg)  03/05/14 137 lb 1.6 oz (62.188 kg)  03/03/14 137 lb (62.143 kg)  02/24/14 136 lb 12.8 oz (62.052 kg)  02/24/14 136 lb 6.4 oz (61.871 kg)      Dx:  Endometrial Cancer.  Patient of Dr. [name].  Called patient regarding weight loss.  Patient had been followed by the [facility] RD up until 09/23/13.  Her weight was stable at that time at 152.6 lbs.  Since that time patient has lost 24 lbs with patient reporting a UBW of 180 lbs 15 months ago.    Patient states that her appetite and intake are gradually improving.  She has not questions or needs at this time.  Encouraged patient to call for any nutrition related questions.  Antonieta Iba, RD, LDN

## 2014-05-19 ENCOUNTER — Ambulatory Visit (HOSPITAL_BASED_OUTPATIENT_CLINIC_OR_DEPARTMENT_OTHER): Payer: Medicare Other

## 2014-05-19 ENCOUNTER — Ambulatory Visit (HOSPITAL_COMMUNITY)
Admission: RE | Admit: 2014-05-19 | Discharge: 2014-05-19 | Disposition: A | Discharge status: Home / Self Care | Payer: Medicare Other | Source: Ambulatory Visit | Attending: Oncology | Admitting: Oncology

## 2014-05-19 VITALS — BP 139/69 | HR 68 | Temp 97.3°F

## 2014-05-19 DIAGNOSIS — Z452 Encounter for adjustment and management of vascular access device: Secondary | ICD-10-CM

## 2014-05-19 DIAGNOSIS — M799 Soft tissue disorder, unspecified: Secondary | ICD-10-CM | POA: Insufficient documentation

## 2014-05-19 DIAGNOSIS — C549 Malignant neoplasm of corpus uteri, unspecified: Secondary | ICD-10-CM | POA: Insufficient documentation

## 2014-05-19 DIAGNOSIS — N289 Disorder of kidney and ureter, unspecified: Secondary | ICD-10-CM | POA: Insufficient documentation

## 2014-05-19 DIAGNOSIS — C541 Malignant neoplasm of endometrium: Secondary | ICD-10-CM

## 2014-05-19 DIAGNOSIS — Z95828 Presence of other vascular implants and grafts: Secondary | ICD-10-CM

## 2014-05-19 MED ORDER — SODIUM CHLORIDE 0.9 % IJ SOLN
10.0000 mL | INTRAMUSCULAR | Status: DC | PRN
  Administered 2014-05-19: 10 mL via INTRAVENOUS
  Filled 2014-05-19: qty 10

## 2014-05-19 MED ORDER — IOHEXOL 300 MG/ML  SOLN
100.0000 mL | Freq: Once | INTRAMUSCULAR | Status: AC | PRN
  Administered 2014-05-19: 100 mL via INTRAVENOUS

## 2014-05-19 MED ORDER — HEPARIN SOD (PORK) LOCK FLUSH 100 UNIT/ML IV SOLN
500.0000 [IU] | Freq: Once | INTRAVENOUS | Status: AC
  Administered 2014-05-19: 500 [IU] via INTRAVENOUS
  Filled 2014-05-19: qty 5

## 2014-05-19 NOTE — Patient Instructions (Signed)

## 2014-05-21 ENCOUNTER — Encounter: Payer: Self-pay | Admitting: Gynecologic Oncology

## 2014-05-21 ENCOUNTER — Ambulatory Visit: Payer: Medicare Other | Attending: Gynecologic Oncology | Admitting: Gynecologic Oncology

## 2014-05-21 VITALS — BP 127/68 | HR 62 | Temp 98.5°F | Resp 18 | Ht 60.0 in | Wt 131.1 lb

## 2014-05-21 DIAGNOSIS — E119 Type 2 diabetes mellitus without complications: Secondary | ICD-10-CM | POA: Insufficient documentation

## 2014-05-21 DIAGNOSIS — C541 Malignant neoplasm of endometrium: Secondary | ICD-10-CM

## 2014-05-21 DIAGNOSIS — C549 Malignant neoplasm of corpus uteri, unspecified: Secondary | ICD-10-CM | POA: Insufficient documentation

## 2014-05-21 DIAGNOSIS — I10 Essential (primary) hypertension: Secondary | ICD-10-CM | POA: Insufficient documentation

## 2014-05-21 DIAGNOSIS — Z9851 Tubal ligation status: Secondary | ICD-10-CM | POA: Insufficient documentation

## 2014-05-21 NOTE — Patient Instructions (Signed)
Follow up in Sept with Dr. [name] to go over PET scan results

## 2014-05-21 NOTE — Progress Notes (Signed)
Office Visit: GYN ONCOLOGY   Chief Complaint: Recurrent  endometrial cancer   Assessment:   68 y.o. . 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 Her postop course has been complicated by persistent granulation tissue. 04/2013 vaginal examination is notable for a nodule in the vagina this was biopsied. A rectal examination firm 8 cm mass was noted. Biopsy c/w recurrent endometroid cancer. Ms Pam Vazquez has received 6 cycles taxol/carbo. Repeat CT 4 weeks after cycle 6 notabkle for interval decrease in tumor burden. She has received 3 additional cycles of Taxol/Carbolast dose 01/2014. Physical examination and imaging 01/2014 confirm disease progression of rectovaginal mass and pelvic LN.  Pelvic radiotherapy recommended  Patient was advised that the success rate of chemotherapy of agents other than taxane/platin is very low. She completed pelvic XRT 03/2014.  Imaging 05/2014 shows disease reduction. Physical examination is notable for a dense fibrotic induration at the vaginal apex. I am unsure it the residual lesions are metabolically active. Will collect PET in September 2015.  If the recurrence is not controlled with radiotherapy her outcome is not favourable   HPI: Pam Vazquez is a 68 y.o. initially seen in consultation on 11/27/2011 at the request of Dr. Melinda Crutch for evaluation of a grade 1 endometrial adenocarcinoma.. She then underwent a robotic-assisted total laparoscopic hysterectomy bilateral salpingo-oophorectomy right pelvic lymph node biopsy on 69/62/9528 without complications. Her final pathologic diagnosis is a Stage Ia Grade 1 endometrioid endometrial cancer with no evidence of lymphovascular space invasion, 5/16 mm (31 %) of myometrial invasion and negative lymph nodes.   [name] presented to Dr. Delene Loll in October of 2013. A Pap test at that time was within normal limits    . At a visit 04/29/2013 with Gyn Onc granulation tissue was noted  and a pevlic mass was appreciated and biopsied.  Diagnosis  Vagina, biopsy, apex  - SLIGHTLY INFLAMED SQUAMOUS EPITHELIUM. - NO EVIDENCE OF DYSPLASIA OR MALIGNANCY.  CT 05/05/2013  1. Lobular mass at the vaginal cuff is consistent with endometrial carcinoma progression. 2. Mass extends to the serosal surface of the sigmoid colon and compresses the lumen of the sigmoid colon. No obstruction at this time.  3. Mass approximates the posterior wall of the bladder without clear invasion. 4. Large necrotic lymph node within the sigmoid mesocolon superior to the vaginal cuff. 5. Small bilateral iliac lymph nodes  CT guided bx 05/2013 Pelvis, biopsy, Central lower  - METASTATIC CARCINOMA WITH ASSOCIATED TUMOR NECROSIS.   CT scan after 6 cycles showed tumor reduction.  Pam Vazquez has received 9 cycles taxol/Carboplatin last cycle 01/2014.  CT 02/10/2014  IMPRESSION:  1. Progressive multifocal tumor recurrence in the pelvis. Dominant mass involves the vaginal cuff, and there are enlarged pelvic lymph nodes and mesenteric implants as described. One of these appears to be involving the sigmoid colon. While there is no evidence of high-grade bowel obstruction at this time, this does place the patient at risk for bowel obstruction.  2. No ascites or generalized peritoneal nodularity. No evidence of hematogenous metastatic disease.   Has received pelvic XRT Radiation treatment dates: April 13 through May 15  Site/dose: Pelvic mass 45 gray in 25 fractions  Beams/energy: Intensity modulated radiation therapy, rapid arc treatment, 6 MV photons  CT 05/19/2014 In the pelvis the soft tissue masses have decreased in size since the prior CT. The lesion adjacent to the psoas muscle on image number 59 measures 24 x 18 mm and  previously measured 34 x 26 mm. The lesion adjacent to the mid sigmoid colon on image number 60 measures 23 x 23 mm and previously measured 24 x 22 mm. The lesion associated with the left vaginal  cuff previously measured 40 x 32 mm and now measures 32 x 25 mm. No new lesions. The bladder is unremarkable. No free pelvic fluid collections. No inguinal mass or adenopathy.   PMH  Past Medical History      Diagnosis    Date   .  Diabetes mellitus     .  PONV (postoperative nausea and vomiting)     .  Cancer       endometrial    .  Angina       stress test on chart from 3/12/ none since March 2012    .  Chronic kidney disease       kidney stones    .  Hypertension       / EKG 1/13 Epic     Past Surgical History       Procedure    Laterality  Date   .  Hysteroscopy w/d&c    11/21/2011     Procedure: DILATATION AND CURETTAGE /HYSTEROSCOPY; Surgeon: Gus Height; Location: [facility]; Service: Gynecology; Laterality: N/A;     .  Tubal ligation      .  Breast mass removal    01-Mar-1983, 1992     L breast     .  Lithotripsy    2000, 2002   .  Back surgery        1990-02-28 and 02-29-00     .  Dilation and curettage of uterus      .  Node dissection    12/19/2011     Procedure: NODE DISSECTION; Surgeon: [name], MD PHD; Location: [facility]; Service: Gynecology; Laterality: N/A;     Social Hx. her husband died in 2013-02-28.   Review of systems:  Constitutional: Fair appetite  Ears, Nose, Mouth, Throat: No dizziness, headaches or changes in hearing.  Cardiovascular: No chest pain, palpitations or edema.  Respiratory: No shortness of breath, wheezing, intermittent  cough  Gastrointestinal: She has normal bowel movements without diarrhea or constipation. She denies any nausea or vomiting. No constipation or diarrhea Genitourinary: She denies pelvic pain, pelvic pressure or changes in her urinary function. She has no hematuria, dysuria, or incontinence. Neurological: Denies dizziness or headaches. No neuropathy, no numbness or tingling.  Hematologic/Lymphatic: Vaginal bleeding   Physical Exam:  General: Well dressed, well nourished in no apparent distress.  Chest:  CTA Abdomen: Soft, nontender, nondistended.  No palpable masses. No hepatosplenomegaly. No ascites. Normal bowel sounds. No hernias or tenderness at the laparoscopic trocar sites.  Genitourinary: Normal EGBUS Vaginal cuff with separation at the apex. Firm indurated mass at the cuff.  Trace bleeding on examination Rectal: cul de sac firm indurated area Extremities: No cyanosis, clubbing or edema. No calf tenderness or erythema. No palpable cords.  ke, alert and oriented x 3. Sensation is intact,  Musculoskeletal: No pain, normal strength and range of motion. LN:  No cervical supra-clavicular or inguinal adenopathy

## 2014-07-05 ENCOUNTER — Other Ambulatory Visit: Payer: Self-pay | Admitting: Oncology

## 2014-07-05 DIAGNOSIS — C541 Malignant neoplasm of endometrium: Secondary | ICD-10-CM

## 2014-07-08 ENCOUNTER — Ambulatory Visit (HOSPITAL_BASED_OUTPATIENT_CLINIC_OR_DEPARTMENT_OTHER): Payer: Medicare Other | Admitting: Oncology

## 2014-07-08 ENCOUNTER — Telehealth: Payer: Self-pay | Admitting: Oncology

## 2014-07-08 ENCOUNTER — Encounter: Payer: Self-pay | Admitting: Oncology

## 2014-07-08 ENCOUNTER — Ambulatory Visit (HOSPITAL_BASED_OUTPATIENT_CLINIC_OR_DEPARTMENT_OTHER): Payer: Medicare Other

## 2014-07-08 ENCOUNTER — Other Ambulatory Visit (HOSPITAL_BASED_OUTPATIENT_CLINIC_OR_DEPARTMENT_OTHER): Payer: Medicare Other

## 2014-07-08 VITALS — BP 121/65 | HR 78 | Temp 98.1°F | Resp 18 | Ht 60.0 in | Wt 127.8 lb

## 2014-07-08 DIAGNOSIS — E119 Type 2 diabetes mellitus without complications: Secondary | ICD-10-CM

## 2014-07-08 DIAGNOSIS — C549 Malignant neoplasm of corpus uteri, unspecified: Secondary | ICD-10-CM

## 2014-07-08 DIAGNOSIS — C541 Malignant neoplasm of endometrium: Secondary | ICD-10-CM

## 2014-07-08 DIAGNOSIS — E876 Hypokalemia: Secondary | ICD-10-CM

## 2014-07-08 DIAGNOSIS — I10 Essential (primary) hypertension: Secondary | ICD-10-CM

## 2014-07-08 DIAGNOSIS — Z95828 Presence of other vascular implants and grafts: Secondary | ICD-10-CM

## 2014-07-08 LAB — COMPREHENSIVE METABOLIC PANEL (CC13)
ALT: 11 U/L (ref 0–55)
AST: 17 U/L (ref 5–34)
Albumin: 3.8 g/dL (ref 3.5–5.0)
Alkaline Phosphatase: 73 U/L (ref 40–150)
Anion Gap: 11 mEq/L (ref 3–11)
BILIRUBIN TOTAL: 0.69 mg/dL (ref 0.20–1.20)
BUN: 13 mg/dL (ref 7.0–26.0)
CALCIUM: 10.9 mg/dL — AB (ref 8.4–10.4)
CHLORIDE: 105 meq/L (ref 98–109)
CO2: 27 mEq/L (ref 22–29)
CREATININE: 0.9 mg/dL (ref 0.6–1.1)
Glucose: 227 mg/dl — ABNORMAL HIGH (ref 70–140)
Potassium: 3.3 mEq/L — ABNORMAL LOW (ref 3.5–5.1)
Sodium: 143 mEq/L (ref 136–145)
Total Protein: 7 g/dL (ref 6.4–8.3)

## 2014-07-08 LAB — CBC WITH DIFFERENTIAL/PLATELET
BASO%: 0.1 % (ref 0.0–2.0)
BASOS ABS: 0 10*3/uL (ref 0.0–0.1)
EOS%: 1.1 % (ref 0.0–7.0)
Eosinophils Absolute: 0.1 10*3/uL (ref 0.0–0.5)
HEMATOCRIT: 36.6 % (ref 34.8–46.6)
HEMOGLOBIN: 12.1 g/dL (ref 11.6–15.9)
LYMPH#: 0.9 10*3/uL (ref 0.9–3.3)
LYMPH%: 11.4 % — AB (ref 14.0–49.7)
MCH: 31.8 pg (ref 25.1–34.0)
MCHC: 33.1 g/dL (ref 31.5–36.0)
MCV: 96.1 fL (ref 79.5–101.0)
MONO#: 0.3 10*3/uL (ref 0.1–0.9)
MONO%: 4.5 % (ref 0.0–14.0)
NEUT#: 6.3 10*3/uL (ref 1.5–6.5)
NEUT%: 82.9 % — AB (ref 38.4–76.8)
PLATELETS: 200 10*3/uL (ref 145–400)
RBC: 3.81 10*6/uL (ref 3.70–5.45)
RDW: 13.7 % (ref 11.2–14.5)
WBC: 7.5 10*3/uL (ref 3.9–10.3)

## 2014-07-08 MED ORDER — HEPARIN SOD (PORK) LOCK FLUSH 100 UNIT/ML IV SOLN
500.0000 [IU] | Freq: Once | INTRAVENOUS | Status: AC
  Administered 2014-07-08: 500 [IU] via INTRAVENOUS
  Filled 2014-07-08: qty 5

## 2014-07-08 MED ORDER — SODIUM CHLORIDE 0.9 % IJ SOLN
10.0000 mL | INTRAMUSCULAR | Status: DC | PRN
  Administered 2014-07-08: 10 mL via INTRAVENOUS
  Filled 2014-07-08: qty 10

## 2014-07-08 MED ORDER — OXYCODONE HCL 5 MG PO TABS: 5.0000 mg | ORAL_TABLET | ORAL | Status: DC | PRN

## 2014-07-08 NOTE — Progress Notes (Signed)
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 Last CT AP 05-19-14 showed decrease in size of pelvic masses, tho still 3 areas adjacent to psoas, adjacent to mid sigmoid colon and at vaginal apex.She saw Dr [name] shortly after that CT, exam remarkable for dense fibrotic induration at vaginal apex,  with plan for PET and follow up with Dr [name] in late Sept.  Patient has had increased pain in low abdomen and low back for ~ 2 weeks, with some difficulty moving bowels. She uses miralax once daily and did feel better after bowels moved several times earlier this week. She is taking oxycodone 5 mg only once or twice daily. She is hardly sleeping, despite melatonin, did sleep with Remeron 15 mg from Dr Scotty Court, whom she saw shortly prior to start of this discomfort. She is eating, does not have nausea or vomiting.   She has PAC, flushed today. Her daughter is to be married at patient's home on 07-20-14.  ONCOLOGIC HISTORY #1 She had robotic assisted total laparoscopic hysterectomy bilateral salpingo-oophorectomy right pelvic lymph node biopsy on 12/19/2011. 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 There was no involvement of other organs lymphovascular invasion was not identified. 3 lymph nodes were negative for metastatic disease. 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  #2patient was seen by Dr. [name] on 04/29/2013 with a nodule in the vagina concerning for  recurrence as well as 8 cm rectal mass. However the CT of the abdomen and pelvis showed a lobular mass at the vagina cuff measuring 7.0 x 3.2 cm proximally 4 cm craniocaudal dimension. The mass compressed the distal aspect of the sigmoid colon just above the rectum there was no mass approximates the posterior wall of the bladder without clear invasion noted evidence of obstruction of stool proximal to this mass. There was also noted to be a large necrotic lymph node measuring 2.8 cm within the sigmoid mesocolon centrally. There are also small bilateral external iliac lymph nodes. There were no aggressive osseous lesions.  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  #4 patient is s/p palliative chemotherapy starting on 06/03/2013 with Taxol carboplatinum. She received 6 cycles and was re-imaged on 10/14/2013. There was an interval decrease in tumor burden therefore Dr. 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 She is s/p three cycles of this and received it from 11/27/13 through 01/15/14.  #5 Patient underwent a CT of the chest, abdomen, and pelvis on 02/10/14 and it demonstrated disease progression, at which point she received radiation by Dr Sondra Come,  [place] given from 02/23/14 thru 03-27-14.  Review of systems as above, also: No fever or symptoms of infection. No SOB or cough. No chest pain. No problems with PAC. No LE swelling.No bleeding. Remainder of 10 point Review of Systems negative.  Objective:  Vital signs in last 24 hours:  BP 121/65  Pulse 78  Temp(Src) 98.1 F (36.7 C) (Oral)  Resp 18  Ht  5' (1.524 m)  Wt 127 lb 12.8 oz (57.97 kg)  BMI 24.96 kg/m2 Weight is down 3 lbs from July. Very pleasant as always, NAD. Alert, oriented and appropriate. Ambulatory without difficulty.  No alopecia  HEENT:PERRL, sclerae not icteric. Oral mucosa moist  without lesions, posterior pharynx clear.  Neck supple. No JVD.  Lymphatics:no cervical,suraclavicular, axillary or inguinal adenopathy Resp: clear to auscultation bilaterally and normal percussion bilaterally Cardio: regular rate and rhythm. No gallop. GI: soft, nontender, not distended, no mass or organomegaly. Good bowel sounds. Surgical incision not remarkable. Musculoskeletal/ Extremities: without pitting edema, cords, tenderness Neuro: no peripheral neuropathy. Otherwise nonfocal. PSYCH normal mood and affect Skin without rash, ecchymosis, petechiae Portacath-without erythema or tenderness  Lab Results:  Results for orders placed in visit on 07/08/14  CBC WITH DIFFERENTIAL      Result Value Ref Range   WBC 7.5  3.9 - 10.3 10e3/uL   NEUT# 6.3  1.5 - 6.5 10e3/uL   HGB 12.1  11.6 - 15.9 g/dL   HCT 36.6  34.8 - 46.6 %   Platelets 200  145 - 400 10e3/uL   MCV 96.1  79.5 - 101.0 fL   MCH 31.8  25.1 - 34.0 pg   MCHC 33.1  31.5 - 36.0 g/dL   RBC 3.81  3.70 - 5.45 10e6/uL   RDW 13.7  11.2 - 14.5 %   lymph# 0.9  0.9 - 3.3 10e3/uL   MONO# 0.3  0.1 - 0.9 10e3/uL   Eosinophils Absolute 0.1  0.0 - 0.5 10e3/uL   Basophils Absolute 0.0  0.0 - 0.1 10e3/uL   NEUT% 82.9 (*) 38.4 - 76.8 %   LYMPH% 11.4 (*) 14.0 - 49.7 %   MONO% 4.5  0.0 - 14.0 %   EOS% 1.1  0.0 - 7.0 %   BASO% 0.1  0.0 - 2.0 %  COMPREHENSIVE METABOLIC PANEL (LA45)      Result Value Ref Range   Sodium 143  136 - 145 mEq/L   Potassium 3.3 (*) 3.5 - 5.1 mEq/L   Chloride 105  98 - 109 mEq/L   CO2 27  22 - 29 mEq/L   Glucose 227 (*) 70 - 140 mg/dl   BUN 13.0  7.0 - 26.0 mg/dL   Creatinine 0.9  0.6 - 1.1 mg/dL   Total Bilirubin 0.69  0.20 - 1.20 mg/dL   Alkaline Phosphatase 73  40 - 150 U/L   AST 17  5 - 34 U/L   ALT 11  0 - 55 U/L   Total Protein 7.0  6.4 - 8.3 g/dL   Albumin 3.8  3.5 - 5.0 g/dL   Calcium 10.9 (*) 8.4 - 10.4 mg/dL   Anion Gap 11  3 - 11 mEq/L    We will let her know K a little low, recommend  increase in diet  Studies/Results:  No results found. Will move PET earlier in Sept if possible. Will get echocardiogram as it is likely that we may want to try doxil at some point.  Medications: I have reviewed the patient's current medications. She is no longer using duragesic. Written and oral instructions as follows: Increase miralax to one capful twice daily. Fine to use dulcolax tablets in addition, or dulcolax or glycerin supppository in addition if needed. You can use oxycodone 5 mg, one or two tablets every 4-6 hrs as needed for pain.  Fine to use mirtazapine + melatonin at bedtime At bedtime tonight, try 1/2 mirtazapine + one oxycodone  DISCUSSION: medications as above. Will set up PET sooner in Sept but not to interfere with upcoming wedding, and I will see her back after that scan, unless timing is such that Dr Leone Brand late Sept appointment is still best.   Assessment/Plan: 1.endometrial carcinoma IA, grade 1 with no high risk features at surgery 12-2011, then recurrent to vagina 04-2013 and progressive in pelvis despite carboplatin/taxane thru 01-15-2014. RT completed 03-27-14, clinically improved initially, residual findings on CT and gyn onc exam in July, now increased low abdominal and low back pain. Increase laxatives, pain meds and sleeping medication. Will get PET sooner than originally planned. Will get echocardiogram as doxil may be reasonable if progression/ active disease is documented. 2.PAC in, flushed today and will need flush ~ every 6-8 weeks if not used.  3.multifactoria anemia: ongoing bleeding, pelvic radiation and recent chemotherapy. Now on po iron and hemoglobin improving. Follow on the po iron.  4.diabetes, HTN,hx nephrolithiasis, left breast biopsies 5.insomnia: as above. Dr Scotty Court aware 6.hypokalemia mild, increase in diet and follow   Patient understands discussion and recommendations and is in agreement with plans. She will call prior to next visit if  needed. Cc this note to Drs [name] and Scotty Court. Time spent 25 min including >50% counseling and coordination of care.    LIVESAY,LENNIS P, MD   07/08/2014, 10:57 AM

## 2014-07-08 NOTE — Telephone Encounter (Signed)
per pof to sch pt appt-sent to Vaughan Basta D to pre-cert [facility] pt will call w/sch-gave pt copy of sch

## 2014-07-08 NOTE — Patient Instructions (Signed)
Increase miralax to one capful twice daily. Fine to use dulcolax tablets in addition, or dulcolax or glycerin supppository in addition if needed.  You can use oxycodone 5 mg, one or two tablets every 4-6 hrs as needed for pain.   Fine to use mirtazapine + melatonin at bedtime   At bedtime tonight, try 1/2 mirtazapine + one oxycodone

## 2014-07-08 NOTE — Patient Instructions (Signed)

## 2014-07-09 ENCOUNTER — Telehealth: Payer: Self-pay | Admitting: Oncology

## 2014-07-09 NOTE — Telephone Encounter (Signed)
per pof to sch [pt ECHO per Vaughan Basta no pre cert req-cld & spoke w/pt gave pt time & date of appt

## 2014-07-21 ENCOUNTER — Other Ambulatory Visit: Payer: Self-pay | Admitting: Oncology

## 2014-07-22 ENCOUNTER — Ambulatory Visit (HOSPITAL_COMMUNITY)
Admission: RE | Admit: 2014-07-22 | Discharge: 2014-07-22 | Disposition: A | Discharge status: Home / Self Care | Payer: Medicare Other | Source: Ambulatory Visit | Attending: Diagnostic Radiology | Admitting: Diagnostic Radiology

## 2014-07-22 ENCOUNTER — Encounter (HOSPITAL_COMMUNITY): Payer: Self-pay

## 2014-07-22 DIAGNOSIS — C187 Malignant neoplasm of sigmoid colon: Secondary | ICD-10-CM | POA: Diagnosis not present

## 2014-07-22 DIAGNOSIS — C541 Malignant neoplasm of endometrium: Secondary | ICD-10-CM

## 2014-07-22 DIAGNOSIS — C549 Malignant neoplasm of corpus uteri, unspecified: Secondary | ICD-10-CM | POA: Insufficient documentation

## 2014-07-22 DIAGNOSIS — C52 Malignant neoplasm of vagina: Secondary | ICD-10-CM | POA: Insufficient documentation

## 2014-07-22 DIAGNOSIS — C495 Malignant neoplasm of connective and soft tissue of pelvis: Secondary | ICD-10-CM | POA: Insufficient documentation

## 2014-07-22 LAB — GLUCOSE, CAPILLARY: Glucose-Capillary: 165 mg/dL — ABNORMAL HIGH (ref 70–99)

## 2014-07-22 MED ORDER — FLUDEOXYGLUCOSE F - 18 (FDG) INJECTION: 6.3000 | Freq: Once | INTRAVENOUS | Status: AC | PRN

## 2014-07-23 ENCOUNTER — Ambulatory Visit (HOSPITAL_COMMUNITY)
Admission: RE | Admit: 2014-07-23 | Discharge: 2014-07-23 | Disposition: A | Discharge status: Home / Self Care | Payer: Medicare Other | Source: Ambulatory Visit | Attending: Oncology | Admitting: Oncology

## 2014-07-23 DIAGNOSIS — Z01818 Encounter for other preprocedural examination: Secondary | ICD-10-CM | POA: Diagnosis present

## 2014-07-23 DIAGNOSIS — I079 Rheumatic tricuspid valve disease, unspecified: Secondary | ICD-10-CM | POA: Diagnosis not present

## 2014-07-23 DIAGNOSIS — C541 Malignant neoplasm of endometrium: Secondary | ICD-10-CM

## 2014-07-23 DIAGNOSIS — I059 Rheumatic mitral valve disease, unspecified: Secondary | ICD-10-CM | POA: Diagnosis not present

## 2014-07-23 DIAGNOSIS — C549 Malignant neoplasm of corpus uteri, unspecified: Secondary | ICD-10-CM | POA: Diagnosis not present

## 2014-07-23 NOTE — Progress Notes (Signed)
Echocardiogram 2D Echocardiogram has been performed.  Pam Vazquez M 07/23/2014, 11:12 AM

## 2014-07-27 ENCOUNTER — Other Ambulatory Visit (HOSPITAL_BASED_OUTPATIENT_CLINIC_OR_DEPARTMENT_OTHER): Payer: Medicare Other

## 2014-07-27 ENCOUNTER — Telehealth: Payer: Self-pay | Admitting: Oncology

## 2014-07-27 ENCOUNTER — Ambulatory Visit (HOSPITAL_BASED_OUTPATIENT_CLINIC_OR_DEPARTMENT_OTHER): Payer: Medicare Other | Admitting: Oncology

## 2014-07-27 ENCOUNTER — Encounter: Payer: Self-pay | Admitting: Oncology

## 2014-07-27 ENCOUNTER — Encounter: Payer: Self-pay | Admitting: Internal Medicine

## 2014-07-27 VITALS — BP 146/74 | HR 76 | Temp 98.0°F | Resp 18 | Ht 60.0 in | Wt 125.3 lb

## 2014-07-27 DIAGNOSIS — D649 Anemia, unspecified: Secondary | ICD-10-CM

## 2014-07-27 DIAGNOSIS — Z23 Encounter for immunization: Secondary | ICD-10-CM

## 2014-07-27 DIAGNOSIS — C549 Malignant neoplasm of corpus uteri, unspecified: Secondary | ICD-10-CM

## 2014-07-27 DIAGNOSIS — C541 Malignant neoplasm of endometrium: Secondary | ICD-10-CM

## 2014-07-27 DIAGNOSIS — E119 Type 2 diabetes mellitus without complications: Secondary | ICD-10-CM

## 2014-07-27 LAB — CBC WITH DIFFERENTIAL/PLATELET
BASO%: 0.4 % (ref 0.0–2.0)
Basophils Absolute: 0 10*3/uL (ref 0.0–0.1)
EOS%: 0.8 % (ref 0.0–7.0)
Eosinophils Absolute: 0.1 10*3/uL (ref 0.0–0.5)
HCT: 34.5 % — ABNORMAL LOW (ref 34.8–46.6)
HEMOGLOBIN: 11.4 g/dL — AB (ref 11.6–15.9)
LYMPH%: 20.1 % (ref 14.0–49.7)
MCH: 31.6 pg (ref 25.1–34.0)
MCHC: 33.1 g/dL (ref 31.5–36.0)
MCV: 95.4 fL (ref 79.5–101.0)
MONO#: 0.6 10*3/uL (ref 0.1–0.9)
MONO%: 7.7 % (ref 0.0–14.0)
NEUT#: 5.5 10*3/uL (ref 1.5–6.5)
NEUT%: 71 % (ref 38.4–76.8)
PLATELETS: 228 10*3/uL (ref 145–400)
RBC: 3.62 10*6/uL — AB (ref 3.70–5.45)
RDW: 13.2 % (ref 11.2–14.5)
WBC: 7.8 10*3/uL (ref 3.9–10.3)
lymph#: 1.6 10*3/uL (ref 0.9–3.3)

## 2014-07-27 LAB — COMPREHENSIVE METABOLIC PANEL (CC13)
ALBUMIN: 3.6 g/dL (ref 3.5–5.0)
ALT: 12 U/L (ref 0–55)
ANION GAP: 11 meq/L (ref 3–11)
AST: 17 U/L (ref 5–34)
Alkaline Phosphatase: 78 U/L (ref 40–150)
BUN: 11.4 mg/dL (ref 7.0–26.0)
CALCIUM: 10.6 mg/dL — AB (ref 8.4–10.4)
CO2: 28 mEq/L (ref 22–29)
Chloride: 104 mEq/L (ref 98–109)
Creatinine: 0.8 mg/dL (ref 0.6–1.1)
GLUCOSE: 113 mg/dL (ref 70–140)
POTASSIUM: 3.4 meq/L — AB (ref 3.5–5.1)
SODIUM: 143 meq/L (ref 136–145)
Total Bilirubin: 0.46 mg/dL (ref 0.20–1.20)
Total Protein: 7 g/dL (ref 6.4–8.3)

## 2014-07-27 MED ORDER — OXYCODONE HCL 5 MG PO TABS: 5.0000 mg | ORAL_TABLET | ORAL | Status: DC | PRN

## 2014-07-27 MED ORDER — OXYCODONE HCL ER 10 MG PO T12A: 10.0000 mg | EXTENDED_RELEASE_TABLET | Freq: Two times a day (BID) | ORAL | Status: DC

## 2014-07-27 MED ORDER — INFLUENZA VAC SPLIT QUAD 0.5 ML IM SUSY
0.5000 mL | PREFILLED_SYRINGE | Freq: Once | INTRAMUSCULAR | Status: AC
  Administered 2014-07-27: 0.5 mL via INTRAMUSCULAR
  Filled 2014-07-27: qty 0.5

## 2014-07-27 NOTE — Telephone Encounter (Signed)
per pof to sch pt appt-sch appt w/Dr perry-gave pt copy of sch

## 2014-07-27 NOTE — Progress Notes (Signed)
OFFICE PROGRESS NOTE   07/27/2014   Physicians:David [name] (PCP [place]), W.Brewster, J.Kinard, S.[name], Scarlette Shorts   INTERVAL HISTORY:  Patient is seen, together with daughter, in continuing attention to endometrial carcinoma which has been recurrent in pelvis. The recurrent disease initially progressed on carboplatin taxane thru 01-15-14,  then was treated with radiation by Dr Sondra Come completed 03-27-14. Last CT AP was 05-19-14, and she has had PET done 07-22-14 due to some recent increased pain and increased constipation (see below).  She has also had echocardiogram, in case chemotherapy with doxil (or adriamycin) is needed. She is to see Dr [name] on 08-12-14.  Patient is known to Dr Scarlette Shorts of GI.  Constipation is some better but not resolved using miralax usually daily, colace daily, sometimes dulcolax tabs. Last bowel movement was 2 days ago. She has had no nausea or vomiting and no bleeding. Low back and pelvic discomfort does seem better when bowels move well; she is using tylenol 500 mg tid and oxycodone ~ 10 mg once or twice daily for the discomfort, without complete relief.  She has PAC, this used and flushed with PET. She was given flu vaccine today.  Another daughter was married at patient's home on 07-20-14. They plan family vacation week of Oct 11.    ONCOLOGIC HISTORY Patient had robotic assisted total laparoscopic hysterectomy bilateral salpingo-oophorectomy right pelvic lymph node biopsy on 12/19/2011. 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 There was no involvement of other organs lymphovascular invasion was not identified. 3 lymph nodes were negative for metastatic disease. 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  Patient was  seen by Dr. [name] on 04/29/2013 with a nodule in the vagina concerning for recurrence as well as 8 cm rectal mass. CT of the abdomen and pelvis showed a lobular mass at the vagina cuff measuring 7.0 x 3.2 cm proximally 4 cm craniocaudal dimension, with compression of the distal aspect of the sigmoid colon just above the rectum . There was also noted to be a large necrotic lymph node measuring 2.8 cm within the sigmoid mesocolon centrally. There are also small bilateral external iliac lymph nodes. There were no aggressive osseous lesions. 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  Patient received 6 cycles of taxol carboplatin from  06/03/2013 thru 78/24//2353, complicated by some peripheral neuropathy. There was an interval decrease in tumor burden therefore Dr. [name] recommended 3 additional cycles of Taxotere/Carboplatin, received from 11/27/13 through 01/15/14.  CT of the chest, abdomen, and pelvis on 02/10/14 demonstrated disease progression. She had radiation by Dr Sondra Come,  [place] given from 02/23/14 thru 03-27-14.  Review of systems as above, also: No fever or symptoms of infection. No respiratory symptoms. No bleeding. No problems with PAC. No LE swelling. Appetite is fair. Remainder of 10 point Review of Systems negative.  Objective:  Vital signs in last 24 hours:  BP 146/74  Pulse 76  Temp(Src) 98 F (36.7 C) (Oral)  Resp 18  Ht 5' (1.524 m)  Wt 125 lb 4.8 oz (56.836 kg)  BMI 24.47 kg/m2  Alert, oriented and appropriate. Ambulatory without difficulty.  No alopecia  HEENT:PERRL, sclerae not icteric. Oral mucosa moist without lesions, posterior pharynx clear.  Neck supple. No JVD.  Lymphatics:no cervical,suraclavicular, axillary or inguinal  adenopathy Resp: clear to auscultation bilaterally and normal percussion bilaterally Cardio: regular rate and rhythm. No gallop. GI: soft, nontender, not distended, no appreciable  mass or organomegaly. Some bowel sounds. Surgical incision not remarkable. Musculoskeletal/ Extremities: without pitting edema, cords, tenderness Neuro: no change peripheral neuropathy. Otherwise nonfocal. PSYCH appropriate mood and affect Skin without rash, ecchymosis, petechiae Portacath-without erythema or tenderness  Lab Results:  Results for orders placed in visit on 07/27/14  CBC WITH DIFFERENTIAL      Result Value Ref Range   WBC 7.8  3.9 - 10.3 10e3/uL   NEUT# 5.5  1.5 - 6.5 10e3/uL   HGB 11.4 (*) 11.6 - 15.9 g/dL   HCT 34.5 (*) 34.8 - 46.6 %   Platelets 228  145 - 400 10e3/uL   MCV 95.4  79.5 - 101.0 fL   MCH 31.6  25.1 - 34.0 pg   MCHC 33.1  31.5 - 36.0 g/dL   RBC 3.62 (*) 3.70 - 5.45 10e6/uL   RDW 13.2  11.2 - 14.5 %   lymph# 1.6  0.9 - 3.3 10e3/uL   MONO# 0.6  0.1 - 0.9 10e3/uL   Eosinophils Absolute 0.1  0.0 - 0.5 10e3/uL   Basophils Absolute 0.0  0.0 - 0.1 10e3/uL   NEUT% 71.0  38.4 - 76.8 %   LYMPH% 20.1  14.0 - 49.7 %   MONO% 7.7  0.0 - 14.0 %   EOS% 0.8  0.0 - 7.0 %   BASO% 0.4  0.0 - 2.0 %  COMPREHENSIVE METABOLIC PANEL (WE99)      Result Value Ref Range   Sodium 143  136 - 145 mEq/L   Potassium 3.4 (*) 3.5 - 5.1 mEq/L   Chloride 104  98 - 109 mEq/L   CO2 28  22 - 29 mEq/L   Glucose 113  70 - 140 mg/dl   BUN 11.4  7.0 - 26.0 mg/dL   Creatinine 0.8  0.6 - 1.1 mg/dL   Total Bilirubin 0.46  0.20 - 1.20 mg/dL   Alkaline Phosphatase 78  40 - 150 U/L   AST 17  5 - 34 U/L   ALT 12  0 - 55 U/L   Total Protein 7.0  6.4 - 8.3 g/dL   Albumin 3.6  3.5 - 5.0 g/dL   Calcium 10.6 (*) 8.4 - 10.4 mg/dL   Anion Gap 11  3 - 11 mEq/L    Hgb 12.1 in August and 10.9 in June  Per patient, last A1c was 5.4  Studies/Results: NUCLEAR MEDICINE PET SKULL BASE TO THIGH   07-22-2014  COMPARISON: PET-CT 05/09/2013, CT scan 05/19/2014  FINDINGS:  NECK  No hypermetabolic lymph nodes in the neck.  CHEST  No hypermetabolic mediastinal or hilar nodes. No suspicious  pulmonary  nodules on the CT scan.  ABDOMEN/PELVIS  Within the pelvis there is again demonstrated intense metabolic  activity at the vaginal cuff. The metabolic activity is decreased  compared to prior exam with SUV max 6.5 decreased from 21.6 on  prior. Additionally the bulkiness of the recurrence of the vaginal  cuff is decreased compared to prior PET-CT scan and similar to the  recent CT scan.  The mesenteric mass adjacent to the sigmoid colon measures 28 x 34  mm which is increased from recent CT or of lesion measuring 23 x 23  mm. This may be related to technique. This lesion is decreased  significantly in metabolic activity compared to prior PET-CT scan  with SUV max  10.0 compared to 16.7.  The lesion within the right iliacus muscle measuring 28 x 23 mm on  image 155, series 14 is also decreased in metabolic activity  compared to priors PET-CT scan with SUV max 6.3 compared to 15.6.  There is no new up evidence of new disease oin the abdomen or  pelvis.  SKELETON  No focal hypermetabolic activity to suggest skeletal metastasis.  IMPRESSION:  1. 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 Metabolic  activity within these lesions is decreased significantly from prior  PET-CT scan.  2. No evidence of new disease within the pelvis or outside of the  pelvis.     Transthoracic Echocardiography  07-23-2014  ------------------------------------------------------------------- LV EF: 55% - 60%  ------------------------------------------------------------------- Indications: V58.11 Chemotherapy Evaluation.  ------------------------------------------------------------------- History: PMH: Cancer of the Endometrium. No prior cardiac history.  ------------------------------------------------------------------- Study Conclusions  - Left ventricle: The cavity size was normal. Systolic function was normal. The estimated  ejection fraction was in the range of 55% to 60%. Wall motion was normal; there were no regional wall motion abnormalities. - Atrial septum: No defect or patent foramen ovale was identified. - Pericardium, extracardiac: A trivial pericardial effusion was identified. - Impressions: Normal global longitudinal strain -17.6     Medications: I have reviewed the patient's current medications. I have instructed her to increase miralax to bid, continue colace daily, and add dulcolax if bowels are not moving well every day with this. She will try the increased laxatives for 2-3 days, but if discomfort is not resolved with improvement in bowel elimination she will continue the laxatives and add oxycontin 10 mg q 12 hrs. We have discussed slow release pain medication and importance of taking this regularly.  DISCUSSION: we have reviewed PET information as above, this showing some active disease in the areas previously involved, but no new areas and no areas outside of pelvis. Comparison PET was June 2014, prior to any chemo or RT.  The echocardiogram has good LV function. I will let Dr Sondra Come know about the PET findings; she is not presently scheduled back to him. She will keep appointment with Dr [name] on 08-12-14. I will follow up after Dr Leone Brand appointment.  We will also ask Dr [name] to see her, for possible other evaluation or suggestions.    Assessment/Plan: 1.endometrial carcinoma IA, grade 1 with no high risk features at surgery 12-2011, then recurrent to vagina 04-2013 and progressive in pelvis despite carboplatin/taxane x 9 cycles thru 01-15-2014. RT completed 03-27-14, clinically improved initially, residual findings on CT and gyn onc exam in [place] recently more pelvic discomfort and constipation. PET with disease activity in previous sites in pelvis. Plan as above including increase in laxatives and possible addition of oxycontin. 2.PAC in, flushed 07-22-14 3.multifactoria anemia:  hemoglobin fairly stable and not symptomatic today 4.diabetes, HTN,hx nephrolithiasis, left breast biopsies  5.flu vaccine given today. 6.hypokalemia: mild, trying to address with diet.    All questions answered. Time spent 30 min including >50% counseling and coordination of care.    Agustin Swatek P, MD   07/27/2014, 1:39 PM

## 2014-07-27 NOTE — Patient Instructions (Signed)
Increase miralax to twice daily; oK to continue stool softener -- trying to keep bowels moving well daily. Can add dulcolax tablet to the miralax if needed.  OK to leave prescription for long lasting pain medicine at pharmacy if you want to see how you feel with the increase in laxatives first. If still uncomfortable after 2-3 days of increased miralax, begin the Oxycontin (slow release/ extended release oxycodone) 10 mg every 12 hours. The slow release medicines need to stay in your system, so take this every 12 hours whether or not you are in pain when dose is due. You can also use the quick release oxycodone 5-10 mg every 4 hrs or tylenol with the slow release pain medicine if needed.

## 2014-07-28 ENCOUNTER — Other Ambulatory Visit: Payer: Self-pay | Admitting: Oncology

## 2014-07-28 ENCOUNTER — Telehealth: Payer: Self-pay | Admitting: Internal Medicine

## 2014-07-28 ENCOUNTER — Telehealth: Payer: Self-pay | Admitting: *Deleted

## 2014-07-28 NOTE — Telephone Encounter (Signed)
Pt scheduled to see Dr. [name] tomorrow at 10:15am, pt aware.

## 2014-07-28 NOTE — Telephone Encounter (Signed)
Left message to call.

## 2014-07-28 NOTE — Telephone Encounter (Signed)
Message copied by Patton Salles on Tue Jul 28, 2014  8:43 AM ------      Message from: Gordy Levan      Created: Tue Jul 28, 2014  7:25 AM       Labs seen and need follow up: please let her know K just a little low. Suggest increase in diet.       Ask her to let me know how she is with med changes by end of this week (she was to increase miralax to bid, then if abd/ low back symptoms not improved in 2-3 days she is to continue laxatives enough to keep bowels moving daily and add oxycontin 10 mg q 12 hrs in addition to prn tylenol and quick release oxycodone) ------

## 2014-07-29 ENCOUNTER — Encounter: Payer: Self-pay | Admitting: Internal Medicine

## 2014-07-29 ENCOUNTER — Ambulatory Visit (INDEPENDENT_AMBULATORY_CARE_PROVIDER_SITE_OTHER): Payer: Medicare Other | Admitting: Internal Medicine

## 2014-07-29 VITALS — BP 124/74 | HR 76 | Ht 60.0 in | Wt 126.0 lb

## 2014-07-29 DIAGNOSIS — R933 Abnormal findings on diagnostic imaging of other parts of digestive tract: Secondary | ICD-10-CM

## 2014-07-29 DIAGNOSIS — C541 Malignant neoplasm of endometrium: Secondary | ICD-10-CM

## 2014-07-29 DIAGNOSIS — R1084 Generalized abdominal pain: Secondary | ICD-10-CM

## 2014-07-29 DIAGNOSIS — K625 Hemorrhage of anus and rectum: Secondary | ICD-10-CM

## 2014-07-29 DIAGNOSIS — K5900 Constipation, unspecified: Secondary | ICD-10-CM

## 2014-07-29 DIAGNOSIS — C549 Malignant neoplasm of corpus uteri, unspecified: Secondary | ICD-10-CM

## 2014-07-29 MED ORDER — MOVIPREP 100 G PO SOLR: 1.0000 | Freq: Once | ORAL | Status: DC

## 2014-07-29 NOTE — Patient Instructions (Signed)
You have been scheduled for a colonoscopy. Please follow written instructions given to you at your visit today.  Please pick up your prep kit at the pharmacy within the next 1-3 days. If you use inhalers (even only as needed), please bring them with you on the day of your procedure.   As discussed with Dr. [name], increase your Miralax as needed

## 2014-07-29 NOTE — Progress Notes (Signed)
HISTORY OF PRESENT ILLNESS:  Pam Vazquez is a pleasant 68 y.o. female , widow of former patient Pam Vazquez, who is referred today by Dr. [name], regarding constipation and lower abdominal/pelvic pain. 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 She subsequently underwent pelvic surgery. Unfortunately, one year later she developed recurrence in the pelvis. She had progression on chemotherapy and was subsequently treated with radiation therapy until mid May 2015. She has been on varying doses of narcotics for pain. Worsening problems with constipation this year. Seems to have worsening abdominal discomfort with constipation, but not exclusively. She has been on stool softeners, laxatives, and MiraLax. MiraLax just increased to twice a day yesterday. She did have complete colonoscopy in October 2007. This was normal. Patient also reports minor rectal bleeding. She did have CT scan of the abdomen and pelvis performed 05/19/2014. Reviewed. No colonic abnormality noted.  REVIEW OF SYSTEMS:  All non-GI ROS negative except for sleeping problems, back pain,  Past Medical History  Diagnosis Date  . Diabetes mellitus   . PONV (postoperative nausea and vomiting)   . Cancer     endometrial  . Angina     stress test on chart from 3/12/ none since March 2012  . Chronic kidney disease     kidney stones  . Hypertension     / EKG 1/13 Epic  . Radiation 02/23/14-03/27/14    45 gray to pelvic mass  . Glaucoma     Past Surgical History  Procedure Laterality Date  . Hysteroscopy w/d&c  11/21/2011    Procedure: DILATATION AND CURETTAGE /HYSTEROSCOPY;  Surgeon: Gus Height;  Location: [facility];  Service: Gynecology;  Laterality: N/A;  . Tubal ligation    . Breast mass removal  1984, 1992    L breast  . Lithotripsy  2000, 2002  . Back surgery      91 and 2001  . Dilation and curettage of uterus    . Node dissection  12/19/2011    Procedure: NODE DISSECTION;  Surgeon: [name], MD PHD;  Location: [facility];  Service: Gynecology;  Laterality: N/A;  . Robotic assisted total hysterectomy with bilateral salpingo oopherectomy  12/19/2011    robotic-assisted total laparoscopic hysterectomy bilateral salpingo-oophorectomy right pelvic lymph node biopsy on 12/19/2011 h    Social History Pam Vazquez  reports that she quit smoking about 28 years ago. She has never used smokeless tobacco. She reports that she does not drink alcohol or use illicit drugs.  family history includes Diabetes in her mother; Heart attack in her mother; Heart disease in her mother; Stomach cancer in her maternal grandmother.  No Known Allergies     PHYSICAL EXAMINATION: Vital signs: BP 124/74  Pulse 76  Ht 5' (1.524 m)  Wt 126 lb (57.153 kg)  BMI 24.61 kg/m2  Constitutional: generally well-appearing, no acute distress Psychiatric: alert and oriented x3, cooperative Eyes: extraocular movements intact, anicteric, conjunctiva pink Mouth: oral pharynx moist, no lesions Neck: supple no lymphadenopathy Cardiovascular: heart regular rate and rhythm, no murmur Lungs: clear to auscultation bilaterally Abdomen: soft, nontender, nondistended, no obvious ascites, no peritoneal signs, normal bowel sounds, no organomegaly Rectal: Deferred to colonoscopy Extremities: no lower extremity edema bilaterally Skin: no lesions on visible extremities Neuro: No focal deficits.   ASSESSMENT:  #1. Locally recurrent endometrial carcinoma. Prior surgery, chemotherapy, radiation therapy. #2. Constipation. At least in part due to necessary narcotics for pain control #3. Lower abdominal pain. Likely combination of #1  and #2 above #4. Minor rectal bleeding   PLAN:  #1. Instructed to titrate MiraLax as needed to achieve one or 2 bowel movements daily. #2. Schedule colonoscopy to rule out significant intrinsic compression nor possibly malignant invasion.The nature of the procedure, as well as the risks,  benefits, and alternatives were carefully and thoroughly reviewed with the patient. Ample time for discussion and questions allowed. The patient understood, was satisfied, and agreed to proceed. #3. Movi prep prescribed. Patient instructed on his use

## 2014-07-31 NOTE — Telephone Encounter (Signed)
Spoke with Ms. Pam Vazquez and she stated that the Miralax bid has helped her bowels move.  She has been having some abdominal cramps and will see if a half a dose of miralax would be effective.  She will paly with the dosing.   Some of her discomfort may be from the disease as she has not filled the OxyContin prescription due to prior authorization being needed.   Obtained authorization from insurance and notified the pharmacy.  Ms. Pam Vazquez will pick up the prescription in the morning.

## 2014-08-06 ENCOUNTER — Telehealth: Payer: Self-pay | Admitting: Internal Medicine

## 2014-08-07 ENCOUNTER — Telehealth: Payer: Self-pay

## 2014-08-07 NOTE — Telephone Encounter (Signed)
Let patient know that since her insurance would not cover her moviprep, I would leave a free voucher for it up front for her to pick up.  Patient agreed.

## 2014-08-07 NOTE — Telephone Encounter (Signed)
Left free moviprep voucher up front for patient.  Patient agreed to come pick up

## 2014-08-09 ENCOUNTER — Other Ambulatory Visit: Payer: Self-pay | Admitting: Oncology

## 2014-08-09 DIAGNOSIS — C541 Malignant neoplasm of endometrium: Secondary | ICD-10-CM

## 2014-08-10 ENCOUNTER — Ambulatory Visit (HOSPITAL_COMMUNITY): Payer: Medicare Other

## 2014-08-11 ENCOUNTER — Telehealth: Payer: Self-pay | Admitting: Gynecologic Oncology

## 2014-08-11 NOTE — Telephone Encounter (Signed)
Office Visit: GYN ONCOLOGY   Chief Complaint: Recurrent  endometrial cancer   Assessment:   68 y.o. . 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 Her postop course has been complicated by persistent granulation tissue. 04/2013 vaginal examination is notable for a nodule in the vagina this was biopsied. A rectal examination firm 8 cm mass was noted. Biopsy c/w recurrent endometroid cancer. [name] has received 6 cycles taxol/carbo. Repeat CT 4 weeks after cycle 6 notabkle for interval decrease in tumor burden. She has received 3 additional cycles of Taxol/Carbolast dose 01/2014. Physical examination and imaging 01/2014 confirm disease progression of rectovaginal mass and pelvic LN.  Pelvic radiotherapy recommended  Patient was advised that the success rate of chemotherapy of agents other than taxane/platin is very low. She completed pelvic XRT 03/2014.  Imaging 05/2014 shows disease reduction. Physical examination is notable for a dense fibrotic induration at the vaginal apex. I am unsure it the residual lesions are metabolically active. Will collect PET in September 2015.  If the recurrence is not controlled with radiotherapy her outcome is not favourable   HPI: Pam Vazquez is a 68 y.o. initially seen in consultation on 11/27/2011 at the request of Dr. Melinda Crutch for evaluation of a grade 1 endometrial adenocarcinoma.. She then underwent a robotic-assisted total laparoscopic hysterectomy bilateral salpingo-oophorectomy right pelvic lymph node biopsy on 53/97/6734 without complications. Her final pathologic diagnosis is a Stage Ia Grade 1 endometrioid endometrial cancer with no evidence of lymphovascular space invasion, 5/16 mm (31 %) of myometrial invasion and negative lymph nodes.   Pam Vazquez presented to Dr. Delene Loll in October of 2013. A Pap test at that time was within normal limits  At a visit 04/29/2013 with Gyn Onc granulation tissue was noted and a  pevlic mass was appreciated and biopsied.  Diagnosis  Vagina, biopsy, apex  - SLIGHTLY INFLAMED SQUAMOUS EPITHELIUM. - NO EVIDENCE OF DYSPLASIA OR MALIGNANCY.  CT 05/05/2013  1. Lobular mass at the vaginal cuff is consistent with endometrial carcinoma progression. 2. Mass extends to the serosal surface of the sigmoid colon and compresses the lumen of the sigmoid colon. No obstruction at this time.  3. Mass approximates the posterior wall of the bladder without clear invasion. 4. Large necrotic lymph node within the sigmoid mesocolon superior to the vaginal cuff. 5. Small bilateral iliac lymph nodes  CT guided bx 05/2013 Pelvis, biopsy, Central lower  - METASTATIC CARCINOMA WITH ASSOCIATED TUMOR NECROSIS.   CT scan after 6 cycles showed tumor reduction.  Pam Vazquez has received 9 cycles taxol/Carboplatin last cycle 01/2014.  CT 02/10/2014  IMPRESSION:  1. Progressive multifocal tumor recurrence in the pelvis. Dominant mass involves the vaginal cuff, and there are enlarged pelvic lymph nodes and mesenteric implants as described. One of these appears to be involving the sigmoid colon. While there is no evidence of high-grade bowel obstruction at this time, this does place the patient at risk for bowel obstruction.  2. No ascites or generalized peritoneal nodularity. No evidence of hematogenous metastatic disease.   Has received pelvic XRT Radiation treatment dates: April 13 through May 15  Site/dose: Pelvic mass 45 gray in 25 fractions  Beams/energy: Intensity modulated radiation therapy, rapid arc treatment, 6 MV photons  CT 05/19/2014 In the pelvis the soft tissue masses have decreased in size since the prior CT. The lesion adjacent to the psoas muscle on image number 59 measures 24 x 18 mm and previously measured 34  x 26 mm. The lesion adjacent to the mid sigmoid colon on image number 60 measures 23 x 23 mm and previously measured 24 x 22 mm. The lesion associated with the left vaginal cuff  previously measured 40 x 32 mm and now measures 32 x 25 mm. No new lesions. The bladder is unremarkable. No free pelvic fluid collections. No inguinal mass or adenopathy.   PET 07/2014 IMPRESSION:  1. Persistent hypermetabolic carcinoma at the vaginal cuff, within the sigmoid mesocolon and the right iliacus muscle. Metabolic activity within these lesions is decreased significantly from prior  PET-CT scan.  2. No evidence of new disease within the pelvis or outside of the pelvis.   PMH Past Medical History      Diagnosis    Date   .  Diabetes mellitus     .  PONV (postoperative nausea and vomiting)     .  Cancer       endometrial    .  Angina       stress test on chart from 3/12/ none since March 2012    .  Chronic kidney disease       kidney stones    .  Hypertension       / EKG 1/13 Epic     Past Surgical History       Procedure    Laterality  Date   .  Hysteroscopy w/d&c    11/21/2011     Procedure: DILATATION AND CURETTAGE /HYSTEROSCOPY; Surgeon: Gus Height; Location: [facility]; Service: Gynecology; Laterality: N/A;     .  Tubal ligation      .  Breast mass removal    20-Feb-1983, 1992     L breast     .  Lithotripsy    2000, 2002   .  Back surgery        Feb 19, 1990 and 02/20/00     .  Dilation and curettage of uterus      .  Node dissection    12/19/2011     Procedure: NODE DISSECTION; Surgeon: [name], MD PHD; Location: [facility]; Service: Gynecology; Laterality: N/A;     Social Hx. her husband died in 02/19/2013.   Review of systems:  Constitutional: Fair appetite  Ears, Nose, Mouth, Throat: No dizziness, headaches or changes in hearing.  Cardiovascular: No chest pain, palpitations or edema.  Respiratory: No shortness of breath, wheezing, intermittent  cough  Gastrointestinal: She has normal bowel movements without diarrhea or constipation. She denies any nausea or vomiting. No constipation or diarrhea Genitourinary: She denies pelvic pain, pelvic pressure or changes in her urinary function. She  has no hematuria, dysuria, or incontinence. Neurological: Denies dizziness or headaches. No neuropathy, no numbness or tingling.  Hematologic/Lymphatic: Vaginal bleeding   Physical Exam:  General: Well dressed, well nourished in no apparent distress.  Chest:  CTA Abdomen: Soft, nontender, nondistended. No palpable masses. No hepatosplenomegaly. No ascites. Normal bowel sounds. No hernias or tenderness at the laparoscopic trocar sites.  Genitourinary: Normal EGBUS Vaginal cuff with separation at the apex. Firm indurated mass at the cuff.  Trace bleeding on examination Rectal: cul de sac firm indurated area Extremities: No cyanosis, clubbing or edema. No calf tenderness or erythema. No palpable cords.  ke, alert and oriented x 3. Sensation is intact,  Musculoskeletal: No pain, normal strength and range of motion. LN:  No cervical supra-clavicular or inguinal adenopathy

## 2014-08-12 ENCOUNTER — Encounter: Payer: Self-pay | Admitting: Gynecologic Oncology

## 2014-08-12 ENCOUNTER — Ambulatory Visit: Payer: Medicare Other | Attending: Gynecologic Oncology | Admitting: Gynecologic Oncology

## 2014-08-12 VITALS — BP 143/76 | HR 78 | Temp 98.5°F | Resp 22 | Ht 60.0 in | Wt 122.3 lb

## 2014-08-12 DIAGNOSIS — C541 Malignant neoplasm of endometrium: Secondary | ICD-10-CM

## 2014-08-12 DIAGNOSIS — Z23 Encounter for immunization: Secondary | ICD-10-CM

## 2014-08-12 DIAGNOSIS — C549 Malignant neoplasm of corpus uteri, unspecified: Secondary | ICD-10-CM | POA: Insufficient documentation

## 2014-08-12 DIAGNOSIS — G893 Neoplasm related pain (acute) (chronic): Secondary | ICD-10-CM

## 2014-08-12 MED ORDER — FENTANYL 50 MCG/HR TD PT72: 50.0000 ug | MEDICATED_PATCH | TRANSDERMAL | Status: DC

## 2014-08-12 MED ORDER — OXYCODONE HCL 10 MG PO TABS: 10.0000 mg | ORAL_TABLET | Freq: Four times a day (QID) | ORAL | Status: DC | PRN

## 2014-08-12 NOTE — Progress Notes (Signed)
Office Visit: GYN ONCOLOGY   Chief Complaint: Recurrent  endometrial cancer   Assessment:   68 y.o. . 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 Her postop course has been complicated by persistent granulation tissue. 04/2013 vaginal examination is notable for a nodule in the vagina this was biopsied. A rectal examination firm 8 cm mass was noted. Biopsy c/w recurrent endometroid cancer. Pam Vazquez has received 6 cycles taxol/carbo. Repeat CT 4 weeks after cycle 6 notabkle for interval decrease in tumor burden. She has received a total of 9 cycles of Taxol/Carbo, the last dose 01/2014. Physical examination and imaging 01/2014 confirm disease progression of rectovaginal mass and pelvic LN.  Pelvic radiotherapy recommended   She completed pelvic XRT 03/2014.  Imaging 05/2014 showed disease reduction. Physical examination is notable for a dense fibrotic induration at the vaginal apex.   PET was obtained in September 2015.  That demonstrates highly metabolic activity in the central pelvis involving the sigmoid colon. Physical examination confirms the presence of a rectovaginal mass.  Options presented to the patient are total pelvic exenteration versus chemotherapy.    Patient was advised that the success rate of chemotherapy of agents other than taxane/platin is very low.  TPE would not be feasible if intraperitoneal disease were present and the success rate in approximately 40%.  She was counseled that there would be permanent diversion of the urinary and bowel tracts and of the potential of several post operative admissions.  Pam Vazquez willl discuss these options with her children and will follow-up with me at [facility] for further counseling on August 21, 2014.   HPI: Pam Vazquez is a 68 y.o. initially seen in consultation on 11/27/2011 at the request of Dr. Melinda Crutch for evaluation of a grade 1 endometrial adenocarcinoma.. She then underwent a robotic-assisted  total laparoscopic hysterectomy bilateral salpingo-oophorectomy right pelvic lymph node biopsy on 25/95/6387 without complications. Her final pathologic diagnosis is a Stage Ia Grade 1 endometrioid endometrial cancer with no evidence of lymphovascular space invasion, 5/16 mm (31 %) of myometrial invasion and negative lymph nodes.   Pam Vazquez presented to Dr. Delene Loll in October of 2013. A Pap test at that time was within normal limits  At a visit 04/29/2013 with Gyn Onc granulation tissue was noted and a pevlic mass was appreciated and biopsied.  Diagnosis  Vagina, biopsy, apex  - SLIGHTLY INFLAMED SQUAMOUS EPITHELIUM. - NO EVIDENCE OF DYSPLASIA OR MALIGNANCY.  CT 05/05/2013  1. Lobular mass at the vaginal cuff is consistent with endometrial carcinoma progression. 2. Mass extends to the serosal surface of the sigmoid colon and compresses the lumen of the sigmoid colon. No obstruction at this time.  3. Mass approximates the posterior wall of the bladder without clear invasion. 4. Large necrotic lymph node within the sigmoid mesocolon superior to the vaginal cuff. 5. Small bilateral iliac lymph nodes  CT guided bx 05/2013 Pelvis, biopsy, Central lower  - METASTATIC CARCINOMA WITH ASSOCIATED TUMOR NECROSIS.   CT scan after 6 cycles showed tumor reduction.  Pam Vazquez has received 9 cycles taxol/Carboplatin last cycle 01/2014.  CT 02/10/2014  IMPRESSION:  1. Progressive multifocal tumor recurrence in the pelvis. Dominant mass involves the vaginal cuff, and there are enlarged pelvic lymph nodes and mesenteric implants as described. One of these appears to be involving the sigmoid colon. While there is no evidence of high-grade bowel obstruction at this time, this does place the patient at risk  for bowel obstruction.  2. No ascites or generalized peritoneal nodularity. No evidence of hematogenous metastatic disease.   Has received pelvic XRT Radiation treatment dates: April 13 through May 15   Site/dose: Pelvic mass 45 gray in 25 fractions  Beams/energy: Intensity modulated radiation therapy, rapid arc treatment, 6 MV photons  CT 05/19/2014 In the pelvis the soft tissue masses have decreased in size since the prior CT. The lesion adjacent to the psoas muscle on image number 59 measures 24 x 18 mm and previously measured 34 x 26 mm. The lesion adjacent to the mid sigmoid colon on image number 60 measures 23 x 23 mm and previously measured 24 x 22 mm. The lesion associated with the left vaginal cuff previously measured 40 x 32 mm and now measures 32 x 25 mm. No new lesions. The bladder is unremarkable. No free pelvic fluid collections. No inguinal mass or adenopathy.   PET 07/2014 IMPRESSION:  1. Persistent hypermetabolic carcinoma at the vaginal cuff, within the sigmoid mesocolon and the right iliacus muscle. Metabolic activity within these lesions is decreased significantly from prior  PET-CT scan.  2. No evidence of new disease within the pelvis or outside of the pelvis.   PMH Past Medical History      Diagnosis    Date   .  Diabetes mellitus     .  PONV (postoperative nausea and vomiting)     .  Cancer       endometrial    .  Angina       stress test on chart from 3/12/ none since March 2012    .  Chronic kidney disease       kidney stones    .  Hypertension       / EKG 1/13 Epic     Past Surgical History       Procedure    Laterality  Date   .  Hysteroscopy w/d&c    11/21/2011     Procedure: DILATATION AND CURETTAGE /HYSTEROSCOPY; Surgeon: Gus Height; Location: [facility]; Service: Gynecology; Laterality: N/A;     .  Tubal ligation      .  Breast mass removal    1983/03/14, 1992     L breast     .  Lithotripsy    2000, 2002   .  Back surgery        13-Mar-1990 and 03/13/00     .  Dilation and curettage of uterus      .  Node dissection    12/19/2011     Procedure: NODE DISSECTION; Surgeon: [name], MD PHD; Location: [facility]; Service: Gynecology; Laterality: N/A;     Social Hx. her  husband died in 2013/03/13. Leaving for a family trip on August 23, 2014  Review of systems:  Constitutional: Fair appetite  Ears, Nose, Mouth, Throat: No dizziness, headaches or changes in hearing.  Cardiovascular: No chest pain, palpitations or edema.  Respiratory: No shortness of breath, wheezing, intermittent  cough  Gastrointestinal: She has normal bowel movements without diarrhea or constipation. She denies any nausea or vomiting. No rectal bleeding Genitourinary: She reports intermittent  pelvic pain and  pelvic pressure There are no changes in her urinary function. She has no hematuria, dysuria, or incontinence. Neurological: Denies dizziness or headaches. No neuropathy, no numbness or tingling.  Hematologic/Lymphatic: trace vaginal bleeding   Physical Exam:  BP 143/76  Pulse 78  Temp(Src) 98.5 F (36.9 C)  Resp 22  Ht 5' (  1.524 m)  Wt 122 lb 4.8 oz (55.475 kg)  BMI 23.89 kg/m2 Wt Readings from Last 3 Encounters:  08/12/14 122 lb 4.8 oz (55.475 kg)  07/29/14 126 lb (57.153 kg)  07/27/14 125 lb 4.8 oz (56.836 kg)    General: Well dressed, thin in no apparent distress.  Chest:  CTA Abdomen: Soft, nontender, nondistended. No palpable masses. No hepatosplenomegaly. No ascites. Normal bowel sounds. No hernias or tenderness at the laparoscopic trocar sites.  Genitourinary: Normal EGBUS Vaginal cuff with separation at the apex.tumor visible 2cm at the vaginal apex  No blood or discharge in the vaginal vault. Rectal: cul de sac firm indurated area approximately 5cm Extremities: No cyanosis, clubbing or edema. No calf tenderness or erythema. No palpable cords. , alert and oriented. Musculoskeletal: No pain, normal strength and range of motion. LN:  No cervical supra-clavicular or inguinal adenopathy

## 2014-08-12 NOTE — Progress Notes (Signed)
Patient not receiving adequate pain control on regimen of oxycontin 10 mg BID with oxycodone 5 mg PRN.  Experiencing moderate to severe abdominal pain.  Patient to resume Fentanyl patch per Dr. [name].  Previously taking Fentanyl 50 mcg changing every two days with pain manageable at that time.  Reportable signs and symptoms reviewed.  Patient is to take prescribed pain medications until Fentanyl patch begins working per Dr. [name] (within the next three days).  She is advised to call for any questions or concerns.

## 2014-08-12 NOTE — Addendum Note (Signed)
Addended by: Joylene John D on: 08/12/2014 03:41 PM   Modules accepted: Orders

## 2014-08-12 NOTE — Patient Instructions (Addendum)
Follow-up at [facility] 08/21/2014 to further discuss treatment option   Oct 9 at 8:30am Total Pelvic Exenteration The Operation This operation is called total pelvic exenteration because it removes the organs in your pelvic cavity. Three systems are involved: the gastrointestinal system, the urinary system, and the gynecologic system. (See Figures 1, 2, and 3.) During the operation, part of your large intestine (colon) will be removed. Your rectum and anus will also be taken out. This means you will need a new place for stool to leave your body. The end of the remaining intestine will be brought outside on your abdomen. This is called an ostomy. The ostomy is called a colostomy. The opening itself is called a stoma. A pouch will cover the stoma to collect stool. You will be taught how to care for the stoma as you recover. You will also get another booklet that answers questions you may have about living with an ostomy. Your bladder and urethra will be removed. This means you will need a new place for urine to leave your body. Your kidneys and ureters will be reconnected to a new urinary collection system. It is called a urinary diversion. There are two types of diversions. Each opens onto the abdomen. This opening is also called a stoma. With one type, the urine drains into a bag that you place around the stoma. This is called an ileal conduit. With the other, you will place a drainage tube in the stoma several times a day to drain the urine. This is called a urinary pouch. Your doctor will discuss these with you. Your nurse will give you a booklet describing the diversion you will have. Your ovaries, fallopian tubes, and uterus will be removed. All or part of your vagina might also be removed. In some cases, the vagina can be reconstructed. Ask your doctor if this is an option for you. A plastic surgeon will do it after the rest of the operation is done. A new vaginal canal is created from muscles and skin in other  areas of your body. Your nurse will tell you about the care of your new vagina. If you do not want reconstruction, the area can be closed or covered with a flap of skin. If your vagina is left, you should be able to have intercourse. If you can have reconstruction, you should be able to resume having intercourse when the area heals. If you cannot have reconstruction, remember that sex can include other forms of intimacy. Ask if your clitoris will be removed. Ask how much feeling you will retain in the vaginal area. Your doctor and nurse will tell you what to expect. For example, sensations may not be the same as they were before your surgery. You might like a referral to our Sexual Health Program. A therapist can assist you regarding these concerns.

## 2014-08-12 NOTE — Addendum Note (Signed)
Addended by: Joylene John D on: 08/12/2014 03:00 PM   Modules accepted: Orders

## 2014-08-19 ENCOUNTER — Encounter: Payer: Self-pay | Admitting: Oncology

## 2014-08-19 ENCOUNTER — Telehealth: Payer: Self-pay

## 2014-08-19 ENCOUNTER — Ambulatory Visit (HOSPITAL_COMMUNITY)
Admission: RE | Admit: 2014-08-19 | Discharge: 2014-08-19 | Disposition: A | Discharge status: Home / Self Care | Payer: Medicare Other | Source: Ambulatory Visit | Attending: Oncology | Admitting: Oncology

## 2014-08-19 ENCOUNTER — Telehealth: Payer: Self-pay | Admitting: Oncology

## 2014-08-19 ENCOUNTER — Ambulatory Visit (HOSPITAL_BASED_OUTPATIENT_CLINIC_OR_DEPARTMENT_OTHER): Payer: Medicare Other | Admitting: Oncology

## 2014-08-19 ENCOUNTER — Encounter (HOSPITAL_BASED_OUTPATIENT_CLINIC_OR_DEPARTMENT_OTHER): Payer: Medicare Other | Admitting: *Deleted

## 2014-08-19 VITALS — BP 147/69 | HR 81 | Temp 97.4°F | Resp 20 | Ht 60.0 in | Wt 123.0 lb

## 2014-08-19 DIAGNOSIS — R300 Dysuria: Secondary | ICD-10-CM

## 2014-08-19 DIAGNOSIS — C7989 Secondary malignant neoplasm of other specified sites: Secondary | ICD-10-CM

## 2014-08-19 DIAGNOSIS — C541 Malignant neoplasm of endometrium: Secondary | ICD-10-CM

## 2014-08-19 DIAGNOSIS — K5900 Constipation, unspecified: Secondary | ICD-10-CM | POA: Insufficient documentation

## 2014-08-19 DIAGNOSIS — D649 Anemia, unspecified: Secondary | ICD-10-CM

## 2014-08-19 DIAGNOSIS — R1030 Lower abdominal pain, unspecified: Secondary | ICD-10-CM | POA: Insufficient documentation

## 2014-08-19 DIAGNOSIS — E876 Hypokalemia: Secondary | ICD-10-CM

## 2014-08-19 DIAGNOSIS — E119 Type 2 diabetes mellitus without complications: Secondary | ICD-10-CM

## 2014-08-19 DIAGNOSIS — I10 Essential (primary) hypertension: Secondary | ICD-10-CM

## 2014-08-19 LAB — URINALYSIS, MICROSCOPIC - CHCC
BILIRUBIN (URINE): NEGATIVE
GLUCOSE UR CHCC: NEGATIVE mg/dL
KETONES: 5 mg/dL
LEUKOCYTE ESTERASE: NEGATIVE
NITRITE: NEGATIVE
Protein: NEGATIVE mg/dL
Specific Gravity, Urine: 1.005 (ref 1.003–1.035)
Urobilinogen, UR: 0.2 mg/dL (ref 0.2–1)
WBC UA: NEGATIVE (ref 0–2)
pH: 7 (ref 4.6–8.0)

## 2014-08-19 LAB — CBC WITH DIFFERENTIAL/PLATELET
BASO%: 0.3 % (ref 0.0–2.0)
BASOS ABS: 0 10*3/uL (ref 0.0–0.1)
EOS ABS: 0.1 10*3/uL (ref 0.0–0.5)
EOS%: 0.8 % (ref 0.0–7.0)
HCT: 33.3 % — ABNORMAL LOW (ref 34.8–46.6)
HGB: 11 g/dL — ABNORMAL LOW (ref 11.6–15.9)
LYMPH%: 13.6 % — ABNORMAL LOW (ref 14.0–49.7)
MCH: 31.4 pg (ref 25.1–34.0)
MCHC: 33 g/dL (ref 31.5–36.0)
MCV: 95.2 fL (ref 79.5–101.0)
MONO#: 0.5 10*3/uL (ref 0.1–0.9)
MONO%: 6.8 % (ref 0.0–14.0)
NEUT#: 6.2 10*3/uL (ref 1.5–6.5)
NEUT%: 78.5 % — AB (ref 38.4–76.8)
Platelets: 244 10*3/uL (ref 145–400)
RBC: 3.5 10*6/uL — AB (ref 3.70–5.45)
RDW: 13.2 % (ref 11.2–14.5)
WBC: 8 10*3/uL (ref 3.9–10.3)
lymph#: 1.1 10*3/uL (ref 0.9–3.3)

## 2014-08-19 LAB — COMPREHENSIVE METABOLIC PANEL (CC13)
ALBUMIN: 3.4 g/dL — AB (ref 3.5–5.0)
ALT: 8 U/L (ref 0–55)
AST: 14 U/L (ref 5–34)
Alkaline Phosphatase: 77 U/L (ref 40–150)
Anion Gap: 10 mEq/L (ref 3–11)
BUN: 10.5 mg/dL (ref 7.0–26.0)
CO2: 31 mEq/L — ABNORMAL HIGH (ref 22–29)
CREATININE: 0.8 mg/dL (ref 0.6–1.1)
Calcium: 10.9 mg/dL — ABNORMAL HIGH (ref 8.4–10.4)
Chloride: 101 mEq/L (ref 98–109)
GLUCOSE: 124 mg/dL (ref 70–140)
POTASSIUM: 3.5 meq/L (ref 3.5–5.1)
Sodium: 142 mEq/L (ref 136–145)
Total Bilirubin: 0.37 mg/dL (ref 0.20–1.20)
Total Protein: 7 g/dL (ref 6.4–8.3)

## 2014-08-19 NOTE — Telephone Encounter (Signed)
Told Ms. Pam Vazquez that the urinalysis looked good per Dr. [name].  Will wait and see if the urine culture shows infection. The abdominal x-ray did not show a complete blockage.  It did show that she has a lot of stool in her bowel.  Dr. [name] said to increase the laxatives as discussed today at her appointment.   Ms. Pam Vazquez did hear from Dr. Blanch Media office and has an appointment for tomorrow.

## 2014-08-19 NOTE — Progress Notes (Signed)
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 She saw Dr [name] on 08-12-14 and is to see her again at [facility] on 08-21-14. Dr [name] discussed total pelvic exenteration or possible more chemo; I have told patient that diverting loop colostomy might also help present problem even if she does not have TPE. She also saw Dr [name] after my last visit, with colonoscopy scheduled for 09-08-14. I have spoken with Dr Blanch Media RN directly now, as I wonder if flex sig might be helpful documenting how much blockage is at rectosigmoid area.   Patient has been on duragesic 50 mcg q 72 hrs since she saw Dr [name] last week; she cannot tell that this has helped abdominopelvic discomfort significantly. She has also continued bid oxycontin 10 mg, which I have asked her to stop now; she is using prn oxycodone 10 mg only ~ once in 24 hrs. She is on miralax bid, which is allowing small amounts of soft stool multiple times daily. Patient still feels constant need to evacuate bowels. Appetite is decreased with other GI symptoms; I have asked her to try low residue diet, which she has not been doing. She has had slight vaginal bleeding and today has had new dysuria.    She has PAC, flushed 07-22-14 with PET. She has had flu vaccine.  She wants to go on family vacation Oct 11-18.   She has family vacation 10-11 thru 08-30-14  She has PAC She had flu vaccine.  ONCOLOGIC HISTORY  Patient had robotic assisted total laparoscopic hysterectomy bilateral salpingo-oophorectomy right pelvic lymph node biopsy on 12/19/2011. Her initial pathology showed invasive endometrioid carcinoma FIGO grade 1 myometrial invasion was 0.5 cm square myometrium is  1.6 cm in thickness. There was no involvement of other organs lymphovascular invasion was not identified. 3 lymph nodes were negative for metastatic disease. 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  Patient was seen by Dr. [name] on 04/29/2013 with a nodule in the vagina concerning for recurrence as well as 8 cm rectal mass. CT of the abdomen and pelvis showed a lobular mass at the vagina cuff measuring 7.0 x 3.2 cm proximally 4 cm craniocaudal dimension, with compression of the distal aspect of the sigmoid colon just above the rectum . There was also noted to be a large necrotic lymph node measuring 2.8 cm within the sigmoid mesocolon centrally. There are also small bilateral external iliac lymph nodes. There were no aggressive osseous lesions. 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  Patient received 6 cycles of taxol carboplatin from 06/03/2013 thru [id number]//4854, complicated by some peripheral neuropathy. There was an interval decrease in tumor burden therefore Dr. [name] recommended 3 additional cycles of Taxotere/Carboplatin, received from 11/27/13 through 01/15/14.  CT of the chest, abdomen, and pelvis on 02/10/14 demonstrated disease progression. She had radiation by Dr Sondra Come, [place] given from 02/23/14 thru 03-27-14. PET was obtained in September 2015. That demonstrates highly metabolic activity in the central pelvis involving the sigmoid colon, with rectovaginal mass confirmed on exam by gyn oncology.    Review of systems as above, also: No SOB or  significant cough. Not vomiting. No other bleeding. No problems with PAC. No LE swelling. Difficulty sleeping with other discomfort. Remainder of 10 point Review of Systems negative.  Objective:  Vital signs in last 24 hours:  BP 147/69   Pulse 81  Temp(Src) 97.4 F (36.3 C) (Oral)  Resp 20  Ht 5' (1.524 m)  Wt 123 lb (55.792 kg)  BMI 24.02 kg/m2 Weight is down 2 lbs. Alert, oriented and appropriate. Ambulatory without assistance. Looks mildly uncomfortable, several trips to [place] while at office, but NAD. Respirations not labored RA. No alopecia  HEENT:PERRL, sclerae not icteric. Oral mucosa moist without lesions, posterior pharynx clear.  Neck supple. No JVD.  Lymphatics:no cervical,suraclavicular, axillary or inguinal adenopathy Resp: clear to auscultation bilaterally and normal percussion bilaterally Cardio: regular rate and rhythm. No gallop. GI: soft, nontender, not more distended, no mass or organomegaly. A few bowel sounds. Surgical incision not remarkable. Musculoskeletal/ Extremities: without pitting edema, cords, tenderness Neuro:  nonfocal   PSYCH appropriate mood and affect Skin without rash, ecchymosis, petechiae Portacath-without erythema or tenderness  Lab Results:  Results for orders placed in visit on 08/19/14  CBC WITH DIFFERENTIAL      Result Value Ref Range   WBC 8.0  3.9 - 10.3 10e3/uL   NEUT# 6.2  1.5 - 6.5 10e3/uL   HGB 11.0 (*) 11.6 - 15.9 g/dL   HCT 33.3 (*) 34.8 - 46.6 %   Platelets 244  145 - 400 10e3/uL   MCV 95.2  79.5 - 101.0 fL   MCH 31.4  25.1 - 34.0 pg   MCHC 33.0  31.5 - 36.0 g/dL   RBC 3.50 (*) 3.70 - 5.45 10e6/uL   RDW 13.2  11.2 - 14.5 %   lymph# 1.1  0.9 - 3.3 10e3/uL   MONO# 0.5  0.1 - 0.9 10e3/uL   Eosinophils Absolute 0.1  0.0 - 0.5 10e3/uL   Basophils Absolute 0.0  0.0 - 0.1 10e3/uL   NEUT% 78.5 (*) 38.4 - 76.8 %   LYMPH% 13.6 (*) 14.0 - 49.7 %   MONO% 6.8  0.0 - 14.0 %   EOS% 0.8  0.0 - 7.0 %   BASO% 0.3  0.0 - 2.0 %  COMPREHENSIVE METABOLIC PANEL (IO27)      Result Value Ref Range   Sodium 142  136 - 145 mEq/L   Potassium 3.5  3.5 - 5.1 mEq/L   Chloride 101  98 - 109 mEq/L   CO2 31 (*) 22 - 29 mEq/L   Glucose 124  70 - 140 mg/dl   BUN 10.5  7.0 - 26.0 mg/dL    Creatinine 0.8  0.6 - 1.1 mg/dL   Total Bilirubin 0.37  0.20 - 1.20 mg/dL   Alkaline Phosphatase 77  40 - 150 U/L   AST 14  5 - 34 U/L   ALT 8  0 - 55 U/L   Total Protein 7.0  6.4 - 8.3 g/dL   Albumin 3.4 (*) 3.5 - 5.0 g/dL   Calcium 10.9 (*) 8.4 - 10.4 mg/dL   Anion Gap 10  3 - 11 mEq/L  URINALYSIS, MICROSCOPIC - [facility]      Result Value Ref Range   Glucose Negative  Negative mg/dL   Bilirubin (Urine) Negative  Negative   Ketones 5  Negative mg/dL   Specific Gravity, Urine 1.005  1.003 - 1.035   Blood Trace  Negative   pH 7.0  4.6 - 8.0   Protein Negative  Negative- <  30 mg/dL   Urobilinogen, UR 0.2  0.2 - 1 mg/dL   Nitrite Negative  Negative   Leukocyte Esterase Negative  Negative   RBC / HPF 0-2  0 - 2   WBC, UA Negative  0 - 2   Epithelial Cells Occasional  Negative- Few   Urine culture pending, this + UA due to dysuria complaints  Studies/Results:  SHe was sent from office for abd Xrays at [facility]:   ABDOMEN - 2 VIEW    08-20-14 COMPARISON: None.  FINDINGS:  There is no bowel dilatation to suggest obstruction. There are no  air-fluid levels. There is no evidence of pneumoperitoneum, portal  venous gas or pneumatosis. There are no pathologic calcifications  along the expected course of the ureters.  Posterior lumbar interbody fusion at L4-5.  IMPRESSION:  No bowel obstruction.   Medications: I have reviewed the patient's current medications. Continue fentanyl patch for now, stop oxycontin 10 mg bid, continue prn oxycodone. DIscussed fact that narcotic worsen constipation issues, so may be best to get some solution to bowel situation rather than just increasing narcotics. Increase miralax to tid.  DISCUSSION: lengthy discussion with patient alone and again together with daughters concerning what seems to be near obstruction of distal large bowel/ rectum from the recurrent cancer in pelvis. Discussed pelvic exenteration, which would include urinary diversion and colostomy, and  she will talk further with Dr [name] about this at visit 08-21-14. I did mention diverting colostomy alone if TPE not appropriate or not desired. She understands that we could try more chemo, tho chance of benefit low and obviously she would not be able to tolerate this well given present GI problems. Med changes as above. Following visit Dr Blanch Media RN let me know that he will work patient in at his office on 08-20-14 - thank you!  Assessment/Plan: 1.endometrial carcinoma IA, grade 1 with no high risk features at surgery 12-2011, then recurrent to vagina 04-2013 and progressive in pelvis despite carboplatin/taxane x 9 cycles thru 01-15-2014. RT completed 03-27-14, clinically improved initially, residual findings on CT and gyn onc exam in [place] recently more pelvic discomfort and constipation. PET with disease activity in previous sites in pelvis. Symptoms suggest partial distal bowel obstruction. Appreciate Dr Blanch Media follow up, possibly to consider flex sig to document degree of involvment, considering TPE by gyn onc, or possibly could benefit from diverting colostomy if no TPE. I will see her back in ~ 2 weeks, tho certainly can change this appointment if other interventions. 2.PAC in, flushed 07-22-14  3.multifactoria anemia: hemoglobin fairly stable and not symptomatic today  4.diabetes, HTN,hx nephrolithiasis, left breast biopsies  5.flu vaccine given   6.hypokalemia: mild, trying to address with diet.   Patient and family seemed to follow discussion well and had all questions answered to best of my ability.  We will let her know that abdominal Xray does not show full obstruction, does seem to have significant volume of stool thru bowels by my review.  Time spent 35 min including >50% counseling and coordination of care.   Reggie Welge P, MD   08/19/2014, 3:53 PM

## 2014-08-19 NOTE — Telephone Encounter (Signed)
per pof to sch pt appt-gave pt copy of sch °

## 2014-08-19 NOTE — Patient Instructions (Signed)
Increase miralax to 3x daily  Stop the long lasting oxycodone since you are on patch now

## 2014-08-19 NOTE — Telephone Encounter (Signed)
Received call from Dr. [name]. Pt seen today and per Dr. [name] pt is nearly obstructed in her bowels down low. Requesting pt have a Flex-sig to see how obstructed she is, has colon scheduled for 09/08/14.  Dr. Camila Li pager number is [phone].

## 2014-08-19 NOTE — Telephone Encounter (Signed)
Called pt to obtain more information regarding her symptoms. Left message for pt to call back.

## 2014-08-19 NOTE — Telephone Encounter (Signed)
Spoke with Dr. [name] and she is aware that Dr. [name] will see the pt tomorrow. Pt aware of appt also.

## 2014-08-19 NOTE — Telephone Encounter (Signed)
Linda, No obstruction on today's x-ray. Have her see me in the office tomorrow afternoon. We will go from there.

## 2014-08-20 ENCOUNTER — Telehealth: Payer: Self-pay

## 2014-08-20 ENCOUNTER — Encounter: Payer: Self-pay | Admitting: Internal Medicine

## 2014-08-20 ENCOUNTER — Ambulatory Visit (INDEPENDENT_AMBULATORY_CARE_PROVIDER_SITE_OTHER): Payer: Medicare Other | Admitting: Internal Medicine

## 2014-08-20 VITALS — BP 156/80 | HR 68 | Ht 60.0 in | Wt 120.2 lb

## 2014-08-20 DIAGNOSIS — R1084 Generalized abdominal pain: Secondary | ICD-10-CM

## 2014-08-20 DIAGNOSIS — C541 Malignant neoplasm of endometrium: Secondary | ICD-10-CM

## 2014-08-20 DIAGNOSIS — K5900 Constipation, unspecified: Secondary | ICD-10-CM

## 2014-08-20 DIAGNOSIS — M25559 Pain in unspecified hip: Secondary | ICD-10-CM

## 2014-08-20 LAB — URINE CULTURE

## 2014-08-20 NOTE — Telephone Encounter (Signed)
Message copied by Baruch Merl on Thu Aug 20, 2014  5:32 PM ------      Message from: Evlyn Clines P      Created: Thu Aug 20, 2014  4:24 PM       Labs seen and need follow up: please let her know no UTI ------

## 2014-08-20 NOTE — Progress Notes (Signed)
HISTORY OF PRESENT ILLNESS:  Pam Vazquez is a 68 y.o. female who was evaluated 3 weeks ago for constipation and lower abdominal pain. See that dictation. Her lower abdominal pain was felt secondary to a combination of locally recurrent endometrial carcinoma and constipation exacerbated by narcotics. She was instructed to initiate MiraLax therapy and titrate to effect. As well, colonoscopy scheduled (previous colonoscopy October 2007 was normal. The patient was seen yesterday by her oncologist. Because of ongoing complaints of pain and unsatisfactory bowel movements, a concern for bowel obstruction developed. She did undergo plain films of the abdomen which did not reveal any evidence for obstruction or excessive fecal load. She was told increase her MiraLax to 3 times daily. This appointment given. The patient tells me that she has significant problems with pain involving the lower back, the vagina, and rectum. Fullness and pain in the pelvic region. Less problems with true abdominal pain. She denies nausea, vomiting, or abdominal distention. She is having difficulty sleeping because of the pain. Her narcotics have been adjusted. She is having daily bowel movements, though small and formed multiple times per day. No bleeding. She is to see her gynecologic oncologist tomorrow and [facility]. Blood work from yesterday was stable with hemoglobin of 11, white blood cell count 8.0. Slightly elevated calcium of 10.9 and decreased albumin at 3.4.  REVIEW OF SYSTEMS:  All non-GI ROS negative except for back pain, vaginal pain, fatigue, night sweats, urinary frequency, sleeping problems  Past Medical History  Diagnosis Date  . Diabetes mellitus   . PONV (postoperative nausea and vomiting)   . Cancer     endometrial  . Angina     stress test on chart from 3/12/ none since March 2012  . Chronic kidney disease     kidney stones  . Hypertension     / EKG 1/13 Epic  . Radiation 02/23/14-03/27/14    45 gray  to pelvic mass  . Glaucoma     Past Surgical History  Procedure Laterality Date  . Hysteroscopy w/d&c  11/21/2011    Procedure: DILATATION AND CURETTAGE /HYSTEROSCOPY;  Surgeon: Gus Height;  Location: [facility];  Service: Gynecology;  Laterality: N/A;  . Tubal ligation    . Breast mass removal  1984, 1992    L breast  . Lithotripsy  2000, 2002  . Back surgery      91 and 2001  . Dilation and curettage of uterus    . Node dissection  12/19/2011    Procedure: NODE DISSECTION;  Surgeon: [name], MD PHD;  Location: [facility];  Service: Gynecology;  Laterality: N/A;  . Robotic assisted total hysterectomy with bilateral salpingo oopherectomy  12/19/2011    robotic-assisted total laparoscopic hysterectomy bilateral salpingo-oophorectomy right pelvic lymph node biopsy on 12/19/2011 h    Social History BREXLEY CUTSHAW  reports that she quit smoking about 28 years ago. She has never used smokeless tobacco. She reports that she does not drink alcohol or use illicit drugs.  family history includes Diabetes in her mother; Heart attack in her mother; Heart disease in her mother; Stomach cancer in her maternal grandmother.  No Known Allergies     PHYSICAL EXAMINATION: Vital signs: BP 156/80  Pulse 68  Ht 5' (1.524 m)  Wt 120 lb 3.2 oz (54.522 kg)  BMI 23.47 kg/m2 General: Well-developed, well-nourished, no acute distress HEENT: Sclerae are anicteric, conjunctiva pink. Oral mucosa intact Lungs: Clear Heart: Regular Abdomen: soft, nontender, nondistended, no obvious ascites, no peritoneal  signs, normal bowel sounds. No organomegaly. Extremities: No edema Psychiatric: alert and oriented x3. Cooperative     ASSESSMENT:  #1. Pelvic pain secondary to recurrent endometrial carcinoma #2. Constipation secondary to narcotics #3. No evidence for bowel obstruction at this time, though given the problem of hand, she is certainly at risk   PLAN:  #1. Increase MiraLax as much as needed to achieve  desired effect. #2. Keep plans for colonoscopy. Offered sooner appointment but she has plans for family vacation October 11 through October 18. We will keep previously scheduled appointment for October 27. #3. I discussed with her symptoms it may suggest bowel obstruction including distention, associated pain, and vomiting. She has been instructed to contact the office and/or go to the hospital immediately should these problems develop

## 2014-08-20 NOTE — Patient Instructions (Signed)
As discussed with Dr. [name], increase your Miralax as needed.    Keep the appointment for your upcoming colonosocopy

## 2014-08-20 NOTE — Telephone Encounter (Signed)
Told Pam Vazquez that she did not have a UTI from the urine culture sent yesterday as noted below by Dr. [name].  Pam Vazquez verbalized understanding.

## 2014-09-01 ENCOUNTER — Other Ambulatory Visit: Payer: Self-pay | Admitting: Oncology

## 2014-09-02 ENCOUNTER — Other Ambulatory Visit (HOSPITAL_BASED_OUTPATIENT_CLINIC_OR_DEPARTMENT_OTHER): Payer: Medicare Other

## 2014-09-02 ENCOUNTER — Encounter: Payer: Self-pay | Admitting: Oncology

## 2014-09-02 ENCOUNTER — Ambulatory Visit (HOSPITAL_BASED_OUTPATIENT_CLINIC_OR_DEPARTMENT_OTHER): Payer: Medicare Other | Admitting: Oncology

## 2014-09-02 VITALS — BP 127/68 | HR 69 | Temp 98.8°F | Resp 18 | Ht 60.0 in | Wt 124.4 lb

## 2014-09-02 DIAGNOSIS — E119 Type 2 diabetes mellitus without complications: Secondary | ICD-10-CM

## 2014-09-02 DIAGNOSIS — D6481 Anemia due to antineoplastic chemotherapy: Secondary | ICD-10-CM

## 2014-09-02 DIAGNOSIS — N200 Calculus of kidney: Secondary | ICD-10-CM

## 2014-09-02 DIAGNOSIS — C541 Malignant neoplasm of endometrium: Secondary | ICD-10-CM

## 2014-09-02 DIAGNOSIS — T451X5A Adverse effect of antineoplastic and immunosuppressive drugs, initial encounter: Secondary | ICD-10-CM

## 2014-09-02 DIAGNOSIS — I10 Essential (primary) hypertension: Secondary | ICD-10-CM

## 2014-09-02 LAB — COMPREHENSIVE METABOLIC PANEL (CC13)
ALT: 11 U/L (ref 0–55)
ANION GAP: 9 meq/L (ref 3–11)
AST: 14 U/L (ref 5–34)
Albumin: 3.3 g/dL — ABNORMAL LOW (ref 3.5–5.0)
Alkaline Phosphatase: 64 U/L (ref 40–150)
BUN: 23.7 mg/dL (ref 7.0–26.0)
CALCIUM: 10.4 mg/dL (ref 8.4–10.4)
CHLORIDE: 110 meq/L — AB (ref 98–109)
CO2: 27 meq/L (ref 22–29)
CREATININE: 1 mg/dL (ref 0.6–1.1)
Glucose: 133 mg/dl (ref 70–140)
Potassium: 3.8 mEq/L (ref 3.5–5.1)
Sodium: 145 mEq/L (ref 136–145)
Total Bilirubin: 0.27 mg/dL (ref 0.20–1.20)
Total Protein: 6.5 g/dL (ref 6.4–8.3)

## 2014-09-02 LAB — CBC WITH DIFFERENTIAL/PLATELET
BASO%: 0.2 % (ref 0.0–2.0)
Basophils Absolute: 0 10*3/uL (ref 0.0–0.1)
EOS%: 1.2 % (ref 0.0–7.0)
Eosinophils Absolute: 0.1 10*3/uL (ref 0.0–0.5)
HEMATOCRIT: 33.5 % — AB (ref 34.8–46.6)
HGB: 10.7 g/dL — ABNORMAL LOW (ref 11.6–15.9)
LYMPH#: 1.7 10*3/uL (ref 0.9–3.3)
LYMPH%: 19 % (ref 14.0–49.7)
MCH: 31.1 pg (ref 25.1–34.0)
MCHC: 31.9 g/dL (ref 31.5–36.0)
MCV: 97.4 fL (ref 79.5–101.0)
MONO#: 0.6 10*3/uL (ref 0.1–0.9)
MONO%: 6.6 % (ref 0.0–14.0)
NEUT#: 6.6 10*3/uL — ABNORMAL HIGH (ref 1.5–6.5)
NEUT%: 73 % (ref 38.4–76.8)
Platelets: 251 10*3/uL (ref 145–400)
RBC: 3.44 10*6/uL — ABNORMAL LOW (ref 3.70–5.45)
RDW: 14.1 % (ref 11.2–14.5)
WBC: 9.1 10*3/uL (ref 3.9–10.3)

## 2014-09-02 NOTE — Progress Notes (Signed)
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 Treatment decisions are still in process.  She is much more comfortable now on duragesic 50 mcg every 72 hrs with prn oxycodone and miralax bid. She needed 6 oxycodone tablets on 10-19, 5 tablets on 10-20 and has used 2 today by this 4 PM visit. Bowels are moving several times daily, soft stool in larger amounts than previously. Appetite is much better on Megace since 08-21-14.  She met with Dr Nelly Rout and gyn oncology staff at [facility] on 08-21-14 with further discussion of TPE, but tells me that she does not believe that she wants to attempt that. She is to call Dr Nelly Rout to let her know decision when she does make up her mind.  She is to have colonoscopy by Dr Marina Goodell on 10-27; if there is significant compression or involvement of lower bowel, she may still want to consider diverting colostomy.  She has PAC She had flu vaccine  She and family enjoyed trip to beach last week as planned.   ONCOLOGIC HISTORY Patient had robotic assisted total laparoscopic hysterectomy bilateral salpingo-oophorectomy right pelvic lymph node biopsy on 12/19/2011. 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 There was no involvement of other organs lymphovascular invasion was not identified. 3 lymph nodes were negative for metastatic disease. 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  Patient was seen by Dr. Nelly Rout on 04/29/2013 with a nodule in the vagina concerning for recurrence as well as 8 cm rectal mass. CT of the abdomen and  pelvis showed a lobular mass at the vagina cuff measuring 7.0 x 3.2 cm proximally 4 cm craniocaudal dimension, with compression of the distal aspect of the sigmoid colon just above the rectum . There was also noted to be a large necrotic lymph node measuring 2.8 cm within the sigmoid mesocolon centrally. There are also small bilateral external iliac lymph nodes. There were no aggressive osseous lesions. 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  Patient received 6 cycles of taxol carboplatin from 06/03/2013 thru 11/11//2014, complicated by some peripheral neuropathy. There was an interval decrease in tumor burden therefore Dr. Nelly Rout recommended 3 additional cycles of Taxotere/Carboplatin, received from 11/27/13 through 01/15/14.  CT of the chest, abdomen, and pelvis on 02/10/14 demonstrated disease progression. She had radiation by Dr Roselind Messier, [address] given from 02/23/14 thru 03-27-14. PET was obtained in September 2015. That demonstrates highly metabolic activity in the central pelvis involving the sigmoid colon, with rectovaginal mass confirmed on exam by gyn oncology. She has discussed total pelvic exenteration with Dr Nelly Rout.   Review of systems as above, also: Energy better. No fever, no symptoms of infection. No bleeding. No SOB. No vomiting. No LE swelling Remainder of 10 point Review of Systems negative.  Objective:  Vital signs in last 24 hours:  BP 127/68  Pulse 69  Temp(Src) 98.8 F (37.1 C) (Oral)  Resp 18  Ht 5' (1.524 m)  Wt 124 lb 6.4 oz (56.427 kg)  BMI 24.30 kg/m2 weight up over 4 lbs  Alert, oriented and appropriate. Ambulatory without difficulty.  No alopecia  HEENT:PERRL,  sclerae not icteric. Oral mucosa moist without lesions, posterior pharynx clear.  Neck supple. No JVD.  Lymphatics:no cervical,suraclavicular or inguinal adenopathy Resp: clear to auscultation bilaterally and normal percussion  bilaterally Cardio: regular rate and rhythm. No gallop. GI: soft, nontender, not distended, no mass or organomegaly. Some bowel sounds. Laparoscopic surgical incisions not remarkable. Musculoskeletal/ Extremities: without pitting edema, cords, tenderness Neuro: no change peripheral neuropathy. Otherwise nonfocal. PSYCH mood and affect appropriate Skin without rash, ecchymosis, petechiae Portacath-without erythema or tenderness  Lab Results:  Results for orders placed in visit on 09/02/14  CBC WITH DIFFERENTIAL      Result Value Ref Range   WBC 9.1  3.9 - 10.3 10e3/uL   NEUT# 6.6 (*) 1.5 - 6.5 10e3/uL   HGB 10.7 (*) 11.6 - 15.9 g/dL   HCT 54.4 (*) 87.5 - 16.0 %   Platelets 251  145 - 400 10e3/uL   MCV 97.4  79.5 - 101.0 fL   MCH 31.1  25.1 - 34.0 pg   MCHC 31.9  31.5 - 36.0 g/dL   RBC 6.10 (*) 7.74 - 2.17 10e6/uL   RDW 14.1  11.2 - 14.5 %   lymph# 1.7  0.9 - 3.3 10e3/uL   MONO# 0.6  0.1 - 0.9 10e3/uL   Eosinophils Absolute 0.1  0.0 - 0.5 10e3/uL   Basophils Absolute 0.0  0.0 - 0.1 10e3/uL   NEUT% 73.0  38.4 - 76.8 %   LYMPH% 19.0  14.0 - 49.7 %   MONO% 6.6  0.0 - 14.0 %   EOS% 1.2  0.0 - 7.0 %   BASO% 0.2  0.0 - 2.0 %  COMPREHENSIVE METABOLIC PANEL (CC13)      Result Value Ref Range   Sodium 145  136 - 145 mEq/L   Potassium 3.8  3.5 - 5.1 mEq/L   Chloride 110 (*) 98 - 109 mEq/L   CO2 27  22 - 29 mEq/L   Glucose 133  70 - 140 mg/dl   BUN 82.7  7.0 - 49.7 mg/dL   Creatinine 1.0  0.6 - 1.1 mg/dL   Total Bilirubin 6.80  0.20 - 1.20 mg/dL   Alkaline Phosphatase 64  40 - 150 U/L   AST 14  5 - 34 U/L   ALT 11  0 - 55 U/L   Total Protein 6.5  6.4 - 8.3 g/dL   Albumin 3.3 (*) 3.5 - 5.0 g/dL   Calcium 86.7  8.4 - 38.1 mg/dL   Anion Gap 9  3 - 11 mEq/L     Studies/Results:  Echocardiogram 08-01-2014 had EF 55-60%.  Medications: I have reviewed the patient's current medications. She will continue duragesic at 50 mcg with prn oxycodone. She will continue miralax 2-3x daily and  megace  DISCUSSION: patient tells me that she would like to try chemotherapy in preference to TPE, may consider doxil if so. She will have colonoscopy as scheduled first, but with symptoms improved on present meds may be able to avoid surgery now. I will see her back after colonoscopy. I believe that she still needs doxil teaching if we decide to use that.  Assessment/Plan:  1.endometrial carcinoma IA, grade 1 with no high risk features at surgery 12-2011, then recurrent to vagina 04-2013 and progressive in pelvis despite carboplatin/taxane x 9 cycles thru 01-15-2014. RT completed 03-27-14, clinically improved initially, residual findings on CT and gyn onc exam in July,and recently more pelvic discomfort and constipation. PET with disease activity in previous sites in pelvis.  Symptoms initially suggested partial distal bowel obstruction, but are better with present meds including bid miralax. For colonoscopy next week, as diverting colostomy might need to be considered. She probably will not want total pelvic exenteration. May try salvage doxil.  2.PAC in, flushed 07-22-14, needs flush early Nov if now used prior 3.multifactoria anemia: hemoglobin fairly stable and not symptomatic today  4.diabetes, HTN,hx nephrolithiasis, left breast biopsies  5.flu vaccine given  6.hypokalemia resolved 7.advance directives in place 8.good EF by echocardiogram 07-23-14   All questions answered. Patient and daughter are in agreement with plans above. Time spent 30 min including >50% counseling and coordination of care. CC this note to PCP Dr Scotty Court, Dr [name] and Dr [name].  Oakes Mccready P, MD   09/02/2014, 4:40 PM

## 2014-09-03 ENCOUNTER — Telehealth: Payer: Self-pay | Admitting: Oncology

## 2014-09-04 ENCOUNTER — Telehealth: Payer: Self-pay | Admitting: Oncology

## 2014-09-07 ENCOUNTER — Telehealth: Payer: Self-pay | Admitting: Gynecologic Oncology

## 2014-09-07 NOTE — Telephone Encounter (Signed)
Patient called stating "I am supposed to give Dr. [name] an answer about surgery but I want to wait until I have my colonoscopy to see what that shows."  She is to call the office with an update.

## 2014-09-08 ENCOUNTER — Encounter: Payer: Self-pay | Admitting: Internal Medicine

## 2014-09-08 ENCOUNTER — Ambulatory Visit (AMBULATORY_SURGERY_CENTER): Payer: Medicare Other | Admitting: Internal Medicine

## 2014-09-08 VITALS — BP 141/69 | HR 103 | Temp 98.6°F | Resp 14 | Ht 60.0 in | Wt 120.0 lb

## 2014-09-08 DIAGNOSIS — M25559 Pain in unspecified hip: Secondary | ICD-10-CM

## 2014-09-08 DIAGNOSIS — K5900 Constipation, unspecified: Secondary | ICD-10-CM | POA: Diagnosis not present

## 2014-09-08 DIAGNOSIS — R1084 Generalized abdominal pain: Secondary | ICD-10-CM

## 2014-09-08 DIAGNOSIS — K621 Rectal polyp: Secondary | ICD-10-CM | POA: Diagnosis not present

## 2014-09-08 DIAGNOSIS — K6389 Other specified diseases of intestine: Secondary | ICD-10-CM

## 2014-09-08 MED ORDER — SODIUM CHLORIDE 0.9 % IV SOLN: 500.0000 mL | INTRAVENOUS | Status: DC

## 2014-09-08 NOTE — Progress Notes (Signed)
Report to PACU, RN, vss, BBS= Clear.  

## 2014-09-08 NOTE — Op Note (Signed)
[place]  [address] [place], [id number]   COLONOSCOPY PROCEDURE REPORT  PATIENT: Pam, Vazquez  MR#: [id number] BIRTHDATE: [birth year] , 52  yrs. old GENDER: female ENDOSCOPIST: Eustace Quail, MD REFERRED EM:[id number] [name], M.D. PROCEDURE DATE:  09/08/2014 PROCEDURE:   Colonoscopy with biopsy (incomplete exam due to obstruction) First Screening Colonoscopy - Avg.  risk and is 50 yrs.  old or older - No.  Prior Negative Screening - Now for repeat screening. N/A  History of Adenoma - Now for follow-up colonoscopy & has been > or = to 3 yrs.  N/A  Polyps Removed Today? No.  Recommend repeat exam, <10 yrs? No. ASA CLASS:   Class III INDICATIONS:change in bowel habits.  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  The digital rectal exam revealed no abnormalities of the rectum.   The LB [id number]  endoscope was introduced through the anus and advanced to the rectum. No adverse events experienced.   Limited by an obstruction.   The quality of the prep was excellent, using MoviPrep  The instrument was then slowly withdrawn as the colon was fully examined.      COLON FINDINGS: The pediatric colonoscope was advanced approximately 15 cm to the proximal rectum.  There was extrinsic compression with evidence of invasive tumor and high-grade obstruction.  The endoscope was not passed beyond.  Biopsies taken.  Retroflexed views revealed no abnormalities. The time to cecum=  .  Withdrawal time=  .  The scope was withdrawn and the procedure completed.  COMPLICATIONS: There were no immediate complications.  ENDOSCOPIC IMPRESSION: 1. High-grade obstruction at the level of the proximal rectum with tumor invasion 2. Incomplete  examination secondary to obstruction  RECOMMENDATIONS: 1. Follow-up biopsies 2. Return to Dr. [name] .  Will likely need to be referred for diverting procedure 3. Continue MiraLAX daily to achieve 1-2 bowel movements. If you develop distention and nausea/vomiting, proceed to the emergency room  eSigned:  Eustace Quail, MD 09/08/2014 3:30 PM   cc: Evlyn Clines, MD and The Patient

## 2014-09-08 NOTE — Patient Instructions (Signed)
YOU HAD AN ENDOSCOPIC PROCEDURE TODAY AT THE [facility]: Refer to the procedure report that was given to you for any specific questions about what was found during the examination.  If the procedure report does not answer your questions, please call your gastroenterologist to clarify.  If you requested that your care partner not be given the details of your procedure findings, then the procedure report has been included in a sealed envelope for you to review at your convenience later.  YOU SHOULD EXPECT: Some feelings of bloating in the abdomen. Passage of more gas than usual.  Walking can help get rid of the air that was put into your GI tract during the procedure and reduce the bloating. If you had a lower endoscopy (such as a colonoscopy or flexible sigmoidoscopy) you may notice spotting of blood in your stool or on the toilet paper. If you underwent a bowel prep for your procedure, then you may not have a normal bowel movement for a few days.  DIET: Your first meal following the procedure should be a light meal and then it is ok to progress to your normal diet.  A half-sandwich or bowl of soup is an example of a good first meal.  Heavy or fried foods are harder to digest and may make you feel nauseous or bloated.  Likewise meals heavy in dairy and vegetables can cause extra gas to form and this can also increase the bloating.  Drink plenty of fluids but you should avoid alcoholic beverages for 24 hours.  ACTIVITY: Your care partner should take you home directly after the procedure.  You should plan to take it easy, moving slowly for the rest of the day.  You can resume normal activity the day after the procedure however you should NOT DRIVE or use heavy machinery for 24 hours (because of the sedation medicines used during the test).    SYMPTOMS TO REPORT IMMEDIATELY: A gastroenterologist can be reached at any hour.  During normal business hours, 8:30 AM to 5:00 PM Monday through Friday,  call [phone].  After hours and on weekends, please call the GI answering service at [phone] who will take a message and have the physician on call contact you.   Following lower endoscopy (colonoscopy or flexible sigmoidoscopy):  Excessive amounts of blood in the stool  Significant tenderness or worsening of abdominal pains  Swelling of the abdomen that is new, acute  Fever of 100F or higher  FOLLOW UP: If any biopsies were taken you will be contacted by phone or by letter within the next 1-3 weeks.  Call your gastroenterologist if you have not heard about the biopsies in 3 weeks.  Our staff will call the home number listed on your records the next business day following your procedure to check on you and address any questions or concerns that you may have at that time regarding the information given to you following your procedure. This is a courtesy call and so if there is no answer at the home number and we have not heard from you through the emergency physician on call, we will assume that you have returned to your regular daily activities without incident.  SIGNATURES/CONFIDENTIALITY: You and/or your care partner have signed paperwork which will be entered into your electronic medical record.  These signatures attest to the fact that that the information above on your After Visit Summary has been reviewed and is understood.  Full responsibility of the confidentiality of this   discharge information lies with you and/or your care-partner.  Resume medications. 

## 2014-09-08 NOTE — Progress Notes (Signed)
Called to room to assist during endoscopic procedure.  Patient ID and intended procedure confirmed with present staff. Received instructions for my participation in the procedure from the performing physician.  

## 2014-09-08 NOTE — Progress Notes (Signed)
Pt. Denied pain upon admission to recovery. After doctor in to speak with pt. And family daughter stated that she has to use bathroom(void). Pt. Placed on bedpan and was unable to use ,but stated that she hurt and she don't know sometimes what it is coming from, she stated that she hurts like that sometime when she has to go to bathroom. Pt. Then assisted to bathroom and was able to void and after that the pain was relieved.

## 2014-09-09 ENCOUNTER — Telehealth: Payer: Self-pay | Admitting: *Deleted

## 2014-09-09 NOTE — Telephone Encounter (Signed)
No answer. No message machine. No message left.

## 2014-09-10 ENCOUNTER — Other Ambulatory Visit: Payer: Self-pay | Admitting: Oncology

## 2014-09-10 ENCOUNTER — Telehealth: Payer: Self-pay | Admitting: Oncology

## 2014-09-10 NOTE — Telephone Encounter (Signed)
Medical Oncology  Telephone call to [facility] requesting new patient evaluation for near complete rectal obstruction, for consideration of diverting colostomy. Triage requested that I speak directly with surgeon doing acute care at [facility]. Case discussed with Dr Zella Richer, who agrees that prompt outpatient evaluation appropriate as she is not completely obstructed. He will have office set up and they will let me know directly.  [name], MD

## 2014-09-10 NOTE — Telephone Encounter (Signed)
Medical Oncology  Call received from [facility] to let me know that patient is scheduled to see Dr Alphonsa Overall on 09-11-14 @ 3:15. They suggest she continue clear liquids only.  I spoke directly with patient now, with this information and office address and contact #. Reviewed clear liquids only. She is in agreement with plan and appreciative of this appointment. She knows to call if concerns prior to seeing surgeon.  I will let Drs [name] and [name] know.  [name], MD

## 2014-09-10 NOTE — Telephone Encounter (Signed)
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 Note patient has not called Dr [name] re her decision about that possible surgery by gyn oncology.  I called patient now. She is aware of findings at colonoscopy. She does not want total pelvic exenteration, but is very willing to have diverting colostomy. Last good bowel movement was AM of colonoscopy (09-08-14). She has had no bowel movement since then, but is passing flatus. She is not in acute discomfort. I told her clear liquids only until we speak back with her. She would like referral to [facility] Surgery, does not know any general surgeons in that group. She understands that I will speak with general surgery now and that we will let her know what to do from here. Patient understands and agrees.  [name], MD

## 2014-09-14 ENCOUNTER — Telehealth: Payer: Self-pay

## 2014-09-14 DIAGNOSIS — C541 Malignant neoplasm of endometrium: Secondary | ICD-10-CM

## 2014-09-14 DIAGNOSIS — G893 Neoplasm related pain (acute) (chronic): Secondary | ICD-10-CM

## 2014-09-14 MED ORDER — FENTANYL 50 MCG/HR TD PT72: 50.0000 ug | MEDICATED_PATCH | TRANSDERMAL | Status: DC

## 2014-09-14 NOTE — Telephone Encounter (Addendum)
Tod Pam Vazquez that Dr. [name] said to keep patch at 50 mc and use more of the Oxycodone. The prescription will be up front for her daughter to pick up by 1600 today. Pam Vazquez has plenty of Oxycodone until her appointment with Dr. [name] on 09-16-14. Her surgery has not been scheduled.  The Surgeon said on Friday 09-11-14 that hopefully it will  be scheduled with in 2 weeks.

## 2014-09-14 NOTE — Addendum Note (Signed)
Addended by: Baruch Merl on: 09/14/2014 02:55 PM   Modules accepted: Orders

## 2014-09-14 NOTE — Telephone Encounter (Signed)
Ms. Blondin needs a refill on her Duragesic 50 mcg patch. The patch is being changed ~21/2 days.   Taking 2 Oxycodone 10 mg tabs ~7 hours %00 mg Tylenol Tid. Pain level 5-6/10.  She has been moving her bowels more frequently which is good but causing increased pain.   She is on a liquid diet in preporation for surgery. No nausea or vomiting.

## 2014-09-14 NOTE — Telephone Encounter (Signed)
Told Ms. Kittleson that the dietitian has samples of boost and other resource drinks.   Ms. Rape is currently drinking 4/day .  Told her that the dietitian recommended at least 6/day.  Ms. Jernberg said she could do that. There is a price list of supplements at [place] in the sample bag.  Ernestene Kiel dietitian could possibly get some financial aid for her supplements if needed when she sees Dr. [name] 09-16-14.  Pamala Hurry said to page her 11-4 if patient needs assistance.

## 2014-09-15 ENCOUNTER — Encounter: Payer: Self-pay | Admitting: Internal Medicine

## 2014-09-15 ENCOUNTER — Other Ambulatory Visit: Payer: Self-pay | Admitting: Oncology

## 2014-09-16 ENCOUNTER — Other Ambulatory Visit (HOSPITAL_BASED_OUTPATIENT_CLINIC_OR_DEPARTMENT_OTHER): Payer: Medicare Other

## 2014-09-16 ENCOUNTER — Ambulatory Visit (HOSPITAL_BASED_OUTPATIENT_CLINIC_OR_DEPARTMENT_OTHER): Payer: Medicare Other

## 2014-09-16 ENCOUNTER — Other Ambulatory Visit: Payer: Medicare Other

## 2014-09-16 ENCOUNTER — Telehealth: Payer: Self-pay | Admitting: Oncology

## 2014-09-16 ENCOUNTER — Ambulatory Visit (HOSPITAL_BASED_OUTPATIENT_CLINIC_OR_DEPARTMENT_OTHER): Payer: Medicare Other | Admitting: Oncology

## 2014-09-16 ENCOUNTER — Encounter: Payer: Self-pay | Admitting: Oncology

## 2014-09-16 VITALS — BP 127/74 | HR 68 | Temp 98.2°F | Resp 18 | Ht 60.0 in | Wt 121.3 lb

## 2014-09-16 DIAGNOSIS — C541 Malignant neoplasm of endometrium: Secondary | ICD-10-CM

## 2014-09-16 DIAGNOSIS — G893 Neoplasm related pain (acute) (chronic): Secondary | ICD-10-CM

## 2014-09-16 DIAGNOSIS — Z95828 Presence of other vascular implants and grafts: Secondary | ICD-10-CM

## 2014-09-16 LAB — COMPREHENSIVE METABOLIC PANEL (CC13)
ALT: 9 U/L (ref 0–55)
ANION GAP: 9 meq/L (ref 3–11)
AST: 13 U/L (ref 5–34)
Albumin: 3.3 g/dL — ABNORMAL LOW (ref 3.5–5.0)
Alkaline Phosphatase: 55 U/L (ref 40–150)
BILIRUBIN TOTAL: 0.35 mg/dL (ref 0.20–1.20)
BUN: 15 mg/dL (ref 7.0–26.0)
CALCIUM: 10.6 mg/dL — AB (ref 8.4–10.4)
CO2: 26 meq/L (ref 22–29)
CREATININE: 0.9 mg/dL (ref 0.6–1.1)
Chloride: 106 mEq/L (ref 98–109)
Glucose: 124 mg/dl (ref 70–140)
Potassium: 3.9 mEq/L (ref 3.5–5.1)
Sodium: 141 mEq/L (ref 136–145)
Total Protein: 6.7 g/dL (ref 6.4–8.3)

## 2014-09-16 LAB — CBC WITH DIFFERENTIAL/PLATELET
BASO%: 0.1 % (ref 0.0–2.0)
BASOS ABS: 0 10*3/uL (ref 0.0–0.1)
EOS%: 2.1 % (ref 0.0–7.0)
Eosinophils Absolute: 0.2 10*3/uL (ref 0.0–0.5)
HEMATOCRIT: 31.6 % — AB (ref 34.8–46.6)
HEMOGLOBIN: 10.2 g/dL — AB (ref 11.6–15.9)
LYMPH%: 17.3 % (ref 14.0–49.7)
MCH: 31.3 pg (ref 25.1–34.0)
MCHC: 32.3 g/dL (ref 31.5–36.0)
MCV: 96.9 fL (ref 79.5–101.0)
MONO#: 0.8 10*3/uL (ref 0.1–0.9)
MONO%: 9.6 % (ref 0.0–14.0)
NEUT#: 5.7 10*3/uL (ref 1.5–6.5)
NEUT%: 70.9 % (ref 38.4–76.8)
PLATELETS: 208 10*3/uL (ref 145–400)
RBC: 3.26 10*6/uL — ABNORMAL LOW (ref 3.70–5.45)
RDW: 13.9 % (ref 11.2–14.5)
WBC: 8 10*3/uL (ref 3.9–10.3)
lymph#: 1.4 10*3/uL (ref 0.9–3.3)

## 2014-09-16 MED ORDER — SODIUM CHLORIDE 0.9 % IJ SOLN
10.0000 mL | INTRAMUSCULAR | Status: DC | PRN
  Administered 2014-09-16: 10 mL via INTRAVENOUS
  Filled 2014-09-16: qty 10

## 2014-09-16 MED ORDER — HEPARIN SOD (PORK) LOCK FLUSH 100 UNIT/ML IV SOLN
500.0000 [IU] | Freq: Once | INTRAVENOUS | Status: AC
  Administered 2014-09-16: 500 [IU] via INTRAVENOUS
  Filled 2014-09-16: qty 5

## 2014-09-16 MED ORDER — OXYCODONE HCL 10 MG PO TABS: 10.0000 mg | ORAL_TABLET | Freq: Four times a day (QID) | ORAL | Status: DC | PRN

## 2014-09-16 NOTE — Patient Instructions (Signed)

## 2014-09-16 NOTE — Telephone Encounter (Signed)
, °

## 2014-09-16 NOTE — Progress Notes (Signed)
OFFICE PROGRESS NOTE   09/16/2014   Physicians:David Gwenette Greet (PCP [place]), W.Brewster, J.Kinard, S.[name], Scarlette Shorts  INTERVAL HISTORY:  Patient is seen, together with daughter, in follow up recurrent endometrial carcinoma involving pelvis, with near complete rectal obstruction. Patient decided against total pelvic exenteration, and plan is for diverting colostomy by Dr [name] upcoming. Patient met Dr [name] on 09-11-14; I have spoke with surgery scheduler at his office now, some problem with order in the new computer system there but scheduling now in process. Patient continues full liquid diet, drinking 4-6 supplements daily. Bowels are moving with miralax. She still has some low rectal pressure, but is adequately comfortable with present duragesic 50 mcg q 72 hrs + oxyIR 10 mg total 6 tablets every 24 hrs. She denies abdominal pain, nausea or vomiting.   She has PAC, flushed today. She had flu vaccine She brought copies of Advance Directives and HCPOA now, these to be scanned into EMR. HCPOAs are daughters Airline pilot and Izell Istachatta.Elonda Husky.   Dr Marcene Duos, who cared for her late husband x 11 years, called patient this week, which she appreciated very much - patient crying as she told me about his call.   ONCOLOGIC HISTORY Patient had robotic assisted total laparoscopic hysterectomy bilateral salpingo-oophorectomy right pelvic lymph node biopsy on 12/19/2011. 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 There was no involvement of other organs lymphovascular invasion was not identified. 3 lymph nodes were negative for metastatic disease. 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  Patient was seen by Dr. [name] on 04/29/2013 with a nodule  in the vagina concerning for recurrence as well as 8 cm rectal mass. CT of the abdomen and pelvis showed a lobular mass at the vagina cuff measuring 7.0 x 3.2 cm proximally 4 cm craniocaudal dimension, with compression of the distal aspect of the sigmoid colon just above the rectum . There was also noted to be a large necrotic lymph node measuring 2.8 cm within the sigmoid mesocolon centrally. There are also small bilateral external iliac lymph nodes. There were no aggressive osseous lesions. 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  Patient received 6 cycles of taxol carboplatin from 06/03/2013 thru 50/93//2671, complicated by some peripheral neuropathy. There was an interval decrease in tumor burden therefore Dr. [name] recommended 3 additional cycles of Taxotere/Carboplatin, received from 11/27/13 through 01/15/14.  CT of the chest, abdomen, and pelvis on 02/10/14 demonstrated disease progression. She had radiation by Dr Sondra Come, [place] given from 02/23/14 thru 03-27-14. PET was obtained in September 2015. That demonstrates highly metabolic activity in the central pelvis involving the sigmoid colon, with rectovaginal mass confirmed on exam by gyn oncology. Patient declined total pelvic exenteration. Colonoscopy 09-08-14 confirmed near complete rectal obstruction from external mass. Plan is for diverting colostomy.   Review of systems as above, also: No fever or symptoms of infection. No SOB or cough. No problems with PAC. No LE swelling. No bleeding. She does not feel hungry. Remainder of 10 point Review of Systems negative.  Objective:  Vital signs in last 24 hours:  BP 127/74 mmHg  Pulse 68  Temp(Src) 98.2 F (36.8 C) (Oral)  Resp 18  Ht 5' (1.524 m)  Wt 121 lb 4.8 oz (55.021 kg)  BMI  23.69 kg/m2 Weight up 1 lb. Alert, oriented and appropriate. Ambulatory without difficulty. NAD. No alopecia  HEENT:PERRL, sclerae not icteric.  Oral mucosa moist without lesions, posterior pharynx clear.  Neck supple. No JVD.  Lymphatics:no cervical,supraclavicular, or inguinal adenopathy Resp: clear to auscultation bilaterally and normal percussion bilaterally Cardio: regular rate and rhythm. No gallop. GI: soft, nontender, not distended, no mass or organomegaly. Some bowel sounds. Surgical incision not remarkable Musculoskeletal/ Extremities: without pitting edema, cords, tenderness Neuro: no peripheral neuropathy. Otherwise nonfocal Skin without rash, ecchymosis, petechiae Portacath-without erythema or tenderness  Lab Results:  Results for orders placed or performed in visit on 09/16/14  CBC with Differential  Result Value Ref Range   WBC 8.0 3.9 - 10.3 10e3/uL   NEUT# 5.7 1.5 - 6.5 10e3/uL   HGB 10.2 (L) 11.6 - 15.9 g/dL   HCT 31.6 (L) 34.8 - 46.6 %   Platelets 208 145 - 400 10e3/uL   MCV 96.9 79.5 - 101.0 fL   MCH 31.3 25.1 - 34.0 pg   MCHC 32.3 31.5 - 36.0 g/dL   RBC 3.26 (L) 3.70 - 5.45 10e6/uL   RDW 13.9 11.2 - 14.5 %   lymph# 1.4 0.9 - 3.3 10e3/uL   MONO# 0.8 0.1 - 0.9 10e3/uL   Eosinophils Absolute 0.2 0.0 - 0.5 10e3/uL   Basophils Absolute 0.0 0.0 - 0.1 10e3/uL   NEUT% 70.9 38.4 - 76.8 %   LYMPH% 17.3 14.0 - 49.7 %   MONO% 9.6 0.0 - 14.0 %   EOS% 2.1 0.0 - 7.0 %   BASO% 0.1 0.0 - 2.0 %  Comprehensive metabolic panel (Cmet) - [facility]  Result Value Ref Range   Sodium 141 136 - 145 mEq/L   Potassium 3.9 3.5 - 5.1 mEq/L   Chloride 106 98 - 109 mEq/L   CO2 26 22 - 29 mEq/L   Glucose 124 70 - 140 mg/dl   BUN 15.0 7.0 - 26.0 mg/dL   Creatinine 0.9 0.6 - 1.1 mg/dL   Total Bilirubin 0.35 0.20 - 1.20 mg/dL   Alkaline Phosphatase 55 40 - 150 U/L   AST 13 5 - 34 U/L   ALT 9 0 - 55 U/L   Total Protein 6.7 6.4 - 8.3 g/dL   Albumin 3.3 (L) 3.5 - 5.0 g/dL   Calcium 10.6 (H) 8.4 - 10.4 mg/dL   Anion Gap 9 3 - 11 mEq/L     Studies/Results:  Remmers, Lakynn P Collected: 09/08/2014 Client: [facility] Accession: [id number] Received: 09/11/2014 Scarlette Shorts, MDORT OF SURGICAFINAL DIAGNOSIS Diagnosis Surgical [P], mass, rectosigmoid, polyp - COLORECTAL MUCOSAL FRAGMENTS WITH HYPERPLASTIC EPITHELIAL CHANGES. - NO ADENOMATOUS EPITHELIUM OR MALIGNANCY IDENTIFIED. Medications: I have reviewed the patient's current medications.  DISCUSSION: patient understands that diverting colostomy will avert bowel obstruction in pelvis, but may not resolve pain symptoms and she may need to continue pain medication. She understands that we will consider doxil chemotherapy after she has healed from the diverting colostomy, in attempt to control the disease.She will let us know when she hears about surgery date. She will continue ~ 6 supplement drinks daily and miralax until surgery.  Assessment/Plan:  1.endometrial carcinoma IA, grade 1 with no high risk features at surgery 12-2011, then recurrent to vagina 04-2013 and progressive in pelvis despite carboplatin/taxane x 9 cycles thru 01-15-2014. RT completed 03-27-14, clinically improved initially, residual findings on CT and gyn onc exam in [place] recently more pelvic discomfort and constipation. PET with disease activity in previous sites  in pelvis. Near complete rectal obstruction by colonoscopy 09-08-14. Declined TPE. Diverting colostomy still to be scheduled, but expect this will be soon. Continue liquids po only. 2.PAC in, flushed today. Fine to use in hospital if needed. 3.multifactoria anemia: hemoglobin fairly stable and not symptomatic today  4.diabetes, HTN,hx nephrolithiasis, left breast biopsies  5.flu vaccine given  6.good EF by recent echocardiogram 7.advance directives in place  All questions answered. Patient and daughter know to call if needed prior to surgery or prior to next scheduled visit. I have tentatively scheduled her back here on 10-19-14. Time spent 25 min including >50% counseling and coordination of care.    LIVESAY,LENNIS P,  MD   09/16/2014, 1:31 PM

## 2014-09-18 ENCOUNTER — Telehealth: Payer: Self-pay

## 2014-09-18 NOTE — Telephone Encounter (Signed)
Ms. Pam Vazquez stated that she has sent 2 messages regarding scheduling Ms. Pam Vazquez's surgery ASAP.  He has not responded to the messages.   He is at a conference an will be in the office on Monday 09-21-14.

## 2014-09-21 ENCOUNTER — Telehealth: Payer: Self-pay

## 2014-09-21 ENCOUNTER — Other Ambulatory Visit (INDEPENDENT_AMBULATORY_CARE_PROVIDER_SITE_OTHER): Payer: Self-pay | Admitting: Surgery

## 2014-09-21 NOTE — Progress Notes (Signed)
Please put orders in Epic surgery 09-23-14 pre op 09-22-14 Thanks

## 2014-09-21 NOTE — Telephone Encounter (Signed)
[place] surgery scheduler with Dr. [name] stated that Pam Vazquez is scheduled for her surgery Wednesday 09-23-14 with Dr. [name].  Patient is aware of appointment.

## 2014-09-22 ENCOUNTER — Encounter (HOSPITAL_COMMUNITY): Payer: Self-pay

## 2014-09-22 ENCOUNTER — Other Ambulatory Visit: Payer: Self-pay

## 2014-09-22 ENCOUNTER — Encounter (HOSPITAL_COMMUNITY)
Admission: RE | Admit: 2014-09-22 | Discharge: 2014-09-22 | Disposition: A | Discharge status: Home / Self Care | Payer: Medicare Other | Source: Ambulatory Visit | Attending: Surgery | Admitting: Surgery

## 2014-09-22 LAB — CBC
HCT: 33 % — ABNORMAL LOW (ref 36.0–46.0)
Hemoglobin: 10.4 g/dL — ABNORMAL LOW (ref 12.0–15.0)
MCH: 30.7 pg (ref 26.0–34.0)
MCHC: 31.5 g/dL (ref 30.0–36.0)
MCV: 97.3 fL (ref 78.0–100.0)
PLATELETS: 246 10*3/uL (ref 150–400)
RBC: 3.39 MIL/uL — AB (ref 3.87–5.11)
RDW: 13.8 % (ref 11.5–15.5)
WBC: 8.8 10*3/uL (ref 4.0–10.5)

## 2014-09-22 NOTE — Progress Notes (Signed)
Pt has Fentanyl Patch on currently instructed to not place new one on leave current one on and notify anesthesia when arrive

## 2014-09-22 NOTE — H&P (Signed)
Pam Vazquez. Bellows 09/11/2014 4:03 PM Location: [place] [facility] Patient #: [id number] DOB: 01-21-1946 Widowed / Language: Undefined / Race: Undefined Female  History of Present Illness Pam Vazquez H. Pam Gaskins MD; 09/11/2014 7:31 PM) Patient words: rectal obstruction.  The patient is a 68 year old female who presents with a complaint of abdominal distention. Her PCP is Dr. Keturah Vazquez. [name]). Her oncologist is Dr. Godfrey Vazquez. (I discussed her case with Dr. Marko Vazquez on 09/21/2014).  She has seen Dr. Skeet Vazquez from a gyn/onc standpoint. She sees Dr. Chauncey Vazquez. Pam Vazquez from GYN. She had a colonoscopy by Dr. Rikki Vazquez on 09/08/2014. He saw a high proximal rectal obstruction secondary to tumor invasion. 2 daughters with her - Pam Vazquez and Pam Vazquez.  She has pain in her lower abdomen/pelvis and pain in her low back. Because of the high-grade obstruction seen by Dr. Henrene Vazquez on his colonoscopy, Dr. Marko Vazquez has suggested that she stay on liquids. She thinks she's lost 50 pounds over the last year to year and a half. She actually does not have much in the way of obstructive symptoms. But she is on liquids and taking Miralax daily. She has pain with her bowel movements. She has no history of stomach, liver, pancreas, or prior colon disease.  Patient had robotic assisted total laparoscopic hysterectomy bilateral salpingo-oophorectomy right pelvic lymph node biopsy on 12/19/2011 - Dr. Guy Vazquez. 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 RT completed 03-27-14 ([place]), clinically improved initially, residual findings on CT and gyn onc exam in July, and recently more pelvic discomfort and constipation.  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  Metabolic activity within these lesions is decreased significantly from prior PET-CT scan.  She saw Dr. Skeet Vazquez at [facility] on 08/21/2014 to discuss TPE.  Past Medical History: 1. Pain control - on Duragesic 50 mcg/72 hours. Also on oxycodone. 2. PAC in, flushed 07-22-14, needs flush early Nov if now used prior 3. multifactoria anemia: hemoglobin fairly stable and not symptomatic today 4. diabetes (not on meds), HTN, hx nephrolithiasis, left breast biopsies 5. flu vaccine given 6. hypokalemia resolved 7. advance directives in place 8. good EF by echocardiogram 07-23-14 9. She has had some knee problems  Social History: Widowed. 2 daughters with her - Pam Vazquez and Pam Vazquez.  Other Problems Pam Vazquez, Pam Vazquez; 09/11/2014 4:03 PM) Back Pain Cancer Diabetes Mellitus High blood pressure Kidney Stone  Past Surgical History Pam Vazquez, Pam Vazquez; 09/11/2014 4:03 PM) Breast Biopsy Left. Hysterectomy (due to cancer) - Complete Spinal Pam - Lower Back  Diagnostic Studies History Pam Vazquez, Pam Vazquez; 09/11/2014 4:03 PM) Colonoscopy within last year Mammogram within last year Pap Smear 1-5 years ago  Allergies Pam Vazquez, Pam Vazquez; 09/11/2014 4:07 PM) No Known Drug Allergies10/30/2015  Medication History Pam Vazquez, Pam Vazquez; 09/11/2014 4:10 PM) Amlodipine Besy-Benazepril HCl (2.5-10MG  Capsule, Oral daily) Active. Vitamin D3 Maximum Strength (5000UNIT Capsule, Oral daily) Active. Ferrous Sulfate (325 (65 Fe)MG Tablet DR, Oral daily) Active. Gabapentin (100MG  Capsule, Oral daily) Active. Metoprolol Tartrate (50MG  Tablet, Oral daily) Active. Mirtazapine (30MG  Tablet Disperse, Oral daily) Active. Multivitamins (Oral daily) Active. Megace Oral (40MG /ML Suspension, Oral daily) Active. Loratadine (10MG  Tablet, Oral daily) Active. FentaNYL (50MCG/HR Patch 72HR, Transdermal daily) Active.  Social History (Pam Vazquez; 09/11/2014 4:03 PM) Caffeine use  Carbonated beverages, Coffee, Tea. Tobacco use Former smoker.  Family History Pam Vazquez, Pam Vazquez; 09/11/2014 4:03 PM) Arthritis Mother. Diabetes Mellitus Mother. Heart Disease Mother. Heart disease in female family member before age 8 Hypertension Father, Mother.  Pregnancy / Birth History Pam Camela, [name]; 09/11/2014 4:03 PM) Age at menarche 21 years. Age of menopause 51-55 Contraceptive History Oral contraceptives. Gravida 2 Maternal age 24-20 Para 2  Review of Systems (Pam Vazquez; 09/11/2014 4:03 PM) General Present- Fatigue and Weight Loss. Not Present- Appetite Loss, Chills, Fever, Night Sweats and Weight Gain. Gastrointestinal Present- Abdominal Pain, Change in Bowel Habits, Constipation and Rectal Pain. Not Present- Bloating, Bloody Stool, Chronic diarrhea, Difficulty Swallowing, Excessive gas, Gets full quickly at meals, Hemorrhoids, Indigestion, Nausea and Vomiting. Female Genitourinary Present- Pelvic Pain. Not Present- Frequency, Nocturia, Painful Urination and Urgency. Musculoskeletal Present- Back Pain. Not Present- Joint Pain, Joint Stiffness, Muscle Pain, Muscle Weakness and Swelling of Extremities. Psychiatric Present- Change in Sleep Pattern. Not Present- Anxiety, Bipolar, Depression, Fearful and Frequent crying.   Vitals (Pam Vazquez Pam Vazquez; 09/11/2014 4:11 PM) 09/11/2014 4:11 PM Weight: 119 lb Height: 60in Body Surface Area: 1.51 m Body Mass Index: 23.24 kg/m Temp.: 97.31F(Temporal)  Pulse: 72 (Regular)  BP: 124/70 (Sitting, Left Arm, Standard)  Physical Exam: General: Thin older WF, alert. HEENT: Normal. Pupils equal.  Neck: Supple. No mass. No thyroid mass. Lymph Nodes: No supraclavicular or cervical nodes.  Lungs: Clear to auscultation and symmetric breath sounds. Heart: RRR. No murmur or rub.  Abdomen: Soft. No mass. No hernia. Normal bowel sounds. Suprapubic tenderness. Rectal: Pain anterior rectum. Question tumor  nodule vs fissure. Pelvic: Tumor at top of vaginal cuff. Tumor in posterior vagina. Digital exam was bloody and she said that she has been having a vaginal discharge.  Extremities: Good strength and ROM in upper and lower extremities.  Neurologic: Grossly intact to motor and sensory function. Psychiatric: Has normal mood and affect. Behavior is normal.  Assessment & Plan  1.  COLONIC OBSTRUCTION (560.9  K56.69)  Story: Advanced endometirial cancer.  Impression: Discussed with patient and her daughters about proceeding with a diverting colostomy. She will also need a mucous fistula.  I gave her a Moviprep bowel prep (she seemed to tolerate this for her colonoscopy).  Will have ostomy nurse mark her pre op.  Will schedule lap assisted diverting colostomy.  Current Plans  Schedule for [facility]  2.  ENDOMETRIAL CANCER (182.0  C54.1) - recurrent, locally advance  Alphonsa Overall, MD, [facility] Pager: [phone] Office phone:  [phone]

## 2014-09-22 NOTE — Consult Note (Signed)
WOC ostomy consult note Patient is marked for ostomy surgery (colostomy with mucus fistula) per Dr. Pollie Friar request.  Patient's abdomen is assessed in the sitting and lying positions and within the abdominal rectus muscle.  She is accompanied to this appointment by her daughter.   She is tearful, but encouraged about surgery relieving her pain.  She has deep creases superior to the mark selected; mark is 3.5cm below and 5cm to the left of the umbilicus.  It is made with a surgical site marking pen and covered with a thin film dressing.  Patient is scheduled for OR tomorrow. Patient declines reading and teaching materials today, but I have sent them home with her daughter for future use and for reinforcement of teaching in the post-operative period.  Patient is able to articulate the role of the [place] Nurse in the post-operative period. [place] nursing team looks forward to assisting this nice woman in the post operative period and after.  We will remain available to this patient, the nursing, srugical and medical teams.   Thanks, Maudie Flakes, MSN, RN, [place], [place], [place] [phone])

## 2014-09-22 NOTE — Progress Notes (Signed)
CBC results per PAT visit 09/22/2014 in epic sent to Dr Shann Medal

## 2014-09-22 NOTE — Progress Notes (Signed)
CMP epic 09/16/2014  CXR epic 01/13/2014

## 2014-09-22 NOTE — Patient Instructions (Addendum)
[place]  09/22/2014   Your procedure is scheduled on:    Wednesday - November 11,2015  Report to [facility] Main Entrance and follow signs to  [place] arrive at 11:30 AM.  Call this number if you have problems the morning of surgery [phone] or Presurgical Testing [phone].   Remember:  Do not eat food After Midnight but may have clear liquids till 7:30am day of surgery then nothing by mouth.               Remember: follow any bowel prep instructions per MD office.    Take these medicines the morning of surgery with A SIP OF WATER: Amlodipine;Gabapentin;Loratidine(Claritin) if needed;Metoprolol;Oxycodone if needed.                               You may not have any metal on your body including hair pins and piercings  Do not wear jewelry, make-up, lotions, powders, or deodorant.  Do not shave.               Do not bring valuables to the hospital. [place].  Contacts, dentures or bridgework may not be worn into surgery.  Leave suitcase in the car. After surgery it may be brought to your room.  For patients admitted to the hospital, checkout time is 11:00 AM the day of discharge.   ________________________________________________________________________  [facility] - Preparing for Surgery Before surgery, you can play an important role.  Because skin is not sterile, your skin needs to be as free of germs as possible.  You can reduce the number of germs on your skin by washing with CHG (chlorahexidine gluconate) soap before surgery.  CHG is an antiseptic cleaner which kills germs and bonds with the skin to continue killing germs even after washing. Please DO NOT use if you have an allergy to CHG or antibacterial soaps.  If your skin becomes reddened/irritated stop using the CHG and inform your nurse when you arrive at Short Stay. Do not shave (including legs and underarms) for at least 48 hours prior to the first CHG  shower.  You may shave your face/neck. Please follow these instructions carefully:  1.  Shower with CHG Soap the night before surgery and the  morning of Surgery.  2.  If you choose to wash your hair, wash your hair first as usual with your  normal  shampoo.  3.  After you shampoo, rinse your hair and body thoroughly to remove the  shampoo.                           4.  Use CHG as you would any other liquid soap.  You can apply chg directly  to the skin and wash                       Gently with a scrungie or clean washcloth.  5.  Apply the CHG Soap to your body ONLY FROM THE NECK DOWN.   Do not use on face/ open                           Wound or open sores. Avoid contact with eyes, ears mouth and genitals (private parts).  Wash face,  Genitals (private parts) with your normal soap.             6.  Wash thoroughly, paying special attention to the area where your surgery  will be performed.  7.  Thoroughly rinse your body with warm water from the neck down.  8.  DO NOT shower/wash with your normal soap after using and rinsing off  the CHG Soap.                9.  Pat yourself dry with a clean towel.            10.  Wear clean pajamas.            11.  Place clean sheets on your bed the night of your first shower and do not  sleep with pets. Day of Surgery : Do not apply any lotions/deodorants the morning of surgery.  Please wear clean clothes to the hospital/surgery center.  FAILURE TO FOLLOW THESE INSTRUCTIONS MAY RESULT IN THE CANCELLATION OF YOUR SURGERY PATIENT SIGNATURE_________________________________  NURSE SIGNATURE__________________________________  ________________________________________________________________________    CLEAR LIQUID DIET   Foods Allowed                                                                     Foods Excluded  Coffee and tea, regular and decaf                             liquids that you cannot  Plain Jell-O in any flavor                                              see through such as: Fruit ices (not with fruit pulp)                                     milk, soups, orange juice  Iced Popsicles                                    All solid food Carbonated beverages, regular and diet                                    Cranberry, grape and apple juices Sports drinks like Gatorade Lightly seasoned clear broth or consume(fat free) Sugar, honey syrup  Sample Menu Breakfast                                Lunch                                     Supper Cranberry juice  Beef broth                            Chicken broth Jell-O                                     Grape juice                           Apple juice Coffee or tea                        Jell-O                                      Popsicle                                                Coffee or tea                        Coffee or tea  _____________________________________________________________________

## 2014-09-22 NOTE — Progress Notes (Signed)
CT abd/pelvis epic 05/19/2014

## 2014-09-23 ENCOUNTER — Inpatient Hospital Stay (HOSPITAL_COMMUNITY)
Admission: RE | Admit: 2014-09-23 | Discharge: 2014-09-26 | DRG: 746 | Disposition: A | Discharge status: Home Health | Payer: Medicare Other | Source: Ambulatory Visit | Attending: Surgery | Admitting: Surgery

## 2014-09-23 ENCOUNTER — Encounter (HOSPITAL_COMMUNITY)
Admission: RE | Disposition: A | Discharge status: Home Health | Payer: Self-pay | Source: Ambulatory Visit | Attending: Surgery

## 2014-09-23 ENCOUNTER — Ambulatory Visit: Payer: Medicare Other | Admitting: Oncology

## 2014-09-23 ENCOUNTER — Encounter (HOSPITAL_COMMUNITY): Payer: Self-pay

## 2014-09-23 ENCOUNTER — Inpatient Hospital Stay (HOSPITAL_COMMUNITY): Payer: Medicare Other | Admitting: Anesthesiology

## 2014-09-23 ENCOUNTER — Other Ambulatory Visit: Payer: Medicare Other

## 2014-09-23 DIAGNOSIS — Z79899 Other long term (current) drug therapy: Secondary | ICD-10-CM

## 2014-09-23 DIAGNOSIS — I10 Essential (primary) hypertension: Secondary | ICD-10-CM | POA: Diagnosis not present

## 2014-09-23 DIAGNOSIS — Z8249 Family history of ischemic heart disease and other diseases of the circulatory system: Secondary | ICD-10-CM

## 2014-09-23 DIAGNOSIS — C541 Malignant neoplasm of endometrium: Secondary | ICD-10-CM | POA: Diagnosis not present

## 2014-09-23 DIAGNOSIS — Z87442 Personal history of urinary calculi: Secondary | ICD-10-CM

## 2014-09-23 DIAGNOSIS — Z833 Family history of diabetes mellitus: Secondary | ICD-10-CM | POA: Diagnosis not present

## 2014-09-23 DIAGNOSIS — Z01812 Encounter for preprocedural laboratory examination: Secondary | ICD-10-CM | POA: Diagnosis not present

## 2014-09-23 DIAGNOSIS — Z923 Personal history of irradiation: Secondary | ICD-10-CM

## 2014-09-23 DIAGNOSIS — K5660 Unspecified intestinal obstruction: Secondary | ICD-10-CM | POA: Diagnosis present

## 2014-09-23 DIAGNOSIS — Z01810 Encounter for preprocedural cardiovascular examination: Secondary | ICD-10-CM | POA: Diagnosis not present

## 2014-09-23 DIAGNOSIS — E119 Type 2 diabetes mellitus without complications: Secondary | ICD-10-CM | POA: Diagnosis present

## 2014-09-23 DIAGNOSIS — Z87891 Personal history of nicotine dependence: Secondary | ICD-10-CM | POA: Diagnosis not present

## 2014-09-23 DIAGNOSIS — E876 Hypokalemia: Secondary | ICD-10-CM | POA: Diagnosis not present

## 2014-09-23 DIAGNOSIS — D649 Anemia, unspecified: Secondary | ICD-10-CM | POA: Diagnosis present

## 2014-09-23 DIAGNOSIS — R140 Abdominal distension (gaseous): Secondary | ICD-10-CM | POA: Diagnosis present

## 2014-09-23 HISTORY — PX: LAPAROSCOPIC DIVERTED COLOSTOMY: SHX5892

## 2014-09-23 LAB — GLUCOSE, CAPILLARY: GLUCOSE-CAPILLARY: 118 mg/dL — AB (ref 70–99)

## 2014-09-23 SURGERY — CREATION, COLOSTOMY, DIVERTING, LAPAROSCOPIC
Anesthesia: General

## 2014-09-23 MED ORDER — LIDOCAINE HCL (PF) 2 % IJ SOLN
INTRAMUSCULAR | Status: DC | PRN
  Administered 2014-09-23: 30 mg via INTRADERMAL

## 2014-09-23 MED ORDER — CHLORHEXIDINE GLUCONATE 4 % EX LIQD: 1.0000 "application " | Freq: Once | CUTANEOUS | Status: DC

## 2014-09-23 MED ORDER — ONDANSETRON HCL 4 MG/2ML IJ SOLN: 4.0000 mg | Freq: Once | INTRAMUSCULAR | Status: DC | PRN

## 2014-09-23 MED ORDER — GABAPENTIN 100 MG PO CAPS
200.0000 mg | ORAL_CAPSULE | Freq: Three times a day (TID) | ORAL | Status: DC
  Administered 2014-09-24 – 2014-09-26 (×7): 200 mg via ORAL
  Filled 2014-09-23 (×9): qty 2

## 2014-09-23 MED ORDER — MIDAZOLAM HCL 5 MG/5ML IJ SOLN
INTRAMUSCULAR | Status: DC | PRN
  Administered 2014-09-23: 2 mg via INTRAVENOUS

## 2014-09-23 MED ORDER — OXYCODONE HCL 5 MG PO TABS
10.0000 mg | ORAL_TABLET | Freq: Four times a day (QID) | ORAL | Status: DC | PRN
  Administered 2014-09-24 – 2014-09-25 (×6): 20 mg via ORAL
  Filled 2014-09-23 (×6): qty 4

## 2014-09-23 MED ORDER — GLYCOPYRROLATE 0.2 MG/ML IJ SOLN
INTRAMUSCULAR | Status: DC | PRN
  Administered 2014-09-23: 0.4 mg via INTRAVENOUS

## 2014-09-23 MED ORDER — ONDANSETRON HCL 4 MG/2ML IJ SOLN
INTRAMUSCULAR | Status: AC
  Filled 2014-09-23: qty 2

## 2014-09-23 MED ORDER — 0.9 % SODIUM CHLORIDE (POUR BTL) OPTIME
TOPICAL | Status: DC | PRN
  Administered 2014-09-23: 1000 mL

## 2014-09-23 MED ORDER — PROPOFOL 10 MG/ML IV BOLUS
INTRAVENOUS | Status: DC | PRN
  Administered 2014-09-23: 100 mg via INTRAVENOUS

## 2014-09-23 MED ORDER — DEXTROSE 5 % IV SOLN
INTRAVENOUS | Status: AC
  Filled 2014-09-23: qty 2

## 2014-09-23 MED ORDER — KCL IN DEXTROSE-NACL 20-5-0.45 MEQ/L-%-% IV SOLN
INTRAVENOUS | Status: DC
  Administered 2014-09-23 – 2014-09-26 (×5): via INTRAVENOUS
  Filled 2014-09-23 (×7): qty 1000

## 2014-09-23 MED ORDER — GLYCOPYRROLATE 0.2 MG/ML IJ SOLN
INTRAMUSCULAR | Status: AC
  Filled 2014-09-23: qty 2

## 2014-09-23 MED ORDER — AMLODIPINE BESYLATE 2.5 MG PO TABS
2.5000 mg | ORAL_TABLET | Freq: Every morning | ORAL | Status: DC
  Administered 2014-09-24 – 2014-09-26 (×3): 2.5 mg via ORAL
  Filled 2014-09-23 (×3): qty 1

## 2014-09-23 MED ORDER — FENTANYL CITRATE 0.05 MG/ML IJ SOLN
INTRAMUSCULAR | Status: AC
  Filled 2014-09-23: qty 2

## 2014-09-23 MED ORDER — FENTANYL CITRATE 0.05 MG/ML IJ SOLN
INTRAMUSCULAR | Status: AC
  Administered 2014-09-23: 50 ug via INTRAVENOUS
  Filled 2014-09-23: qty 2

## 2014-09-23 MED ORDER — KCL IN DEXTROSE-NACL 20-5-0.45 MEQ/L-%-% IV SOLN
INTRAVENOUS | Status: AC
  Filled 2014-09-23: qty 1000

## 2014-09-23 MED ORDER — MIDAZOLAM HCL 2 MG/2ML IJ SOLN
INTRAMUSCULAR | Status: AC
  Filled 2014-09-23: qty 2

## 2014-09-23 MED ORDER — FENTANYL CITRATE 0.05 MG/ML IJ SOLN
25.0000 ug | INTRAMUSCULAR | Status: DC | PRN
  Administered 2014-09-23 (×4): 50 ug via INTRAVENOUS

## 2014-09-23 MED ORDER — DEXAMETHASONE SODIUM PHOSPHATE 10 MG/ML IJ SOLN
INTRAMUSCULAR | Status: AC
  Filled 2014-09-23: qty 1

## 2014-09-23 MED ORDER — PROPOFOL 10 MG/ML IV BOLUS
INTRAVENOUS | Status: AC
  Filled 2014-09-23: qty 20

## 2014-09-23 MED ORDER — DEXAMETHASONE SODIUM PHOSPHATE 10 MG/ML IJ SOLN
INTRAMUSCULAR | Status: DC | PRN
Start: 2014-09-23 — End: 2014-09-23
  Administered 2014-09-23: 10 mg via INTRAVENOUS

## 2014-09-23 MED ORDER — LACTATED RINGERS IV SOLN
INTRAVENOUS | Status: DC
  Administered 2014-09-23: 15:00:00 via INTRAVENOUS
  Administered 2014-09-23: 1000 mL via INTRAVENOUS

## 2014-09-23 MED ORDER — ROCURONIUM BROMIDE 100 MG/10ML IV SOLN
INTRAVENOUS | Status: DC | PRN
  Administered 2014-09-23: 20 mg via INTRAVENOUS
  Administered 2014-09-23: 5 mg via INTRAVENOUS

## 2014-09-23 MED ORDER — MEGESTROL ACETATE 400 MG/10ML PO SUSP
400.0000 mg | Freq: Two times a day (BID) | ORAL | Status: DC
  Administered 2014-09-24 – 2014-09-26 (×5): 400 mg via ORAL
  Filled 2014-09-23 (×6): qty 10

## 2014-09-23 MED ORDER — BUPIVACAINE-EPINEPHRINE (PF) 0.25% -1:200000 IJ SOLN
INTRAMUSCULAR | Status: AC
  Filled 2014-09-23: qty 30

## 2014-09-23 MED ORDER — METOPROLOL TARTRATE 50 MG PO TABS
50.0000 mg | ORAL_TABLET | Freq: Every morning | ORAL | Status: DC
  Administered 2014-09-24 – 2014-09-26 (×3): 50 mg via ORAL
  Filled 2014-09-23 (×3): qty 1

## 2014-09-23 MED ORDER — HEPARIN SODIUM (PORCINE) 5000 UNIT/ML IJ SOLN
5000.0000 [IU] | Freq: Three times a day (TID) | INTRAMUSCULAR | Status: DC
  Administered 2014-09-23 – 2014-09-26 (×8): 5000 [IU] via SUBCUTANEOUS
  Filled 2014-09-23 (×11): qty 1

## 2014-09-23 MED ORDER — ONDANSETRON HCL 4 MG/2ML IJ SOLN
INTRAMUSCULAR | Status: DC | PRN
  Administered 2014-09-23: 4 mg via INTRAVENOUS

## 2014-09-23 MED ORDER — ONDANSETRON HCL 4 MG/2ML IJ SOLN: 4.0000 mg | Freq: Four times a day (QID) | INTRAMUSCULAR | Status: DC | PRN

## 2014-09-23 MED ORDER — FENTANYL CITRATE 0.05 MG/ML IJ SOLN
INTRAMUSCULAR | Status: AC
  Filled 2014-09-23: qty 5

## 2014-09-23 MED ORDER — HEPARIN SODIUM (PORCINE) 5000 UNIT/ML IJ SOLN
5000.0000 [IU] | Freq: Once | INTRAMUSCULAR | Status: AC
  Administered 2014-09-23: 5000 [IU] via SUBCUTANEOUS
  Filled 2014-09-23: qty 1

## 2014-09-23 MED ORDER — FENTANYL 50 MCG/HR TD PT72
50.0000 ug | MEDICATED_PATCH | TRANSDERMAL | Status: DC
  Administered 2014-09-23: 50 ug via TRANSDERMAL
  Filled 2014-09-23: qty 1

## 2014-09-23 MED ORDER — LIDOCAINE HCL (CARDIAC) 20 MG/ML IV SOLN
INTRAVENOUS | Status: AC
  Filled 2014-09-23: qty 5

## 2014-09-23 MED ORDER — DEXTROSE 5 % IV SOLN
2.0000 g | INTRAVENOUS | Status: AC
  Administered 2014-09-23: 2 g via INTRAVENOUS

## 2014-09-23 MED ORDER — LACTATED RINGERS IR SOLN
Status: DC | PRN
  Administered 2014-09-23: 1000 mL

## 2014-09-23 MED ORDER — SUCCINYLCHOLINE CHLORIDE 20 MG/ML IJ SOLN
INTRAMUSCULAR | Status: DC | PRN
  Administered 2014-09-23: 70 mg via INTRAVENOUS

## 2014-09-23 MED ORDER — NEOSTIGMINE METHYLSULFATE 10 MG/10ML IV SOLN
INTRAVENOUS | Status: DC | PRN
  Administered 2014-09-23: 3 mg via INTRAVENOUS

## 2014-09-23 MED ORDER — ONDANSETRON HCL 4 MG PO TABS
4.0000 mg | ORAL_TABLET | Freq: Four times a day (QID) | ORAL | Status: DC | PRN
Start: 2014-09-23 — End: 2014-09-26

## 2014-09-23 MED ORDER — FENTANYL CITRATE 0.05 MG/ML IJ SOLN
INTRAMUSCULAR | Status: DC | PRN
  Administered 2014-09-23 (×3): 50 ug via INTRAVENOUS
  Administered 2014-09-23: 100 ug via INTRAVENOUS

## 2014-09-23 MED ORDER — ROCURONIUM BROMIDE 100 MG/10ML IV SOLN
INTRAVENOUS | Status: AC
  Filled 2014-09-23: qty 1

## 2014-09-23 MED ORDER — MORPHINE SULFATE 2 MG/ML IJ SOLN
1.0000 mg | INTRAMUSCULAR | Status: DC | PRN
  Administered 2014-09-23 (×3): 2 mg via INTRAVENOUS
  Administered 2014-09-24: 4 mg via INTRAVENOUS
  Filled 2014-09-23 (×3): qty 1
  Filled 2014-09-23: qty 2

## 2014-09-23 SURGICAL SUPPLY — 81 items
ADH SKN CLS APL DERMABOND .7 (GAUZE/BANDAGES/DRESSINGS) ×1
APPLIER CLIP 5 13 M/L LIGAMAX5 (MISCELLANEOUS)
APPLIER CLIP ROT 10 11.4 M/L (STAPLE)
APR CLP MED LRG 11.4X10 (STAPLE)
APR CLP MED LRG 5 ANG JAW (MISCELLANEOUS)
BLADE EXTENDED COATED 6.5IN (ELECTRODE) IMPLANT
BLADE HEX COATED 2.75 (ELECTRODE) ×3 IMPLANT
BLADE SURG SZ10 CARB STEEL (BLADE) ×6 IMPLANT
CANISTER SUCTION 2500CC (MISCELLANEOUS) ×3 IMPLANT
CELLS DAT CNTRL 66122 CELL SVR (MISCELLANEOUS) IMPLANT
CLIP APPLIE 5 13 M/L LIGAMAX5 (MISCELLANEOUS) IMPLANT
CLIP APPLIE ROT 10 11.4 M/L (STAPLE) IMPLANT
CLOSURE WOUND 1/2 X4 (GAUZE/BANDAGES/DRESSINGS)
COVER MAYO STAND STRL (DRAPES) ×3 IMPLANT
CUTTER FLEX LINEAR 45M (STAPLE) IMPLANT
DECANTER SPIKE VIAL GLASS SM (MISCELLANEOUS) ×3 IMPLANT
DERMABOND ADVANCED (GAUZE/BANDAGES/DRESSINGS) ×2
DERMABOND ADVANCED .7 DNX12 (GAUZE/BANDAGES/DRESSINGS) IMPLANT
DRAPE LAPAROSCOPIC ABDOMINAL (DRAPES) ×3 IMPLANT
DRAPE SHEET LG 3/4 BI-LAMINATE (DRAPES) IMPLANT
DRAPE WARM FLUID 44X44 (DRAPE) ×3 IMPLANT
DRSG TELFA 3X8 NADH (GAUZE/BANDAGES/DRESSINGS) IMPLANT
ELECT REM PT RETURN 9FT ADLT (ELECTROSURGICAL) ×3
ELECTRODE REM PT RTRN 9FT ADLT (ELECTROSURGICAL) ×1 IMPLANT
FILTER SMOKE EVAC LAPAROSHD (FILTER) IMPLANT
GAUZE SPONGE 4X4 12PLY STRL (GAUZE/BANDAGES/DRESSINGS) ×3 IMPLANT
GLOVE SURG SIGNA 7.5 PF LTX (GLOVE) ×6 IMPLANT
GOWN SPEC L4 XLG W/TWL (GOWN DISPOSABLE) ×3 IMPLANT
GOWN STRL REUS W/ TWL XL LVL3 (GOWN DISPOSABLE) ×3 IMPLANT
GOWN STRL REUS W/TWL LRG LVL3 (GOWN DISPOSABLE) ×3 IMPLANT
GOWN STRL REUS W/TWL XL LVL3 (GOWN DISPOSABLE) ×9
KIT BASIN OR (CUSTOM PROCEDURE TRAY) ×3 IMPLANT
LEGGING LITHOTOMY PAIR STRL (DRAPES) IMPLANT
LIGASURE IMPACT 36 18CM CVD LR (INSTRUMENTS) IMPLANT
LIQUID BAND (GAUZE/BANDAGES/DRESSINGS) IMPLANT
NS IRRIG 1000ML POUR BTL (IV SOLUTION) ×3 IMPLANT
PAD DRESSING TELFA 3X8 NADH (GAUZE/BANDAGES/DRESSINGS) IMPLANT
PENCIL BUTTON HOLSTER BLD 10FT (ELECTRODE) ×3 IMPLANT
RELOAD 45 VASCULAR/THIN (ENDOMECHANICALS) IMPLANT
RELOAD STAPLE 45 2.5 WHT GRN (ENDOMECHANICALS) IMPLANT
RELOAD STAPLE 45 3.5 BLU ETS (ENDOMECHANICALS) IMPLANT
RELOAD STAPLE 60 3.6 BLU REG (STAPLE) ×1 IMPLANT
RELOAD STAPLE TA45 3.5 REG BLU (ENDOMECHANICALS) IMPLANT
RELOAD STAPLER BLUE 60MM (STAPLE) ×2 IMPLANT
RETRACTOR WND ALEXIS 18 MED (MISCELLANEOUS) IMPLANT
RTRCTR WOUND ALEXIS 18CM MED (MISCELLANEOUS)
SCALPEL HARMONIC ACE (MISCELLANEOUS) ×2 IMPLANT
SET IRRIG TUBING LAPAROSCOPIC (IRRIGATION / IRRIGATOR) IMPLANT
SHEARS HARMONIC ACE PLUS 36CM (ENDOMECHANICALS) IMPLANT
SLEEVE XCEL OPT CAN 5 100 (ENDOMECHANICALS) ×2 IMPLANT
SOLUTION ANTI FOG 6CC (MISCELLANEOUS) ×3 IMPLANT
SPONGE LAP 18X18 X RAY DECT (DISPOSABLE) ×6 IMPLANT
STAPLE ECHEON FLEX 60 POW ENDO (STAPLE) ×2 IMPLANT
STAPLER RELOAD BLUE 60MM (STAPLE) ×6
STAPLER VISISTAT 35W (STAPLE) IMPLANT
STRIP CLOSURE SKIN 1/2X4 (GAUZE/BANDAGES/DRESSINGS) IMPLANT
SUCTION POOLE TIP (SUCTIONS) ×3 IMPLANT
SUT MNCRL AB 4-0 PS2 18 (SUTURE) ×2 IMPLANT
SUT MON AB 5-0 PS2 18 (SUTURE) ×3 IMPLANT
SUT NOVA NAB DX-16 0-1 5-0 T12 (SUTURE) IMPLANT
SUT PROLENE 2 0 KS (SUTURE) IMPLANT
SUT PROLENE 2 0 SH DA (SUTURE) IMPLANT
SUT SILK 2 0 (SUTURE) ×3
SUT SILK 2 0 SH CR/8 (SUTURE) IMPLANT
SUT SILK 2-0 18XBRD TIE 12 (SUTURE) ×1 IMPLANT
SUT SILK 3 0 (SUTURE)
SUT SILK 3 0 SH CR/8 (SUTURE) IMPLANT
SUT SILK 3-0 18XBRD TIE 12 (SUTURE) IMPLANT
SUT VIC AB 3-0 SH 18 (SUTURE) ×8 IMPLANT
SYR BULB IRRIGATION 50ML (SYRINGE) ×3 IMPLANT
TOWEL OR 17X26 10 PK STRL BLUE (TOWEL DISPOSABLE) ×3 IMPLANT
TRAY FOLEY CATH 14FRSI W/METER (CATHETERS) ×3 IMPLANT
TRAY LAPAROSCOPIC (CUSTOM PROCEDURE TRAY) ×3 IMPLANT
TROCAR BLADELESS OPT 5 75 (ENDOMECHANICALS) ×3 IMPLANT
TROCAR XCEL 12X100 BLDLESS (ENDOMECHANICALS) ×2 IMPLANT
TROCAR XCEL BLUNT TIP 100MML (ENDOMECHANICALS) IMPLANT
TROCAR XCEL NON-BLD 11X100MML (ENDOMECHANICALS) IMPLANT
TROCAR XCEL UNIV SLVE 11M 100M (ENDOMECHANICALS) IMPLANT
TUBING FILTER THERMOFLATOR (ELECTROSURGICAL) ×3 IMPLANT
YANKAUER SUCT BULB TIP 10FT TU (MISCELLANEOUS) ×3 IMPLANT
YANKAUER SUCT BULB TIP NO VENT (SUCTIONS) ×3 IMPLANT

## 2014-09-23 NOTE — Anesthesia Postprocedure Evaluation (Signed)
  Anesthesia Post-op Note  Patient: Pam Vazquez  Procedure(s) Performed: Procedure(s) (LRB): LAPAROSCOPIC END COLOSTOMY MUCOUS FISTULA, BIOPSY OF VAGINAL TUMOR (N/A)  Patient Location: PACU  Anesthesia Type: General  Level of Consciousness: awake and alert   Airway and Oxygen Therapy: Patient Spontanous Breathing  Post-op Pain: mild  Post-op Assessment: Post-op Vital signs reviewed, Patient's Cardiovascular Status Stable, Respiratory Function Stable, Patent Airway and No signs of Nausea or vomiting  Last Vitals:  Filed Vitals:   09/23/14 1645  BP:   Pulse: 87  Temp:   Resp: 15    Post-op Vital Signs: stable   Complications: No apparent anesthesia complications

## 2014-09-23 NOTE — Transfer of Care (Signed)
Immediate Anesthesia Transfer of Care Note  Patient: JANNETH KRASNER  Procedure(s) Performed: Procedure(s) (LRB): LAPAROSCOPIC END COLOSTOMY MUCOUS FISTULA, BIOPSY OF VAGINAL TUMOR (N/A)  Patient Location: PACU  Anesthesia Type: General  Level of Consciousness: sedated, patient cooperative and responds to stimulation  Airway & Oxygen Therapy: Patient Spontanous Breathing and Patient connected to face mask oxgen  Post-op Assessment: Report given to PACU RN and Post -op Vital signs reviewed and stable  Post vital signs: Reviewed and stable  Complications: No apparent anesthesia complications

## 2014-09-23 NOTE — Anesthesia Preprocedure Evaluation (Addendum)
Anesthesia Evaluation  Patient identified by MRN, date of birth, ID band Patient awake    Reviewed: Allergy & Precautions, H&P , NPO status , Patient's Chart, lab work & pertinent test results  History of Anesthesia Complications Negative for: history of anesthetic complications  Airway Mallampati: II  TM Distance: >3 FB Neck ROM: Full    Dental no notable dental hx. (+) Dental Advisory Given, Teeth Intact   Pulmonary former smoker,  breath sounds clear to auscultation  Pulmonary exam normal       Cardiovascular hypertension, Pt. on medications and Pt. on home beta blockers Rhythm:Regular Rate:Normal     Neuro/Psych negative neurological ROS  negative psych ROS   GI/Hepatic negative GI ROS, Neg liver ROS,   Endo/Other  diabetes (hx of DM on metformin but secondary to diet control, has subsequently been able to come off it), Type 2  Renal/GU CRFRenal disease  negative genitourinary   Musculoskeletal negative musculoskeletal ROS (+)   Abdominal   Peds negative pediatric ROS (+)  Hematology negative hematology ROS (+)   Anesthesia Other Findings   Reproductive/Obstetrics negative OB ROS                            Anesthesia Physical Anesthesia Plan  ASA: II  Anesthesia Plan: General   Post-op Pain Management:    Induction: Intravenous  Airway Management Planned: Oral ETT  Additional Equipment:   Intra-op Plan:   Post-operative Plan: Extubation in OR  Informed Consent: I have reviewed the patients History and Physical, chart, labs and discussed the procedure including the risks, benefits and alternatives for the proposed anesthesia with the patient or authorized representative who has indicated his/her understanding and acceptance.   Dental advisory given  Plan Discussed with: CRNA  Anesthesia Plan Comments:         Anesthesia Quick Evaluation

## 2014-09-23 NOTE — Interval H&P Note (Signed)
History and Physical Interval Note:  09/23/2014 1:21 PM  Pam Vazquez  has presented today for surgery, with the diagnosis of pelvic cancer  The various methods of treatment have been discussed with the patient and family.  The patient's daughter is with her today.  After consideration of risks, benefits and other options for treatment, the patient has consented to  Procedure(s): LAPAROSCOPIC DIVERTED OSTOMY (N/A) as a surgical intervention .  The patient's history has been reviewed, patient examined, no change in status, stable for surgery.  I have reviewed the patient's chart and labs.  Questions were answered to the patient's satisfaction.     [name] H

## 2014-09-23 NOTE — Consult Note (Signed)
WOC ostomy follow up Following a telephonic conversation with Dr. [name], a higher site marking is made in the event that he is not able to use the LLQ marking per his request.  Patient's abdominal crease is marked so that Dr. [name] can avoid if at all possible.  The second-choice marking is in the LUQ, 4cm above and 5cm to the left of the umbilicus.  A third-choice stoma site marking is provided on the right in the upper quadrant, 3cm above and 4cm to the right of the umbilicus.  Both additional markings are made with a skin marking pen adn covered with a thin film transparent dressing. Patient and daughter are in good spirits this afternoon and remain hopeful regarding surgery. [place] nursing team will follow, and will remain available to this patient, the nursing, surgical and medical teams.  . Thanks, Maudie Flakes, MSN, RN, [place], [place], [place] [phone])

## 2014-09-24 ENCOUNTER — Encounter (HOSPITAL_COMMUNITY): Payer: Self-pay | Admitting: Surgery

## 2014-09-24 LAB — CBC
HCT: 28.2 % — ABNORMAL LOW (ref 36.0–46.0)
Hemoglobin: 9.5 g/dL — ABNORMAL LOW (ref 12.0–15.0)
MCH: 32.2 pg (ref 26.0–34.0)
MCHC: 33.7 g/dL (ref 30.0–36.0)
MCV: 95.6 fL (ref 78.0–100.0)
PLATELETS: 207 10*3/uL (ref 150–400)
RBC: 2.95 MIL/uL — ABNORMAL LOW (ref 3.87–5.11)
RDW: 13.8 % (ref 11.5–15.5)
WBC: 8.9 10*3/uL (ref 4.0–10.5)

## 2014-09-24 LAB — BASIC METABOLIC PANEL
ANION GAP: 11 (ref 5–15)
BUN: 11 mg/dL (ref 6–23)
CO2: 26 mEq/L (ref 19–32)
Calcium: 10.2 mg/dL (ref 8.4–10.5)
Chloride: 100 mEq/L (ref 96–112)
Creatinine, Ser: 0.83 mg/dL (ref 0.50–1.10)
GFR calc Af Amer: 83 mL/min — ABNORMAL LOW (ref >90)
GFR, EST NON AFRICAN AMERICAN: 71 mL/min — AB (ref >90)
Glucose, Bld: 254 mg/dL — ABNORMAL HIGH (ref 70–99)
POTASSIUM: 3.6 meq/L — AB (ref 3.7–5.3)
SODIUM: 137 meq/L (ref 137–147)

## 2014-09-24 MED ORDER — MORPHINE SULFATE 4 MG/ML IJ SOLN
4.0000 mg | Freq: Once | INTRAMUSCULAR | Status: AC
  Administered 2014-09-24: 4 mg via INTRAVENOUS

## 2014-09-24 MED ORDER — MORPHINE SULFATE 2 MG/ML IJ SOLN
1.0000 mg | INTRAMUSCULAR | Status: DC | PRN
  Administered 2014-09-24 – 2014-09-26 (×14): 4 mg via INTRAVENOUS
  Filled 2014-09-24 (×14): qty 2

## 2014-09-24 NOTE — Consult Note (Signed)
WOC ostomy consult note Stoma type/location: Colostomy LUQ, Mucus fistula in LLQ Stomal assessment/size: Colostomy measures 1 and 3/8 inches round, moist, red and budded.  Mucus fistula measures 1 and 1/4 inches and is moist, red and slightly oval at rest.  With mild traction at 12 o'clock, stoma is round. Peristomal assessment: Intact, clear Treatment options for stomal/peristomal skin: Skin barrier rings used beneath both pouching systems Output Bloody drainage and soft brown stool out of both stomas at this time.  Patient is able to eat and has started on a Regular diet today. Ostomy pouching: 2pc. Pouching systems with skin barrier rings, 2 and 1/4 inches. Education provided: Patient receives reinforcement about preoperative teaching; stoma and pouch characteristics in particular.  Is anticipating discharge over the weekend (albeit I have not seen this documented) and will require [facility] for continued teaching and support for a few weeks. [place] nursing team will follow, and will remain available to this patient, the nursing, surgery, and medical teams.  Thanks, Maudie Flakes, MSN, RN, [place], [place], [place] [phone])

## 2014-09-24 NOTE — Progress Notes (Signed)
General Surgery Note  LOS: 1 day  POD -  1 Day Post-Op  Assessment/Plan: 1.  LAPAROSCOPIC END COLOSTOMY MUCOUS FISTULA, BIOPSY OF VAGINAL TUMOR - 09/23/2014 - D. [name]  She looks better than I would have expected.  Ostomies with some output.  I will advance to reg diet.  2.  Recurrent endometrial cancer - obstructing distal colon. 3.  DVT prophylaxis - SQ Heparin 3.  Anemia - Hgb 9.5 - 09/24/2014   Active Problems:   Colonic obstruction  Subjective:  She had some rough spots in the night, but is doing better this AM.  Her daughter is in the room with her.  Her pain is more suprapubic.  Objective:   Filed Vitals:   09/24/14 0542  BP: 136/68  Pulse: 74  Temp: 99.1 F (37.3 C)  Resp: 16     Intake/Output from previous day:  11/11 0701 - 11/12 0700 In: 3170.8 [I.V.:3170.8] Out: 2125 [Urine:2125]  Intake/Output this shift:      Physical Exam:   General: Older WF who is alert and oriented.    HEENT: Normal. Pupils equal. .   Lungs: Clear.  IS about 1,200.   Abdomen: Soft   Wound: 2 ostomies look okay.  She has stool out of both ostomies.   Lab Results:    Recent Labs  09/22/14 1525 09/24/14 0443  WBC 8.8 8.9  HGB 10.4* 9.5*  HCT 33.0* 28.2*  PLT 246 207    BMET   Recent Labs  09/24/14 0443  NA 137  K 3.6*  CL 100  CO2 26  GLUCOSE 254*  BUN 11  CREATININE 0.83  CALCIUM 10.2    PT/INR  No results for input(s): LABPROT, INR in the last 72 hours.  ABG  No results for input(s): PHART, HCO3 in the last 72 hours.  Invalid input(s): PCO2, PO2   Studies/Results:  No results found.   Anti-infectives:   Anti-infectives    Start     Dose/Rate Route Frequency Ordered Stop   09/23/14 1143  cefOXitin (MEFOXIN) 2 g in dextrose 5 % 50 mL IVPB     2 g100 mL/hr over 30 Minutes Intravenous On call to O.R. 09/23/14 1143 09/23/14 1400      Alphonsa Overall, MD, FACS Pager: [facility] Office: [phone] 09/24/2014

## 2014-09-24 NOTE — Plan of Care (Signed)
Problem: Phase I Progression Outcomes Goal: OOB as tolerated unless otherwise ordered Outcome: Completed/Met Date Met:  09/24/14     

## 2014-09-24 NOTE — Plan of Care (Signed)
Problem: Phase I Progression Outcomes Goal: Pouching system intact Outcome: Completed/Met Date Met:  09/24/14

## 2014-09-24 NOTE — Plan of Care (Signed)
Problem: Phase I Progression Outcomes Goal: Pain controlled with appropriate interventions Outcome: Progressing Goal: Incision/dressings dry and intact Outcome: Progressing

## 2014-09-24 NOTE — Plan of Care (Signed)
Problem: Phase I Progression Outcomes Goal: Voiding-avoid urinary catheter unless indicated Outcome: Completed/Met Date Met:  09/24/14     

## 2014-09-24 NOTE — Op Note (Signed)
NAMEJENAY, Pam Vazquez                ACCOUNT NO.:  [id number]  MEDICAL RECORD NO.:  [id number]  LOCATION:  41                         FACILITY:  [facility]  PHYSICIAN:  Fenton Malling. [name], M.D.  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  POSTOPERATIVE DIAGNOSIS:  Recurrent endometrial cancer obstructing the distal colon.  PROCEDURE:  Laparoscopic end sigmoid colostomy (LUQ ostomy), mucous fistula (LLQ ostomy), biopsy of vaginal tumor.  SURGEON:  Fenton Malling. [name], MD  FIRST ASSISTANT:  None.  ANESTHESIA:  General endotracheal supervised by Dr. Rod Mae.  ESTIMATED BLOOD LOSS:  Less than 100 mL.  DRAINS LEFT IN:  None.  SPECIMEN:  Biopsy of the vaginal tumor.  INDICATION FOR PROCEDURE:  Pam Vazquez is a 68 year old white female, who sees Dr. Matthias Hughs of [facility] who has had progressive pelvic pain and evidence of colonic obstruction.  She had an endometrial carcinoma originally resected in February 2013, but this had evidence of recurrence in June 2014, has progressed despite treatment with chemotherapy and radiation therapy.  She now comes for a diverting colostomy.  She has been in contact with the [facility] about possible pelvic exoneration but does not plan to do that at this time.  Indications, potential risks of surgery were explained to the patient.  The potential risks include but not limited to infection, bleeding, continued pelvic pain, and possible continued bowel obstruction problems.  OPERATIVE NOTE:  The patient was placed in a supine position in room #1 at [facility].  She had a right arm tucked.  She had a Foley catheter placed.  She had general anesthesia supervised by Dr. Rod Mae.  Her abdomen was prepped with ChloraPrep and sterilely draped.  A time-out was held and surgical checklist run.  I  accessed her abdominal cavity through the left upper quadrant with 5-mm Optiview trocar.  I placed 2 additional trocars, a 5 mm in the right mid abdomen and a 12-mm trocar in the right lower quadrant.  Abdominal exploration revealed right and left lobes of liver looked okay, the stomach looked okay, the small bowel looked okay, there was no evidence of any carcinomatosis or tumor outside the pelvis that I could see.  In her pelvis though she had a firm, hard mass, approximately 7 cm fixed in the pelvis with the distal sigmoid colon involved in this mass.  A photo was taken of this and this mass was probably 7 or 8 cm in size involving the distal sigmoid colon.  I mobilized her sigmoid colon and the left colon up to the splenic flexure, and I was able to get the sigmoid/left colon to midline.  I picked the place in the colon where it looked like that I could bring both the mucus fistula and the end colostomy up which was sort of the proximal sigmoid colon.  I divided this with a 60-mm blue Endo-GIA stapler and divided the mesentery down towards the retroperitoneum.  Pam Vazquez had marked the patient for me preoperatively.  In the left lower quadrant,  I placed the mucus fistula.  In the left upper quadrant, I placed the end sigmoid colostomy.  I made an approximate 1.5-cm incision through the skin, divided the anterior rectus fascia.  I pulled the rectus muscle apart dividing the posterior rectus fascia and pulled up these 2 ostomies.  I inspected down with a laparoscope to make sure they were not twisted and it was not.  There was no evidence of any entrapped small bowel.  I then de-sufflated the abdomen, removed the trocars, and matured the colostomies.  The colostomies were matured by sowing the bowel to the anterior rectus fascia with interrupted 3-0 Vicryl sutures.  I then matured the colostomy site with interrupted 3-0 Vicryl sutures trying to evert the mucosa and left about 1-3/4-inch ostomy.   There was no evidence of any obstruction.  I then closed the 3 trocar sites with interrupted 4-0 Monocryl suture. I painted this with a LiquiBand topical glue.    I did not see any tumor on the biopsy intra-abdominally.  I then did a vaginal inspection.  Pam Vazquez had a 1.2 cm tumor in the distal vagina posteriorly.  I clipped a piece of this all to sent for pathology.  The patient tolerated the procedure well.  She was transported to the recovery room in good condition.  Sponge and needle count were correct at the end of the case.  She will be kept here until her colostomy is functioning.   Fenton Malling. [name], M.D., FACS   DHN/MEDQ  D:  09/23/2014  T:  09/24/2014  Job:  [id number]  cc:   Barbaraann Rondo, M.D. Fax: [phone]  Matthias Hughs, MD Fax: [place]. [name], MD  Blair Promise, Ph.D., M.D. Fax: [phone]  [name], MD  Gordy Levan, M.D. Fax: [phone]

## 2014-09-24 NOTE — Plan of Care (Signed)
Problem: Phase I Progression Outcomes Goal: Tubes/drains patent Outcome: Completed/Met Date Met:  09/24/14 Goal: Vital signs/hemodynamically stable Outcome: Completed/Met Date Met:  09/24/14

## 2014-09-25 DIAGNOSIS — D649 Anemia, unspecified: Secondary | ICD-10-CM

## 2014-09-25 DIAGNOSIS — I10 Essential (primary) hypertension: Secondary | ICD-10-CM

## 2014-09-25 DIAGNOSIS — C541 Malignant neoplasm of endometrium: Principal | ICD-10-CM

## 2014-09-25 DIAGNOSIS — E119 Type 2 diabetes mellitus without complications: Secondary | ICD-10-CM

## 2014-09-25 MED ORDER — OXYCODONE HCL 5 MG PO TABS
10.0000 mg | ORAL_TABLET | ORAL | Status: DC | PRN
  Administered 2014-09-25 – 2014-09-26 (×3): 20 mg via ORAL
  Filled 2014-09-25 (×3): qty 4

## 2014-09-25 MED ORDER — FENTANYL 75 MCG/HR TD PT72
75.0000 ug | MEDICATED_PATCH | TRANSDERMAL | Status: DC
  Administered 2014-09-25: 75 ug via TRANSDERMAL
  Filled 2014-09-25: qty 1

## 2014-09-25 NOTE — Plan of Care (Signed)
Problem: Phase II Progression Outcomes Goal: Surgical site without signs of infection Outcome: Progressing Goal: Tolerating diet Outcome: Progressing

## 2014-09-25 NOTE — Progress Notes (Signed)
Medical Oncology  Appreciate Dr [name] letting me know of admission and findings at surgery. Stopped by to see patient now, however she and family member are both sound asleep.  Spoke with RN.  I will try to come back by -  She is scheduled back to me on 12-7, tho we can move this if needed. I will let Dr [name] know operative findings.  thanks  L.[name], MD

## 2014-09-25 NOTE — Progress Notes (Signed)
General Surgery Note  LOS: 2 days  POD -  2 Days Post-Op  Assessment/Plan: 1.  LAPAROSCOPIC END COLOSTOMY MUCOUS FISTULA, BIOPSY OF VAGINAL TUMOR - 09/23/2014 - D. [place] Abts are doing well.  She tolerated reg diet.  Biggest complaint is controlling pain.  Dr. [name] has increased the frequency of her oral pain meds. 2.  Recurrent endometrial cancer - obstructing distal colon. 3.  DVT prophylaxis - SQ Heparin 3.  Anemia - Hgb 9.5 - 09/24/2014   Active Problems:   Colonic obstruction  Subjective:  She tolerated diet well.  Seems to be doing well.  Daughter is in the room with her.  She is having some trouble controlling pelvic pain.  Objective:   Filed Vitals:   09/25/14 0545  BP: 126/73  Pulse: 79  Temp: 98 F (36.7 C)  Resp: 18     Intake/Output from previous day:  11/12 0701 - 11/13 0700 In: 2122.5 [P.O.:120; I.V.:2002.5] Out: 1550 [Urine:1450; Stool:100]  Intake/Output this shift:  Total I/O In: 320 [I.V.:320] Out: 350 [Urine:350]   Physical Exam:   General: Older WF who is alert and oriented.    HEENT: Normal. Pupils equal. .   Lungs: Clear.  Good inspiratory effort.   Abdomen: Soft   Wound: 2 ostomies look okay.  She has stool out of both ostomies.   Lab Results:     Recent Labs  09/22/14 1525 09/24/14 0443  WBC 8.8 8.9  HGB 10.4* 9.5*  HCT 33.0* 28.2*  PLT 246 207    BMET    Recent Labs  09/24/14 0443  NA 137  K 3.6*  CL 100  CO2 26  GLUCOSE 254*  BUN 11  CREATININE 0.83  CALCIUM 10.2    PT/INR  No results for input(s): LABPROT, INR in the last 72 hours.  ABG  No results for input(s): PHART, HCO3 in the last 72 hours.  Invalid input(s): PCO2, PO2   Studies/Results:  No results found.   Anti-infectives:   Anti-infectives    Start     Dose/Rate Route Frequency Ordered Stop   09/23/14 1143  cefOXitin (MEFOXIN) 2 g in dextrose 5 % 50 mL IVPB     2 g100 mL/hr over 30 Minutes Intravenous On call to O.R. 09/23/14 1143  09/23/14 1400      Alphonsa Overall, MD, FACS Pager: [facility] Office: [phone] 09/25/2014

## 2014-09-25 NOTE — Progress Notes (Signed)
09/25/2014, 12:04 PM  Hospital day 3 Post op day 1 Antibiotics: none Chemotherapy: last 01-2014  Patient seen, with daughter at bedside. I spoke with RN in room, Dr [name] on unit and Dr Claudette Laws by phone.   Subjective: Having same low pelvic/ vaginal pain as previously, not optimal relief with present morphine 4 mg IV and oxycodone 20 mg q 6 hr; she is on duragestic 50 mcg as PTA. She is not having pain at ostomy/ mucous fistula. She has been tolerating regular diet well. She walked in hall yesterday. Ostomy nurse has been very helpful and patient aware that [facility] will assist with ostomy after DC. Is using incentive spirometer. Has not been able to sleep at night in hospital.   ONCOLOGIC HISTORY  Patient had robotic assisted total laparoscopic hysterectomy bilateral salpingo-oophorectomy right pelvic lymph node biopsy on 12/19/2011. 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 There was no involvement of other organs lymphovascular invasion was not identified. 3 lymph nodes were negative for metastatic disease. 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  Patient was seen by Dr. [name] on 04/29/2013 with a nodule in the vagina concerning for recurrence as well as 8 cm rectal mass. CT of the abdomen and pelvis showed a lobular mass at the vagina cuff measuring 7.0 x 3.2 cm proximally 4 cm craniocaudal dimension, with compression of the distal aspect of the sigmoid colon just above the rectum . There was also noted to be a large necrotic lymph node measuring 2.8 cm within the sigmoid mesocolon centrally. There are also small bilateral external iliac lymph nodes. There were no aggressive osseous lesions. 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  Patient received 6 cycles of taxol carboplatin from 06/03/2013 thru 27/25//3664, complicated by some peripheral neuropathy. There was an interval decrease in tumor burden therefore Dr. [name] recommended 3 additional cycles of Taxotere/Carboplatin, received from 11/27/13 through 01/15/14.  CT of the chest, abdomen, and pelvis on 02/10/14 demonstrated disease progression. She had radiation by Dr Sondra Come, [place] given from 02/23/14 thru 03-27-14. PET was obtained in September 2015. That demonstrates highly metabolic activity in the central pelvis involving the sigmoid colon, with rectovaginal mass confirmed on exam by gyn oncology. Patient declined total pelvic exenteration. Colonoscopy 09-08-14 confirmed near complete rectal obstruction from external mass. Diverting colostomy was done electively by Dr [name] on 09-24-14, with no disease visualized outside of pelvis.    Objective: Vital signs in last 24 hours: Blood pressure 126/73, pulse 79, temperature 98 F (36.7 C), temperature source Oral, resp. rate 18, height 5' (1.524 m), weight 120 lb 7.8 oz (54.654 kg), SpO2 98 %.   Intake/Output from previous day: 11/12 0701 - 11/13 0700 In: 2122.5 [P.O.:120; I.V.:2002.5] Out: 1550 [Urine:1450; Stool:100] Intake/Output this shift: Total I/O In: 320 [I.V.:320] Out: 350 [Urine:350]  Physical exam: awake, alert, changes position easily in bed, NAD. Respirations not labored RA. PERRL. Lungs clear to bases anteriorly/ posteriorly. Heart RRR. Peripheral IV LUE site ok. Abdomen + BS, gas in ostomy bag. LE no edema, cords, tenderness. Moves all extremities easily. PAC is not accessed, site ok.  Lab Results:  Recent Labs  09/22/14 1525 09/24/14 0443  WBC 8.8 8.9  HGB 10.4* 9.5*  HCT 33.0* 28.2*  PLT 246 207  BMET  Recent Labs  09/24/14 0443  NA 137  K 3.6*  CL 100  CO2 26  GLUCOSE 254*  BUN 11  CREATININE 0.83  CALCIUM 10.2     Studies/Results: No results found. Per Dr Saralyn Pilar, vaginal biopsy is malignant, as we anticipated. She had full pathology work up previously, tissue now available if further testing desired.    Assessment/Plan: 1.endometrial carcinoma IA, grade 1 with no high risk features at surgery 12-2011, then recurrent to vagina 04-2013 and now progressive in pelvis despite chemo and RT. 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 Low pelvic pain related to tumor involvement: changed oxycodone to q 4 hrs, may need to increase duragesic also. 2.PAC in, which is fine to use in hospital if needed. 3.multifactoria anemia: a little lower today, may be dilutional, follow 4.diabetes, HTN,hx nephrolithiasis, left breast biopsies  5.flu vaccine done 6.advance directives in place 7.good EF by echocardiogram 07-23-14  Patient will call my office to let us know how she is next week if DC this weekend. She is to see me 12-7. Please call this weekend if our service can help  Thank you LIVESAY,LENNIS P  Pager [phone] Office [phone]

## 2014-09-26 MED ORDER — FENTANYL 75 MCG/HR TD PT72: 75.0000 ug | MEDICATED_PATCH | TRANSDERMAL | Status: DC

## 2014-09-26 NOTE — Discharge Instructions (Signed)
[facility] - DISCHARGE INSTRUCTIONS TO PATIENT  Activity:  Driving - May drive in 4 or 5 days, if doing well.   Lifting - No lifting >15 pounds for 10 days, then no limit  Wound Care:   May shower  Diet:  As tolerated  Follow up appointment:  Call Dr. Pollie Friar office [facility]) at [phone] for an appointment in 2 to 3 weeks.  Medications and dosages:  Resume your home medications.  You have a prescription for:  durgesic patches and oxycodone  Call Dr. [name] or his office  [phone]) if you have:  Temperature greater than 100.4,  Persistent nausea and vomiting,  Severe uncontrolled pain,  Redness, tenderness, or signs of infection (pain, swelling, redness, odor or green/yellow discharge around the site),  Difficulty breathing, headache or visual disturbances,  Any other questions or concerns you may have after discharge.  In an emergency, call 911 or go to an Emergency Department at a nearby hospital.

## 2014-09-26 NOTE — Discharge Summary (Signed)
Reviewed discharge instructions with patient and her daughter including follow up appointments, medications, and ostomy care.  Reviewed [facility] patient education handout Ostomy Care including diet, pouch changes, pouch application, peri-ostomy skin care and treatment for irritation, normal stoma appearance and when to seek treatment, and other items as included in educational handout.  Supervised pt emptying pouch into toilet.  Pt verbalizing and demonstrating good understanding.  Pt sent home with ostomy supplies including 2-part pouch change, extra pouches, adhesive remover, skin prep, skin powder for irritation, and pictoral instructions for pouch change.  Home health nurse to follow up.  Discussed pain management with pt and concerns about her requiring IV meds.  Also made Dr. [name] aware (when he assessed pt for possible discharge) of pt's pain med history over last 24 hours and need for meds today.  Dr. [name] said he was increasing Fentanyl patch dosage and sending pt home with Oxycodone.  Pt/daughter verbalized that they thought pt would be able to manage her pain with this regimen.  Did not have concerns about this POC.  All questions answered.  Pt being discharged into care of daughter.

## 2014-09-26 NOTE — Progress Notes (Signed)
General Surgery Note  LOS: 3 days  POD -  3 Days Post-Op  Assessment/Plan: 1.  LAPAROSCOPIC END COLOSTOMY MUCOUS FISTULA, BIOPSY OF VAGINAL TUMOR - 09/23/2014 - D. [place] Abts are doing well.  She tolerated reg diet.  Biggest complaint is controlling pain.    Discussed going home today vs staying one more day.  She wants to try to go home.  2.  Recurrent endometrial cancer - obstructing distal colon. 3.  DVT prophylaxis - SQ Heparin 3.  Anemia - Hgb 9.5 - 09/24/2014   Active Problems:   Colonic obstruction  Subjective:  We talked about going home.  She tolerated diet well.  Daughter, Lattie Haw, is in the room with her.  She is having some trouble controlling pelvic pain.  Objective:   Filed Vitals:   09/26/14 0545  BP: 131/76  Pulse: 81  Temp: 98.5 F (36.9 C)  Resp: 16     Intake/Output from previous day:  11/13 0701 - 11/14 0700 In: 2400 [P.O.:480; I.V.:1920] Out: 575 [Urine:350; Stool:225]  Intake/Output this shift:      Physical Exam:   General: Older WF who is alert and oriented.    HEENT: Normal. Pupils equal. .   Lungs: Clear.  Good inspiratory effort.   Abdomen: Soft   Wound: 2 ostomies look okay and has output.   Lab Results:     Recent Labs  09/24/14 0443  WBC 8.9  HGB 9.5*  HCT 28.2*  PLT 207    BMET    Recent Labs  09/24/14 0443  NA 137  K 3.6*  CL 100  CO2 26  GLUCOSE 254*  BUN 11  CREATININE 0.83  CALCIUM 10.2    PT/INR  No results for input(s): LABPROT, INR in the last 72 hours.  ABG  No results for input(s): PHART, HCO3 in the last 72 hours.  Invalid input(s): PCO2, PO2   Studies/Results:  No results found.   Anti-infectives:   Anti-infectives    Start     Dose/Rate Route Frequency Ordered Stop   09/23/14 1143  cefOXitin (MEFOXIN) 2 g in dextrose 5 % 50 mL IVPB     2 g100 mL/hr over 30 Minutes Intravenous On call to O.R. 09/23/14 1143 09/23/14 1400      Alphonsa Overall, MD, FACS Pager: [place]  [facility] Office: [phone] 09/26/2014

## 2014-09-26 NOTE — Discharge Summary (Signed)
Physician Discharge Summary  Patient ID:  Pam Vazquez  MRN: [id number]  DOB/AGE: 67/04/1946 67 y.o.  Admit date: 09/23/2014 Discharge date: 09/26/2014  Discharge Diagnoses:  1.  Distal colonic obstruction secondary to advanced endometrial cancer 2. Recurrent endometrial cancer - obstructing distal colon.  Sees Dr. [name] for oncology. 3. Anemia - Hgb 9.5 - 09/24/2014   Active Problems:   Colonic obstruction  Operation: Procedure(s): LAPAROSCOPIC END COLOSTOMY MUCOUS FISTULA, BIOPSY OF VAGINAL TUMOR on 09/23/2014 - [facility]  Discharged Condition: good  Hospital Course: Pam Vazquez is an 68 y.o. female whose primary care physician is [name],Tevon Berhane B, MD and who was admitted 09/23/2014 with a chief complaint of distal colonic obstruction secondary to recurrent endometrial cancer.   She was brought to the operating room on 09/23/2014 and underwent  LAPAROSCOPIC END COLOSTOMY MUCOUS FISTULA, BIOPSY OF VAGINAL TUMOR.   She has done well from the surgery.  Maudie Flakes, ostomy nurse, has assisted in teaching her.  Pam Vazquez is still having pain in the pelvis - this is more likely secondary to the endometrial cancer and not the obstruction.  She is ready to go home. The discharge instructions were reviewed with the patient.  Consults: Oncology  Significant Diagnostic Studies: Results for orders placed or performed during the hospital encounter of 09/23/14  Glucose, capillary  Result Value Ref Range   Glucose-Capillary 118 (H) 70 - 99 mg/dL  Basic metabolic panel  Result Value Ref Range   Sodium 137 137 - 147 mEq/L   Potassium 3.6 (L) 3.7 - 5.3 mEq/L   Chloride 100 96 - 112 mEq/L   CO2 26 19 - 32 mEq/L   Glucose, Bld 254 (H) 70 - 99 mg/dL   BUN 11 6 - 23 mg/dL   Creatinine, Ser 0.83 0.50 - 1.10 mg/dL   Calcium 10.2 8.4 - 10.5 mg/dL   GFR calc non Af Amer 71 (L) >90 mL/min   GFR calc Af Amer 83 (L) >90 mL/min   Anion gap 11 5 - 15  CBC  Result Value Ref Range    WBC 8.9 4.0 - 10.5 K/uL   RBC 2.95 (L) 3.87 - 5.11 MIL/uL   Hemoglobin 9.5 (L) 12.0 - 15.0 g/dL   HCT 28.2 (L) 36.0 - 46.0 %   MCV 95.6 78.0 - 100.0 fL   MCH 32.2 26.0 - 34.0 pg   MCHC 33.7 30.0 - 36.0 g/dL   RDW 13.8 11.5 - 15.5 %   Platelets 207 150 - 400 K/uL    No results found.  Discharge Exam:  Filed Vitals:   09/26/14 0545  BP: 131/76  Pulse: 81  Temp: 98.5 F (36.9 C)  Resp: 16    General: WN older WF who is alert.  Lungs: Clear to auscultation and symmetric breath sounds. Heart:  RRR. No murmur or rub. Abdomen: Soft. Normal bowel sounds. LUQ ostomy is functioning well.  Discharge Medications:     Medication List    TAKE these medications        acetaminophen 500 MG tablet  Commonly known as:  TYLENOL  Take 500 mg by mouth every 6 (six) hours as needed for mild pain or headache.     amLODipine 2.5 MG tablet  Commonly known as:  NORVASC  Take 2.5 mg by mouth every morning.     bisacodyl 5 MG EC tablet  Commonly known as:  DULCOLAX  Take 10 mg by mouth daily as needed for moderate constipation.  cyanocobalamin 2000 MCG tablet  Take 2,000 mcg by mouth 2 (two) times daily.     docusate sodium 100 MG capsule  Commonly known as:  COLACE  Take 100 mg by mouth 2 (two) times daily as needed for mild constipation.     fentaNYL 50 MCG/HR  Commonly known as:  DURAGESIC - dosed mcg/hr  Place 1 patch (50 mcg total) onto the skin every 3 (three) days. Do not drive if drowsy or sedated from medication     fentaNYL 75 MCG/HR  Commonly known as:  DURAGESIC - dosed mcg/hr  Place 1 patch (75 mcg total) onto the skin every 3 (three) days.     gabapentin 100 MG capsule  Commonly known as:  NEURONTIN  TAKE TWO CAPSULES THREE TIMES DAILY     lidocaine-prilocaine cream  Commonly known as:  EMLA  Apply topically as needed.     loratadine 10 MG tablet  Commonly known as:  CLARITIN  Take 10 mg by mouth daily as needed for allergies. Take as needed      megestrol 400 MG/10ML suspension  Commonly known as:  MEGACE  Take 400 mg by mouth 2 (two) times daily.     metoprolol 50 MG tablet  Commonly known as:  LOPRESSOR  Take 50 mg by mouth every morning.     mirtazapine 30 MG tablet  Commonly known as:  REMERON  Take 15-30 mg by mouth at bedtime.     multivitamin with minerals Tabs tablet  Take 1 tablet by mouth every morning. She takes Dance movement psychotherapist.     OxyCODONE HCl (Abuse Deter) 5 MG Taba  Take 1-2 tablets by mouth every 4 (four) hours as needed (pain.).     Oxycodone HCl 10 MG Tabs  Take 1-2 tablets (10-20 mg total) by mouth every 6 (six) hours as needed.     polyethylene glycol packet  Commonly known as:  MIRALAX / GLYCOLAX  Take 17 g by mouth 3 (three) times daily as needed for mild constipation or moderate constipation.     Vitamin D-3 5000 UNITS Tabs  Take 1 tablet by mouth every morning.        Disposition: 01-Home or Self Care      Discharge Instructions    Ambulatory referral to Home Health    Complete by:  As directed   Please evaluate Pam Vazquez for admission to [facility].  Disciplines requested: Nursing  Services to provide: [facility]  Physician to follow patient's care (the person listed here will be responsible for signing ongoing orders): Other: D. [employer] of Care Date: Within 2-3 days  I certify that this patient is under my care and that I, or a Nurse Practitioner or Physician's Assistant working with me, had a face-to-face encounter that meets the physician face-to-face requirements with patient on 11/14/205. The encounter with the patient was in whole, or in part for the following medical condition(s) which is the primary reason for home health care (List medical condition). Ostomy care  Special Instructions:  None  Does the patient have Medicare or Medicaid?:  Yes  The encounter with the patient was in whole, or in part, for the following medical condition, which is  the primary reason for home health care:  ostomy  Reason for Medically Necessary Home Health Services:  Skilled Nursing- Teaching of Disease Process/Symptom Management  My clinical findings support the need for the above services:  OTHER SEE COMMENTS  I certify that, based  on my findings, the following services are medically necessary home health services:  Nursing  Further, I certify that my clinical findings support that this patient is homebound due to:  Unable to leave home safely without assistance     Diet - low sodium heart healthy    Complete by:  As directed      Increase activity slowly    Complete by:  As directed            Activity:  Driving - May drive in 4 or 5 days, if doing well.   Lifting - No lifting >15 pounds for 10 days, then no limit  Wound Care:   May shower  Diet:  As tolerated  Follow up appointment:  Call Dr. Pollie Friar office [facility]) at [phone] for an appointment in 2 to 3 weeks.      She has an appt with Dr. [name] in about 2 weeks.      Will arrange [place] to see patient.  Medications and dosages:  Resume your home medications.  You have a prescription for:  durgesic patches and oxycodone  Signed: Alphonsa Overall, M.D., [facility] Office:  [phone]  09/26/2014, 9:22 AM  \

## 2014-09-26 NOTE — Care Management Note (Signed)
    Page 1 of 1   09/26/2014     2:43:48 PM CARE MANAGEMENT NOTE 09/26/2014  Patient:  Pam Vazquez, Pam Vazquez   Account Number:  [id number]  Date Initiated:  09/25/2014  Documentation initiated by:  Sunday Spillers  Subjective/Objective Assessment:     Action/Plan:   Anticipated DC Date:  09/26/2014   Anticipated DC Plan:  [place]  CM consult      [facility] Choice  HOME HEALTH   Choice offered to / List presented to:  C-1 Patient        [place] arranged  HH-1 RN      [place].   Status of service:  Completed, signed off Medicare Important Message given?  YES (If response is "NO", the following Medicare IM given date fields will be blank) Date Medicare IM given:  09/25/2014 Medicare IM given by:  [facility] Date Additional Medicare IM given:   Additional Medicare IM given by:    Discharge Disposition:  [place]  Per UR Regulation:  Reviewed for med. necessity/level of care/duration of stay  If discussed at Long Length of Stay Meetings, dates discussed:    Comments:  09/26/14 Darrelle Wiberg RN,BSN NCM [phone] [place].HHRN ORDER FOR OSTOMY TEACHING.TC [facility] OFFICE SPOKE TO [place] AWARE OF REFERRAL,HHRN ORDER, & D/C.

## 2014-09-30 ENCOUNTER — Ambulatory Visit: Payer: Medicare Other | Admitting: Internal Medicine

## 2014-10-06 ENCOUNTER — Telehealth: Payer: Self-pay | Admitting: *Deleted

## 2014-10-06 MED ORDER — FENTANYL 25 MCG/HR TD PT72: MEDICATED_PATCH | TRANSDERMAL | Status: DC

## 2014-10-06 NOTE — Telephone Encounter (Signed)
Pt called asking for refill on her fentanyl patches. Her current dose is 60mcg. She has five 60mcg patches at home. Per Dr. [name], it is okay to give patient five 46mcg patches so she will have a total of 24mcg. Script printed and placed in binder in injection room. Called pt back and let her know. She states her daughter will pick it up this afternoon.

## 2014-10-11 ENCOUNTER — Other Ambulatory Visit: Payer: Self-pay | Admitting: Oncology

## 2014-10-11 DIAGNOSIS — C541 Malignant neoplasm of endometrium: Secondary | ICD-10-CM

## 2014-10-13 ENCOUNTER — Other Ambulatory Visit: Payer: Self-pay | Admitting: *Deleted

## 2014-10-13 ENCOUNTER — Telehealth: Payer: Self-pay | Admitting: Oncology

## 2014-10-13 DIAGNOSIS — C541 Malignant neoplasm of endometrium: Secondary | ICD-10-CM

## 2014-10-13 DIAGNOSIS — G893 Neoplasm related pain (acute) (chronic): Secondary | ICD-10-CM

## 2014-10-13 MED ORDER — OXYCODONE HCL 10 MG PO TABS: ORAL_TABLET | ORAL | Status: DC

## 2014-10-13 NOTE — Telephone Encounter (Signed)
Pt instructed to increase fentanyl patch to a total on 145mcg- will add a 58mcg patch today.   New prescription given for Oxycodone HCI 10mg   1-2 mg every 3-4 hours as needed for pain.  Pt will come to see Dr [name] on Thursday

## 2014-10-13 NOTE — Telephone Encounter (Signed)
per pof to r/s appt-cld & spoke to pt & gave appt time & date

## 2014-10-15 ENCOUNTER — Encounter: Payer: Self-pay | Admitting: Oncology

## 2014-10-15 ENCOUNTER — Ambulatory Visit (HOSPITAL_BASED_OUTPATIENT_CLINIC_OR_DEPARTMENT_OTHER): Payer: Medicare Other | Admitting: Oncology

## 2014-10-15 ENCOUNTER — Ambulatory Visit (HOSPITAL_BASED_OUTPATIENT_CLINIC_OR_DEPARTMENT_OTHER): Payer: Medicare Other

## 2014-10-15 ENCOUNTER — Telehealth: Payer: Self-pay | Admitting: Oncology

## 2014-10-15 ENCOUNTER — Other Ambulatory Visit (HOSPITAL_BASED_OUTPATIENT_CLINIC_OR_DEPARTMENT_OTHER): Payer: Medicare Other

## 2014-10-15 VITALS — BP 150/72 | HR 96 | Temp 99.7°F | Resp 18 | Ht 60.0 in | Wt 121.4 lb

## 2014-10-15 DIAGNOSIS — C541 Malignant neoplasm of endometrium: Secondary | ICD-10-CM

## 2014-10-15 DIAGNOSIS — G893 Neoplasm related pain (acute) (chronic): Secondary | ICD-10-CM

## 2014-10-15 DIAGNOSIS — Z95828 Presence of other vascular implants and grafts: Secondary | ICD-10-CM

## 2014-10-15 DIAGNOSIS — D649 Anemia, unspecified: Secondary | ICD-10-CM

## 2014-10-15 DIAGNOSIS — C7989 Secondary malignant neoplasm of other specified sites: Secondary | ICD-10-CM

## 2014-10-15 DIAGNOSIS — D6481 Anemia due to antineoplastic chemotherapy: Secondary | ICD-10-CM

## 2014-10-15 DIAGNOSIS — Z452 Encounter for adjustment and management of vascular access device: Secondary | ICD-10-CM

## 2014-10-15 LAB — COMPREHENSIVE METABOLIC PANEL (CC13)
ALT: 10 U/L (ref 0–55)
AST: 15 U/L (ref 5–34)
Albumin: 3.3 g/dL — ABNORMAL LOW (ref 3.5–5.0)
Alkaline Phosphatase: 59 U/L (ref 40–150)
Anion Gap: 13 mEq/L — ABNORMAL HIGH (ref 3–11)
BILIRUBIN TOTAL: 0.43 mg/dL (ref 0.20–1.20)
BUN: 17.9 mg/dL (ref 7.0–26.0)
CALCIUM: 10.4 mg/dL (ref 8.4–10.4)
CHLORIDE: 108 meq/L (ref 98–109)
CO2: 22 mEq/L (ref 22–29)
Creatinine: 0.9 mg/dL (ref 0.6–1.1)
EGFR: 71 mL/min/{1.73_m2} — ABNORMAL LOW (ref >90)
Glucose: 142 mg/dl — ABNORMAL HIGH (ref 70–140)
Potassium: 3.3 mEq/L — ABNORMAL LOW (ref 3.5–5.1)
Sodium: 143 mEq/L (ref 136–145)
Total Protein: 6.9 g/dL (ref 6.4–8.3)

## 2014-10-15 LAB — CBC WITH DIFFERENTIAL/PLATELET
BASO%: 0.2 % (ref 0.0–2.0)
Basophils Absolute: 0 10*3/uL (ref 0.0–0.1)
EOS%: 3.4 % (ref 0.0–7.0)
Eosinophils Absolute: 0.3 10*3/uL (ref 0.0–0.5)
HEMATOCRIT: 30.8 % — AB (ref 34.8–46.6)
HGB: 9.7 g/dL — ABNORMAL LOW (ref 11.6–15.9)
LYMPH#: 1.4 10*3/uL (ref 0.9–3.3)
LYMPH%: 18.1 % (ref 14.0–49.7)
MCH: 29.6 pg (ref 25.1–34.0)
MCHC: 31.6 g/dL (ref 31.5–36.0)
MCV: 93.6 fL (ref 79.5–101.0)
MONO#: 0.6 10*3/uL (ref 0.1–0.9)
MONO%: 7.7 % (ref 0.0–14.0)
NEUT#: 5.7 10*3/uL (ref 1.5–6.5)
NEUT%: 70.6 % (ref 38.4–76.8)
Platelets: 257 10*3/uL (ref 145–400)
RBC: 3.29 10*6/uL — ABNORMAL LOW (ref 3.70–5.45)
RDW: 15.2 % — ABNORMAL HIGH (ref 11.2–14.5)
WBC: 8 10*3/uL (ref 3.9–10.3)

## 2014-10-15 MED ORDER — OXYCODONE HCL 10 MG PO TABS: ORAL_TABLET | ORAL | Status: DC

## 2014-10-15 MED ORDER — FENTANYL 75 MCG/HR TD PT72: 150.0000 ug | MEDICATED_PATCH | TRANSDERMAL | Status: DC

## 2014-10-15 MED ORDER — SODIUM CHLORIDE 0.9 % IJ SOLN
10.0000 mL | INTRAMUSCULAR | Status: DC | PRN
  Administered 2014-10-15: 10 mL via INTRAVENOUS
  Filled 2014-10-15: qty 10

## 2014-10-15 MED ORDER — HEPARIN SOD (PORK) LOCK FLUSH 100 UNIT/ML IV SOLN
500.0000 [IU] | Freq: Once | INTRAVENOUS | Status: AC
  Administered 2014-10-15: 500 [IU] via INTRAVENOUS
  Filled 2014-10-15: qty 5

## 2014-10-15 NOTE — Patient Instructions (Addendum)
Increase fentanyl patch to 150 every 3 days  Use oxycodone 10 mg  One to two tablets every 3-4 hours as needed for pain.  Call Dr Mariana Kaufman nurse on Friday 12-4 and Monday 12-7 to let us know how you are doing with the pain.  Fine to use Aleve (lasts ~ 8 hrs) or advil/ ibuprofen (lasts 4-6 hrs), 2-3x daily. Take each dose with food

## 2014-10-15 NOTE — Telephone Encounter (Signed)
, °

## 2014-10-15 NOTE — Progress Notes (Signed)
OFFICE PROGRESS NOTE   10/15/2014   Physicians:David Gwenette Greet (PCP Ledell Noss), W.Brewster, J.Kinard, S.[name], Scarlette Shorts  Dr [facility] office note from earlier today received and reviewed.  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 She did not want to pursue pelvic exenteration, had diverting colostomy by Dr [name] on 09-24-14, and likely will try additional chemotherapy. She saw Dr [name] earlier today, colostomy doing well and will see him back prn. She has more deep pelvic pain, not controlled with present pain regimen.  Home health is still following regularly for the ostomy, some difficulty getting correct supplies due to national shortage. She has some mucous and rectal urge symptoms daily or more often, no bleeding; we have discussed this,  as did Dr Joella Prince ostomy is functioning well and patient is very comfortable with that. Pain is deep pelvis/ vaginal/ rectal area, burning pain at times, does not radiate to legs. She has been unable to sleep x days due to pain, tho I was not aware of this previously. Fentanyl patch was increased to 100 mcg from 75 on 10-13-14; she is using oxycodone 10 mg as often as every 2 hrs, without relief, tells me she "is too sleepy" if she takes 2 tablets = 20 mg of the oxycodone. She denies SOB, is tolerating regular diet well with appetite mostly good, no LE swelling, no other bleeding. She is exhausted from lack of sleep. Daughter is concerned about her being alone at home if "too drowsy from pain medication" -- see below.    PAC in Flu vaccine done  ONCOLOGIC HISTORY Patient had robotic assisted total laparoscopic hysterectomy bilateral salpingo-oophorectomy right pelvic lymph node biopsy on 12/19/2011. 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 There was no involvement of  other organs lymphovascular invasion was not identified. 3 lymph nodes were negative for metastatic disease. 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  Patient was seen by Dr. [name] on 04/29/2013 with a nodule in the vagina concerning for recurrence as well as 8 cm rectal mass. CT of the abdomen and pelvis showed a lobular mass at the vagina cuff measuring 7.0 x 3.2 cm proximally 4 cm craniocaudal dimension, with compression of the distal aspect of the sigmoid colon just above the rectum . There was also noted to be a large necrotic lymph node measuring 2.8 cm within the sigmoid mesocolon centrally. There are also small bilateral external iliac lymph nodes. There were no aggressive osseous lesions. 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  Patient received 6 cycles of taxol carboplatin from 06/03/2013 thru 45/40//9811, complicated by some peripheral neuropathy. There was an interval decrease in tumor burden therefore Dr. [name] recommended 3 additional cycles of Taxotere/Carboplatin, received from 11/27/13 through 01/15/14.  CT of the chest, abdomen, and pelvis on 02/10/14 demonstrated disease progression. She had radiation by Dr Sondra Come, [place] given from 02/23/14 thru 03-27-14. PET was obtained in September 2015. That demonstrates highly metabolic activity in the central pelvis involving the sigmoid colon, with rectovaginal mass confirmed on exam by gyn oncology. Patient declined total pelvic exenteration. Colonoscopy 09-08-14 confirmed near complete rectal obstruction from external mass. She had diverting colostomy by Dr [name] 09-24-14, no disease visualized outside of pelvis.  Review of systems as above, also:  Remainder of 10 point Review of Systems negative.  Objective:  Vital signs in  last 24 hours:  BP 150/72 mmHg  Pulse 96  Temp(Src) 99.7 F (37.6 C) (Oral)  Resp 18  Ht 5' (1.524 m)  Wt 121 lb 6.4 oz (55.067 kg)  BMI 23.71 kg/m2  SpO2 98%  Alert, oriented and appropriate. Ambulatory without assistance. In tears thru much of visit. Daughter very supportive.  HEENT:PERRL, sclerae not icteric. Oral mucosa moist without lesions, posterior pharynx clear.  Neck supple. No JVD.  Lymphatics:no cervical,supraclavicular, axillary or inguinal adenopathy Resp: clear to auscultation bilaterally and normal percussion bilaterally Cardio: regular rate and rhythm. No gallop. GI: soft, nontender, not distended, no mass or organomegaly. Normally active bowel sounds. Surgical incision not remarkable. Colostomy stoma not remarkable, soft formed brown stool in bag. Perineum without yeast or ulcerations. No swelling. Musculoskeletal/ Extremities: without pitting edema, cords, tenderness Neuro: no peripheral neuropathy. Otherwise nonfocal. PSYCH: tearful, but cooperative and appropriate. Skin without rash, ecchymosis, petechiae  Portacath-without erythema or tenderness  Lab Results:  Results for orders placed or performed in visit on 10/15/14  CBC with Differential  Result Value Ref Range   WBC 8.0 3.9 - 10.3 10e3/uL   NEUT# 5.7 1.5 - 6.5 10e3/uL   HGB 9.7 (L) 11.6 - 15.9 g/dL   HCT 30.8 (L) 34.8 - 46.6 %   Platelets 257 145 - 400 10e3/uL   MCV 93.6 79.5 - 101.0 fL   MCH 29.6 25.1 - 34.0 pg   MCHC 31.6 31.5 - 36.0 g/dL   RBC 3.29 (L) 3.70 - 5.45 10e6/uL   RDW 15.2 (H) 11.2 - 14.5 %   lymph# 1.4 0.9 - 3.3 10e3/uL   MONO# 0.6 0.1 - 0.9 10e3/uL   Eosinophils Absolute 0.3 0.0 - 0.5 10e3/uL   Basophils Absolute 0.0 0.0 - 0.1 10e3/uL   NEUT% 70.6 38.4 - 76.8 %   LYMPH% 18.1 14.0 - 49.7 %   MONO% 7.7 0.0 - 14.0 %   EOS% 3.4 0.0 - 7.0 %   BASO% 0.2 0.0 - 2.0 %  Comprehensive metabolic panel (Cmet) - [facility]  Result Value Ref Range   Sodium 143 136 - 145 mEq/L   Potassium 3.3  (L) 3.5 - 5.1 mEq/L   Chloride 108 98 - 109 mEq/L   CO2 22 22 - 29 mEq/L   Glucose 142 (H) 70 - 140 mg/dl   BUN 17.9 7.0 - 26.0 mg/dL   Creatinine 0.9 0.6 - 1.1 mg/dL   Total Bilirubin 0.43 0.20 - 1.20 mg/dL   Alkaline Phosphatase 59 40 - 150 U/L   AST 15 5 - 34 U/L   ALT 10 0 - 55 U/L   Total Protein 6.9 6.4 - 8.3 g/dL   Albumin 3.3 (L) 3.5 - 5.0 g/dL   Calcium 10.4 8.4 - 10.4 mg/dL   Anion Gap 13 (H) 3 - 11 mEq/L   EGFR 71 (L) >90 ml/min/1.73 m2     Studies/Results:  Patient: Pam Vazquez, Pam Vazquez Collected: 09/23/2014 Client: [facility] Accession: [id number] Received: 09/23/2014 Alphonsa Overall REPORT OF SURGICAL PATHOLOGFINAL DIAGNOSIS Diagnosis Vagina, biopsy - POORLY DIFFERENTIATED CARCINOMA. Microscopic Comment There is squamous mucosa with focal involvement by poorly differentiated carcinoma associated with ulceration of the surface. The morphologic features favor poorly differentiated adenocarcinoma, possibly with focal squamous differentiation.  Medications: I have reviewed the patient's current medications. Increase fentanyl patch to 150 mcg q 72 hrs now. Use oxycodone 10  mg 1-2 tablets every 3-4 hrs as needed for breakthru pain. Prescriptions given for change in fentanyl patch dose.  DISCUSSION: I have told patient that I do not think the pain medication is making her sleepy, but rather she is so exhausted that she falls asleep as soon as she is comfortable enough to allow this. I have encouraged family to stay with her or check on her very regularly if they are concerned that she is very sleepy, but she needs to increase the pain medication to achieve adequate pain relief. Patient is to call us with update on 12-4 and on 12-7; I will see her again on 10-21-14. We expect to plan chemotherapy after pain is better controlled. They understand that they can call otherwise prior to next scheduled appointment if needed.  Assessment/Plan:  1.endometrial carcinoma IA, grade 1  with no high risk features at surgery 12-2011, then recurrent to vagina 04-2013 and now progressive in pelvis despite chemo and RT. 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 Low pelvic pain related to tumor involvement: changed oxycodone to q 4 hrs, may need to increase duragesic also. 2.PAC in, which is fine to use in hospital if needed. 3.multifactoria anemia: a little lower today, may be dilutional, follow 4.diabetes, HTN,hx nephrolithiasis, left breast biopsies  5.flu vaccine done 6.advance directives in place 7.good EF by echocardiogram 07-23-14    All questions answered. Patient and daughter understand discussion and are in agreement with plans. TIme spent 25 min including >50% counseling and coordination of care. Chaka Boyson P, MD   10/15/2014, 3:08 PM

## 2014-10-15 NOTE — Patient Instructions (Signed)

## 2014-10-16 ENCOUNTER — Telehealth: Payer: Self-pay

## 2014-10-16 NOTE — Telephone Encounter (Signed)
Pam Vazquez called to let Dr. [name] know that she took Aleve last evening and today as directed in addition to increasing the fentanyl patch to 150 mcg.  She did not have pain today or last night.  She has decreased the number of OxyIR tabs used in 24 hours.   She will call on Monday 10-19-14 with another update as directed by Dr. [name].

## 2014-10-19 ENCOUNTER — Ambulatory Visit: Payer: Medicare Other | Admitting: Oncology

## 2014-10-19 ENCOUNTER — Other Ambulatory Visit: Payer: Medicare Other

## 2014-10-20 NOTE — Telephone Encounter (Signed)
Received VM from patient - she states she forgot to call yesterday to give Dr. [name] an update on her pain. She said today her pain is better. It is down to between a 2 and 3. She states she will see Dr. [name] tomorrow at her scheduled appt.

## 2014-10-21 ENCOUNTER — Other Ambulatory Visit: Payer: Self-pay | Admitting: Oncology

## 2014-10-21 ENCOUNTER — Telehealth: Payer: Self-pay | Admitting: Oncology

## 2014-10-21 ENCOUNTER — Telehealth: Payer: Self-pay | Admitting: *Deleted

## 2014-10-21 ENCOUNTER — Ambulatory Visit (HOSPITAL_BASED_OUTPATIENT_CLINIC_OR_DEPARTMENT_OTHER): Payer: Medicare Other | Admitting: Oncology

## 2014-10-21 ENCOUNTER — Encounter: Payer: Self-pay | Admitting: Oncology

## 2014-10-21 VITALS — BP 134/66 | HR 72 | Temp 98.1°F | Resp 18 | Ht 60.0 in | Wt 127.3 lb

## 2014-10-21 DIAGNOSIS — D649 Anemia, unspecified: Secondary | ICD-10-CM

## 2014-10-21 DIAGNOSIS — C541 Malignant neoplasm of endometrium: Secondary | ICD-10-CM

## 2014-10-21 NOTE — Telephone Encounter (Signed)
, °

## 2014-10-21 NOTE — Telephone Encounter (Signed)
Per staff message and POF I have scheduled appts. Advised scheduler of appts. JMW  

## 2014-10-21 NOTE — Progress Notes (Signed)
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 She had diverting colostomy by Dr [name] 09-24-14, which is functioning well. Pelvic pain has been much better controlled on fentanyl patch at 150 mcg every 72 hours (due change this afternoon), with oxycodone 10 mg ~ 1-2x daily and aleve (with food) ~ 2x daily.  Patient had updated [place] by phone last week re improvement in pain with adjustments in medications 10-15-14.  Patient denies any pain at time of visit, had aleve this AM. Appetite better since pain control improved. Colostomy functioning well. No N/V. Able to sleep at night and not drowsy during day now. No bleeding. No LE swelling. No SOB with present activity level. Ostomy supplies are on backorder at least thru vendor for [place], who is still managing the ostomy care. Patient has no wafers and only a couple of bags at home now. See Discussion below.  PAC in Flu vaccine done  ONCOLOGIC HISTORY Patient had robotic assisted total laparoscopic hysterectomy bilateral salpingo-oophorectomy right pelvic lymph node biopsy on 12/19/2011. 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 There was no involvement of other organs lymphovascular invasion was not identified. 3 lymph nodes were negative for metastatic disease. 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  Patient was seen by Dr. [name] on 04/29/2013 with a nodule in the vagina concerning for recurrence as well as 8 cm rectal mass. CT of the abdomen and pelvis showed a lobular  mass at the vagina cuff measuring 7.0 x 3.2 cm proximally 4 cm craniocaudal dimension, with compression of the distal aspect of the sigmoid colon just above the rectum . There was also noted to be a large necrotic lymph node measuring 2.8 cm within the sigmoid mesocolon centrally. There are also small bilateral external iliac lymph nodes. There were no aggressive osseous lesions. 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  Patient received 6 cycles of taxol carboplatin from 06/03/2013 thru 27/06//2376, complicated by some peripheral neuropathy. There was an interval decrease in tumor burden therefore Dr. [name] recommended 3 additional cycles of Taxotere/Carboplatin, received from 11/27/13 through 01/15/14.  CT of the chest, abdomen, and pelvis on 02/10/14 demonstrated disease progression. She had radiation by Dr Sondra Come, [place] given from 02/23/14 thru 03-27-14. PET was obtained in September 2015. That demonstrates highly metabolic activity in the central pelvis involving the sigmoid colon, with rectovaginal mass confirmed on exam by gyn oncology. Patient declined total pelvic exenteration. Colonoscopy 09-08-14 confirmed near complete rectal obstruction from external mass. She had diverting colostomy by Dr [name] 09-24-14, no disease visualized outside of pelvis.   Review of systems as above, also: Bladder ok. No fever or symptoms of infection. No problems with PAC. No cough. Remainder of 10 point Review of Systems negative.  Objective:  Vital signs in last 24 hours: Weight up 6 lbs to 127. 134/66, 72 regular, 18 not labored RA with O2 sat 99%. 98.1.   Awake, alert, oriented and appropriate. Ambulatory without difficulty, easily on and off exam table. Respirations not labored. No alopecia.   HEENT:PERRL, sclerae not icteric. Oral mucosa moist without  lesions, posterior pharynx clear.  Neck supple. No JVD.  Lymphatics:no  cervical,supraclavicular or inguinal adenopathy Resp: clear to auscultation bilaterally and normal percussion bilaterally Cardio: regular rate and rhythm. No gallop. GI: soft, nontender, not distended, no mass or organomegaly. Normally active bowel sounds. Soft brown stool in ostomy bag. Skin fine around adhesive, which covers mucous fistula (no output there per patient, tho has been passing small mucous from rectum). Musculoskeletal/ Extremities: without pitting edema, cords, tenderness Neuro: no peripheral neuropathy. Otherwise nonfocal. PSYCH crying some as we were addressing ostomy supplies. Skin without rash, ecchymosis, petechiae Portacath-without erythema or tenderness  Lab Results:  Results for orders placed or performed in visit on 10/15/14  CBC with Differential  Result Value Ref Range   WBC 8.0 3.9 - 10.3 10e3/uL   NEUT# 5.7 1.5 - 6.5 10e3/uL   HGB 9.7 (L) 11.6 - 15.9 g/dL   HCT 30.8 (L) 34.8 - 46.6 %   Platelets 257 145 - 400 10e3/uL   MCV 93.6 79.5 - 101.0 fL   MCH 29.6 25.1 - 34.0 pg   MCHC 31.6 31.5 - 36.0 g/dL   RBC 3.29 (L) 3.70 - 5.45 10e6/uL   RDW 15.2 (H) 11.2 - 14.5 %   lymph# 1.4 0.9 - 3.3 10e3/uL   MONO# 0.6 0.1 - 0.9 10e3/uL   Eosinophils Absolute 0.3 0.0 - 0.5 10e3/uL   Basophils Absolute 0.0 0.0 - 0.1 10e3/uL   NEUT% 70.6 38.4 - 76.8 %   LYMPH% 18.1 14.0 - 49.7 %   MONO% 7.7 0.0 - 14.0 %   EOS% 3.4 0.0 - 7.0 %   BASO% 0.2 0.0 - 2.0 %  Comprehensive metabolic panel (Cmet) - [facility]  Result Value Ref Range   Sodium 143 136 - 145 mEq/L   Potassium 3.3 (L) 3.5 - 5.1 mEq/L   Chloride 108 98 - 109 mEq/L   CO2 22 22 - 29 mEq/L   Glucose 142 (H) 70 - 140 mg/dl   BUN 17.9 7.0 - 26.0 mg/dL   Creatinine 0.9 0.6 - 1.1 mg/dL   Total Bilirubin 0.43 0.20 - 1.20 mg/dL   Alkaline Phosphatase 59 40 - 150 U/L   AST 15 5 - 34 U/L   ALT 10 0 - 55 U/L   Total Protein 6.9 6.4 - 8.3 g/dL   Albumin 3.3 (L) 3.5 - 5.0 g/dL   Calcium 10.4 8.4 - 10.4 mg/dL   Anion Gap 13 (H)  3 - 11 mEq/L   EGFR 71 (L) >90 ml/min/1.73 m2     Studies/Results:  No results found. Echo 07-23-14 with good EF, ok for doxil now.  Medications: I have reviewed the patient's current medications. Amounts of patches and pain medication reviewed by RN, as she needs to continue same regimen. Antiemetics to go to pharmacy.  DISCUSSION I have spoken with ostomy RN at [facility] Threasa Beards, pager [phone]; alternate pager for ostomy RN Liston Alba is [phone]) re problems with supplies from [place] (wafers [phone] and bags [id number], which Threasa Beards identifies as order #s from [facility])  Doxil teaching done by RN. Patient and daughter in agreement with beginning this before holidays.  As she has not seen Dr [name] since diverting colostomy, with visual confirmation during that surgery of disease just in pelvis, I have suggested we schedule back to Dr [name] in [place] in ~ Jan. Note patient had not wanted to have TPE when that was discussed based on scans prior to the diverting colostomy. We will proceed with  doxil, but at least have Dr [name] see her as above.   Assessment/Plan: 1.endometrial carcinoma IA, grade 1 with no high risk features at surgery 12-2011, then recurrent to vagina 04-2013 and now progressive in pelvis despite chemo and RT. 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 Low pelvic pain related to tumor involvement, very adequately controlled with present regimen now. First doxil in next 1-2 weeks and I will see her 1-2 weeks after that treatment.  2.PAC in 3.multifactoria anemia: Hgb slightly better at 9.7 today. Oral iron not listed on meds and will confirm, as this may be useful now. 4.diabetes, HTN,hx nephrolithiasis, left breast biopsies  5.flu vaccine done 6.advance directives in place 7.good EF by echocardiogram 07-23-14   All questions answered. Verbal consent obtained.  Chemo orders entered.   Zeva Leber P,  MD   10/21/2014, 9:25 AM

## 2014-10-22 ENCOUNTER — Telehealth: Payer: Self-pay | Admitting: *Deleted

## 2014-10-22 MED ORDER — LORAZEPAM 0.5 MG PO TABS: ORAL_TABLET | ORAL | Status: DC

## 2014-10-22 MED ORDER — ONDANSETRON HCL 8 MG PO TABS
8.0000 mg | ORAL_TABLET | Freq: Three times a day (TID) | ORAL | Status: AC | PRN
Start: 2014-10-22 — End: ?

## 2014-10-22 MED ORDER — FERROUS FUMARATE 325 (106 FE) MG PO TABS: 1.0000 | ORAL_TABLET | Freq: Every day | ORAL | Status: DC

## 2014-10-22 NOTE — Telephone Encounter (Signed)
-----   Message from Gordy Levan, MD sent at 10/22/2014  9:57 AM EST ----- 1.Please ask patient if she is taking oral iron - not on med list. If not, suggest starting this daily on empty stomach with OJ for best absorption, so ~ an hour before meal or at least 2 hrs after meal. RN ask pharmacy if insurance will cover Hemocyte, #30 3 RF if so, otherwise tell pharmacist ferrous fumarate or ferrous gluconate DAW as OTC, ~ 325 mg daily on empty stomach with OJ  2.Also needs antiemetics for upcoming doxil.  I cannot tell in EMR that she has had problems with antiemetics in past, but RN please confirm with patient before sending scripts (generics always fine) for:   Zofran 8 mg q 8 hr prn nausea, will not make drowsy  #30  2 RF     Ativan 0.5 mg  One SL or po q 6 hr prn nausea, will make drowsy  #20 NRF.        thanks

## 2014-10-22 NOTE — Telephone Encounter (Signed)
Pt notified of message below. Is not taking iron currently. Did not use antiemetics previously

## 2014-10-27 ENCOUNTER — Telehealth: Payer: Self-pay | Admitting: Oncology

## 2014-10-27 NOTE — Telephone Encounter (Signed)
, °

## 2014-11-01 ENCOUNTER — Other Ambulatory Visit: Payer: Self-pay | Admitting: Oncology

## 2014-11-03 ENCOUNTER — Other Ambulatory Visit: Payer: Medicare Other

## 2014-11-03 ENCOUNTER — Ambulatory Visit: Payer: Medicare Other | Admitting: Oncology

## 2014-11-03 ENCOUNTER — Ambulatory Visit (HOSPITAL_BASED_OUTPATIENT_CLINIC_OR_DEPARTMENT_OTHER): Payer: Medicare Other | Admitting: Lab

## 2014-11-03 ENCOUNTER — Ambulatory Visit (HOSPITAL_BASED_OUTPATIENT_CLINIC_OR_DEPARTMENT_OTHER): Payer: Medicare Other

## 2014-11-03 DIAGNOSIS — C541 Malignant neoplasm of endometrium: Secondary | ICD-10-CM

## 2014-11-03 DIAGNOSIS — Z5111 Encounter for antineoplastic chemotherapy: Secondary | ICD-10-CM

## 2014-11-03 LAB — CBC WITH DIFFERENTIAL/PLATELET
BASO%: 0.2 % (ref 0.0–2.0)
BASOS ABS: 0 10*3/uL (ref 0.0–0.1)
EOS ABS: 0.1 10*3/uL (ref 0.0–0.5)
EOS%: 1 % (ref 0.0–7.0)
HCT: 32.3 % — ABNORMAL LOW (ref 34.8–46.6)
HGB: 10 g/dL — ABNORMAL LOW (ref 11.6–15.9)
LYMPH#: 1.4 10*3/uL (ref 0.9–3.3)
LYMPH%: 11.5 % — ABNORMAL LOW (ref 14.0–49.7)
MCH: 29.8 pg (ref 25.1–34.0)
MCHC: 31 g/dL — ABNORMAL LOW (ref 31.5–36.0)
MCV: 96.1 fL (ref 79.5–101.0)
MONO#: 0.8 10*3/uL (ref 0.1–0.9)
MONO%: 6.2 % (ref 0.0–14.0)
NEUT#: 9.8 10*3/uL — ABNORMAL HIGH (ref 1.5–6.5)
NEUT%: 81.1 % — ABNORMAL HIGH (ref 38.4–76.8)
Platelets: 241 10*3/uL (ref 145–400)
RBC: 3.36 10*6/uL — ABNORMAL LOW (ref 3.70–5.45)
RDW: 15.6 % — AB (ref 11.2–14.5)
WBC: 12 10*3/uL — ABNORMAL HIGH (ref 3.9–10.3)

## 2014-11-03 LAB — COMPREHENSIVE METABOLIC PANEL (CC13)
ALBUMIN: 3 g/dL — AB (ref 3.5–5.0)
ALT: 12 U/L (ref 0–55)
AST: 15 U/L (ref 5–34)
Alkaline Phosphatase: 71 U/L (ref 40–150)
Anion Gap: 10 mEq/L (ref 3–11)
BUN: 16.6 mg/dL (ref 7.0–26.0)
CALCIUM: 10.4 mg/dL (ref 8.4–10.4)
CO2: 26 mEq/L (ref 22–29)
Chloride: 107 mEq/L (ref 98–109)
Creatinine: 1 mg/dL (ref 0.6–1.1)
EGFR: 62 mL/min/{1.73_m2} — ABNORMAL LOW (ref >90)
Glucose: 155 mg/dl — ABNORMAL HIGH (ref 70–140)
POTASSIUM: 3.6 meq/L (ref 3.5–5.1)
Sodium: 142 mEq/L (ref 136–145)
Total Bilirubin: 0.45 mg/dL (ref 0.20–1.20)
Total Protein: 7 g/dL (ref 6.4–8.3)

## 2014-11-03 MED ORDER — SODIUM CHLORIDE 0.9 % IJ SOLN
10.0000 mL | INTRAMUSCULAR | Status: DC | PRN
  Administered 2014-11-03: 10 mL
  Filled 2014-11-03: qty 10

## 2014-11-03 MED ORDER — DEXAMETHASONE SODIUM PHOSPHATE 10 MG/ML IJ SOLN
INTRAMUSCULAR | Status: AC
  Filled 2014-11-03: qty 1

## 2014-11-03 MED ORDER — ONDANSETRON 8 MG/50ML IVPB (CHCC)
8.0000 mg | Freq: Once | INTRAVENOUS | Status: AC
  Administered 2014-11-03: 8 mg via INTRAVENOUS

## 2014-11-03 MED ORDER — DEXTROSE 5 % IV SOLN
38.0000 mg/m2 | Freq: Once | INTRAVENOUS | Status: AC
  Administered 2014-11-03: 60 mg via INTRAVENOUS
  Filled 2014-11-03: qty 30

## 2014-11-03 MED ORDER — SODIUM CHLORIDE 0.9 % IV SOLN
Freq: Once | INTRAVENOUS | Status: AC
  Administered 2014-11-03: 10:00:00 via INTRAVENOUS

## 2014-11-03 MED ORDER — ONDANSETRON 8 MG/NS 50 ML IVPB
INTRAVENOUS | Status: AC
  Filled 2014-11-03: qty 8

## 2014-11-03 MED ORDER — DEXAMETHASONE SODIUM PHOSPHATE 10 MG/ML IJ SOLN
10.0000 mg | Freq: Once | INTRAMUSCULAR | Status: AC
  Administered 2014-11-03: 10 mg via INTRAVENOUS

## 2014-11-03 MED ORDER — HEPARIN SOD (PORK) LOCK FLUSH 100 UNIT/ML IV SOLN
500.0000 [IU] | Freq: Once | INTRAVENOUS | Status: AC | PRN
  Administered 2014-11-03: 500 [IU]
  Filled 2014-11-03: qty 5

## 2014-11-03 NOTE — Patient Instructions (Signed)
[place] Discharge Instructions for Patients Receiving Chemotherapy  Today you received the following chemotherapy agents Doxcil  To help prevent nausea and vomiting after your treatment, we encourage you to take your nausea medication  Zofran and Compazine as directed   If you develop nausea and vomiting that is not controlled by your nausea medication, call the clinic.   BELOW ARE SYMPTOMS THAT SHOULD BE REPORTED IMMEDIATELY:  *FEVER GREATER THAN 100.5 F  *CHILLS WITH OR WITHOUT FEVER  NAUSEA AND VOMITING THAT IS NOT CONTROLLED WITH YOUR NAUSEA MEDICATION  *UNUSUAL SHORTNESS OF BREATH  *UNUSUAL BRUISING OR BLEEDING  TENDERNESS IN MOUTH AND THROAT WITH OR WITHOUT PRESENCE OF ULCERS  *URINARY PROBLEMS  *BOWEL PROBLEMS  UNUSUAL RASH Items with * indicate a potential emergency and should be followed up as soon as possible.  Feel free to call the clinic you have any questions or concerns. The clinic phone number is [phone].

## 2014-11-04 ENCOUNTER — Telehealth: Payer: Self-pay | Admitting: *Deleted

## 2014-11-04 NOTE — Telephone Encounter (Signed)
REQUESTED A RETURN CALL.

## 2014-11-08 ENCOUNTER — Other Ambulatory Visit: Payer: Self-pay | Admitting: Oncology

## 2014-11-08 DIAGNOSIS — C541 Malignant neoplasm of endometrium: Secondary | ICD-10-CM

## 2014-11-16 ENCOUNTER — Ambulatory Visit (HOSPITAL_BASED_OUTPATIENT_CLINIC_OR_DEPARTMENT_OTHER): Payer: Medicare Other | Admitting: Oncology

## 2014-11-16 ENCOUNTER — Other Ambulatory Visit: Payer: Self-pay | Admitting: Oncology

## 2014-11-16 ENCOUNTER — Encounter: Payer: Self-pay | Admitting: Oncology

## 2014-11-16 ENCOUNTER — Telehealth: Payer: Self-pay | Admitting: Oncology

## 2014-11-16 ENCOUNTER — Other Ambulatory Visit (HOSPITAL_BASED_OUTPATIENT_CLINIC_OR_DEPARTMENT_OTHER): Payer: Medicare Other

## 2014-11-16 VITALS — BP 128/72 | HR 79 | Temp 98.6°F | Resp 18 | Ht 60.0 in | Wt 126.0 lb

## 2014-11-16 DIAGNOSIS — C541 Malignant neoplasm of endometrium: Secondary | ICD-10-CM

## 2014-11-16 DIAGNOSIS — T451X5A Adverse effect of antineoplastic and immunosuppressive drugs, initial encounter: Secondary | ICD-10-CM

## 2014-11-16 DIAGNOSIS — D649 Anemia, unspecified: Secondary | ICD-10-CM

## 2014-11-16 DIAGNOSIS — D500 Iron deficiency anemia secondary to blood loss (chronic): Secondary | ICD-10-CM

## 2014-11-16 DIAGNOSIS — C7989 Secondary malignant neoplasm of other specified sites: Secondary | ICD-10-CM

## 2014-11-16 DIAGNOSIS — N200 Calculus of kidney: Secondary | ICD-10-CM

## 2014-11-16 DIAGNOSIS — R32 Unspecified urinary incontinence: Secondary | ICD-10-CM

## 2014-11-16 DIAGNOSIS — D6481 Anemia due to antineoplastic chemotherapy: Secondary | ICD-10-CM

## 2014-11-16 DIAGNOSIS — G893 Neoplasm related pain (acute) (chronic): Secondary | ICD-10-CM

## 2014-11-16 LAB — COMPREHENSIVE METABOLIC PANEL (CC13)
ALT: 26 U/L (ref 0–55)
ANION GAP: 10 meq/L (ref 3–11)
AST: 19 U/L (ref 5–34)
Albumin: 2.7 g/dL — ABNORMAL LOW (ref 3.5–5.0)
Alkaline Phosphatase: 72 U/L (ref 40–150)
BILIRUBIN TOTAL: 0.2 mg/dL (ref 0.20–1.20)
BUN: 22.9 mg/dL (ref 7.0–26.0)
CO2: 24 mEq/L (ref 22–29)
CREATININE: 0.9 mg/dL (ref 0.6–1.1)
Calcium: 10.7 mg/dL — ABNORMAL HIGH (ref 8.4–10.4)
Chloride: 108 mEq/L (ref 98–109)
EGFR: 62 mL/min/{1.73_m2} — AB (ref >90)
GLUCOSE: 99 mg/dL (ref 70–140)
Potassium: 4.8 mEq/L (ref 3.5–5.1)
Sodium: 143 mEq/L (ref 136–145)
Total Protein: 7.1 g/dL (ref 6.4–8.3)

## 2014-11-16 LAB — CBC WITH DIFFERENTIAL/PLATELET
BASO%: 0.2 % (ref 0.0–2.0)
BASOS ABS: 0 10*3/uL (ref 0.0–0.1)
EOS ABS: 0.2 10*3/uL (ref 0.0–0.5)
EOS%: 1.8 % (ref 0.0–7.0)
HEMATOCRIT: 28.9 % — AB (ref 34.8–46.6)
HEMOGLOBIN: 8.7 g/dL — AB (ref 11.6–15.9)
LYMPH%: 16.2 % (ref 14.0–49.7)
MCH: 28.9 pg (ref 25.1–34.0)
MCHC: 30.1 g/dL — ABNORMAL LOW (ref 31.5–36.0)
MCV: 96 fL (ref 79.5–101.0)
MONO#: 0.8 10*3/uL (ref 0.1–0.9)
MONO%: 7.2 % (ref 0.0–14.0)
NEUT#: 8 10*3/uL — ABNORMAL HIGH (ref 1.5–6.5)
NEUT%: 74.6 % (ref 38.4–76.8)
PLATELETS: 355 10*3/uL (ref 145–400)
RBC: 3.01 10*6/uL — ABNORMAL LOW (ref 3.70–5.45)
RDW: 16 % — ABNORMAL HIGH (ref 11.2–14.5)
WBC: 10.8 10*3/uL — ABNORMAL HIGH (ref 3.9–10.3)
lymph#: 1.7 10*3/uL (ref 0.9–3.3)

## 2014-11-16 LAB — URINALYSIS, MICROSCOPIC - CHCC
BILIRUBIN (URINE): NEGATIVE
BLOOD: NEGATIVE
GLUCOSE UR CHCC: NEGATIVE mg/dL
KETONES: NEGATIVE mg/dL
Leukocyte Esterase: NEGATIVE
Nitrite: NEGATIVE
Protein: <30 mg/dL
RBC / HPF: NEGATIVE (ref 0–2)
Specific Gravity, Urine: 1.015 (ref 1.003–1.035)
Urobilinogen, UR: 0.2 mg/dL (ref 0.2–1)
pH: 6 (ref 4.6–8.0)

## 2014-11-16 MED ORDER — OXYCODONE HCL 10 MG PO TABS: ORAL_TABLET | ORAL | Status: DC

## 2014-11-16 MED ORDER — FENTANYL 100 MCG/HR TD PT72: MEDICATED_PATCH | TRANSDERMAL | Status: DC

## 2014-11-16 MED ORDER — FENTANYL 75 MCG/HR TD PT72: MEDICATED_PATCH | TRANSDERMAL | Status: DC

## 2014-11-16 MED ORDER — FENTANYL 75 MCG/HR TD PT72: 150.0000 ug | MEDICATED_PATCH | TRANSDERMAL | Status: DC

## 2014-11-16 MED ORDER — MEGESTROL ACETATE 400 MG/10ML PO SUSP: 400.0000 mg | Freq: Two times a day (BID) | ORAL | Status: AC

## 2014-11-16 NOTE — Patient Instructions (Signed)
Increase duragesic to 175 micrograms every 72 hours, with one hundred patch + 75 patch.  Let Dr [name] or her nurse know if this does not keep you comfortable for full 3 days.   Continue iron,  let us know if you are more short of breath or more fatigued.

## 2014-11-16 NOTE — Progress Notes (Signed)
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 She previously decided against trying pelvic exenteratio  Patient has done well with first doxil, with no skin irritation and no nausea. Energy has been reasonable and appetite is better. Duragesic patches presently 150 mcg every 72 hrs do not manage pain beyond ~ 2.5 days; she is using prn oxycodone 10 mg with duragesic. DIscomfort is low pelvis. She has had more bladder pressure and some urinary incontinence for ~ past 10 days, without frank dysuria. She denies bleeding. Ostomy supplies will be obtained from her pharmacy after [facility] concludes. She has taken hemocyte since Dec 2015.  PAC in Flu vaccine done  ONCOLOGIC HISTORY Patient had robotic assisted total laparoscopic hysterectomy bilateral salpingo-oophorectomy right pelvic lymph node biopsy on 12/19/2011. 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 There was no involvement of other organs lymphovascular invasion was not identified. 3 lymph nodes were negative for metastatic disease. 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  Patient was seen by Dr. [name] on 04/29/2013 with a nodule in the vagina concerning for recurrence as well as 8 cm rectal mass. CT of the abdomen and pelvis showed a lobular mass at the vagina cuff measuring 7.0 x 3.2 cm proximally 4 cm craniocaudal dimension, with compression of the distal aspect of the sigmoid  colon just above the rectum . There was also noted to be a large necrotic lymph node measuring 2.8 cm within the sigmoid mesocolon centrally. There are also small bilateral external iliac lymph nodes. There were no aggressive osseous lesions. 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  Patient received 6 cycles of taxol carboplatin from 06/03/2013 thru 63/89//3734, complicated by some peripheral neuropathy. There was an interval decrease in tumor burden therefore Dr. [name] recommended 3 additional cycles of Taxotere/Carboplatin, received from 11/27/13 through 01/15/14.  CT of the chest, abdomen, and pelvis on 02/10/14 demonstrated disease progression. She had radiation by Dr Sondra Come, [place] given from 02/23/14 thru 03-27-14. PET was obtained in September 2015. That demonstrates highly metabolic activity in the central pelvis involving the sigmoid colon, with rectovaginal mass confirmed on exam by gyn oncology. Patient declined total pelvic exenteration. Colonoscopy 09-08-14 confirmed near complete rectal obstruction from external mass. She had diverting colostomy by Dr [name] 09-24-14, no disease visualized outside of pelvis.  Review of systems as above, also: No increased SOB, no SOB walking in office today. 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 No LE sweling. Is able to sleep adequately. No bleeding Remainder of 10 point Review of Systems negative.  Objective:  Vital signs in last 24 hours:  BP 128/72 mmHg  Pulse 79  Temp(Src) 98.6 F (37 C) (Oral)  Resp 18  Ht 5' (1.524 m)  Wt 126 lb (57.153 kg)  BMI 24.61 kg/m2 Weight is down 1 lb Alert, oriented and appropriate. Ambulatory without difficulty.  No alopecia  HEENT:PERRL, sclerae not icteric. Oral mucosa moist without lesions, posterior pharynx clear.  Neck supple. No JVD.  Lymphatics:no cervical,supraclavicular, axillary or inguinal  adenopathy Resp: clear to auscultation  bilaterally and normal percussion bilaterally Cardio: regular rate and rhythm. No gallop. GI: soft, nontender, not distended, no mass or organomegaly. Normally active bowel sounds. Surgical incision not remarkable. Soft formed brown stool in ostomy bag, no skin irritation around adhesive. LE no edema, cords, tenderness. Musculoskeletal/ Extremities: without pitting edema, cords, tenderness Neuro: no peripheral neuropathy. Otherwise nonfocal Skin without rash, ecchymosis, petechiae  Portacath-without erythema or tenderness  Lab Results:  Results for orders placed or performed in visit on 11/16/14  CBC with Differential  Result Value Ref Range   WBC 10.8 (H) 3.9 - 10.3 10e3/uL   NEUT# 8.0 (H) 1.5 - 6.5 10e3/uL   HGB 8.7 (L) 11.6 - 15.9 g/dL   HCT 28.9 (L) 34.8 - 46.6 %   Platelets 355 145 - 400 10e3/uL   MCV 96.0 79.5 - 101.0 fL   MCH 28.9 25.1 - 34.0 pg   MCHC 30.1 (L) 31.5 - 36.0 g/dL   RBC 3.01 (L) 3.70 - 5.45 10e6/uL   RDW 16.0 (H) 11.2 - 14.5 %   lymph# 1.7 0.9 - 3.3 10e3/uL   MONO# 0.8 0.1 - 0.9 10e3/uL   Eosinophils Absolute 0.2 0.0 - 0.5 10e3/uL   Basophils Absolute 0.0 0.0 - 0.1 10e3/uL   NEUT% 74.6 38.4 - 76.8 %   LYMPH% 16.2 14.0 - 49.7 %   MONO% 7.2 0.0 - 14.0 %   EOS% 1.8 0.0 - 7.0 %   BASO% 0.2 0.0 - 2.0 %  Comprehensive metabolic panel (Cmet) - [facility]  Result Value Ref Range   Sodium 143 136 - 145 mEq/L   Potassium 4.8 3.5 - 5.1 mEq/L   Chloride 108 98 - 109 mEq/L   CO2 24 22 - 29 mEq/L   Glucose 99 70 - 140 mg/dl   BUN 22.9 7.0 - 26.0 mg/dL   Creatinine 0.9 0.6 - 1.1 mg/dL   Total Bilirubin 0.20 0.20 - 1.20 mg/dL   Alkaline Phosphatase 72 40 - 150 U/L   AST 19 5 - 34 U/L   ALT 26 0 - 55 U/L   Total Protein 7.1 6.4 - 8.3 g/dL   Albumin 2.7 (L) 3.5 - 5.0 g/dL   Calcium 10.7 (H) 8.4 - 10.4 mg/dL   Anion Gap 10 3 - 11 mEq/L   EGFR 62 (L) >90 ml/min/1.73 m2    UA, C&S pending with bladder symptoms, tho these may be due to tumor in pelvis and not  UTI  Studies/Results:  No results found.  Medications: I have reviewed the patient's current medications. Will increase duragesic patch to 175 every 72 hrs, which hopefully will hold for the full 72 hours.   DISCUSSION: patient prefers to continue doxil rather than talking again with Dr [name] about pelvic exenteration, as she still does not want to consider that surgery even given no disease outside of pelvis at diverting colostomy by Dr [name]. She and daughters are pleased with how well she has tolerated first doxil, and understand that skin reaction usually is more cumulative with additional doses, tho still can be managed.  Assessment/Plan: 1.endometrial carcinoma IA, grade 1 with no high risk features at surgery 12-2011, then recurrent to vagina 04-2013 and  progressive in pelvis despite chemo and RT. 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 Did well with first doxil 11-03-14.Low pelvic pain related to tumor involvement, duragesic increased as above as 150 mcg not holding for full 72 hours. She will  have #2 doxil on 12-01-14 as long as Enetai >=1.5 and plt >=100k; I will see her ~ 12-17-14. At her request, have cancelled gyn onc visit for next week. 2.PAC in 3.multifactoria anemia: Hgb lower today, but not clearly symptomatic and no overt bleeding. Continue oral iron and follow. Check iron studies with next labs (last April 2015 serum iron 34 and %sat 13). 4.nephrolithiasis, left breast biopsies  5.flu vaccine done 6.advance directives in place 7.good EF by echocardiogram 07-23-14 8.urinary pressure and some incontinence: UA C&S pending, tho may be from metastatic disease to pelvis. All questions answered. Patient in agreement with plans and daughters very supportive of her wishes. Chemo orders entered. Time spent 25 min including >50% counseling and coordination of care.    Gordy Levan, MD   11/16/2014, 3:44 PM

## 2014-11-16 NOTE — Telephone Encounter (Signed)
, °

## 2014-11-17 ENCOUNTER — Telehealth: Payer: Self-pay | Admitting: *Deleted

## 2014-11-17 NOTE — Telephone Encounter (Signed)
Per staff message and POF I have scheduled appts. Advised scheduler of appts and to move lab appts  JMW  

## 2014-11-18 LAB — URINE CULTURE

## 2014-11-20 ENCOUNTER — Telehealth: Payer: Self-pay

## 2014-11-20 NOTE — Telephone Encounter (Signed)
-----   Message from Patton Salles, RN sent at 11/19/2014  4:04 PM EST -----   ----- Message -----    From: Gordy Levan, MD    Sent: 11/19/2014   8:27 AM      To: Patton Salles, RN  Please let her know no urine infection  How is she doing with increase in duragesic?

## 2014-11-20 NOTE — Telephone Encounter (Signed)
Told Pam Vazquez that the urine culture was negative for an infection as noted below by Dr. [name]. She stated that she is much more comfortable with the Duragesic patch at 175 mcg.  Her pain currently is a 2/10.  Sh took an Oxycodone tablet this am.  Suggested that she take another one ~now  To stay ahead of the pain.  She stated that she will take one shortly.  Patient appreciated the follow up call.

## 2014-11-26 ENCOUNTER — Ambulatory Visit: Payer: Medicare Other | Admitting: Gynecologic Oncology

## 2014-11-30 ENCOUNTER — Telehealth: Payer: Self-pay | Admitting: Oncology

## 2014-11-30 NOTE — Telephone Encounter (Signed)
pt cld to get appt time & date-gave pt appt time & date

## 2014-12-01 ENCOUNTER — Other Ambulatory Visit: Payer: Medicare Other

## 2014-12-01 ENCOUNTER — Other Ambulatory Visit (HOSPITAL_BASED_OUTPATIENT_CLINIC_OR_DEPARTMENT_OTHER): Payer: Medicare Other

## 2014-12-01 ENCOUNTER — Other Ambulatory Visit: Payer: Self-pay | Admitting: *Deleted

## 2014-12-01 ENCOUNTER — Ambulatory Visit (HOSPITAL_BASED_OUTPATIENT_CLINIC_OR_DEPARTMENT_OTHER): Payer: Medicare Other

## 2014-12-01 DIAGNOSIS — Z5111 Encounter for antineoplastic chemotherapy: Secondary | ICD-10-CM

## 2014-12-01 DIAGNOSIS — C541 Malignant neoplasm of endometrium: Secondary | ICD-10-CM

## 2014-12-01 DIAGNOSIS — Z452 Encounter for adjustment and management of vascular access device: Secondary | ICD-10-CM

## 2014-12-01 DIAGNOSIS — C7989 Secondary malignant neoplasm of other specified sites: Secondary | ICD-10-CM

## 2014-12-01 DIAGNOSIS — D500 Iron deficiency anemia secondary to blood loss (chronic): Secondary | ICD-10-CM

## 2014-12-01 DIAGNOSIS — D6481 Anemia due to antineoplastic chemotherapy: Secondary | ICD-10-CM

## 2014-12-01 DIAGNOSIS — T451X5A Adverse effect of antineoplastic and immunosuppressive drugs, initial encounter: Secondary | ICD-10-CM

## 2014-12-01 DIAGNOSIS — D509 Iron deficiency anemia, unspecified: Secondary | ICD-10-CM

## 2014-12-01 DIAGNOSIS — R32 Unspecified urinary incontinence: Secondary | ICD-10-CM

## 2014-12-01 DIAGNOSIS — G893 Neoplasm related pain (acute) (chronic): Secondary | ICD-10-CM

## 2014-12-01 LAB — CBC WITH DIFFERENTIAL/PLATELET
BASO%: 0.2 % (ref 0.0–2.0)
Basophils Absolute: 0 10*3/uL (ref 0.0–0.1)
EOS%: 1.1 % (ref 0.0–7.0)
Eosinophils Absolute: 0.1 10*3/uL (ref 0.0–0.5)
HCT: 31.7 % — ABNORMAL LOW (ref 34.8–46.6)
HEMOGLOBIN: 9.6 g/dL — AB (ref 11.6–15.9)
LYMPH%: 17.4 % (ref 14.0–49.7)
MCH: 29 pg (ref 25.1–34.0)
MCHC: 30.3 g/dL — ABNORMAL LOW (ref 31.5–36.0)
MCV: 95.8 fL (ref 79.5–101.0)
MONO#: 0.9 10*3/uL (ref 0.1–0.9)
MONO%: 7.5 % (ref 0.0–14.0)
NEUT#: 9.1 10*3/uL — ABNORMAL HIGH (ref 1.5–6.5)
NEUT%: 73.8 % (ref 38.4–76.8)
PLATELETS: 255 10*3/uL (ref 145–400)
RBC: 3.31 10*6/uL — ABNORMAL LOW (ref 3.70–5.45)
RDW: 16.7 % — ABNORMAL HIGH (ref 11.2–14.5)
WBC: 12.3 10*3/uL — ABNORMAL HIGH (ref 3.9–10.3)
lymph#: 2.1 10*3/uL (ref 0.9–3.3)

## 2014-12-01 LAB — COMPREHENSIVE METABOLIC PANEL (CC13)
ALBUMIN: 3.1 g/dL — AB (ref 3.5–5.0)
ALT: 11 U/L (ref 0–55)
AST: 15 U/L (ref 5–34)
Alkaline Phosphatase: 69 U/L (ref 40–150)
Anion Gap: 11 mEq/L (ref 3–11)
BILIRUBIN TOTAL: 0.23 mg/dL (ref 0.20–1.20)
BUN: 19.6 mg/dL (ref 7.0–26.0)
CO2: 25 meq/L (ref 22–29)
Calcium: 10.1 mg/dL (ref 8.4–10.4)
Chloride: 107 mEq/L (ref 98–109)
Creatinine: 1.1 mg/dL (ref 0.6–1.1)
EGFR: 51 mL/min/{1.73_m2} — AB (ref >90)
GLUCOSE: 127 mg/dL (ref 70–140)
Potassium: 3.6 mEq/L (ref 3.5–5.1)
Sodium: 144 mEq/L (ref 136–145)
TOTAL PROTEIN: 7.2 g/dL (ref 6.4–8.3)

## 2014-12-01 LAB — FERRITIN CHCC: FERRITIN: 144 ng/mL (ref 9–269)

## 2014-12-01 LAB — IRON AND TIBC CHCC
%SAT: 14 % — ABNORMAL LOW (ref 21–57)
IRON: 40 ug/dL — AB (ref 41–142)
TIBC: 289 ug/dL (ref 236–444)
UIBC: 249 ug/dL (ref 120–384)

## 2014-12-01 MED ORDER — HEPARIN SOD (PORK) LOCK FLUSH 100 UNIT/ML IV SOLN
500.0000 [IU] | Freq: Once | INTRAVENOUS | Status: AC | PRN
  Administered 2014-12-01: 500 [IU]
  Filled 2014-12-01: qty 5

## 2014-12-01 MED ORDER — SODIUM CHLORIDE 0.9 % IJ SOLN
10.0000 mL | INTRAMUSCULAR | Status: DC | PRN
  Administered 2014-12-01: 10 mL
  Filled 2014-12-01: qty 10

## 2014-12-01 MED ORDER — ONDANSETRON 8 MG/50ML IVPB (CHCC)
8.0000 mg | Freq: Once | INTRAVENOUS | Status: AC
  Administered 2014-12-01: 8 mg via INTRAVENOUS

## 2014-12-01 MED ORDER — DEXAMETHASONE SODIUM PHOSPHATE 10 MG/ML IJ SOLN
INTRAMUSCULAR | Status: AC
  Filled 2014-12-01: qty 1

## 2014-12-01 MED ORDER — FENTANYL 75 MCG/HR TD PT72: MEDICATED_PATCH | TRANSDERMAL | Status: DC

## 2014-12-01 MED ORDER — FENTANYL 100 MCG/HR TD PT72: MEDICATED_PATCH | TRANSDERMAL | Status: DC

## 2014-12-01 MED ORDER — ALTEPLASE 2 MG IJ SOLR
2.0000 mg | Freq: Once | INTRAMUSCULAR | Status: AC | PRN
  Administered 2014-12-01: 2 mg
  Filled 2014-12-01: qty 2

## 2014-12-01 MED ORDER — DEXAMETHASONE SODIUM PHOSPHATE 10 MG/ML IJ SOLN
10.0000 mg | Freq: Once | INTRAMUSCULAR | Status: AC
  Administered 2014-12-01: 10 mg via INTRAVENOUS

## 2014-12-01 MED ORDER — ONDANSETRON 8 MG/NS 50 ML IVPB
INTRAVENOUS | Status: AC
  Filled 2014-12-01: qty 8

## 2014-12-01 MED ORDER — DOXORUBICIN HCL LIPOSOMAL CHEMO INJECTION 2 MG/ML
39.0000 mg/m2 | Freq: Once | INTRAVENOUS | Status: AC
  Administered 2014-12-01: 60 mg via INTRAVENOUS
  Filled 2014-12-01: qty 30

## 2014-12-01 MED ORDER — SODIUM CHLORIDE 0.9 % IV SOLN
Freq: Once | INTRAVENOUS | Status: AC
  Administered 2014-12-01: 15:00:00 via INTRAVENOUS

## 2014-12-01 NOTE — Patient Instructions (Signed)
[place] Discharge Instructions for Patients Receiving Chemotherapy  Today you received the following chemotherapy agents:  Doxil  To help prevent nausea and vomiting after your treatment, we encourage you to take your nausea medication as ordered per MD.   If you develop nausea and vomiting that is not controlled by your nausea medication, call the clinic.   BELOW ARE SYMPTOMS THAT SHOULD BE REPORTED IMMEDIATELY:  *FEVER GREATER THAN 100.5 F  *CHILLS WITH OR WITHOUT FEVER  NAUSEA AND VOMITING THAT IS NOT CONTROLLED WITH YOUR NAUSEA MEDICATION  *UNUSUAL SHORTNESS OF BREATH  *UNUSUAL BRUISING OR BLEEDING  TENDERNESS IN MOUTH AND THROAT WITH OR WITHOUT PRESENCE OF ULCERS  *URINARY PROBLEMS  *BOWEL PROBLEMS  UNUSUAL RASH Items with * indicate a potential emergency and should be followed up as soon as possible.  Feel free to call the clinic you have any questions or concerns. The clinic phone number is [phone].

## 2014-12-01 NOTE — Progress Notes (Signed)
Patient here for chemo treatment today. Asked to speak with Dr. Mariana Kaufman nurse. Patient seen in lobby requesting refill on Fentanyl patches because she will not be back to [facility] until next scheduled appt on 12/17/14 and she will then be on her last Fentanyl patches she has at home then. Script for both 134mcg and 57mcg Fentanyl patches signed by Joylene John, NP and given to patient in lobby. Note placed on both prescriptions to not fill until 12/12/14.

## 2014-12-13 ENCOUNTER — Other Ambulatory Visit: Payer: Self-pay | Admitting: Oncology

## 2014-12-17 ENCOUNTER — Telehealth: Payer: Self-pay | Admitting: Oncology

## 2014-12-17 ENCOUNTER — Telehealth: Payer: Self-pay | Admitting: *Deleted

## 2014-12-17 ENCOUNTER — Other Ambulatory Visit (HOSPITAL_BASED_OUTPATIENT_CLINIC_OR_DEPARTMENT_OTHER): Payer: Medicare Other

## 2014-12-17 ENCOUNTER — Ambulatory Visit (HOSPITAL_BASED_OUTPATIENT_CLINIC_OR_DEPARTMENT_OTHER): Payer: Medicare Other | Admitting: Oncology

## 2014-12-17 ENCOUNTER — Telehealth: Payer: Self-pay

## 2014-12-17 ENCOUNTER — Encounter: Payer: Self-pay | Admitting: Oncology

## 2014-12-17 VITALS — BP 132/60 | HR 79 | Temp 98.9°F | Resp 18 | Ht 60.0 in | Wt 135.5 lb

## 2014-12-17 DIAGNOSIS — R32 Unspecified urinary incontinence: Secondary | ICD-10-CM

## 2014-12-17 DIAGNOSIS — C541 Malignant neoplasm of endometrium: Secondary | ICD-10-CM

## 2014-12-17 DIAGNOSIS — C7989 Secondary malignant neoplasm of other specified sites: Secondary | ICD-10-CM

## 2014-12-17 DIAGNOSIS — D649 Anemia, unspecified: Secondary | ICD-10-CM

## 2014-12-17 DIAGNOSIS — N200 Calculus of kidney: Secondary | ICD-10-CM

## 2014-12-17 LAB — COMPREHENSIVE METABOLIC PANEL (CC13)
ALBUMIN: 3 g/dL — AB (ref 3.5–5.0)
ALT: 11 U/L (ref 0–55)
ANION GAP: 11 meq/L (ref 3–11)
AST: 16 U/L (ref 5–34)
Alkaline Phosphatase: 67 U/L (ref 40–150)
BILIRUBIN TOTAL: 0.29 mg/dL (ref 0.20–1.20)
BUN: 22 mg/dL (ref 7.0–26.0)
CHLORIDE: 109 meq/L (ref 98–109)
CO2: 25 mEq/L (ref 22–29)
Calcium: 10.2 mg/dL (ref 8.4–10.4)
Creatinine: 1.1 mg/dL (ref 0.6–1.1)
EGFR: 49 mL/min/{1.73_m2} — ABNORMAL LOW (ref >90)
Glucose: 121 mg/dl (ref 70–140)
POTASSIUM: 3.8 meq/L (ref 3.5–5.1)
SODIUM: 144 meq/L (ref 136–145)
Total Protein: 6.8 g/dL (ref 6.4–8.3)

## 2014-12-17 LAB — CBC WITH DIFFERENTIAL/PLATELET
BASO%: 0.3 % (ref 0.0–2.0)
Basophils Absolute: 0 10*3/uL (ref 0.0–0.1)
EOS%: 5 % (ref 0.0–7.0)
Eosinophils Absolute: 0.3 10*3/uL (ref 0.0–0.5)
HEMATOCRIT: 29.3 % — AB (ref 34.8–46.6)
HEMOGLOBIN: 9 g/dL — AB (ref 11.6–15.9)
LYMPH%: 19.2 % (ref 14.0–49.7)
MCH: 28.8 pg (ref 25.1–34.0)
MCHC: 30.7 g/dL — AB (ref 31.5–36.0)
MCV: 93.9 fL (ref 79.5–101.0)
MONO#: 0.6 10*3/uL (ref 0.1–0.9)
MONO%: 9.6 % (ref 0.0–14.0)
NEUT#: 3.9 10*3/uL (ref 1.5–6.5)
NEUT%: 65.9 % (ref 38.4–76.8)
Platelets: 196 10*3/uL (ref 145–400)
RBC: 3.12 10*6/uL — ABNORMAL LOW (ref 3.70–5.45)
RDW: 17.1 % — AB (ref 11.2–14.5)
WBC: 6 10*3/uL (ref 3.9–10.3)
lymph#: 1.1 10*3/uL (ref 0.9–3.3)

## 2014-12-17 NOTE — Telephone Encounter (Signed)
Per staff message and POF I have scheduled appts. Advised scheduler of appts. JMW  

## 2014-12-17 NOTE — Telephone Encounter (Signed)
Tried to speak  with [place] representative to verified that Dr. [name] would sign orders for the ostomy supplies for Ms. Hakanson. Reference # [id number]. Waited on hold for 40 minutes with no representative answering .  Will attempt call another time.

## 2014-12-17 NOTE — Telephone Encounter (Signed)
per pof to sch pt appt-sent MW emailt os ch pt trmt-pt aware of appt °

## 2014-12-17 NOTE — Progress Notes (Signed)
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 She has tolerated the first 2 cycles of doxil well, and otherwise is feeling reasonably well now.  She had several days of constipation, with no output from colostomy, about 2 weeks ago. She was seen at [facility] office, no obstruction, and resolved that problem with several doses of miralax. She did not have nausea or vomiting with the constipation. Pain has been adequately controlled still on duragesic 126mcg every 72 hours, some difficulty keeping patches adhered; she is using 0-2 oxycodone 10 mg tablets in 24 hours, but at least 4 aleve daily + some tylenol (see below). Appetite is good, gaining weight. She adjusts activity as tolerated, but was able to join a family dinner last pm. Skin has been dry with the doxil, despite lots of lotion. She has arrangements in place to get ostomy supplies. She may want to consult with ostomy specialist in [place], given name by surgeon's office but cannot recall that now.  PAC in Flu vaccine done  ONCOLOGIC HISTORY Patient had robotic assisted total laparoscopic hysterectomy bilateral salpingo-oophorectomy right pelvic lymph node biopsy on 12/19/2011. 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 There was no involvement of other organs lymphovascular invasion was not identified. 3 lymph nodes were negative for metastatic disease. 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  Patient was seen by Dr. [name] on 04/29/2013 with a  nodule in the vagina concerning for recurrence as well as 8 cm rectal mass. CT of the abdomen and pelvis showed a lobular mass at the vagina cuff measuring 7.0 x 3.2 cm proximally 4 cm craniocaudal dimension, with compression of the distal aspect of the sigmoid colon just above the rectum . There was also noted to be a large necrotic lymph node measuring 2.8 cm within the sigmoid mesocolon centrally. There are also small bilateral external iliac lymph nodes. There were no aggressive osseous lesions. 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  Patient received 6 cycles of taxol carboplatin from 06/03/2013 thru 18/56//3149, complicated by some peripheral neuropathy. There was an interval decrease in tumor burden therefore Dr. [name] recommended 3 additional cycles of Taxotere/Carboplatin, received from 11/27/13 through 01/15/14.  CT of the chest, abdomen, and pelvis on 02/10/14 demonstrated disease progression. She had radiation by Dr Sondra Come, [place] given from 02/23/14 thru 03-27-14. PET was obtained in September 2015. That demonstrates highly metabolic activity in the central pelvis involving the sigmoid colon, with rectovaginal mass confirmed on exam by gyn oncology. Patient declined total pelvic exenteration. Colonoscopy 09-08-14 confirmed near complete rectal obstruction from external mass. She had diverting colostomy by Dr [name] 09-24-14, no disease visualized outside of pelvis. She began Doxil on 11-03-14.  Review of systems as above, also: No fever or symptoms of infection. No SOB or cough. No new or different pain. No LE swelling. Bladder ok.  Remainder of 10 point Review of Systems negative.  Objective:  Vital signs in last 24 hours:  BP 132/60 mmHg  Pulse 79  Temp(Src) 98.9 F (37.2 C) (Oral)  Resp 18  Ht  5' (1.524 m)  Wt 135 lb 8 oz (61.462 kg)  BMI 26.46 kg/m2 Weight is up 9.5 lbs from 11-16-14. Alert, oriented and appropriate.  Ambulatory without difficulty. Respirations not labored RA. Looks comfortable No alopecia  HEENT:PERRL, sclerae not icteric. Oral mucosa moist without lesions, posterior pharynx clear.  Neck supple. No JVD.  Lymphatics:no cervical,supraclavicular, axillary or inguinal adenopathy Resp: clear to auscultation bilaterally and normal percussion bilaterally Cardio: regular rate and rhythm. No gallop. GI: soft, nontender, not distended, no mass or organomegaly. Normally active bowel sounds. Soft brown stool in ostomy bag Musculoskeletal/ Extremities: without pitting edema, cords, tenderness Neuro: no peripheral neuropathy. Otherwise nonfocal. PSYCH appropriate mood and affect Skin without rash, ecchymosis, petechiae. Rather dry thruout. No palmar/ plantar findings.No irritation around ostomy appliance. Duragesic patches secured with paper tape on back. Portacath-without erythema or tenderness  Lab Results:  Results for orders placed or performed in visit on 12/17/14  CBC with Differential  Result Value Ref Range   WBC 6.0 3.9 - 10.3 10e3/uL   NEUT# 3.9 1.5 - 6.5 10e3/uL   HGB 9.0 (L) 11.6 - 15.9 g/dL   HCT 29.3 (L) 34.8 - 46.6 %   Platelets 196 145 - 400 10e3/uL   MCV 93.9 79.5 - 101.0 fL   MCH 28.8 25.1 - 34.0 pg   MCHC 30.7 (L) 31.5 - 36.0 g/dL   RBC 3.12 (L) 3.70 - 5.45 10e6/uL   RDW 17.1 (H) 11.2 - 14.5 %   lymph# 1.1 0.9 - 3.3 10e3/uL   MONO# 0.6 0.1 - 0.9 10e3/uL   Eosinophils Absolute 0.3 0.0 - 0.5 10e3/uL   Basophils Absolute 0.0 0.0 - 0.1 10e3/uL   NEUT% 65.9 38.4 - 76.8 %   LYMPH% 19.2 14.0 - 49.7 %   MONO% 9.6 0.0 - 14.0 %   EOS% 5.0 0.0 - 7.0 %   BASO% 0.3 0.0 - 2.0 %  Comprehensive metabolic panel (Cmet) - [facility]  Result Value Ref Range   Sodium 144 136 - 145 mEq/L   Potassium 3.8 3.5 - 5.1 mEq/L   Chloride 109 98 - 109 mEq/L   CO2 25 22 - 29 mEq/L   Glucose 121 70 - 140 mg/dl   BUN 22.0 7.0 - 26.0 mg/dL   Creatinine 1.1 0.6 - 1.1 mg/dL   Total Bilirubin 0.29 0.20 -  1.20 mg/dL   Alkaline Phosphatase 67 40 - 150 U/L   AST 16 5 - 34 U/L   ALT 11 0 - 55 U/L   Total Protein 6.8 6.4 - 8.3 g/dL   Albumin 3.0 (L) 3.5 - 5.0 g/dL   Calcium 10.2 8.4 - 10.4 mg/dL   Anion Gap 11 3 - 11 mEq/L   EGFR 49 (L) >90 ml/min/1.73 m2    Urine culture negative early Jan.  Studies/Results:  No results found. Last imaging was PET 07-22-2014  Medications: I have reviewed the patient's current medications. SHe should not use >2 aleve in 24 hours,each dose with food. Use miralax daily or bid if needed to keep bowels moving daily. Still on Megace bid, which she may want to decrease to just once daily as weight is up over 9 lbs now (RN to contact her about this).  Continue iron daily  DISCUSSION: clinically she is doing much better overall and wants to continue doxil. She is aware that skin reaction from doxil is more likely with cumulative exposure, skin care reviewed. As long as no other concerns, we may wait until after cycle 4  doxil to repeat imaging.  Assessment/Plan:  1.endometrial carcinoma IA, grade 1 with no high risk features at surgery 12-2011, then recurrent to vagina 04-2013 and progressive in pelvis despite chemo and RT. 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 Doxil begun 11-03-14, for cycle 3 on 12-29-14. I will see her back 2-3 weeks after that treatment. Continue pain medication as noted. 2.PAC in 3.multifactoria anemia: Hgb lower today, but not clearly symptomatic and no overt bleeding. Continue oral iron and follow. Serum iron 40 and %sat 14 on 12-01-14. 4.nephrolithiasis, left breast biopsies  5.flu vaccine done 6.advance directives in place 7.good EF by echocardiogram 07-23-14 8.urinary pressure and some incontinence: UA C&S pending, tho may be from metastatic disease to pelvis. 9.appetite much better with Megace, consider decreasing dose as above 10.constipation resolved, good output from ostomy now  Chemo  orders signed. Patient is in agreement with recommendations and plans, and knows that she can call if needed prior to next scheduled appointment. Time spent 25 min including >50% counseling and coordination of care.   Avnoor Koury P, MD   12/17/2014, 1:04 PM

## 2014-12-18 ENCOUNTER — Telehealth: Payer: Self-pay | Admitting: *Deleted

## 2014-12-18 ENCOUNTER — Telehealth: Payer: Self-pay

## 2014-12-18 NOTE — Telephone Encounter (Signed)
-----   Message from Gordy Levan, MD sent at 12/18/2014  3:14 PM EST ----- Please let her know OK to cut back on Megace to once daily if she wants to try that. (It is working well for appetite, weight up >9 lbs with present bid dosing.)

## 2014-12-18 NOTE — Telephone Encounter (Signed)
PT. IS AN ACTIVE WITH DR.LIVESAY.

## 2014-12-18 NOTE — Telephone Encounter (Signed)
S/w pt that Dr [name] said it is OK to cut back Megace to once daily. She is gaining weight. Pt said thank you.

## 2014-12-29 ENCOUNTER — Ambulatory Visit (HOSPITAL_BASED_OUTPATIENT_CLINIC_OR_DEPARTMENT_OTHER): Payer: Medicare Other

## 2014-12-29 ENCOUNTER — Other Ambulatory Visit (HOSPITAL_BASED_OUTPATIENT_CLINIC_OR_DEPARTMENT_OTHER): Payer: Medicare Other

## 2014-12-29 DIAGNOSIS — C7982 Secondary malignant neoplasm of genital organs: Secondary | ICD-10-CM

## 2014-12-29 DIAGNOSIS — Z5111 Encounter for antineoplastic chemotherapy: Secondary | ICD-10-CM

## 2014-12-29 DIAGNOSIS — C7989 Secondary malignant neoplasm of other specified sites: Secondary | ICD-10-CM

## 2014-12-29 DIAGNOSIS — C541 Malignant neoplasm of endometrium: Secondary | ICD-10-CM

## 2014-12-29 LAB — CBC WITH DIFFERENTIAL/PLATELET
BASO%: 0.6 % (ref 0.0–2.0)
BASOS ABS: 0.1 10*3/uL (ref 0.0–0.1)
EOS%: 2.1 % (ref 0.0–7.0)
Eosinophils Absolute: 0.2 10*3/uL (ref 0.0–0.5)
HCT: 29.6 % — ABNORMAL LOW (ref 34.8–46.6)
HEMOGLOBIN: 9.2 g/dL — AB (ref 11.6–15.9)
LYMPH#: 1.8 10*3/uL (ref 0.9–3.3)
LYMPH%: 18.1 % (ref 14.0–49.7)
MCH: 27.9 pg (ref 25.1–34.0)
MCHC: 31.2 g/dL — ABNORMAL LOW (ref 31.5–36.0)
MCV: 89.4 fL (ref 79.5–101.0)
MONO#: 1.2 10*3/uL — ABNORMAL HIGH (ref 0.1–0.9)
MONO%: 12 % (ref 0.0–14.0)
NEUT#: 6.6 10*3/uL — ABNORMAL HIGH (ref 1.5–6.5)
NEUT%: 67.2 % (ref 38.4–76.8)
Platelets: 304 10*3/uL (ref 145–400)
RBC: 3.31 10*6/uL — ABNORMAL LOW (ref 3.70–5.45)
RDW: 17.8 % — AB (ref 11.2–14.5)
WBC: 9.8 10*3/uL (ref 3.9–10.3)

## 2014-12-29 LAB — COMPREHENSIVE METABOLIC PANEL (CC13)
ALT: 9 U/L (ref 0–55)
AST: 15 U/L (ref 5–34)
Albumin: 3.2 g/dL — ABNORMAL LOW (ref 3.5–5.0)
Alkaline Phosphatase: 70 U/L (ref 40–150)
Anion Gap: 10 mEq/L (ref 3–11)
BUN: 14.9 mg/dL (ref 7.0–26.0)
CALCIUM: 10.5 mg/dL — AB (ref 8.4–10.4)
CHLORIDE: 107 meq/L (ref 98–109)
CO2: 26 mEq/L (ref 22–29)
CREATININE: 1.1 mg/dL (ref 0.6–1.1)
EGFR: 53 mL/min/{1.73_m2} — AB (ref >90)
Glucose: 145 mg/dl — ABNORMAL HIGH (ref 70–140)
POTASSIUM: 4 meq/L (ref 3.5–5.1)
SODIUM: 142 meq/L (ref 136–145)
TOTAL PROTEIN: 6.8 g/dL (ref 6.4–8.3)
Total Bilirubin: 0.32 mg/dL (ref 0.20–1.20)

## 2014-12-29 MED ORDER — SODIUM CHLORIDE 0.9 % IV SOLN
Freq: Once | INTRAVENOUS | Status: AC
  Administered 2014-12-29: 11:00:00 via INTRAVENOUS

## 2014-12-29 MED ORDER — HEPARIN SOD (PORK) LOCK FLUSH 100 UNIT/ML IV SOLN
500.0000 [IU] | Freq: Once | INTRAVENOUS | Status: AC | PRN
  Administered 2014-12-29: 500 [IU]
  Filled 2014-12-29: qty 5

## 2014-12-29 MED ORDER — DEXAMETHASONE SODIUM PHOSPHATE 10 MG/ML IJ SOLN
INTRAMUSCULAR | Status: AC
  Filled 2014-12-29: qty 1

## 2014-12-29 MED ORDER — ONDANSETRON 8 MG/NS 50 ML IVPB
INTRAVENOUS | Status: AC
  Filled 2014-12-29: qty 8

## 2014-12-29 MED ORDER — SODIUM CHLORIDE 0.9 % IJ SOLN
10.0000 mL | INTRAMUSCULAR | Status: DC | PRN
  Administered 2014-12-29: 10 mL
  Filled 2014-12-29: qty 10

## 2014-12-29 MED ORDER — DEXAMETHASONE SODIUM PHOSPHATE 10 MG/ML IJ SOLN
10.0000 mg | Freq: Once | INTRAMUSCULAR | Status: AC
  Administered 2014-12-29: 10 mg via INTRAVENOUS

## 2014-12-29 MED ORDER — DOXORUBICIN HCL LIPOSOMAL CHEMO INJECTION 2 MG/ML
39.0000 mg/m2 | Freq: Once | INTRAVENOUS | Status: AC
  Administered 2014-12-29: 60 mg via INTRAVENOUS
  Filled 2014-12-29: qty 30

## 2014-12-29 MED ORDER — ONDANSETRON 8 MG/50ML IVPB (CHCC)
8.0000 mg | Freq: Once | INTRAVENOUS | Status: AC
  Administered 2014-12-29: 8 mg via INTRAVENOUS

## 2014-12-29 NOTE — Patient Instructions (Signed)
[facility] Discharge Instructions for Patients Receiving Chemotherapy  Today you received the following chemotherapy agents doxil  To help prevent nausea and vomiting after your treatment, we encourage you to take your nausea medication as directed. If you develop nausea and vomiting that is not controlled by your nausea medication, call the clinic.   BELOW ARE SYMPTOMS THAT SHOULD BE REPORTED IMMEDIATELY:  *FEVER GREATER THAN 100.5 F  *CHILLS WITH OR WITHOUT FEVER  NAUSEA AND VOMITING THAT IS NOT CONTROLLED WITH YOUR NAUSEA MEDICATION  *UNUSUAL SHORTNESS OF BREATH  *UNUSUAL BRUISING OR BLEEDING  TENDERNESS IN MOUTH AND THROAT WITH OR WITHOUT PRESENCE OF ULCERS  *URINARY PROBLEMS  *BOWEL PROBLEMS  UNUSUAL RASH Items with * indicate a potential emergency and should be followed up as soon as possible.  Feel free to call the clinic you have any questions or concerns. The clinic phone number is [phone].  

## 2015-01-10 ENCOUNTER — Other Ambulatory Visit: Payer: Self-pay | Admitting: Oncology

## 2015-01-10 DIAGNOSIS — C7989 Secondary malignant neoplasm of other specified sites: Secondary | ICD-10-CM

## 2015-01-10 DIAGNOSIS — C541 Malignant neoplasm of endometrium: Secondary | ICD-10-CM

## 2015-01-14 ENCOUNTER — Other Ambulatory Visit (HOSPITAL_BASED_OUTPATIENT_CLINIC_OR_DEPARTMENT_OTHER): Payer: Medicare Other

## 2015-01-14 ENCOUNTER — Telehealth: Payer: Self-pay | Admitting: Oncology

## 2015-01-14 ENCOUNTER — Encounter: Payer: Self-pay | Admitting: Oncology

## 2015-01-14 ENCOUNTER — Ambulatory Visit (HOSPITAL_BASED_OUTPATIENT_CLINIC_OR_DEPARTMENT_OTHER): Payer: Medicare Other | Admitting: Oncology

## 2015-01-14 VITALS — BP 129/66 | HR 76 | Temp 98.5°F | Resp 18 | Ht 60.0 in | Wt 130.0 lb

## 2015-01-14 DIAGNOSIS — C541 Malignant neoplasm of endometrium: Secondary | ICD-10-CM

## 2015-01-14 DIAGNOSIS — G893 Neoplasm related pain (acute) (chronic): Secondary | ICD-10-CM

## 2015-01-14 DIAGNOSIS — D649 Anemia, unspecified: Secondary | ICD-10-CM | POA: Diagnosis not present

## 2015-01-14 DIAGNOSIS — C7989 Secondary malignant neoplasm of other specified sites: Secondary | ICD-10-CM

## 2015-01-14 LAB — COMPREHENSIVE METABOLIC PANEL (CC13)
ALT: 8 U/L (ref 0–55)
ANION GAP: 11 meq/L (ref 3–11)
AST: 17 U/L (ref 5–34)
Albumin: 3.2 g/dL — ABNORMAL LOW (ref 3.5–5.0)
Alkaline Phosphatase: 75 U/L (ref 40–150)
BILIRUBIN TOTAL: 0.26 mg/dL (ref 0.20–1.20)
BUN: 13.7 mg/dL (ref 7.0–26.0)
CO2: 25 meq/L (ref 22–29)
Calcium: 10.4 mg/dL (ref 8.4–10.4)
Chloride: 109 mEq/L (ref 98–109)
Creatinine: 1.1 mg/dL (ref 0.6–1.1)
EGFR: 54 mL/min/{1.73_m2} — ABNORMAL LOW (ref >90)
GLUCOSE: 155 mg/dL — AB (ref 70–140)
Potassium: 3.9 mEq/L (ref 3.5–5.1)
SODIUM: 145 meq/L (ref 136–145)
TOTAL PROTEIN: 7 g/dL (ref 6.4–8.3)

## 2015-01-14 LAB — CBC WITH DIFFERENTIAL/PLATELET
BASO%: 0.3 % (ref 0.0–2.0)
BASOS ABS: 0 10*3/uL (ref 0.0–0.1)
EOS ABS: 0.3 10*3/uL (ref 0.0–0.5)
EOS%: 4.8 % (ref 0.0–7.0)
HCT: 30.8 % — ABNORMAL LOW (ref 34.8–46.6)
HEMOGLOBIN: 9.5 g/dL — AB (ref 11.6–15.9)
LYMPH%: 22.2 % (ref 14.0–49.7)
MCH: 29.1 pg (ref 25.1–34.0)
MCHC: 30.8 g/dL — ABNORMAL LOW (ref 31.5–36.0)
MCV: 94.5 fL (ref 79.5–101.0)
MONO#: 0.5 10*3/uL (ref 0.1–0.9)
MONO%: 8.5 % (ref 0.0–14.0)
NEUT%: 64.2 % (ref 38.4–76.8)
NEUTROS ABS: 3.8 10*3/uL (ref 1.5–6.5)
PLATELETS: 250 10*3/uL (ref 145–400)
RBC: 3.26 10*6/uL — ABNORMAL LOW (ref 3.70–5.45)
RDW: 17.5 % — AB (ref 11.2–14.5)
WBC: 6 10*3/uL (ref 3.9–10.3)
lymph#: 1.3 10*3/uL (ref 0.9–3.3)

## 2015-01-14 MED ORDER — FENTANYL 75 MCG/HR TD PT72: MEDICATED_PATCH | TRANSDERMAL | Status: DC

## 2015-01-14 MED ORDER — FENTANYL 100 MCG/HR TD PT72: MEDICATED_PATCH | TRANSDERMAL | Status: DC

## 2015-01-14 NOTE — Telephone Encounter (Signed)
appts made,avs printed for pt and contrast given

## 2015-01-14 NOTE — Progress Notes (Signed)
OFFICE PROGRESS NOTE   January 14, 2015   Physicians:David Dwyane Dee (PCP Jonita Albee), W.Brewster, J.Kinard, S.[name], Yancey Flemings  INTERVAL HISTORY:   Patient is seen, together with daughter, in continuing attention to recurrent endometrial carcinoma involving pelvis, now receiving doxil. She has tolerated the doxil with no significant problems, cycle 3 given 12-29-14. We plan repeat scans after cycle 4.  Patient had no skin irritation after most recent doxil, being very careful with all skin precautions. She has had no nausea and no increased fatigue. Pain is still in rectal area, however she feels this is very adequately controlled at present with duragesic at 175 mcg/ 72 hrs plus aleve 1-2x daily and oxycodone 4-6 tablets in 24 hrs. Ostomy is functioning well and she still has small amounts of material from rectum, very painful when evacuating rectum. Appetite is good now on Megace just once daily.  PAC in Flu vaccine done   ONCOLOGIC HISTORY  atient had robotic assisted total laparoscopic hysterectomy bilateral salpingo-oophorectomy right pelvic lymph node biopsy on 12/19/2011. 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 There was no involvement of other organs lymphovascular invasion was not identified. 3 lymph nodes were negative for metastatic disease. 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  Patient was seen by Dr. Nelly Rout on 04/29/2013 with a nodule in the vagina concerning for recurrence as well as 8 cm rectal mass. CT of the abdomen and pelvis showed a lobular mass at the vagina cuff measuring 7.0 x 3.2 cm proximally 4 cm craniocaudal dimension, with compression of the distal aspect of the sigmoid colon just above the rectum . There was  also noted to be a large necrotic lymph node measuring 2.8 cm within the sigmoid mesocolon centrally. There are also small bilateral external iliac lymph nodes. There were no aggressive osseous lesions. 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  Patient received 6 cycles of taxol carboplatin from 06/03/2013 thru 11/11//2014, complicated by some peripheral neuropathy. There was an interval decrease in tumor burden therefore Dr. Nelly Rout recommended 3 additional cycles of Taxotere/Carboplatin, received from 11/27/13 through 01/15/14.  CT of the chest, abdomen, and pelvis on 02/10/14 demonstrated disease progression. She had radiation by Dr Roselind Messier, [address] given from 02/23/14 thru 03-27-14. PET was obtained in September 2015. That demonstrates highly metabolic activity in the central pelvis involving the sigmoid colon, with rectovaginal mass confirmed on exam by gyn oncology. Patient declined total pelvic exenteration. Colonoscopy 09-08-14 confirmed near complete rectal obstruction from external mass. She had diverting colostomy by Dr Ezzard Standing 09-24-14, no disease visualized outside of pelvis. She began Doxil on 11-03-14.  Review of systems as above, also: The 100 mcg fentanyl patches do not adhere adequately. Nails rough and skin somewhat dry. Still drainage from lower fisutula tract in abdomen, usually ~ 2x daily.  No SOB or chest pain. No problems with PAC. No fever or symptoms of infection. Has done some yard work on the warm days. Present weight 130 lbs is about usual good weight. Remainder of 10 point Review of Systems negative.  Objective:  Vital signs in last 24 hours:  BP 129/66 mmHg  Pulse 76  Temp(Src) 98.5 F (36.9 C) (Oral)  Resp 18  Ht 5' (1.524 m)  Wt 130 lb (58.968 kg)  BMI 25.39 kg/m2 weight down 5.5 lbs since decrease in Megace to once daily.  Alert, oriented and appropriate. Ambulatory without difficulty. Respirations not  labored RA. No alopecia  HEENT:PERRL, sclerae not icteric. Oral mucosa moist without lesions, posterior pharynx clear.  Neck supple. No JVD.  Lymphatics:no cervical,supraclavicular, axillary or inguinal adenopathy Resp: clear to auscultation bilaterally and normal percussion bilaterally Cardio: regular rate and rhythm. No gallop. GI: soft, nontender, not distended, no mass or organomegaly. Normally active bowel sounds. Greenish loose stool in ostomy bag, dry dressing to fistula tract inferior to ostomy. Musculoskeletal/ Extremities: without pitting edema, cords, tenderness Neuro: no peripheral neuropathy. Otherwise nonfocal Skin without rash, ecchymosis, petechiae. Not markedly dry. No irritation around ostomy appliance. Paper tape. Portacath-without erythema or tenderness  Lab Results:  Results for orders placed or performed in visit on 01/14/15  CBC with Differential  Result Value Ref Range   WBC 6.0 3.9 - 10.3 10e3/uL   NEUT# 3.8 1.5 - 6.5 10e3/uL   HGB 9.5 (L) 11.6 - 15.9 g/dL   HCT 30.8 (L) 34.8 - 46.6 %   Platelets 250 145 - 400 10e3/uL   MCV 94.5 79.5 - 101.0 fL   MCH 29.1 25.1 - 34.0 pg   MCHC 30.8 (L) 31.5 - 36.0 g/dL   RBC 3.26 (L) 3.70 - 5.45 10e6/uL   RDW 17.5 (H) 11.2 - 14.5 %   lymph# 1.3 0.9 - 3.3 10e3/uL   MONO# 0.5 0.1 - 0.9 10e3/uL   Eosinophils Absolute 0.3 0.0 - 0.5 10e3/uL   Basophils Absolute 0.0 0.0 - 0.1 10e3/uL   NEUT% 64.2 38.4 - 76.8 %   LYMPH% 22.2 14.0 - 49.7 %   MONO% 8.5 0.0 - 14.0 %   EOS% 4.8 0.0 - 7.0 %   BASO% 0.3 0.0 - 2.0 %  Comprehensive metabolic panel (Cmet) - [facility]  Result Value Ref Range   Sodium 145 136 - 145 mEq/L   Potassium 3.9 3.5 - 5.1 mEq/L   Chloride 109 98 - 109 mEq/L   CO2 25 22 - 29 mEq/L   Glucose 155 (H) 70 - 140 mg/dl   BUN 13.7 7.0 - 26.0 mg/dL   Creatinine 1.1 0.6 - 1.1 mg/dL   Total Bilirubin 0.26 0.20 - 1.20 mg/dL   Alkaline Phosphatase 75 40 - 150 U/L   AST 17 5 - 34 U/L   ALT 8 0 - 55 U/L   Total Protein  7.0 6.4 - 8.3 g/dL   Albumin 3.2 (L) 3.5 - 5.0 g/dL   Calcium 10.4 8.4 - 10.4 mg/dL   Anion Gap 11 3 - 11 mEq/L   EGFR 54 (L) >90 ml/min/1.73 m2     Studies/Results:  No results found.  Medications: I have reviewed the patient's current medications. Type on fentanyl 100 and 75 patches is different, both generic, the 100 not adhering. I have asked patient to let pharmacist know that she needs to change to different manufacturer for the 100 patch. Prescriptions given. She does not want to increase dose of the fentanyl now.  DISCUSSION: medications as above. She wants to continue doxil on schedule for cycle 4 prior to scans.  Assessment/Plan:  1.endometrial carcinoma IA, grade 1 with no high risk features at surgery 12-2011, then recurrent to vagina 04-2013 and progressive in pelvis despite chemo and RT. 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 Doxil begun 11-03-14, for cycle 4  on 01-26-15. Will repeat CT shortly prior to my  next visit on 02-18-15.  Continue pain medication as noted. 2.PAC in 3.multifactoria anemia: Hgb a little better today,  not more symptomatic and no overt bleeding. Continue oral iron and follow.  4.nephrolithiasis, left breast biopsies  5.flu vaccine done 6.advance directives in place 7.good EF by echocardiogram 07-23-14 8.problem with adhesive for 100 mcg fentanyl patch, will ask her pharmacist to address. 9.appetite still good with megace once daily 10.constipation resolved, good output from ostomy now. Patient will begin ordering her own supplies.   Chemo orders placed. All questions answered and she knows to call prior to next scheduled visit if needed. TIme spent 25 min including >50% counseling and coordination of care   LIVESAY,LENNIS P, MD   01/14/2015, 9:55 AM

## 2015-01-15 ENCOUNTER — Other Ambulatory Visit: Payer: Self-pay | Admitting: Oncology

## 2015-01-15 DIAGNOSIS — C7989 Secondary malignant neoplasm of other specified sites: Secondary | ICD-10-CM

## 2015-01-15 DIAGNOSIS — C541 Malignant neoplasm of endometrium: Secondary | ICD-10-CM

## 2015-01-18 NOTE — Progress Notes (Signed)
This encounter was created in error - please disregard.

## 2015-01-26 ENCOUNTER — Ambulatory Visit (HOSPITAL_BASED_OUTPATIENT_CLINIC_OR_DEPARTMENT_OTHER): Payer: Medicare Other

## 2015-01-26 ENCOUNTER — Other Ambulatory Visit (HOSPITAL_BASED_OUTPATIENT_CLINIC_OR_DEPARTMENT_OTHER): Payer: Medicare Other

## 2015-01-26 DIAGNOSIS — D649 Anemia, unspecified: Secondary | ICD-10-CM | POA: Diagnosis not present

## 2015-01-26 DIAGNOSIS — Z5111 Encounter for antineoplastic chemotherapy: Secondary | ICD-10-CM

## 2015-01-26 DIAGNOSIS — C541 Malignant neoplasm of endometrium: Secondary | ICD-10-CM

## 2015-01-26 DIAGNOSIS — C7989 Secondary malignant neoplasm of other specified sites: Secondary | ICD-10-CM

## 2015-01-26 LAB — CBC WITH DIFFERENTIAL/PLATELET
BASO%: 0.3 % (ref 0.0–2.0)
BASOS ABS: 0 10*3/uL (ref 0.0–0.1)
EOS%: 2 % (ref 0.0–7.0)
Eosinophils Absolute: 0.2 10*3/uL (ref 0.0–0.5)
HCT: 30.5 % — ABNORMAL LOW (ref 34.8–46.6)
HEMOGLOBIN: 9.3 g/dL — AB (ref 11.6–15.9)
LYMPH#: 1.8 10*3/uL (ref 0.9–3.3)
LYMPH%: 22.4 % (ref 14.0–49.7)
MCH: 28.8 pg (ref 25.1–34.0)
MCHC: 30.5 g/dL — ABNORMAL LOW (ref 31.5–36.0)
MCV: 94.4 fL (ref 79.5–101.0)
MONO#: 0.9 10*3/uL (ref 0.1–0.9)
MONO%: 10.9 % (ref 0.0–14.0)
NEUT#: 5.2 10*3/uL (ref 1.5–6.5)
NEUT%: 64.4 % (ref 38.4–76.8)
Platelets: 241 10*3/uL (ref 145–400)
RBC: 3.23 10*6/uL — AB (ref 3.70–5.45)
RDW: 17.5 % — AB (ref 11.2–14.5)
WBC: 8 10*3/uL (ref 3.9–10.3)

## 2015-01-26 LAB — COMPREHENSIVE METABOLIC PANEL (CC13)
ALK PHOS: 71 U/L (ref 40–150)
ALT: 11 U/L (ref 0–55)
AST: 19 U/L (ref 5–34)
Albumin: 3.4 g/dL — ABNORMAL LOW (ref 3.5–5.0)
Anion Gap: 13 mEq/L — ABNORMAL HIGH (ref 3–11)
BUN: 12.9 mg/dL (ref 7.0–26.0)
CO2: 25 mEq/L (ref 22–29)
CREATININE: 0.9 mg/dL (ref 0.6–1.1)
Calcium: 10.1 mg/dL (ref 8.4–10.4)
Chloride: 106 mEq/L (ref 98–109)
EGFR: 63 mL/min/{1.73_m2} — ABNORMAL LOW (ref >90)
Glucose: 99 mg/dl (ref 70–140)
Potassium: 3.4 mEq/L — ABNORMAL LOW (ref 3.5–5.1)
Sodium: 144 mEq/L (ref 136–145)
Total Bilirubin: 0.32 mg/dL (ref 0.20–1.20)
Total Protein: 6.9 g/dL (ref 6.4–8.3)

## 2015-01-26 MED ORDER — SODIUM CHLORIDE 0.9 % IV SOLN
Freq: Once | INTRAVENOUS | Status: AC
  Administered 2015-01-26: 11:00:00 via INTRAVENOUS

## 2015-01-26 MED ORDER — HEPARIN SOD (PORK) LOCK FLUSH 100 UNIT/ML IV SOLN
500.0000 [IU] | Freq: Once | INTRAVENOUS | Status: AC | PRN
  Administered 2015-01-26: 500 [IU]
  Filled 2015-01-26: qty 5

## 2015-01-26 MED ORDER — DOXORUBICIN HCL LIPOSOMAL CHEMO INJECTION 2 MG/ML
39.0000 mg/m2 | Freq: Once | INTRAVENOUS | Status: AC
  Administered 2015-01-26: 60 mg via INTRAVENOUS
  Filled 2015-01-26: qty 30

## 2015-01-26 MED ORDER — SODIUM CHLORIDE 0.9 % IV SOLN
Freq: Once | INTRAVENOUS | Status: AC
  Administered 2015-01-26: 12:00:00 via INTRAVENOUS
  Filled 2015-01-26: qty 4

## 2015-01-26 MED ORDER — SODIUM CHLORIDE 0.9 % IJ SOLN
10.0000 mL | INTRAMUSCULAR | Status: DC | PRN
  Administered 2015-01-26: 10 mL
  Filled 2015-01-26: qty 10

## 2015-01-26 NOTE — Patient Instructions (Signed)
[facility] Discharge Instructions for Patients Receiving Chemotherapy  Today you received the following chemotherapy agents :  Doxil.  To help prevent nausea and vomiting after your treatment, we encourage you to take your nausea medication as prescribed.   If you develop nausea and vomiting that is not controlled by your nausea medication, call the clinic.   BELOW ARE SYMPTOMS THAT SHOULD BE REPORTED IMMEDIATELY:  *FEVER GREATER THAN 100.5 F  *CHILLS WITH OR WITHOUT FEVER  NAUSEA AND VOMITING THAT IS NOT CONTROLLED WITH YOUR NAUSEA MEDICATION  *UNUSUAL SHORTNESS OF BREATH  *UNUSUAL BRUISING OR BLEEDING  TENDERNESS IN MOUTH AND THROAT WITH OR WITHOUT PRESENCE OF ULCERS  *URINARY PROBLEMS  *BOWEL PROBLEMS  UNUSUAL RASH Items with * indicate a potential emergency and should be followed up as soon as possible.  Feel free to call the clinic you have any questions or concerns. The clinic phone number is [phone].

## 2015-02-13 ENCOUNTER — Other Ambulatory Visit: Payer: Self-pay | Admitting: Oncology

## 2015-02-15 ENCOUNTER — Ambulatory Visit (HOSPITAL_COMMUNITY)
Admission: RE | Admit: 2015-02-15 | Discharge: 2015-02-15 | Disposition: A | Discharge status: Home / Self Care | Payer: Medicare Other | Source: Ambulatory Visit | Attending: Oncology | Admitting: Oncology

## 2015-02-15 ENCOUNTER — Encounter (HOSPITAL_COMMUNITY): Payer: Self-pay

## 2015-02-15 DIAGNOSIS — C541 Malignant neoplasm of endometrium: Secondary | ICD-10-CM

## 2015-02-15 DIAGNOSIS — C7989 Secondary malignant neoplasm of other specified sites: Secondary | ICD-10-CM | POA: Insufficient documentation

## 2015-02-15 MED ORDER — IOHEXOL 300 MG/ML  SOLN
100.0000 mL | Freq: Once | INTRAMUSCULAR | Status: AC | PRN
  Administered 2015-02-15: 100 mL via INTRAVENOUS

## 2015-02-16 ENCOUNTER — Encounter: Payer: Self-pay | Admitting: Oncology

## 2015-02-16 NOTE — Progress Notes (Signed)
Medical Oncology  Message to Dr Sondra Come re most recent CT, ? Further radiation to area of iliacus muscle  L.[name], Md

## 2015-02-18 ENCOUNTER — Encounter: Payer: Self-pay | Admitting: Oncology

## 2015-02-18 ENCOUNTER — Telehealth: Payer: Self-pay | Admitting: *Deleted

## 2015-02-18 ENCOUNTER — Ambulatory Visit (HOSPITAL_BASED_OUTPATIENT_CLINIC_OR_DEPARTMENT_OTHER): Payer: Medicare Other | Admitting: Oncology

## 2015-02-18 ENCOUNTER — Other Ambulatory Visit (HOSPITAL_BASED_OUTPATIENT_CLINIC_OR_DEPARTMENT_OTHER): Payer: Medicare Other

## 2015-02-18 ENCOUNTER — Telehealth: Payer: Self-pay | Admitting: Oncology

## 2015-02-18 VITALS — BP 141/73 | HR 70 | Temp 97.4°F | Resp 18 | Ht 60.0 in | Wt 125.8 lb

## 2015-02-18 DIAGNOSIS — D649 Anemia, unspecified: Secondary | ICD-10-CM

## 2015-02-18 DIAGNOSIS — C541 Malignant neoplasm of endometrium: Secondary | ICD-10-CM

## 2015-02-18 DIAGNOSIS — C7989 Secondary malignant neoplasm of other specified sites: Secondary | ICD-10-CM | POA: Diagnosis not present

## 2015-02-18 DIAGNOSIS — G893 Neoplasm related pain (acute) (chronic): Secondary | ICD-10-CM

## 2015-02-18 DIAGNOSIS — T451X5A Adverse effect of antineoplastic and immunosuppressive drugs, initial encounter: Secondary | ICD-10-CM

## 2015-02-18 DIAGNOSIS — D6481 Anemia due to antineoplastic chemotherapy: Secondary | ICD-10-CM

## 2015-02-18 LAB — CBC WITH DIFFERENTIAL/PLATELET
BASO%: 0.1 % (ref 0.0–2.0)
BASOS ABS: 0 10*3/uL (ref 0.0–0.1)
EOS%: 2.7 % (ref 0.0–7.0)
Eosinophils Absolute: 0.2 10*3/uL (ref 0.0–0.5)
HEMATOCRIT: 30.5 % — AB (ref 34.8–46.6)
HGB: 9.5 g/dL — ABNORMAL LOW (ref 11.6–15.9)
LYMPH#: 1.7 10*3/uL (ref 0.9–3.3)
LYMPH%: 22.6 % (ref 14.0–49.7)
MCH: 29.4 pg (ref 25.1–34.0)
MCHC: 31.1 g/dL — ABNORMAL LOW (ref 31.5–36.0)
MCV: 94.4 fL (ref 79.5–101.0)
MONO#: 1 10*3/uL — AB (ref 0.1–0.9)
MONO%: 13.4 % (ref 0.0–14.0)
NEUT%: 61.2 % (ref 38.4–76.8)
NEUTROS ABS: 4.5 10*3/uL (ref 1.5–6.5)
Platelets: 227 10*3/uL (ref 145–400)
RBC: 3.23 10*6/uL — ABNORMAL LOW (ref 3.70–5.45)
RDW: 16.5 % — ABNORMAL HIGH (ref 11.2–14.5)
WBC: 7.3 10*3/uL (ref 3.9–10.3)

## 2015-02-18 LAB — COMPREHENSIVE METABOLIC PANEL (CC13)
ALT: 10 U/L (ref 0–55)
AST: 18 U/L (ref 5–34)
Albumin: 3 g/dL — ABNORMAL LOW (ref 3.5–5.0)
Alkaline Phosphatase: 66 U/L (ref 40–150)
Anion Gap: 10 mEq/L (ref 3–11)
BILIRUBIN TOTAL: 0.25 mg/dL (ref 0.20–1.20)
BUN: 10.4 mg/dL (ref 7.0–26.0)
CALCIUM: 9.6 mg/dL (ref 8.4–10.4)
CHLORIDE: 110 meq/L — AB (ref 98–109)
CO2: 24 mEq/L (ref 22–29)
CREATININE: 0.9 mg/dL (ref 0.6–1.1)
EGFR: 69 mL/min/{1.73_m2} — ABNORMAL LOW (ref >90)
Glucose: 90 mg/dl (ref 70–140)
Potassium: 4.3 mEq/L (ref 3.5–5.1)
Sodium: 144 mEq/L (ref 136–145)
Total Protein: 6.4 g/dL (ref 6.4–8.3)

## 2015-02-18 MED ORDER — FENTANYL 100 MCG/HR TD PT72: MEDICATED_PATCH | TRANSDERMAL | Status: DC

## 2015-02-18 MED ORDER — FENTANYL 75 MCG/HR TD PT72: MEDICATED_PATCH | TRANSDERMAL | Status: DC

## 2015-02-18 NOTE — Telephone Encounter (Signed)
Per staff message and POF I have scheduled appts. Advised scheduler of appts. JMW  

## 2015-02-18 NOTE — Telephone Encounter (Signed)
per pof to sch pt appt-sent MW email to sch pt trmt-gave copy of sch

## 2015-02-18 NOTE — Progress Notes (Signed)
OFFICE PROGRESS NOTE   February 18, 2015   Physicians:  Alphonsa Overall, Matthias Hughs (PCP Ledell Noss), W.Brewster, J.Kinard, S.[name], Scarlette Shorts  INTERVAL HISTORY:  Patient is seen, together with 2 daughters, to discuss restaging CT AP done 02-15-15 for poorly differentiated endometrial cancer recurrent to pelvis. She has most recently been treated with 4 cycles of doxil given 11-03-14 thru 01-26-15. Present scans were compared to CT 05-2014 and PET 07-2014.  Patient has tolerated doxil well including cycle 4, and has had good pain control since change in brand of fentanyl patches. The present patches, total 175 mcg, adhere without difficulty and she has needed only very occasional oxycodone for breakthru pain. She has skin irritation at left thumbnail and some dryness on back, but otherwise no skin reaction from doxil since using coconut oil. She has had no nausea, good appetite, no problems with diverting colostomy. She felt well enough to enjoy trip to beach recently.  PAC in, last flushed with doxil 01-26-15 (not used for CT) Flu vaccine done  ONCOLOGIC HISTORY Patient had robotic assisted total laparoscopic hysterectomy bilateral salpingo-oophorectomy right pelvic lymph node biopsy on 12/19/2011. 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 There was no involvement of other organs lymphovascular invasion was not identified. 3 lymph nodes were negative for metastatic disease. 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  Patient was seen by Dr. [name] on 04/29/2013 with a nodule in the vagina concerning for recurrence as well as 8 cm rectal mass. CT of the abdomen and pelvis showed a lobular mass at the vagina cuff measuring 7.0 x 3.2 cm proximally 4 cm craniocaudal  dimension, with compression of the distal aspect of the sigmoid colon just above the rectum . There was also noted to be a large necrotic lymph node measuring 2.8 cm within the sigmoid mesocolon centrally. There are also small bilateral external iliac lymph nodes. There were no aggressive osseous lesions. 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  Patient received 6 cycles of taxol carboplatin from 06/03/2013 thru 16/10//9604, complicated by some peripheral neuropathy. There was an interval decrease in tumor burden therefore Dr. [name] recommended 3 additional cycles of Taxotere/Carboplatin, received from 11/27/13 through 01/15/14.  CT of the chest, abdomen, and pelvis on 02/10/14 demonstrated disease progression. She had radiation by Dr Sondra Come, [place] given from 02/23/14 thru 03-27-14. PET  September 2015 had increased uptake in central pelvis involving the sigmoid colon, with rectovaginal mass confirmed on exam by gyn oncology. Patient declined total pelvic exenteration. Colonoscopy 09-08-14 confirmed near complete rectal obstruction from external mass. She had diverting colostomy by Dr [name] 09-24-14, no disease visualized outside of pelvis. She had 4 cycles of doxil from 11-03-14 thru 01-26-15, tolerated well. CT AP 02-15-15 had no disease outside of pelvis and some increase in the involved areas at vaginal cuff and right iliacus (compared with scans from July and Sept 2015).       Review of systems as above, also: No fever or symptoms of infection. No respiratory symptoms. Is able to sleep. Bladder ok. No bleeding. Has been able to do some yardwork. Remainder of 10 point Review of Systems negative.  Objective:  Vital signs in last 24 hours:  BP 141/73 mmHg  Pulse 70  Temp(Src) 97.4 F (36.3 C) (  Oral)  Resp 18  Ht 5' (1.524 m)  Wt 125 lb 12.8 oz (57.063 kg)  BMI 24.57 kg/m2  SpO2 100% Weight down ~ 4 lbs since Megace dose  decreased. Alert, oriented and appropriate. Ambulatory without difficulty.  No alopecia  HEENT:PERRL, sclerae not icteric. Oral mucosa moist without lesions, posterior pharynx clear.  Neck supple. No JVD.  Lymphatics:no cervical,supraclavicular, axillary or inguinal adenopathy Resp: clear to auscultation bilaterally and normal percussion bilaterally Cardio: regular rate and rhythm. No gallop. GI: soft, nontender, not distended, no mass or organomegaly. Normally active bowel sounds. Surgical incision not remarkable. Diverting colostomy with soft stool, no skin irritation around appliance. Musculoskeletal/ Extremities: without pitting edema, cords, tenderness Neuro: nonfocal . PSYCH appropriate mood and affect Skin without rash, ecchymosis, petechiae. Irritation adjacent to left thumb without evidence of infection, some dryness mid back.  Portacath-without erythema or tenderness  Lab Results:  Results for orders placed or performed in visit on 02/18/15  CBC with Differential  Result Value Ref Range   WBC 7.3 3.9 - 10.3 10e3/uL   NEUT# 4.5 1.5 - 6.5 10e3/uL   HGB 9.5 (L) 11.6 - 15.9 g/dL   HCT 30.5 (L) 34.8 - 46.6 %   Platelets 227 145 - 400 10e3/uL   MCV 94.4 79.5 - 101.0 fL   MCH 29.4 25.1 - 34.0 pg   MCHC 31.1 (L) 31.5 - 36.0 g/dL   RBC 3.23 (L) 3.70 - 5.45 10e6/uL   RDW 16.5 (H) 11.2 - 14.5 %   lymph# 1.7 0.9 - 3.3 10e3/uL   MONO# 1.0 (H) 0.1 - 0.9 10e3/uL   Eosinophils Absolute 0.2 0.0 - 0.5 10e3/uL   Basophils Absolute 0.0 0.0 - 0.1 10e3/uL   NEUT% 61.2 38.4 - 76.8 %   LYMPH% 22.6 14.0 - 49.7 %   MONO% 13.4 0.0 - 14.0 %   EOS% 2.7 0.0 - 7.0 %   BASO% 0.1 0.0 - 2.0 %  Comprehensive metabolic panel (Cmet) - [facility]  Result Value Ref Range   Sodium 144 136 - 145 mEq/L   Potassium 4.3 3.5 - 5.1 mEq/L   Chloride 110 (H) 98 - 109 mEq/L   CO2 24 22 - 29 mEq/L   Glucose 90 70 - 140 mg/dl   BUN 10.4 7.0 - 26.0 mg/dL   Creatinine 0.9 0.6 - 1.1 mg/dL   Total Bilirubin 0.25 0.20  - 1.20 mg/dL   Alkaline Phosphatase 66 40 - 150 U/L   AST 18 5 - 34 U/L   ALT 10 0 - 55 U/L   Total Protein 6.4 6.4 - 8.3 g/dL   Albumin 3.0 (L) 3.5 - 5.0 g/dL   Calcium 9.6 8.4 - 10.4 mg/dL   Anion Gap 10 3 - 11 mEq/L   EGFR 69 (L) >90 ml/min/1.73 m2     Studies/Results: EXAM: CT ABDOMEN AND PELVIS WITH CONTRAST  TECHNIQUE: Multidetector CT imaging of the abdomen and pelvis was performed using the standard protocol following bolus administration of intravenous contrast.  CONTRAST: 129mL OMNIPAQUE IOHEXOL 300 MG/ML SOLN  COMPARISON: PET-CT from 07/22/2014. CT scan from 05/19/2014.  FINDINGS: Lower chest: Unremarkable.  Hepatobiliary: No focal abnormality within the liver parenchyma. There is no evidence for gallstones, gallbladder wall thickening, or pericholecystic fluid. Mild prominence of intrahepatic bile ducts is unchanged. No extrahepatic biliary duct dilatation.  Pancreas: 8 mm cystic lesion in the head of the pancreas (image 24 series 3) is unchanged since the prior study and also comparing back 05/19/2014. No dilatation of  the main pancreatic duct.  Spleen: No splenomegaly. No focal mass lesion.  Adrenals/Urinary Tract: No adrenal nodule or mass. Prominent scarring is seen in the cortex of both kidneys, right greater than left. 15 mm cyst in the upper pole of the left kidney is stable.  Stomach/Bowel: Stomach is nondistended. No gastric wall thickening. No evidence of outlet obstruction. Duodenum is normally positioned as is the ligament of Treitz. No small bowel wall thickening. No small bowel dilatation. Terminal ileum is normal. The appendix is not visualized, but there is no edema or inflammation in the region of the cecum. Left lower quadrant sigmoid loop colostomy is evident. Circumferential wall thickening is seen in the sigmoid colon as it courses anterior to the uterus and also in the rectum, immediately posterior to the uterus. These  colonic changes are presumably secondary to radiation treatment.  Vascular/Lymphatic: There is abdominal aortic atherosclerosis without aneurysm. Portal vein and SMV are patent. Splenic vein is patent.  Reproductive: Repeat irregular nodule seen along the vaginal cuff and central pelvis on the prior CT scan have progressed substantially in the interval. On today's study, there is a 9.3 x 4.8 x 8.6 cm heterogeneously enhancing lesion in the central pelvis, involving the vaginal cuff. This is associated with edema in the soft tissues of the pelvic floor.  Other: No substantial intraperitoneal free fluid.  Musculoskeletal: The right iliacus lesions seen previously has progressed slightly in the interval, measuring 3.1 x 2.1 cm today compared to 1.8 x 2.4 cm previously. Bone windows reveal no worrisome lytic or sclerotic osseous lesions.  IMPRESSION: Marked interval progression of metastatic disease in the central pelvis, with a 9 x 5 x 9 cm necrotic heterogeneously enhancing mass now evident.  More modest interval progression of metastatic disease involving the right iliacus muscle.  8 mm cystic lesion in the pancreatic head is stable since 05/19/2014. Continued attention to this region on followup imaging is recommended.   PACs images reviewed by MD. CT information discussed with patient and family, who did not wish to see images.   Medications: I have reviewed the patient's current medications. Continues Megace, dose reduced previously. Continues oral iron  DISCUSSION: Clinically she has tolerated doxil well and is feeling the best now that she has in months. Given timing from last scans to start of doxil, it is not clear what response, if any, the disease has had to doxil, however there is no new involvement in pelvis or abdomen. Options discussed include additional 2 cycles of doxil and repeat scans, treatment break with close observation, change in therapy (topotecan,  etoposide, oxaliplatin; megace/tamoxifen reportely more useful in grades 1-2 and AI not generally useful); avastin + chemo).  After discussion, patient and family prefer 2 additional cycles of doxil then repeat scans. It may be helpful to have gyn oncology see her back at that point also.  Assessment/Plan:  1.poorly differentiated endometrial carcinoma IA, grade 1 with no high risk features at surgery 12-2011, then recurrent to vagina 04-2013 and progressive in pelvis despite chemo and RT. 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 Doxil x4 cycles from 11-03-14 thru 01-26-15. CTs show disease still limited to pelvis, not clear if doxil is improving or controlling by scans due to timing of imaging in relation to doxil, but clinically doing very well so will give additional 2 cycles of doxil and re-evaluate. Continue pain medication with fentanyl patches 175 mcg using brands that adhere adequately. 2.PAC in 3.multifactoria anemia: Hgb  stable at 9.5, not more symptomatic and no overt bleeding. Continue oral iron and follow.  4.nephrolithiasis, left breast biopsies  5.flu vaccine done 6.advance directives in place 7.good EF by echocardiogram 07-23-14 8.ostomy functioning well 9.appetite still good with megace once daily   All questions answered. Will give cycle 5 doxil ~ 02-23-15 and I will see her back in early May. Chemo orders placed, ice packs to hands during doxil infusion. CC this note to Drs Scotty Court and [name]. TIme spent 45 min including >50% counseling and coordination of care.   Marykatherine Sherwood P, MD   02/18/2015, 2:15 PM

## 2015-02-18 NOTE — Telephone Encounter (Signed)
Advised scheduler to move labs 

## 2015-02-19 ENCOUNTER — Telehealth: Payer: Self-pay | Admitting: Oncology

## 2015-02-19 NOTE — Telephone Encounter (Signed)
per pof to sch pt appt-cld & spoke to pt to adv of time & da

## 2015-02-21 ENCOUNTER — Other Ambulatory Visit: Payer: Self-pay | Admitting: Oncology

## 2015-02-21 DIAGNOSIS — C7989 Secondary malignant neoplasm of other specified sites: Secondary | ICD-10-CM

## 2015-02-21 DIAGNOSIS — G893 Neoplasm related pain (acute) (chronic): Secondary | ICD-10-CM | POA: Insufficient documentation

## 2015-02-23 ENCOUNTER — Other Ambulatory Visit: Payer: Medicare Other

## 2015-02-23 ENCOUNTER — Other Ambulatory Visit (HOSPITAL_BASED_OUTPATIENT_CLINIC_OR_DEPARTMENT_OTHER): Payer: Medicare Other

## 2015-02-23 ENCOUNTER — Ambulatory Visit (HOSPITAL_BASED_OUTPATIENT_CLINIC_OR_DEPARTMENT_OTHER): Payer: Medicare Other

## 2015-02-23 ENCOUNTER — Telehealth: Payer: Self-pay

## 2015-02-23 VITALS — BP 121/56 | HR 66 | Temp 96.5°F | Resp 18

## 2015-02-23 DIAGNOSIS — C541 Malignant neoplasm of endometrium: Secondary | ICD-10-CM

## 2015-02-23 DIAGNOSIS — C7989 Secondary malignant neoplasm of other specified sites: Secondary | ICD-10-CM

## 2015-02-23 DIAGNOSIS — R300 Dysuria: Secondary | ICD-10-CM

## 2015-02-23 DIAGNOSIS — Z5111 Encounter for antineoplastic chemotherapy: Secondary | ICD-10-CM | POA: Diagnosis present

## 2015-02-23 DIAGNOSIS — N200 Calculus of kidney: Secondary | ICD-10-CM

## 2015-02-23 LAB — CBC WITH DIFFERENTIAL/PLATELET
BASO%: 0.2 % (ref 0.0–2.0)
Basophils Absolute: 0 10*3/uL (ref 0.0–0.1)
EOS%: 0.6 % (ref 0.0–7.0)
Eosinophils Absolute: 0.1 10*3/uL (ref 0.0–0.5)
HCT: 34.9 % (ref 34.8–46.6)
HGB: 10.9 g/dL — ABNORMAL LOW (ref 11.6–15.9)
LYMPH%: 13.3 % — AB (ref 14.0–49.7)
MCH: 29.4 pg (ref 25.1–34.0)
MCHC: 31.2 g/dL — AB (ref 31.5–36.0)
MCV: 94.1 fL (ref 79.5–101.0)
MONO#: 1 10*3/uL — ABNORMAL HIGH (ref 0.1–0.9)
MONO%: 7.8 % (ref 0.0–14.0)
NEUT#: 9.8 10*3/uL — ABNORMAL HIGH (ref 1.5–6.5)
NEUT%: 78.1 % — AB (ref 38.4–76.8)
PLATELETS: 281 10*3/uL (ref 145–400)
RBC: 3.71 10*6/uL (ref 3.70–5.45)
RDW: 16 % — ABNORMAL HIGH (ref 11.2–14.5)
WBC: 12.5 10*3/uL — ABNORMAL HIGH (ref 3.9–10.3)
lymph#: 1.7 10*3/uL (ref 0.9–3.3)

## 2015-02-23 LAB — URINALYSIS, MICROSCOPIC - CHCC
Glucose: NEGATIVE mg/dL
PH: 5 (ref 4.6–8.0)
Protein: 100 mg/dL
SPECIFIC GRAVITY, URINE: 1.025 (ref 1.003–1.035)

## 2015-02-23 MED ORDER — SODIUM CHLORIDE 0.9 % IV SOLN
Freq: Once | INTRAVENOUS | Status: AC
  Administered 2015-02-23: 11:00:00 via INTRAVENOUS

## 2015-02-23 MED ORDER — DOXORUBICIN HCL LIPOSOMAL CHEMO INJECTION 2 MG/ML
39.0000 mg/m2 | Freq: Once | INTRAVENOUS | Status: AC
  Administered 2015-02-23: 60 mg via INTRAVENOUS
  Filled 2015-02-23: qty 30

## 2015-02-23 MED ORDER — SODIUM CHLORIDE 0.9 % IV SOLN
Freq: Once | INTRAVENOUS | Status: AC
  Administered 2015-02-23: 12:00:00 via INTRAVENOUS
  Filled 2015-02-23: qty 4

## 2015-02-23 MED ORDER — CIPROFLOXACIN HCL 250 MG PO TABS: 250.0000 mg | ORAL_TABLET | Freq: Two times a day (BID) | ORAL | Status: DC

## 2015-02-23 MED ORDER — SODIUM CHLORIDE 0.9 % IJ SOLN
10.0000 mL | INTRAMUSCULAR | Status: DC | PRN
  Administered 2015-02-23: 10 mL
  Filled 2015-02-23: qty 10

## 2015-02-23 MED ORDER — HEPARIN SOD (PORK) LOCK FLUSH 100 UNIT/ML IV SOLN
500.0000 [IU] | Freq: Once | INTRAVENOUS | Status: AC | PRN
  Administered 2015-02-23: 500 [IU]
  Filled 2015-02-23: qty 5

## 2015-02-23 NOTE — Patient Instructions (Signed)
[facility] Discharge Instructions for Patients Receiving Chemotherapy  Today you received the following chemotherapy agents Doxil To help prevent nausea and vomiting after your treatment, we encourage you to take your nausea medication as prescribed.  If you develop nausea and vomiting that is not controlled by your nausea medication, call the clinic.   BELOW ARE SYMPTOMS THAT SHOULD BE REPORTED IMMEDIATELY:  *FEVER GREATER THAN 100.5 F  *CHILLS WITH OR WITHOUT FEVER  NAUSEA AND VOMITING THAT IS NOT CONTROLLED WITH YOUR NAUSEA MEDICATION  *UNUSUAL SHORTNESS OF BREATH  *UNUSUAL BRUISING OR BLEEDING  TENDERNESS IN MOUTH AND THROAT WITH OR WITHOUT PRESENCE OF ULCERS  *URINARY PROBLEMS  *BOWEL PROBLEMS  UNUSUAL RASH Items with * indicate a potential emergency and should be followed up as soon as possible.  Feel free to call the clinic you have any questions or concerns. The clinic phone number is [phone].  Please show the [facility] at check-in to the Emergency Department and triage nurse.   Dehydration, Adult Dehydration is when you lose more fluids from the body than you take in. Vital organs like the kidneys, brain, and heart cannot function without a proper amount of fluids and salt. Any loss of fluids from the body can cause dehydration.  CAUSES   Vomiting.  Diarrhea.  Excessive sweating.  Excessive urine output.  Fever. SYMPTOMS  Mild dehydration  Thirst.  Dry lips.  Slightly dry mouth. Moderate dehydration  Very dry mouth.  Sunken eyes.  Skin does not bounce back quickly when lightly pinched and released.  Dark urine and decreased urine production.  Decreased tear production.  Headache. Severe dehydration  Very dry mouth.  Extreme thirst.  Rapid, weak pulse (more than 100 beats per minute at rest).  Cold hands and feet.  Not able to sweat in spite of heat and temperature.  Rapid breathing.  Blue  lips.  Confusion and lethargy.  Difficulty being awakened.  Minimal urine production.  No tears. DIAGNOSIS  Your caregiver will diagnose dehydration based on your symptoms and your exam. Blood and urine tests will help confirm the diagnosis. The diagnostic evaluation should also identify the cause of dehydration. TREATMENT  Treatment of mild or moderate dehydration can often be done at home by increasing the amount of fluids that you drink. It is best to drink small amounts of fluid more often. Drinking too much at one time can make vomiting worse. Refer to the home care instructions below. Severe dehydration needs to be treated at the hospital where you will probably be given intravenous (IV) fluids that contain water and electrolytes. HOME CARE INSTRUCTIONS   Ask your caregiver about specific rehydration instructions.  Drink enough fluids to keep your urine clear or pale yellow.  Drink small amounts frequently if you have nausea and vomiting.  Eat as you normally do.  Avoid:  Foods or drinks high in sugar.  Carbonated drinks.  Juice.  Extremely hot or cold fluids.  Drinks with caffeine.  Fatty, greasy foods.  Alcohol.  Tobacco.  Overeating.  Gelatin desserts.  Wash your hands well to avoid spreading bacteria and viruses.  Only take over-the-counter or prescription medicines for pain, discomfort, or fever as directed by your caregiver.  Ask your caregiver if you should continue all prescribed and over-the-counter medicines.  Keep all follow-up appointments with your caregiver. SEEK MEDICAL CARE IF:  You have abdominal pain and it increases or stays in one area (localizes).  You have a rash, stiff neck,  or severe headache.  You are irritable, sleepy, or difficult to awaken.  You are weak, dizzy, or extremely thirsty. SEEK IMMEDIATE MEDICAL CARE IF:   You are unable to keep fluids down or you get worse despite treatment.  You have frequent episodes of  vomiting or diarrhea.  You have blood or green matter (bile) in your vomit.  You have blood in your stool or your stool looks black and tarry.  You have not urinated in 6 to 8 hours, or you have only urinated a small amount of very dark urine.  You have a fever.  You faint. MAKE SURE YOU:   Understand these instructions.  Will watch your condition.  Will get help right away if you are not doing well or get worse. Document Released: 10/30/2005 Document Revised: 01/22/2012 Document Reviewed: 06/19/2011 [facility] Patient Information 2015 [place], [place]. This information is not intended to replace advice given to you by your health care provider. Make sure you discuss any questions you have with your health care provider.

## 2015-02-23 NOTE — Telephone Encounter (Signed)
-----   Message from Gordy Levan, MD sent at 02/23/2015 11:13 AM EDT ----- I spoke with Hollace Kinnier in infusion now, patient c/o dysuria so sent UA C&S. If UA has >=10-20 WBC please start cipro 250 bid x 3 day. Please follow up urine culture this week until final  Barnetta Chapel is to let you or me know if urine looks concerning, but I did not tell her these parameters. Not febrile. BP a little low today after taking 2 BP meds, so Barnetta Chapel also giving some extra IVF with the doxil as scheduled.  thanks

## 2015-02-23 NOTE — Telephone Encounter (Signed)
Told Pam Vazquez that the WBC in UA is TNTC.  Dr. [name] wants her to start on Cipro 250 bid x 3days.  Prescription sent to her pharmacy.  Will follow culture and let her know if she needs a different ATB or more cipro.  Patient verbalized understanding.

## 2015-02-23 NOTE — Progress Notes (Signed)
Patients blood pressure 104/46 today in clinic. Pam Vazquez complains of frequent urination accompanied by burning with urination. Dr. [name] notified. UA C&S ordered, 500 mL of normal saline ordered. Pam Vazquez to check blood pressure at home if systolic is below 330 Pam Vazquez is to hold blood pressure medication.Continue with chemotherapy as planned for today. Pam Vazquez verbalized understanding with above mentioned plan.

## 2015-02-24 LAB — URINE CULTURE

## 2015-02-26 NOTE — Telephone Encounter (Signed)
Pam Vazquez states that the urinary burning has greatly improved with cipro  She has one more dose left.  Told her to finish the ciproper Dr. [name].   The culture showed multiple species of bacteria  which is from contamination of specimen.  Ms. Berch needs to call if she develops symptoms and another urine specimen would need to be collected.

## 2015-03-11 ENCOUNTER — Telehealth: Payer: Self-pay | Admitting: *Deleted

## 2015-03-11 NOTE — Telephone Encounter (Signed)
VM message from pt's daughter, Pam Vazquez regarding upcoming appts.  Attempted call back. No answer,LVM to return call to [facility] triage.

## 2015-03-12 ENCOUNTER — Other Ambulatory Visit: Payer: Self-pay

## 2015-03-12 ENCOUNTER — Telehealth: Payer: Self-pay

## 2015-03-12 DIAGNOSIS — C541 Malignant neoplasm of endometrium: Secondary | ICD-10-CM

## 2015-03-12 NOTE — Telephone Encounter (Signed)
Spoke with Tammy and told her that the Doxil treatment would be due on 03-23-15 as treatment is every 4 weeks and the last treatment was on 02-23-15.  Cancelled treatment on 03-15-15.  Patient to keep appointment as scheduled on 03-15-15 with Dr. [name].  Daughter verbalized understanding.

## 2015-03-12 NOTE — Telephone Encounter (Signed)
tammy calling for mother. Pt has infusion appt on 5/2. This appears to be 1 week too soon. Please advise

## 2015-03-14 ENCOUNTER — Other Ambulatory Visit: Payer: Self-pay | Admitting: Oncology

## 2015-03-15 ENCOUNTER — Other Ambulatory Visit (HOSPITAL_BASED_OUTPATIENT_CLINIC_OR_DEPARTMENT_OTHER): Payer: Medicare Other

## 2015-03-15 ENCOUNTER — Ambulatory Visit: Payer: Medicare Other

## 2015-03-15 ENCOUNTER — Encounter: Payer: Self-pay | Admitting: Oncology

## 2015-03-15 ENCOUNTER — Ambulatory Visit (HOSPITAL_BASED_OUTPATIENT_CLINIC_OR_DEPARTMENT_OTHER): Payer: Medicare Other | Admitting: Oncology

## 2015-03-15 ENCOUNTER — Telehealth: Payer: Self-pay | Admitting: Oncology

## 2015-03-15 VITALS — BP 127/67 | HR 69 | Temp 98.1°F | Resp 18 | Ht 60.0 in | Wt 123.7 lb

## 2015-03-15 DIAGNOSIS — Z95828 Presence of other vascular implants and grafts: Secondary | ICD-10-CM

## 2015-03-15 DIAGNOSIS — N200 Calculus of kidney: Secondary | ICD-10-CM

## 2015-03-15 DIAGNOSIS — G893 Neoplasm related pain (acute) (chronic): Secondary | ICD-10-CM

## 2015-03-15 DIAGNOSIS — C541 Malignant neoplasm of endometrium: Secondary | ICD-10-CM | POA: Diagnosis present

## 2015-03-15 DIAGNOSIS — C7989 Secondary malignant neoplasm of other specified sites: Secondary | ICD-10-CM | POA: Diagnosis not present

## 2015-03-15 DIAGNOSIS — R300 Dysuria: Secondary | ICD-10-CM

## 2015-03-15 DIAGNOSIS — D649 Anemia, unspecified: Secondary | ICD-10-CM

## 2015-03-15 DIAGNOSIS — T451X5A Adverse effect of antineoplastic and immunosuppressive drugs, initial encounter: Secondary | ICD-10-CM

## 2015-03-15 DIAGNOSIS — D6481 Anemia due to antineoplastic chemotherapy: Secondary | ICD-10-CM

## 2015-03-15 LAB — COMPREHENSIVE METABOLIC PANEL (CC13)
ALBUMIN: 2.7 g/dL — AB (ref 3.5–5.0)
ALT: 12 U/L (ref 0–55)
AST: 16 U/L (ref 5–34)
Alkaline Phosphatase: 72 U/L (ref 40–150)
Anion Gap: 11 mEq/L (ref 3–11)
BUN: 18.6 mg/dL (ref 7.0–26.0)
CHLORIDE: 109 meq/L (ref 98–109)
CO2: 22 mEq/L (ref 22–29)
Calcium: 10.3 mg/dL (ref 8.4–10.4)
Creatinine: 1 mg/dL (ref 0.6–1.1)
EGFR: 59 mL/min/{1.73_m2} — ABNORMAL LOW (ref >90)
Glucose: 101 mg/dl (ref 70–140)
POTASSIUM: 4.3 meq/L (ref 3.5–5.1)
Sodium: 142 mEq/L (ref 136–145)
TOTAL PROTEIN: 6.5 g/dL (ref 6.4–8.3)
Total Bilirubin: 0.29 mg/dL (ref 0.20–1.20)

## 2015-03-15 LAB — URINALYSIS, MICROSCOPIC - CHCC
BILIRUBIN (URINE): NEGATIVE
GLUCOSE UR CHCC: NEGATIVE mg/dL
KETONES: NEGATIVE mg/dL
Nitrite: NEGATIVE
Protein: 30 mg/dL
SPECIFIC GRAVITY, URINE: 1.02 (ref 1.003–1.035)
UROBILINOGEN UR: 0.2 mg/dL (ref 0.2–1)
pH: 6 (ref 4.6–8.0)

## 2015-03-15 LAB — CBC WITH DIFFERENTIAL/PLATELET
BASO%: 0.6 % (ref 0.0–2.0)
Basophils Absolute: 0.1 10*3/uL (ref 0.0–0.1)
EOS ABS: 0.1 10*3/uL (ref 0.0–0.5)
EOS%: 1.5 % (ref 0.0–7.0)
HEMATOCRIT: 26.4 % — AB (ref 34.8–46.6)
HGB: 8.5 g/dL — ABNORMAL LOW (ref 11.6–15.9)
LYMPH%: 12.6 % — AB (ref 14.0–49.7)
MCH: 29.1 pg (ref 25.1–34.0)
MCHC: 32.1 g/dL (ref 31.5–36.0)
MCV: 90.8 fL (ref 79.5–101.0)
MONO#: 1.1 10*3/uL — ABNORMAL HIGH (ref 0.1–0.9)
MONO%: 13 % (ref 0.0–14.0)
NEUT#: 6.2 10*3/uL (ref 1.5–6.5)
NEUT%: 72.3 % (ref 38.4–76.8)
Platelets: 318 10*3/uL (ref 145–400)
RBC: 2.91 10*6/uL — AB (ref 3.70–5.45)
RDW: 17.2 % — ABNORMAL HIGH (ref 11.2–14.5)
WBC: 8.6 10*3/uL (ref 3.9–10.3)
lymph#: 1.1 10*3/uL (ref 0.9–3.3)

## 2015-03-15 MED ORDER — GABAPENTIN 100 MG PO CAPS: ORAL_CAPSULE | ORAL | Status: DC

## 2015-03-15 MED ORDER — FENTANYL 75 MCG/HR TD PT72
MEDICATED_PATCH | TRANSDERMAL | Status: DC
Start: 2015-03-15 — End: 2015-04-15

## 2015-03-15 MED ORDER — FENTANYL 100 MCG/HR TD PT72: MEDICATED_PATCH | TRANSDERMAL | Status: DC

## 2015-03-15 NOTE — Progress Notes (Signed)
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 Last CT AP was done 02-15-15, after 4 cycles of doxil; plan is to repeat scans after cycle 6 doxil  Patient is more anemic today and has had more fatigue, but is still doing most of regular activities (wants to [employer] with riding mower today) and is otherwise tolerating the doxil well. She had no skin reaction with cycle 5, very compliant with skin care. Pain is generally well controlled with duragesic 175 mcg every 72 hours, using oxycodone total 20 mg one or 2 times daily in addition. Bowels are moving regularly by ostomy with the miralax. She has had no nausea. She denies bleeding. She has had slight dysuria and we will recheck urine (last UA appeared infected but culture multiple species, symptoms did improve with short course antibiotics then).    PAC in Flu vaccine done  ONCOLOGIC HISTORY Patient had robotic assisted total laparoscopic hysterectomy bilateral salpingo-oophorectomy right pelvic lymph node biopsy on 12/19/2011. 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 There was no involvement of other organs lymphovascular invasion was not identified. 3 lymph nodes were negative for metastatic disease. 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  Patient was seen by Dr. [name] on 04/29/2013 with a nodule in the vagina concerning for recurrence as well as 8 cm rectal mass. CT of the abdomen and pelvis showed a lobular  mass at the vagina cuff measuring 7.0 x 3.2 cm proximally 4 cm craniocaudal dimension, with compression of the distal aspect of the sigmoid colon just above the rectum . There was also noted to be a large necrotic lymph node measuring 2.8 cm within the sigmoid mesocolon centrally. There are also small bilateral external iliac lymph nodes. There were no aggressive osseous lesions. 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  Patient received 6 cycles of taxol carboplatin from 06/03/2013 thru 06/23//7628, complicated by some peripheral neuropathy. There was an interval decrease in tumor burden therefore Dr. [name] recommended 3 additional cycles of Taxotere/Carboplatin, received from 11/27/13 through 01/15/14.  CT of the chest, abdomen, and pelvis on 02/10/14 demonstrated disease progression. She had radiation by Dr Sondra Come, [place] given from 02/23/14 thru 03-27-14. PET September 2015 had increased uptake in central pelvis involving the sigmoid colon, with rectovaginal mass confirmed on exam by gyn oncology. Patient declined total pelvic exenteration. Colonoscopy 09-08-14 confirmed near complete rectal obstruction from external mass. She had diverting colostomy by Dr [name] 09-24-14, no disease visualized outside of pelvis. She had 4 cycles of doxil from 11-03-14 thru 01-26-15, tolerated well. CT AP 02-15-15 had no disease outside of pelvis and some increase in the involved areas at vaginal cuff and right iliacus compared with scans from July and Sept 2015.     Review of systems as above, also:  No fever or other symptoms of infection. No SOB at rest. No LE swelling. Bladder ok No chest pain Remainder of 10 point Review of Systems negative.  Objective:  Vital signs in last 24 hours:  BP  127/67 mmHg  Pulse 69  Temp(Src) 98.1 F (36.7 C) (Oral)  Resp 18  Ht 5' (1.524 m)  Wt 123 lb 11.2 oz (56.11 kg)  BMI 24.16 kg/m2 Pale not icteric Alert,  oriented and appropriate. Ambulatory without difficulty. Looks comfortable. No alopecia  HEENT:PERRL, sclerae not icteric. Oral mucosa moist without lesions, posterior pharynx clear.  Neck supple. No JVD.  Lymphatics:no cervical,suraclavicular, axillary or inguinal adenopathy Resp: clear to auscultation bilaterally and normal percussion bilaterally Cardio: regular rate and rhythm. No gallop. GI: soft, nontender, not distended, no mass or organomegaly. Normally active bowel sounds. Surgical incision not remarkable. Musculoskeletal/ Extremities: without pitting edema, cords, tenderness Neuro: no peripheral neuropathy. Otherwise nonfocal Skin without rash, ecchymosis, petechiae Breasts: without dominant mass, skin or nipple findings. Axillae benign. Portacath-without erythema or tenderness  Lab Results:  Results for orders placed or performed in visit on 03/15/15  CBC with Differential  Result Value Ref Range   WBC 8.6 3.9 - 10.3 10e3/uL   NEUT# 6.2 1.5 - 6.5 10e3/uL   HGB 8.5 (L) 11.6 - 15.9 g/dL   HCT 26.4 (L) 34.8 - 46.6 %   Platelets 318 145 - 400 10e3/uL   MCV 90.8 79.5 - 101.0 fL   MCH 29.1 25.1 - 34.0 pg   MCHC 32.1 31.5 - 36.0 g/dL   RBC 2.91 (L) 3.70 - 5.45 10e6/uL   RDW 17.2 (H) 11.2 - 14.5 %   lymph# 1.1 0.9 - 3.3 10e3/uL   MONO# 1.1 (H) 0.1 - 0.9 10e3/uL   Eosinophils Absolute 0.1 0.0 - 0.5 10e3/uL   Basophils Absolute 0.1 0.0 - 0.1 10e3/uL   NEUT% 72.3 38.4 - 76.8 %   LYMPH% 12.6 (L) 14.0 - 49.7 %   MONO% 13.0 0.0 - 14.0 %   EOS% 1.5 0.0 - 7.0 %   BASO% 0.6 0.0 - 2.0 %     Studies/Results:  No results found.  Medications: I have reviewed the patient's current medications. COntinuing po iron    DISCUSSION: plan for anemia as noted below. Restaging scans after upcoming doxil.    NOte she tolerates megace well for appetite, may consider alternating megace and tamoxifen if progression on upcoming scans.  Assessment/Plan:  1.poorly differentiated  endometrial carcinoma IA, grade 1 with no high risk features at surgery 12-2011, then recurrent to vagina 04-2013 and progressive in pelvis despite chemo and RT. 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 Doxil x4 cycles from 11-03-14 thru 01-26-15. CTs show disease still limited to pelvis, not clear if doxil is improving or controlling by scans due to timing of imaging in relation to doxil, but clinically doing very well so will give one additional cycle doxil (#6) and re-evaluate. Continue pain medication with fentanyl patches 175 mcg using brands that adhere adequately. 2.PAC in 3.multifactoria anemia: Hgb stable at 9.5, not more symptomatic and no overt bleeding. Continue oral iron and follow.  4.nephrolithiasis, left breast biopsies  5.flu vaccine done 6.advance directives in place 7.good EF by echocardiogram 07-23-14 8.ostomy functioning well 9.appetite still good with megace once daily   All questions answered. Chemo orders placed. She will have cycle 6 doxil on 5-9 as long as ANC >=1.5 and plt >=100k. She will need PRBCs if hemoglobin <=8.5 by repeat labs that day, or if more symptomatic. She will have CTs ~ 5-30 and I will see her after those.  Time spent 25 min incliuding >50% counseling and coordination of care.   LIVESAY,LENNIS P,  MD   03/15/2015, 11:14 AM

## 2015-03-15 NOTE — Telephone Encounter (Signed)
Made and printed avs for patient

## 2015-03-16 LAB — URINE CULTURE

## 2015-03-17 ENCOUNTER — Telehealth: Payer: Self-pay

## 2015-03-17 NOTE — Telephone Encounter (Signed)
Told Pam Vazquez that the urine culture showed that she did not have a UTI per Dr. [name].  Patient verbalized understanding.

## 2015-03-22 ENCOUNTER — Other Ambulatory Visit (HOSPITAL_BASED_OUTPATIENT_CLINIC_OR_DEPARTMENT_OTHER): Payer: Medicare Other

## 2015-03-22 ENCOUNTER — Ambulatory Visit (HOSPITAL_BASED_OUTPATIENT_CLINIC_OR_DEPARTMENT_OTHER): Payer: Medicare Other

## 2015-03-22 ENCOUNTER — Telehealth: Payer: Self-pay

## 2015-03-22 VITALS — BP 136/56 | HR 76 | Temp 98.2°F | Resp 18

## 2015-03-22 DIAGNOSIS — C541 Malignant neoplasm of endometrium: Secondary | ICD-10-CM

## 2015-03-22 DIAGNOSIS — C7989 Secondary malignant neoplasm of other specified sites: Secondary | ICD-10-CM

## 2015-03-22 DIAGNOSIS — Z5111 Encounter for antineoplastic chemotherapy: Secondary | ICD-10-CM

## 2015-03-22 LAB — COMPREHENSIVE METABOLIC PANEL (CC13)
ALT: 13 U/L (ref 0–55)
AST: 16 U/L (ref 5–34)
Albumin: 2.8 g/dL — ABNORMAL LOW (ref 3.5–5.0)
Alkaline Phosphatase: 75 U/L (ref 40–150)
Anion Gap: 13 mEq/L — ABNORMAL HIGH (ref 3–11)
BUN: 19.3 mg/dL (ref 7.0–26.0)
CALCIUM: 10.2 mg/dL (ref 8.4–10.4)
CO2: 23 mEq/L (ref 22–29)
CREATININE: 1.3 mg/dL — AB (ref 0.6–1.1)
Chloride: 108 mEq/L (ref 98–109)
EGFR: 44 mL/min/{1.73_m2} — ABNORMAL LOW (ref >90)
Glucose: 166 mg/dl — ABNORMAL HIGH (ref 70–140)
Potassium: 3.9 mEq/L (ref 3.5–5.1)
Sodium: 144 mEq/L (ref 136–145)
Total Bilirubin: 0.24 mg/dL (ref 0.20–1.20)
Total Protein: 6.8 g/dL (ref 6.4–8.3)

## 2015-03-22 LAB — CBC WITH DIFFERENTIAL/PLATELET
BASO%: 0.2 % (ref 0.0–2.0)
Basophils Absolute: 0 10*3/uL (ref 0.0–0.1)
EOS ABS: 0.1 10*3/uL (ref 0.0–0.5)
EOS%: 0.8 % (ref 0.0–7.0)
HEMATOCRIT: 28.2 % — AB (ref 34.8–46.6)
HEMOGLOBIN: 8.8 g/dL — AB (ref 11.6–15.9)
LYMPH%: 12.1 % — ABNORMAL LOW (ref 14.0–49.7)
MCH: 29.4 pg (ref 25.1–34.0)
MCHC: 31.2 g/dL — ABNORMAL LOW (ref 31.5–36.0)
MCV: 94.3 fL (ref 79.5–101.0)
MONO#: 0.9 10*3/uL (ref 0.1–0.9)
MONO%: 8.3 % (ref 0.0–14.0)
NEUT%: 78.6 % — ABNORMAL HIGH (ref 38.4–76.8)
NEUTROS ABS: 8.9 10*3/uL — AB (ref 1.5–6.5)
Platelets: 262 10*3/uL (ref 145–400)
RBC: 2.99 10*6/uL — ABNORMAL LOW (ref 3.70–5.45)
RDW: 16.5 % — AB (ref 11.2–14.5)
WBC: 11.4 10*3/uL — ABNORMAL HIGH (ref 3.9–10.3)
lymph#: 1.4 10*3/uL (ref 0.9–3.3)

## 2015-03-22 MED ORDER — SODIUM CHLORIDE 0.9 % IV SOLN
Freq: Once | INTRAVENOUS | Status: AC
  Administered 2015-03-22: 14:00:00 via INTRAVENOUS
  Filled 2015-03-22: qty 4

## 2015-03-22 MED ORDER — HEPARIN SOD (PORK) LOCK FLUSH 100 UNIT/ML IV SOLN
500.0000 [IU] | Freq: Once | INTRAVENOUS | Status: AC | PRN
  Administered 2015-03-22: 500 [IU]
  Filled 2015-03-22: qty 5

## 2015-03-22 MED ORDER — SODIUM CHLORIDE 0.9 % IV SOLN
Freq: Once | INTRAVENOUS | Status: AC
  Administered 2015-03-22: 14:00:00 via INTRAVENOUS

## 2015-03-22 MED ORDER — SODIUM CHLORIDE 0.9 % IJ SOLN
10.0000 mL | INTRAMUSCULAR | Status: DC | PRN
  Administered 2015-03-22: 10 mL
  Filled 2015-03-22: qty 10

## 2015-03-22 MED ORDER — DEXTROSE 5 % IV SOLN
39.0000 mg/m2 | Freq: Once | INTRAVENOUS | Status: AC
  Administered 2015-03-22: 60 mg via INTRAVENOUS
  Filled 2015-03-22: qty 30

## 2015-03-22 NOTE — Telephone Encounter (Signed)
Pam Vazquez encouraged to drink (8) 8oz glasses  of caffeine free fluid as her creatine was a little higher today.  She stated that she will work on it. She denies SOB.  Pt. Hgb up slightly to 8.8.   Pam Vazquez to see Dr. [name] 04-15-15.  She was told to call Dr. Mariana Kaufman office if she is SOB prior to the appointment so that another CBC could be drawn.

## 2015-03-22 NOTE — Patient Instructions (Signed)
[facility] Discharge Instructions for Patients Receiving Chemotherapy  Today you received the following chemotherapy agents :  Doxil.  To help prevent nausea and vomiting after your treatment, we encourage you to take your nausea medication as prescribed.   If you develop nausea and vomiting that is not controlled by your nausea medication, call the clinic.   BELOW ARE SYMPTOMS THAT SHOULD BE REPORTED IMMEDIATELY:  *FEVER GREATER THAN 100.5 F  *CHILLS WITH OR WITHOUT FEVER  NAUSEA AND VOMITING THAT IS NOT CONTROLLED WITH YOUR NAUSEA MEDICATION  *UNUSUAL SHORTNESS OF BREATH  *UNUSUAL BRUISING OR BLEEDING  TENDERNESS IN MOUTH AND THROAT WITH OR WITHOUT PRESENCE OF ULCERS  *URINARY PROBLEMS  *BOWEL PROBLEMS  UNUSUAL RASH Items with * indicate a potential emergency and should be followed up as soon as possible.  Feel free to call the clinic you have any questions or concerns. The clinic phone number is [phone].  Please show the CHEMO ALERT CARD at check-in to the Emergency Department and triage nurse.   

## 2015-03-22 NOTE — Telephone Encounter (Signed)
Patient Demographics     Patient Name Sex DOB SSN Address Phone    Pam Vazquez, Pam Vazquez Female 08/20/1946 [id number] [place] [address] [phone] [facility]) [phone] (Mobile)      Message  Received: 1 week ago    Gordy Levan, MD  Baruch Merl, RN; Christa See, RN           Fatigued with Hgb 8.5 on 03-15-15   Will repeat CBC with #6 doxil on 5-9. If lower and/or more symptomatic would transfuse 1 unit PRBs some time that week   thanks

## 2015-03-24 ENCOUNTER — Encounter (HOSPITAL_COMMUNITY): Payer: Self-pay | Admitting: Emergency Medicine

## 2015-03-24 ENCOUNTER — Telehealth: Payer: Self-pay

## 2015-03-24 ENCOUNTER — Telehealth: Payer: Self-pay | Admitting: *Deleted

## 2015-03-24 ENCOUNTER — Emergency Department (HOSPITAL_COMMUNITY): Payer: Medicare Other

## 2015-03-24 ENCOUNTER — Emergency Department (HOSPITAL_COMMUNITY)
Admission: EM | Admit: 2015-03-24 | Discharge: 2015-03-25 | Disposition: A | Discharge status: Home / Self Care | Payer: Medicare Other | Attending: Emergency Medicine | Admitting: Emergency Medicine

## 2015-03-24 DIAGNOSIS — I129 Hypertensive chronic kidney disease with stage 1 through stage 4 chronic kidney disease, or unspecified chronic kidney disease: Secondary | ICD-10-CM | POA: Insufficient documentation

## 2015-03-24 DIAGNOSIS — Z79899 Other long term (current) drug therapy: Secondary | ICD-10-CM | POA: Diagnosis not present

## 2015-03-24 DIAGNOSIS — R350 Frequency of micturition: Secondary | ICD-10-CM

## 2015-03-24 DIAGNOSIS — C7989 Secondary malignant neoplasm of other specified sites: Secondary | ICD-10-CM | POA: Insufficient documentation

## 2015-03-24 DIAGNOSIS — Z791 Long term (current) use of non-steroidal anti-inflammatories (NSAID): Secondary | ICD-10-CM | POA: Diagnosis not present

## 2015-03-24 DIAGNOSIS — M545 Low back pain, unspecified: Secondary | ICD-10-CM

## 2015-03-24 DIAGNOSIS — Z8542 Personal history of malignant neoplasm of other parts of uterus: Secondary | ICD-10-CM | POA: Diagnosis not present

## 2015-03-24 DIAGNOSIS — N189 Chronic kidney disease, unspecified: Secondary | ICD-10-CM | POA: Diagnosis not present

## 2015-03-24 DIAGNOSIS — E119 Type 2 diabetes mellitus without complications: Secondary | ICD-10-CM | POA: Diagnosis not present

## 2015-03-24 DIAGNOSIS — Z87891 Personal history of nicotine dependence: Secondary | ICD-10-CM | POA: Insufficient documentation

## 2015-03-24 DIAGNOSIS — N938 Other specified abnormal uterine and vaginal bleeding: Secondary | ICD-10-CM | POA: Insufficient documentation

## 2015-03-24 DIAGNOSIS — E876 Hypokalemia: Secondary | ICD-10-CM | POA: Diagnosis not present

## 2015-03-24 DIAGNOSIS — N939 Abnormal uterine and vaginal bleeding, unspecified: Secondary | ICD-10-CM

## 2015-03-24 DIAGNOSIS — C801 Malignant (primary) neoplasm, unspecified: Secondary | ICD-10-CM

## 2015-03-24 DIAGNOSIS — N390 Urinary tract infection, site not specified: Secondary | ICD-10-CM | POA: Diagnosis not present

## 2015-03-24 DIAGNOSIS — R5383 Other fatigue: Secondary | ICD-10-CM | POA: Diagnosis not present

## 2015-03-24 DIAGNOSIS — Z87442 Personal history of urinary calculi: Secondary | ICD-10-CM | POA: Diagnosis not present

## 2015-03-24 LAB — CBC WITH DIFFERENTIAL/PLATELET
BASOS ABS: 0 10*3/uL (ref 0.0–0.1)
Basophils Relative: 0 % (ref 0–1)
EOS ABS: 0 10*3/uL (ref 0.0–0.7)
Eosinophils Relative: 0 % (ref 0–5)
HCT: 30.7 % — ABNORMAL LOW (ref 36.0–46.0)
HEMOGLOBIN: 9.6 g/dL — AB (ref 12.0–15.0)
Lymphocytes Relative: 10 % — ABNORMAL LOW (ref 12–46)
Lymphs Abs: 1.8 10*3/uL (ref 0.7–4.0)
MCH: 29 pg (ref 26.0–34.0)
MCHC: 31.3 g/dL (ref 30.0–36.0)
MCV: 92.7 fL (ref 78.0–100.0)
Monocytes Absolute: 1.4 10*3/uL — ABNORMAL HIGH (ref 0.1–1.0)
Monocytes Relative: 8 % (ref 3–12)
NEUTROS PCT: 82 % — AB (ref 43–77)
Neutro Abs: 14.7 10*3/uL — ABNORMAL HIGH (ref 1.7–7.7)
Platelets: 338 10*3/uL (ref 150–400)
RBC: 3.31 MIL/uL — ABNORMAL LOW (ref 3.87–5.11)
RDW: 16.6 % — AB (ref 11.5–15.5)
WBC: 17.8 10*3/uL — ABNORMAL HIGH (ref 4.0–10.5)

## 2015-03-24 LAB — URINALYSIS, ROUTINE W REFLEX MICROSCOPIC
BILIRUBIN URINE: NEGATIVE
Glucose, UA: NEGATIVE mg/dL
KETONES UR: NEGATIVE mg/dL
NITRITE: NEGATIVE
PH: 7 (ref 5.0–8.0)
PROTEIN: NEGATIVE mg/dL
Specific Gravity, Urine: 1.006 (ref 1.005–1.030)
UROBILINOGEN UA: 0.2 mg/dL (ref 0.0–1.0)

## 2015-03-24 LAB — COMPREHENSIVE METABOLIC PANEL
ALT: 15 U/L (ref 14–54)
AST: 21 U/L (ref 15–41)
Albumin: 3.2 g/dL — ABNORMAL LOW (ref 3.5–5.0)
Alkaline Phosphatase: 70 U/L (ref 38–126)
Anion gap: 8 (ref 5–15)
BUN: 17 mg/dL (ref 6–20)
CHLORIDE: 106 mmol/L (ref 101–111)
CO2: 24 mmol/L (ref 22–32)
Calcium: 9.7 mg/dL (ref 8.9–10.3)
Creatinine, Ser: 0.62 mg/dL (ref 0.44–1.00)
GFR calc Af Amer: >60 mL/min (ref >60)
GFR calc non Af Amer: >60 mL/min (ref >60)
Glucose, Bld: 104 mg/dL — ABNORMAL HIGH (ref 70–99)
Potassium: 3.3 mmol/L — ABNORMAL LOW (ref 3.5–5.1)
Sodium: 138 mmol/L (ref 135–145)
Total Bilirubin: 1 mg/dL (ref 0.3–1.2)
Total Protein: 7.3 g/dL (ref 6.5–8.1)

## 2015-03-24 LAB — URINE MICROSCOPIC-ADD ON

## 2015-03-24 LAB — LIPASE, BLOOD: Lipase: 24 U/L (ref 22–51)

## 2015-03-24 MED ORDER — DEXTROSE 5 % IV SOLN
1.0000 g | Freq: Once | INTRAVENOUS | Status: AC
  Administered 2015-03-24: 1 g via INTRAVENOUS
  Filled 2015-03-24: qty 10

## 2015-03-24 MED ORDER — SODIUM CHLORIDE 0.9 % IV BOLUS (SEPSIS)
1000.0000 mL | Freq: Once | INTRAVENOUS | Status: AC
  Administered 2015-03-24: 1000 mL via INTRAVENOUS

## 2015-03-24 MED ORDER — FENTANYL CITRATE (PF) 100 MCG/2ML IJ SOLN
50.0000 ug | Freq: Once | INTRAMUSCULAR | Status: AC
  Administered 2015-03-24: 50 ug via INTRAVENOUS
  Filled 2015-03-24: qty 2

## 2015-03-24 MED ORDER — ONDANSETRON HCL 4 MG/2ML IJ SOLN
4.0000 mg | Freq: Once | INTRAMUSCULAR | Status: AC
  Administered 2015-03-24: 4 mg via INTRAVENOUS
  Filled 2015-03-24: qty 2

## 2015-03-24 MED ORDER — IOHEXOL 300 MG/ML  SOLN
50.0000 mL | Freq: Once | INTRAMUSCULAR | Status: AC | PRN
  Administered 2015-03-24: 50 mL via ORAL

## 2015-03-24 MED ORDER — IOHEXOL 300 MG/ML  SOLN
100.0000 mL | Freq: Once | INTRAMUSCULAR | Status: AC | PRN
  Administered 2015-03-24: 100 mL via INTRAVENOUS

## 2015-03-24 NOTE — ED Notes (Signed)
Upon my entering her room I find our P.A. In the midst of her exam.  Pt. Is awake, alert and in no distress and seems quite capable of assisting with her exam.

## 2015-03-24 NOTE — ED Notes (Signed)
Patient transported to CT 

## 2015-03-24 NOTE — ED Notes (Signed)
PA in room talking with patient and family I will collect labs when she finish.

## 2015-03-24 NOTE — ED Notes (Signed)
Pt had chemo recently for endometrial cancer.  Pt states that she started having chills, back pain, and vaginal bleeding that was bright red and then stopped.  Pt states that she has passed "gooey like stuff"

## 2015-03-24 NOTE — Telephone Encounter (Signed)
Pam Vazquez called stating that she began with chills/ sweating this am. She does not have a thermometer at home.   Has only had in 24-32 oz of fluid yesterday and ~24 oz today. Pt. began with some abdominal cramping and vaginal bleeding ~1200. Received Doxil chemotherapy on  03-22-15.  Denies any urinary burning ,frequency, pain.  No chest congestion.   Recommended that she go to [facility] ED to be evaluated as it is ~1550 and she lives in [place] and no one is at the house to bring her in.  She will call her daughter to come and bring her to the Hospital.  She may need IVF and evaluation for infection.  Ms. Surber verbalized understanding.

## 2015-03-24 NOTE — ED Provider Notes (Signed)
CSN: [id number]     Arrival date & time 03/24/15  1743 History   First MD Initiated Contact with Patient 03/24/15 1838     Chief Complaint  Patient presents with  . Cancer  . Vaginal Bleeding  . Chills  . Back Pain     (Consider location/radiation/quality/duration/timing/severity/associated sxs/prior Treatment) HPI Comments: Pam Vazquez is a 69 y.o. female with a PMHx of nephrolithiasis, HTN, endometrial cancer s/p chemo and radiation, DM2 (diet controlled), and glaucoma, and a PSHx of total hysterectomy and b/l salpingo-oophorectomy and laparascopic diverted colostomy, who presents to the ED with complaints of chills and sweats, and bright red vaginal bleeding that began today. She reports that the vaginal bleeding was scant, filling only 1 pad's worth. No clots or tissue passage. Additionally she reports increased urinary frequency today and fatigue. She has chronic rectal spasms and mucus from her rectum which is unchanged. Additionally she states that she has low back pain which is chronic but worsened last night and around 3 PM became even more intense, stating that it is 4/10 low back and right-sided back pain, constant, throbbing, nonradiating, improved with her fentanyl patches and Aleve although she missed her last dose of fentanyl, and with no known aggravating factors. She had Doxil chemotherapy on 03-22-15 and she states that this usually makes her feel very unwell afterwards. She called her oncologist's office (Dr. [name]) and they advised her to come here. She has not had any documented fevers, at home and here it was 31F oral. She reports that the vaginal bleeding has since gone away, but the other symptoms are ongoing. She denies any chest pain or shortness breath, abdominal pain, nausea, vomiting, diarrhea, constipation, melena, hematochezia, obstipation, dysuria, vaginal discharge or itching, dizziness, lightheadedness, numbness, tingling, weakness, rashes, URI symptoms, cough,  headache, vision changes, sick contacts, recent travel, or recent trauma. Denies any cauda equina symptoms.    ONCOLOGIC HISTORY Patient had robotic assisted total laparoscopic hysterectomy bilateral salpingo-oophorectomy right pelvic lymph node biopsy on 12/19/2011. 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 There was no involvement of other organs lymphovascular invasion was not identified. 3 lymph nodes were negative for metastatic disease. 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  Patient was seen by Dr. [name] on 04/29/2013 with a nodule in the vagina concerning for recurrence as well as 8 cm rectal mass. CT of the abdomen and pelvis showed a lobular mass at the vagina cuff measuring 7.0 x 3.2 cm proximally 4 cm craniocaudal dimension, with compression of the distal aspect of the sigmoid colon just above the rectum . There was also noted to be a large necrotic lymph node measuring 2.8 cm within the sigmoid mesocolon centrally. There are also small bilateral external iliac lymph nodes. There were no aggressive osseous lesions. 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  Patient received 6 cycles of taxol carboplatin from 06/03/2013 thru 40/98//1191, complicated by some peripheral neuropathy. There was an interval decrease in tumor burden therefore Dr. [name] recommended 3 additional cycles of Taxotere/Carboplatin, received from 11/27/13 through 01/15/14.  CT of the chest, abdomen, and pelvis on 02/10/14 demonstrated disease progression. She had radiation by Dr Sondra Come, [place] given from 02/23/14 thru 03-27-14. PET September 2015 had increased uptake in central pelvis involving the  sigmoid colon,  with rectovaginal mass confirmed on exam by gyn oncology. Patient declined total pelvic exenteration. Colonoscopy 09-08-14 confirmed near complete rectal obstruction from external mass. She had diverting colostomy by Dr [name] 09-24-14, no disease visualized outside of pelvis. She had 4 cycles of doxil from 11-03-14 thru 01-26-15, tolerated well. CT AP 02-15-15 had no disease outside of pelvis and some increase in the involved areas at vaginal cuff and right iliacus compared with scans from July and Sept 2015.   Patient is a 69 y.o. female presenting with vaginal bleeding and back pain. The history is provided by the patient and medical records. No language interpreter was used.  Vaginal Bleeding Quality:  Bright red Severity:  Mild Onset quality:  Sudden Duration:  1 day Timing:  Sporadic Progression:  Resolved Chronicity:  New Number of pads used:  1 Possible pregnancy: no   Context: spontaneously   Relieved by:  None tried Worsened by:  Nothing tried Ineffective treatments:  None tried Associated symptoms: back pain and fatigue   Associated symptoms: no abdominal pain, no dysuria, no fever, no nausea and no vaginal discharge   Risk factors: gynecological surgery   Back Pain Associated symptoms: no abdominal pain, no chest pain, no dysuria, no fever, no headaches, no numbness and no weakness     Past Medical History  Diagnosis Date  . Angina     stress test on chart from 3/12/ none since March 2012  . Chronic kidney disease     kidney stones  . Hypertension     / EKG 1/13 Epic  . Radiation 02/23/14-03/27/14    45 gray to pelvic mass  . Glaucoma   . Cancer     endometrial- done with chemo and radiation  . Diabetes mellitus     no meds now   Past Surgical History  Procedure Laterality Date  . Hysteroscopy w/d&c  11/21/2011    Procedure: DILATATION AND CURETTAGE /HYSTEROSCOPY;  Surgeon: Gus Height;  Location: [facility];  Service: Gynecology;  Laterality: N/A;  . Tubal ligation    .  Breast mass removal  1984, 1992    L breast  . Lithotripsy  2000, 2002  . Back surgery      91 and 2001  . Dilation and curettage of uterus    . Node dissection  12/19/2011    Procedure: NODE DISSECTION;  Surgeon: [name], MD PHD;  Location: [facility];  Service: Gynecology;  Laterality: N/A;  . Robotic assisted total hysterectomy with bilateral salpingo oopherectomy  12/19/2011    robotic-assisted total laparoscopic hysterectomy bilateral salpingo-oophorectomy right pelvic lymph node biopsy on 12/19/2011 h  . Laparoscopic diverted colostomy N/A 09/23/2014    Procedure: LAPAROSCOPIC END COLOSTOMY MUCOUS FISTULA, BIOPSY OF VAGINAL TUMOR;  Surgeon: Alphonsa Overall, MD;  Location: [facility];  Service: General;  Laterality: N/A;   Family History  Problem Relation Age of Onset  . Heart disease Mother   . Heart attack Mother   . Diabetes Mother   . Stomach cancer Maternal Grandmother   . Colon cancer Neg Hx   . Esophageal cancer Neg Hx   . Rectal cancer Neg Hx    History  Substance Use Topics  . Smoking status: Former Smoker -- 0.50 packs/day for 8 years    Quit date: 12/14/1985  . Smokeless tobacco: Never Used  . Alcohol Use: No   OB History    No data available     Review of Systems  Constitutional: Positive for fatigue.  Negative for fever and chills.  HENT: Negative for rhinorrhea and sore throat.   Eyes: Negative for visual disturbance.  Respiratory: Negative for cough and shortness of breath.   Cardiovascular: Negative for chest pain.  Gastrointestinal: Negative for nausea, vomiting, abdominal pain, diarrhea, constipation and blood in stool.  Genitourinary: Positive for frequency and vaginal bleeding. Negative for dysuria, hematuria, flank pain, vaginal discharge and vaginal pain.  Musculoskeletal: Positive for back pain. Negative for myalgias and arthralgias.  Skin: Negative for color change and rash.  Allergic/Immunologic: Positive for immunocompromised state (on chemo).   Neurological: Negative for syncope, weakness, light-headedness, numbness and headaches.  Psychiatric/Behavioral: Negative for confusion.   10 Systems reviewed and are negative for acute change except as noted in the HPI.    Allergies  Review of patient's allergies indicates no known allergies.  Home Medications   Prior to Admission medications   Medication Sig Start Date End Date Taking? Authorizing Provider  acetaminophen (TYLENOL) 500 MG tablet Take 500 mg by mouth every 6 (six) hours as needed for mild pain or headache.     Historical Provider, MD  amLODipine (NORVASC) 2.5 MG tablet Take 2.5 mg by mouth every morning.     Historical Provider, MD  Cholecalciferol (VITAMIN D-3) 5000 UNITS TABS Take 1 tablet by mouth every morning.     Historical Provider, MD  cyanocobalamin 2000 MCG tablet Take 2,000 mcg by mouth daily.     Historical Provider, MD  docusate sodium (COLACE) 100 MG capsule Take 100 mg by mouth 2 (two) times daily as needed for mild constipation.     Historical Provider, MD  fentaNYL (DURAGESIC - DOSED MCG/HR) 75 MCG/HR Use 75 mcg patch in addition to 100 mcg for a total dose of 175 mcg every 72 hours. 03/15/15   Lennis Marion Downer, MD  fentaNYL (DURAGESIC) 100 MCG/HR Use 100 mcg patch with 75 mcg patch for a total dose of 175 mcg every 72 hrs for pain 03/15/15   Lennis Marion Downer, MD  ferrous fumarate (HEMOCYTE - 106 MG FE) 325 (106 FE) MG TABS tablet Take 1 tablet (106 mg of iron total) by mouth daily. Take on empty stomach with OJ 10/22/14   Lennis P Livesay, MD  gabapentin (NEURONTIN) 100 MG capsule TAKE TWO CAPSULES THREE TIMES DAILY 03/15/15   Lennis Marion Downer, MD  lidocaine-prilocaine (EMLA) cream Apply topically as needed. 05/20/13   Consuela Mimes, MD  loratadine (CLARITIN) 10 MG tablet Take 10 mg by mouth daily as needed for allergies. Take as needed    Historical Provider, MD  LORazepam (ATIVAN) 0.5 MG tablet 1 tablet under tongue or by mouth every 6 hr as needed foe nausea.  Will make drowsy Patient not taking: Reported on 11/16/2014 10/22/14   Gordy Levan, MD  megestrol (MEGACE) 400 MG/10ML suspension Take 10 mLs (400 mg total) by mouth 2 (two) times daily. 11/16/14   Lennis Marion Downer, MD  metoprolol (LOPRESSOR) 50 MG tablet Take 50 mg by mouth every morning.     Historical Provider, MD  mirtazapine (REMERON) 15 MG tablet Take 15-30 mg by mouth at bedtime.    Historical Provider, MD  Multiple Vitamin (MULTIVITAMIN WITH MINERALS) TABS Take 1 tablet by mouth every morning. She takes Dance movement psychotherapist.    Historical Provider, MD  naproxen sodium (ANAPROX) 220 MG tablet Take 220 mg by mouth 2 (two) times daily with a meal.    Historical Provider, MD  ondansetron (ZOFRAN) 8 MG tablet Take 1 tablet (8  mg total) by mouth every 8 (eight) hours as needed for nausea or vomiting. Will not make drowsy Patient not taking: Reported on 11/16/2014 10/22/14   Gordy Levan, MD  Oxycodone HCl 10 MG TABS 1-2 tablets every 3-4 hours prn pain Patient not taking: Reported on 03/15/2015 11/16/14   Lennis Marion Downer, MD  polyethylene glycol (MIRALAX / GLYCOLAX) packet Take 17 g by mouth 3 (three) times daily as needed for mild constipation or moderate constipation.     Historical Provider, MD   BP 147/92 mmHg  Pulse 93  Temp(Src) 98 F (36.7 C) (Oral)  Resp 18  SpO2 99% Physical Exam  Constitutional: She is oriented to person, place, and time. Vital signs are normal. She appears well-developed.  Non-toxic appearance. No distress.  Afebrile, nontoxic, NAD, thin and frail, covered with blankets  HENT:  Head: Normocephalic and atraumatic.  Mouth/Throat: Oropharynx is clear and moist and mucous membranes are normal.  Eyes: Conjunctivae and EOM are normal. Right eye exhibits no discharge. Left eye exhibits no discharge.  Neck: Normal range of motion. Neck supple.  Cardiovascular: Normal rate, regular rhythm, normal heart sounds and intact distal pulses.  Exam reveals no gallop and no friction  rub.   No murmur heard. Pulmonary/Chest: Effort normal and breath sounds normal. No respiratory distress. She has no decreased breath sounds. She has no wheezes. She has no rhonchi. She has no rales.  Abdominal: Soft. Normal appearance and bowel sounds are normal. She exhibits no distension. There is no tenderness. There is no rigidity, no rebound, no guarding, no CVA tenderness, no tenderness at McBurney's point and negative Murphy's sign.  Colostomy present in LLQ with air in bag and some soft liquid stool. Soft, NTND, +BS throughout, no r/g/r, neg murphy's, neg mcburney's, no CVA TTP   Musculoskeletal: Normal range of motion.  MAE x4 Strength and sensation grossly intact Distal pulses intact  Neurological: She is alert and oriented to person, place, and time. She has normal strength. No sensory deficit.  Skin: Skin is warm, dry and intact. No rash noted.  Port over R chest without overlying erythema or warmth, no skin changes  Psychiatric: She has a normal mood and affect.  Nursing note and vitals reviewed.   ED Course  Procedures (including critical care time) Labs Review Labs Reviewed  URINALYSIS, ROUTINE W REFLEX MICROSCOPIC - Abnormal; Notable for the following:    APPearance CLOUDY (*)    Hgb urine dipstick MODERATE (*)    Leukocytes, UA MODERATE (*)    All other components within normal limits  CBC WITH DIFFERENTIAL/PLATELET - Abnormal; Notable for the following:    WBC 17.8 (*)    RBC 3.31 (*)    Hemoglobin 9.6 (*)    HCT 30.7 (*)    RDW 16.6 (*)    Neutrophils Relative % 82 (*)    Neutro Abs 14.7 (*)    Lymphocytes Relative 10 (*)    Monocytes Absolute 1.4 (*)    All other components within normal limits  COMPREHENSIVE METABOLIC PANEL - Abnormal; Notable for the following:    Potassium 3.3 (*)    Glucose, Bld 104 (*)    Albumin 3.2 (*)    All other components within normal limits  URINE CULTURE  LIPASE, BLOOD  URINE MICROSCOPIC-ADD ON    Imaging Review Ct  Abdomen Pelvis W Contrast  03/24/2015   CLINICAL DATA:  Ongoing chemotherapy.  Vaginal bleeding.  EXAM: CT ABDOMEN AND PELVIS WITH CONTRAST  TECHNIQUE: Multidetector  CT imaging of the abdomen and pelvis was performed using the standard protocol following bolus administration of intravenous contrast.  CONTRAST:  151mL OMNIPAQUE IOHEXOL 300 MG/ML SOLN, 37mL OMNIPAQUE IOHEXOL 300 MG/ML SOLN  COMPARISON:  02/15/2015  FINDINGS: Lower chest:  Mild distal esophageal wall thickening.  Hepatobiliary: Unremarkable  Pancreas: Stable 8 mm fluid density lesion along the pancreatic head. Similar to multiple prior exams.  Spleen: Unremarkable  Adrenals/Urinary Tract: Scatter renal scarring and renal cysts. The right kidney is particularly scarred. Mild wall thickening in the urinary bladder.  Stomach/Bowel: Left colostomy. Possible left enterostomy as well although this may simply be a hernia containing small bowel. This is similar to prior. Wall thickening in the rectal pouch diffusely, similar to prior, with perirectal stranding. Ill definition of sigmoid colon in the vicinity of the central pelvic mass, with mucosal enhancement and poor definition of the walls.  Vascular/Lymphatic: Aortoiliac atherosclerotic vascular disease. Index enhancing mass in the right psoas muscle, 3.2 by 2.5 cm, formerly 3.2 by 2.1 cm.  Reproductive: The irregularly enhancing central pelvic mass measures 10.8 cm craniocaudad, with centrally necrotic portions, previously measuring 10.7 cm craniocaudad. At the level of the vaginal cuff, the mass measures 5.8 by 9.0 cm, previously 5.7 by 9.0 cm by my measurements. This is continuous with the vaginal lumen. In addition, there is masslike enhancement along the left side of the introitus/proximal vagina, 2.6 by 1.9 cm on image 78 of series 2, formerly approximately 2.2 by 1.4 cm by my measurements.  Other: No supplemental non-categorized findings.  Musculoskeletal: Posterolateral rod and pedicle screw  fixation at L4-5. Grade 1 anterolisthesis at L3-4 -cannot exclude pars defects at L3.  IMPRESSION: 1. Overall fairly similar appearance to 1 month ago. 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 The metastatic lesion to the right psoas muscle appears slightly larger today. The central pelvic mass is inseparable from the upper portion of the vagina, and there is an enhancing lesion along the lower vagina eccentric to the left which is slightly larger than before. 2. Stable small fluid density lesion in the pancreatic head, not really changed from 2014, may merit attention on followup studies.   Electronically Signed   By: Van Clines M.D.   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 female here with chills, urinary frequency, vaginal bleeding, and back pain. Hx of endometrial cancer with extension into pelvis, just finished chemo infusion 2 days ago. Typically feels bad after chemo. On exam, no abd tenderness, no focal back tenderness, extremities neurovascularly intact with good strength and sensation. Will proceed with CT imaging to eval for mets or enlarged masses, will get basic labs, and give fluids and pain meds. Will get U/A. Will reassess shortly.   8:55 PM CBC with diff showing worsened leukocytosis from baseline which could be from chemo but could indicate infectious source. U/A with moderate leuks, 21-50 WBC, no squamous reported, but rare bacteria- unclear if this could be the source, not a very convincing specimen but will give rocephin to cover for UTI/pyelo. Will send for culture. H/H stable. CMP showing mildly low K, will replete orally at discharge.  Lipase  WNL. Awaiting CT.  11:20 PM Feeling better. CT largely unchanged, very minute differences in sizes of the masses in the R psoas and pelvis/upper vagina. Pt feels improved and feels safe going home and using oral pain meds, and abx for UTI. Will contact oncology prior to d/c to ensure no further recommendations.  11:59 PM Dr. Alvy Bimler stating CT seems stable, doubts need for admission. Advised that she contact Dr. [name] tomorrow but feels she is safe to d/c home with oral abx and pain meds at home. Discussed staying hydrated. Will also give kdur script. I explained the diagnosis and have given explicit precautions to return to the ER including for any other new or worsening symptoms. The patient understands and accepts the medical plan as it's been dictated and I have answered their questions. Discharge instructions concerning home care and prescriptions have been given. The patient is STABLE and is discharged to home in good condition.  BP 132/61 mmHg  Pulse 45  Temp(Src) 97.7 F (36.5 C) (Oral)  Resp 18  SpO2 98%  Meds ordered this encounter  Medications  . fentaNYL (SUBLIMAZE) injection 50 mcg    Sig:   . sodium chloride 0.9 % bolus 1,000 mL    Sig:   . cefTRIAXone (ROCEPHIN) 1 g in dextrose 5 % 50 mL IVPB    Sig:     Order Specific Question:  Antibiotic Indication:    Answer:  UTI  . ondansetron (ZOFRAN) injection 4 mg    Sig:   . iohexol (OMNIPAQUE) 300 MG/ML solution 50 mL    Sig:   . iohexol (OMNIPAQUE) 300 MG/ML solution 100 mL    Sig:   . cephALEXin (KEFLEX) 500 MG capsule    Sig: Take 2 capsules (1,000 mg total) by mouth 2 (two) times daily. x 7 days    Dispense:  28 capsule    Refill:  0    Order Specific Question:  Supervising Provider    Answer:  MILLER, BRIAN [[id number]]  . potassium chloride SA (K-DUR,KLOR-CON) 20 MEQ tablet    Sig: Take 1 tablet (20 mEq total) by mouth daily.    Dispense:  2 tablet    Refill:  0    Order Specific Question:  Supervising  Provider    Answer:  Noemi Chapel [[id number]]     Bassy Fetterly Camprubi-Soms, PA-C 03/25/15 0008  Wandra Arthurs, MD 03/27/15 [phone]

## 2015-03-24 NOTE — Telephone Encounter (Signed)
Call transferred to [facility].  Patient request to speak with collaborative nurse.  Offered help learned she is "having sweats.  Cold then hot the next minute.  Episodes of vaginal bleeding and this time there's a little more blood.  All day going to the bathroom like contractions passing something that looks like glue maybe from my rectum".

## 2015-03-25 DIAGNOSIS — N938 Other specified abnormal uterine and vaginal bleeding: Secondary | ICD-10-CM | POA: Diagnosis not present

## 2015-03-25 MED ORDER — HEPARIN SOD (PORK) LOCK FLUSH 100 UNIT/ML IV SOLN
500.0000 [IU] | Freq: Once | INTRAVENOUS | Status: AC
  Administered 2015-03-25: 500 [IU]
  Filled 2015-03-25: qty 5

## 2015-03-25 MED ORDER — POTASSIUM CHLORIDE CRYS ER 20 MEQ PO TBCR: 20.0000 meq | EXTENDED_RELEASE_TABLET | Freq: Every day | ORAL | Status: DC

## 2015-03-25 MED ORDER — CEPHALEXIN 500 MG PO CAPS: 1000.0000 mg | ORAL_CAPSULE | Freq: Two times a day (BID) | ORAL | Status: DC

## 2015-03-25 NOTE — ED Notes (Signed)
Pt ambulating independently w/ steady gait on d/c in no acute distress, A&Ox4. D/c instructions reviewed w/ pt and family - pt and family deny any further questions or concerns at present. Rx given x2  

## 2015-03-25 NOTE — Discharge Instructions (Signed)
Stay very well hydrated with plenty of water throughout the day. Take antibiotic until completed. Use your home pain medications for pain. Take Kdur as directed for your low potassium. Follow up with your oncologist tomorrow for recheck of ongoing symptoms but return to ER for emergent changing or worsening of symptoms. Please seek immediate care if you develop the following: You develop back pain.  Your symptoms are no better, or worse in 3 days. There is severe back pain or lower abdominal pain.  You develop chills.  You have a fever.  There is nausea or vomiting.  There is continued burning or discomfort with urination.    Urinary Tract Infection Urinary tract infections (UTIs) can develop anywhere along your urinary tract. Your urinary tract is your body's drainage system for removing wastes and extra water. Your urinary tract includes two kidneys, two ureters, a bladder, and a urethra. Your kidneys are a pair of bean-shaped organs. Each kidney is about the size of your fist. They are located below your ribs, one on each side of your spine. CAUSES Infections are caused by microbes, which are microscopic organisms, including fungi, viruses, and bacteria. These organisms are so small that they can only be seen through a microscope. Bacteria are the microbes that most commonly cause UTIs. SYMPTOMS  Symptoms of UTIs may vary by age and gender of the patient and by the location of the infection. Symptoms in young women typically include a frequent and intense urge to urinate and a painful, burning feeling in the bladder or urethra during urination. Older women and men are more likely to be tired, shaky, and weak and have muscle aches and abdominal pain. A fever may mean the infection is in your kidneys. Other symptoms of a kidney infection include pain in your back or sides below the ribs, nausea, and vomiting. DIAGNOSIS To diagnose a UTI, your caregiver will ask you about your symptoms. Your  caregiver also will ask to provide a urine sample. The urine sample will be tested for bacteria and white blood cells. White blood cells are made by your body to help fight infection. TREATMENT  Typically, UTIs can be treated with medication. Because most UTIs are caused by a bacterial infection, they usually can be treated with the use of antibiotics. The choice of antibiotic and length of treatment depend on your symptoms and the type of bacteria causing your infection. HOME CARE INSTRUCTIONS  If you were prescribed antibiotics, take them exactly as your caregiver instructs you. Finish the medication even if you feel better after you have only taken some of the medication.  Drink enough water and fluids to keep your urine clear or pale yellow.  Avoid caffeine, tea, and carbonated beverages. They tend to irritate your bladder.  Empty your bladder often. Avoid holding urine for long periods of time.  Empty your bladder before and after sexual intercourse.  After a bowel movement, women should cleanse from front to back. Use each tissue only once. SEEK MEDICAL CARE IF:   You have back pain.  You develop a fever.  Your symptoms do not begin to resolve within 3 days. SEEK IMMEDIATE MEDICAL CARE IF:   You have severe back pain or lower abdominal pain.  You develop chills.  You have nausea or vomiting.  You have continued burning or discomfort with urination. MAKE SURE YOU:   Understand these instructions.  Will watch your condition.  Will get help right away if you are not doing well or get  worse. Document Released: 08/09/2005 Document Revised: 04/30/2012 Document Reviewed: 12/08/2011 [facility] Patient Information 2015 [place], [place]. This information is not intended to replace advice given to you by your health care provider. Make sure you discuss any questions you have with your health care provider.   Metastatic Cancer, Questions and Answers KEY POINTS  Cancer happens  when cells become abnormal and grow without control.  Where the cancer started is called the primary cancer or the primary tumor.  Metastatic cancer happens when cancer cells spread from the place where it started to other parts of the body.  When cancer spreads, the metastatic cancer keeps the same type of cells and the same name as the primary tumor.  The most common sites of metastasis are the lungs, bones, liver, and brain.  Treatment for metastatic cancer usually depends on the type of cancer. It also depends on the size and location of the metastasis. WHAT IS CANCER?   Cancer is a group of many related diseases. All cancers begin in cells. Cells are the building blocks that make up tissues. Cancer that arises from organs and solid tissues is called a solid tumor. Cancer that begins in blood cells is called leukemia, multiple myeloma, or lymphoma.  Normally, cells grow and divide to form new cells as the body needs them. When cells grow old and die, new cells take their place. Sometimes this orderly process goes wrong. New cells form when the body does not need them. Old cells do not die when they should.  The extra cells form a mass of tissue. This is called a growth or tumor. Tumors can be either not cancerous (benign) or cancerous (malignant). Benign tumors do not spread to other parts of the body. They are rarely a threat to life. Malignant tumors can spread (metastasize) and may be life threatening. WHAT IS PRIMARY CANCER?  Cancer can begin in any organ or tissue of the body. The original tumor is called the primary cancer or primary tumor. It is usually named for the part of the body or the type of cell in which it begins. WHAT IS METASTASIS, AND HOW DOES IT HAPPEN?   Metastasis means the spread of cancer. Cancer cells can break away from a primary tumor and enter the bloodstream or lymphatic system. This is the system that produces, stores, and carries the cells that fight  infections. That is how cancer cells spread to other parts of the body.  When cancer cells spread and form a new tumor in a different organ, the new tumor is a metastatic tumor. The cells in the metastatic tumor come from the original tumor. For example, if breast cancer spreads to the lungs, the metastatic tumor in the lung is made up of cancerous breast cells. It is not made of lung cells. In this case, the disease in the lungs is metastatic breast cancer (not lung cancer). Under a microscope, metastatic breast cancer cells generally look the same as the cancer cells in the breast. [place]?   Cancer cells can spread to almost any part of the body. Cancer cells frequently spread to lymph nodes (rounded masses of lymphatic tissue) near the primary tumor (regional lymph nodes). This is called lymph node involvement or regional disease. Cancer that spreads to other organs or to lymph nodes far from the primary tumor is called metastatic disease. Caregivers sometimes also call this distant disease.  The most common sites of metastasis from solid tumors are the lungs, bones, liver,  and brain. Some cancers tend to spread to certain parts of the body. For example, lung cancer often metastasizes to the brain or bones. Colon cancer often spreads to the liver. Prostate cancer tends to spread to the bones. Breast cancer commonly spreads to the bones, lungs, liver, or brain. But each of these cancers can spread to other parts of the body as well.  Because blood cells travel throughout the body, leukemia, multiple myeloma, and lymphoma cells are usually not localized when the cancer is diagnosed. Tumor cells may be found in the blood, several lymph nodes, or other parts of the body such as the liver or bones. This type of spread is not referred to as metastasis. ARE THERE SYMPTOMS OF METASTATIC CANCER?   Some people with metastatic cancer do not have symptoms. Their metastases are found by X-rays  and other tests performed for other reasons.  When symptoms of metastatic cancer occur, the type and frequency of the symptoms will depend on the size and location of the metastasis. For example, cancer that spreads to the bones is likely to cause pain and can lead to bone fractures. Cancer that spreads to the brain can cause a variety of symptoms. These include headaches, seizures, and unsteadiness. Shortness of breath may be a sign of lung involvement. Abdominal swelling or yellowing of the skin (jaundice) can indicate that cancer has spread to the liver.  Sometimes a person's primary cancer is discovered only after the metastatic tumor causes symptoms. For example, a man whose prostate cancer has spread to the bones in his pelvis may have lower back pain (caused by the cancer in his bones) before he experiences any symptoms from the primary tumor in his prostate. HOW DOES THE CAREGIVER KNOW WHETHER A CANCER IS PRIMARY OR A METASTATIC TUMOR?  To determine whether a tumor is primary or metastatic, the tumor will be examined under a microscope. In general, cancer cells look like abnormal versions of cells in the tissue where the cancer began. Using specialized diagnostic tests, a trained person is often able to tell where the cancer cells came from. Markers or antigens found in or on the cancer cells can indicate the primary site of the cancer.  Metastatic cancers may be found before or at the same time as the primary tumor, or months or years later. When a new tumor is found in a patient who has been treated for cancer in the past, it is more often a metastasis than another primary tumor. IS IT POSSIBLE TO HAVE A METASTATIC TUMOR WITHOUT HAVING A PRIMARY CANCER?  No. A metastatic tumor always starts from cancer cells in another part of the body. In most cases, when a metastatic tumor is found first, the primary tumor can be found. The search for the primary tumor may involve lab tests, X-rays, and other  procedures. However, in a small number of cases, a metastatic tumor is diagnosed but the primary tumor cannot be found, in spite of extensive tests. The tumor is metastatic because the cells are not like those in the organ or tissue in which the tumor is found. The primary tumor is called unknown or hidden (occult). The patient is said to have cancer of unknown primary origin (CUP). Because diagnostic techniques are constantly improving, the number of cases of CUP is going down.  WHAT TREATMENTS ARE USED FOR METASTATIC CANCER?   When cancer has metastasized, it may be treated with:  Chemotherapy.  Radiation therapy.  Biological therapy.  Hormone therapy.  Surgery.  Cryosurgery.  A combination of these.  The choice of treatment generally depends on the:  Type of primary cancer.  Size and location of the metastasis.  Patient's age and general health.  Types of treatments the patient has had in the past. In patients with CUP, it is possible to treat the disease even though the primary tumor has not been located. The goal of treatment may be to control the cancer, or to relieve symptoms or side effects of treatment. ARE NEW TREATMENTS FOR METASTATIC CANCER BEING DEVELOPED?  Yes, many new cancer treatments are under study. To develop new treatments, the [place] sponsors clinical trials (research studies) with cancer patients in many hospitals, universities, medical schools, and cancer centers around the country. Clinical trials are a critical step in the improvement of treatment. Before any new treatment can be recommended for general use, doctors conduct studies to find out whether the treatment is both safe for patients and effective against the disease. The results of such studies have led to progress not only in the treatment of cancer, but in the detection, diagnosis, and prevention of the disease as well. Patients interested in taking part in a clinical trial should talk with their  caregivers. [place] ([employer]): www.cancer.gov Document Released: 03/06/2005 Document Revised: 01/22/2012 Document Reviewed: 10/22/2008 [facility] Patient Information 2015 [place], [place]. This information is not intended to replace advice given to you by your health care provider. Make sure you discuss any questions you have with your health care provider.  Hypokalemia Hypokalemia means that the amount of potassium in the blood is lower than normal.Potassium is a chemical, called an electrolyte, that helps regulate the amount of fluid in the body. It also stimulates muscle contraction and helps nerves function properly.Most of the body's potassium is inside of cells, and only a very small amount is in the blood. Because the amount in the blood is so small, minor changes can be life-threatening. CAUSES  Antibiotics.  Diarrhea or vomiting.  Using laxatives too much, which can cause diarrhea.  Chronic kidney disease.  Water pills (diuretics).  Eating disorders (bulimia).  Low magnesium level.  Sweating a lot. SIGNS AND SYMPTOMS  Weakness.  Constipation.  Fatigue.  Muscle cramps.  Mental confusion.  Skipped heartbeats or irregular heartbeat (palpitations).  Tingling or numbness. DIAGNOSIS  Your health care provider can diagnose hypokalemia with blood tests. In addition to checking your potassium level, your health care provider may also check other lab tests. TREATMENT Hypokalemia can be treated with potassium supplements taken by mouth or adjustments in your current medicines. If your potassium level is very low, you may need to get potassium through a vein (IV) and be monitored in the hospital. A diet high in potassium is also helpful. Foods high in potassium are:  Nuts, such as peanuts and pistachios.  Seeds, such as sunflower seeds and pumpkin seeds.  Peas, lentils, and lima beans.  Whole grain and bran cereals and breads.  Fresh  fruit and vegetables, such as apricots, avocado, bananas, cantaloupe, kiwi, oranges, tomatoes, asparagus, and potatoes.  Orange and tomato juices.  Red meats.  Fruit yogurt. HOME CARE INSTRUCTIONS  Take all medicines as prescribed by your health care provider.  Maintain a healthy diet by including nutritious food, such as fruits, vegetables, nuts, whole grains, and lean meats.  If you are taking a laxative, be sure to follow the directions on the label. SEEK MEDICAL CARE IF:  Your weakness gets worse.  You  feel your heart pounding or racing.  You are vomiting or having diarrhea.  You are diabetic and having trouble keeping your blood glucose in the normal range. SEEK IMMEDIATE MEDICAL CARE IF:  You have chest pain, shortness of breath, or dizziness.  You are vomiting or having diarrhea for more than 2 days.  You faint. MAKE SURE YOU:   Understand these instructions.  Will watch your condition.  Will get help right away if you are not doing well or get worse. Document Released: 10/30/2005 Document Revised: 08/20/2013 Document Reviewed: 05/02/2013 [facility] Patient Information 2015 [place], [place]. This information is not intended to replace advice given to you by your health care provider. Make sure you discuss any questions you have with your health care provider.  Potassium Content of Foods Potassium is a mineral found in many foods and drinks. It helps keep fluids and minerals balanced in your body and affects how steadily your heart beats. Potassium also helps control your blood pressure and keep your muscles and nervous system healthy. Certain health conditions and medicines may change the balance of potassium in your body. When this happens, you can help balance your level of potassium through the foods that you do or do not eat. Your health care provider or dietitian may recommend an amount of potassium that you should have each day. The following lists of foods provide  the amount of potassium (in parentheses) per serving in each item. HIGH IN POTASSIUM  The following foods and beverages have 200 mg or more of potassium per serving:  Apricots, 2 raw or 5 dry (200 mg).  Artichoke, 1 medium (345 mg).  Avocado, raw,  each (245 mg).  Banana, 1 medium (425 mg).  Beans, lima, or baked beans, canned,  cup (280 mg).  Beans, white, canned,  cup (595 mg).  Beef roast, 3 oz (320 mg).  Beef, ground, 3 oz (270 mg).  Beets, raw or cooked,  cup (260 mg).  Bran muffin, 2 oz (300 mg).  Broccoli,  cup (230 mg).  Brussels sprouts,  cup (250 mg).  Cantaloupe,  cup (215 mg).  Cereal, 100% bran,  cup (200-400 mg).  Cheeseburger, single, fast food, 1 each (225-400 mg).  Chicken, 3 oz (220 mg).  Clams, canned, 3 oz (535 mg).  Crab, 3 oz (225 mg).  Dates, 5 each (270 mg).  Dried beans and peas,  cup (300-475 mg).  Figs, dried, 2 each (260 mg).  Fish: halibut, tuna, cod, snapper, 3 oz (480 mg).  Fish: salmon, haddock, swordfish, perch, 3 oz (300 mg).  Fish, tuna, canned 3 oz (200 mg).  [place] fries, fast food, 3 oz (470 mg).  Granola with fruit and nuts,  cup (200 mg).  Grapefruit juice,  cup (200 mg).  Greens, beet,  cup (655 mg).  Honeydew melon,  cup (200 mg).  Kale, raw, 1 cup (300 mg).  Kiwi, 1 medium (240 mg).  Kohlrabi, rutabaga, parsnips,  cup (280 mg).  Lentils,  cup (365 mg).  Mango, 1 each (325 mg).  Milk, chocolate, 1 cup (420 mg).  Milk: nonfat, low-fat, whole, buttermilk, 1 cup (350-380 mg).  Molasses, 1 Tbsp (295 mg).  Mushrooms,  cup (280) mg.  Nectarine, 1 each (275 mg).  Nuts: almonds, peanuts, hazelnuts, [place], cashew, mixed, 1 oz (200 mg).  Nuts, pistachios, 1 oz (295 mg).  Orange, 1 each (240 mg).  Orange juice,  cup (235 mg).  Papaya, medium,  fruit (390 mg).  Peanut butter, chunky, 2  Tbsp (240 mg).  Peanut butter, smooth, 2 Tbsp (210 mg).  Pear, 1 medium (200  mg).  Pomegranate, 1 whole (400 mg).  Pomegranate juice,  cup (215 mg).  Pork, 3 oz (350 mg).  Potato chips, salted, 1 oz (465 mg).  Potato, baked with skin, 1 medium (925 mg).  Potatoes, boiled,  cup (255 mg).  Potatoes, mashed,  cup (330 mg).  Prune juice,  cup (370 mg).  Prunes, 5 each (305 mg).  Pudding, chocolate,  cup (230 mg).  Pumpkin, canned,  cup (250 mg).  Raisins, seedless,  cup (270 mg).  Seeds, sunflower or pumpkin, 1 oz (240 mg).  Soy milk, 1 cup (300 mg).  Spinach,  cup (420 mg).  Spinach, canned,  cup (370 mg).  Sweet potato, baked with skin, 1 medium (450 mg).  Swiss chard,  cup (480 mg).  Tomato or vegetable juice,  cup (275 mg).  Tomato sauce or puree,  cup (400-550 mg).  Tomato, raw, 1 medium (290 mg).  Tomatoes, canned,  cup (200-300 mg).  [place], 3 oz (250 mg).  Wheat germ, 1 oz (250 mg).  Winter squash,  cup (250 mg).  Yogurt, plain or fruited, 6 oz (260-435 mg).  Zucchini,  cup (220 mg). MODERATE IN POTASSIUM The following foods and beverages have 50-200 mg of potassium per serving:  Apple, 1 each (150 mg).  Apple juice,  cup (150 mg).  Applesauce,  cup (90 mg).  Apricot nectar,  cup (140 mg).  Asparagus, small spears,  cup or 6 spears (155 mg).  Bagel, cinnamon raisin, 1 each (130 mg).  Bagel, egg or plain, 4 in., 1 each (70 mg).  Beans, green,  cup (90 mg).  Beans, yellow,  cup (190 mg).  Beer, regular, 12 oz (100 mg).  Beets, canned,  cup (125 mg).  Blackberries,  cup (115 mg).  Blueberries,  cup (60 mg).  Bread, whole wheat, 1 slice (70 mg).  Broccoli, raw,  cup (145 mg).  Cabbage,  cup (150 mg).  Carrots, cooked or raw,  cup (180 mg).  Cauliflower, raw,  cup (150 mg).  Celery, raw,  cup (155 mg).  Cereal, bran flakes, cup (120-150 mg).  Cheese, cottage,  cup (110 mg).  Cherries, 10 each (150 mg).  Chocolate, 1 oz bar (165 mg).  Coffee, brewed 6 oz (90  mg).  Corn,  cup or 1 ear (195 mg).  Cucumbers,  cup (80 mg).  Egg, large, 1 each (60 mg).  Eggplant,  cup (60 mg).  Endive, raw, cup (80 mg).  English muffin, 1 each (65 mg).  Fish, orange roughy, 3 oz (150 mg).  Frankfurter, beef or pork, 1 each (75 mg).  Fruit cocktail,  cup (115 mg).  Grape juice,  cup (170 mg).  Grapefruit,  fruit (175 mg).  Grapes,  cup (155 mg).  Greens: kale, turnip, collard,  cup (110-150 mg).  Ice cream or frozen yogurt, chocolate,  cup (175 mg).  Ice cream or frozen yogurt, vanilla,  cup (120-150 mg).  Lemons, limes, 1 each (80 mg).  Lettuce, all types, 1 cup (100 mg).  Mixed vegetables,  cup (150 mg).  Mushrooms, raw,  cup (110 mg).  Nuts: walnuts, pecans, or macadamia, 1 oz (125 mg).  Oatmeal,  cup (80 mg).  Okra,  cup (110 mg).  Onions, raw,  cup (120 mg).  Peach, 1 each (185 mg).  Peaches, canned,  cup (120 mg).  Pears, canned,  cup (120 mg).  Peas,  green, frozen,  cup (90 mg).  Peppers, green,  cup (130 mg).  Peppers, red,  cup (160 mg).  Pineapple juice,  cup (165 mg).  Pineapple, fresh or canned,  cup (100 mg).  Plums, 1 each (105 mg).  Pudding, vanilla,  cup (150 mg).  Raspberries,  cup (90 mg).  Rhubarb,  cup (115 mg).  Rice, wild,  cup (80 mg).  Shrimp, 3 oz (155 mg).  Spinach, raw, 1 cup (170 mg).  Strawberries,  cup (125 mg).  Summer squash  cup (175-200 mg).  Swiss chard, raw, 1 cup (135 mg).  Tangerines, 1 each (140 mg).  Tea, brewed, 6 oz (65 mg).  Turnips,  cup (140 mg).  Watermelon,  cup (85 mg).  Wine, red, table, 5 oz (180 mg).  Wine, white, table, 5 oz (100 mg). LOW IN POTASSIUM The following foods and beverages have less than 50 mg of potassium per serving.  Bread, white, 1 slice (30 mg).  Carbonated beverages, 12 oz (less than 5 mg).  Cheese, 1 oz (20-30 mg).  Cranberries,  cup (45 mg).  Cranberry juice cocktail,  cup (20  mg).  Fats and oils, 1 Tbsp (less than 5 mg).  Hummus, 1 Tbsp (32 mg).  Nectar: papaya, mango, or pear,  cup (35 mg).  Rice, white or brown,  cup (50 mg).  Spaghetti or macaroni,  cup cooked (30 mg).  Tortilla, flour or corn, 1 each (50 mg).  Waffle, 4 in., 1 each (50 mg).  Water chestnuts,  cup (40 mg). Document Released: 06/13/2005 Document Revised: 11/04/2013 Document Reviewed: 09/26/2013 [facility] Patient Information 2015 [place], [place]. This information is not intended to replace advice given to you by your health care provider. Make sure you discuss any questions you have with your health care provider.

## 2015-03-26 ENCOUNTER — Telehealth: Payer: Self-pay | Admitting: *Deleted

## 2015-03-26 LAB — URINE CULTURE
COLONY COUNT: NO GROWTH
Culture: NO GROWTH

## 2015-03-26 NOTE — Telephone Encounter (Signed)
-----   Message from Gordy Levan, MD sent at 03/25/2015  8:05 AM EDT ----- Seen in ED last pm for vaginal bleeding and pain, CT with some minimal changes but nothing acute and mostly stable. May be UTI, given antibiotics (Keflex x 7 days), urine cx pending.  RN please check on her by phone. Tell her I have seen the ED information and it seems likely that symptoms are UTI.  RN please check urine culture 5-13 and 5-16 until final. Patient should call if needed otherwise before next scheduled apt 6-2.   thanks

## 2015-03-26 NOTE — Telephone Encounter (Signed)
Called to check on patient as noted below by Dr. [name]. Urine culture came back negative. Per Dr. [name], patient can stop taking Keflex. Called and spoke with patient's daughter, Lynelle Smoke, who was with patient at the time. She states her mom is feeling much better and they were out doing some shopping. Patient is agreeable to stop taking Keflex. Told Tammy to please call our office if her mom has any concerns or feels that she needs to be seen sooner than 04/15/15 - Tammy agreeable to this and appreciative of the call.

## 2015-04-14 ENCOUNTER — Other Ambulatory Visit: Payer: Self-pay

## 2015-04-14 ENCOUNTER — Other Ambulatory Visit: Payer: Self-pay | Admitting: Oncology

## 2015-04-14 DIAGNOSIS — C541 Malignant neoplasm of endometrium: Secondary | ICD-10-CM

## 2015-04-15 ENCOUNTER — Ambulatory Visit (HOSPITAL_BASED_OUTPATIENT_CLINIC_OR_DEPARTMENT_OTHER): Payer: Medicare Other | Admitting: Oncology

## 2015-04-15 ENCOUNTER — Other Ambulatory Visit: Payer: Self-pay | Admitting: *Deleted

## 2015-04-15 ENCOUNTER — Telehealth: Payer: Self-pay | Admitting: Oncology

## 2015-04-15 ENCOUNTER — Encounter: Payer: Self-pay | Admitting: Oncology

## 2015-04-15 ENCOUNTER — Other Ambulatory Visit (HOSPITAL_BASED_OUTPATIENT_CLINIC_OR_DEPARTMENT_OTHER): Payer: Medicare Other

## 2015-04-15 VITALS — BP 124/60 | HR 78 | Temp 98.9°F | Resp 18 | Ht 60.0 in | Wt 122.7 lb

## 2015-04-15 DIAGNOSIS — Z95828 Presence of other vascular implants and grafts: Secondary | ICD-10-CM

## 2015-04-15 DIAGNOSIS — N200 Calculus of kidney: Secondary | ICD-10-CM

## 2015-04-15 DIAGNOSIS — D649 Anemia, unspecified: Secondary | ICD-10-CM | POA: Diagnosis not present

## 2015-04-15 DIAGNOSIS — C7989 Secondary malignant neoplasm of other specified sites: Secondary | ICD-10-CM

## 2015-04-15 DIAGNOSIS — G893 Neoplasm related pain (acute) (chronic): Secondary | ICD-10-CM

## 2015-04-15 DIAGNOSIS — T451X5A Adverse effect of antineoplastic and immunosuppressive drugs, initial encounter: Secondary | ICD-10-CM

## 2015-04-15 DIAGNOSIS — C541 Malignant neoplasm of endometrium: Secondary | ICD-10-CM | POA: Diagnosis present

## 2015-04-15 DIAGNOSIS — D6481 Anemia due to antineoplastic chemotherapy: Secondary | ICD-10-CM

## 2015-04-15 DIAGNOSIS — N939 Abnormal uterine and vaginal bleeding, unspecified: Secondary | ICD-10-CM

## 2015-04-15 DIAGNOSIS — D500 Iron deficiency anemia secondary to blood loss (chronic): Secondary | ICD-10-CM

## 2015-04-15 DIAGNOSIS — K9419 Other complications of enterostomy: Secondary | ICD-10-CM

## 2015-04-15 LAB — CBC WITH DIFFERENTIAL/PLATELET
BASO%: 1.1 % (ref 0.0–2.0)
Basophils Absolute: 0.1 10*3/uL (ref 0.0–0.1)
EOS ABS: 0.1 10*3/uL (ref 0.0–0.5)
EOS%: 1.7 % (ref 0.0–7.0)
HCT: 29.7 % — ABNORMAL LOW (ref 34.8–46.6)
HGB: 9.4 g/dL — ABNORMAL LOW (ref 11.6–15.9)
LYMPH%: 13 % — ABNORMAL LOW (ref 14.0–49.7)
MCH: 29.8 pg (ref 25.1–34.0)
MCHC: 31.7 g/dL (ref 31.5–36.0)
MCV: 93.8 fL (ref 79.5–101.0)
MONO#: 1 10*3/uL — ABNORMAL HIGH (ref 0.1–0.9)
MONO%: 13.4 % (ref 0.0–14.0)
NEUT%: 70.8 % (ref 38.4–76.8)
NEUTROS ABS: 5.4 10*3/uL (ref 1.5–6.5)
PLATELETS: 287 10*3/uL (ref 145–400)
RBC: 3.16 10*6/uL — AB (ref 3.70–5.45)
RDW: 20.5 % — AB (ref 11.2–14.5)
WBC: 7.6 10*3/uL (ref 3.9–10.3)
lymph#: 1 10*3/uL (ref 0.9–3.3)

## 2015-04-15 LAB — COMPREHENSIVE METABOLIC PANEL (CC13)
ALT: 6 U/L (ref 0–55)
AST: 20 U/L (ref 5–34)
Albumin: 3 g/dL — ABNORMAL LOW (ref 3.5–5.0)
Alkaline Phosphatase: 76 U/L (ref 40–150)
Anion Gap: 10 mEq/L (ref 3–11)
BILIRUBIN TOTAL: 0.38 mg/dL (ref 0.20–1.20)
BUN: 17.1 mg/dL (ref 7.0–26.0)
CO2: 23 meq/L (ref 22–29)
Calcium: 10 mg/dL (ref 8.4–10.4)
Chloride: 107 mEq/L (ref 98–109)
Creatinine: 1.3 mg/dL — ABNORMAL HIGH (ref 0.6–1.1)
EGFR: 42 mL/min/{1.73_m2} — ABNORMAL LOW (ref >90)
Glucose: 103 mg/dl (ref 70–140)
POTASSIUM: 4 meq/L (ref 3.5–5.1)
Sodium: 140 mEq/L (ref 136–145)
Total Protein: 7 g/dL (ref 6.4–8.3)

## 2015-04-15 MED ORDER — FENTANYL 75 MCG/HR TD PT72: MEDICATED_PATCH | TRANSDERMAL | Status: DC

## 2015-04-15 MED ORDER — FENTANYL 100 MCG/HR TD PT72: MEDICATED_PATCH | TRANSDERMAL | Status: DC

## 2015-04-15 MED ORDER — TAMOXIFEN CITRATE 20 MG PO TABS: ORAL_TABLET | ORAL | Status: DC

## 2015-04-15 NOTE — Progress Notes (Signed)
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 She had CT AP 03-24-15, a little earlier than planned as this was done thru ED due to an episode of vaginal bleeding. The CT was essentially stable compared with scan of 02-15-15.  Patient has had slight vaginal bleeding intermittently, without increased pelvic pain obviously related to this (wrong brand fentanyl patch last prescription filled, which does not adhere well). She is using aleve 2-3x daily, rarely uses oxycodone. Stools from ostomy have been much more firm and not moving as well, tho she also does not take miralax daily; she was very uncomfortable yesterday after no stool from ostomy in >24 hours, felt much better after bowels moved. She is generally eating adequately, trying some different low fiber foods and has tolerated milk well; she is still on Megace, used for appetite. She has done some gardening and is up all day at home. She had no skin problems thru all of doxil. "Some days I feel really well".  PAC in, flushed 03-22-15.  ONCOLOGIC HISTORY  Patient had robotic assisted total laparoscopic hysterectomy bilateral salpingo-oophorectomy right pelvic lymph node biopsy on 12/19/2011. 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 There was no involvement of other organs lymphovascular invasion was not identified. 3 lymph nodes were negative for metastatic disease. 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  Patient was seen  by Dr. [name] on 04/29/2013 with a nodule in the vagina concerning for recurrence as well as 8 cm rectal mass. CT of the abdomen and pelvis showed a lobular mass at the vagina cuff measuring 7.0 x 3.2 cm proximally 4 cm craniocaudal dimension, with compression of the distal aspect of the sigmoid colon just above the rectum . There was also noted to be a large necrotic lymph node measuring 2.8 cm within the sigmoid mesocolon centrally. There are also small bilateral external iliac lymph nodes. There were no aggressive osseous lesions. 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  Patient received 6 cycles of taxol carboplatin from 06/03/2013 thru 16/01//0932, complicated by some peripheral neuropathy. There was an interval decrease in tumor burden therefore Dr. [name] recommended 3 additional cycles of Taxotere/Carboplatin, received from 11/27/13 through 01/15/14.  CT of the chest, abdomen, and pelvis on 02/10/14 demonstrated disease progression. She had radiation by Dr Sondra Come, [place] given from 02/23/14 thru 03-27-14. PET September 2015 had increased uptake in central pelvis involving the sigmoid colon, with rectovaginal mass confirmed on exam by gyn oncology. Patient declined total pelvic exenteration. Colonoscopy 09-08-14 confirmed near complete rectal obstruction from external mass. She had diverting colostomy by Dr [name] 09-24-14, no disease visualized outside of pelvis. She had 4 cycles of doxil from 11-03-14 thru 01-26-15, tolerated well. CT AP 02-15-15 had no disease outside of pelvis and some increase in the involved areas at vaginal cuff and right iliacus compared with scans from July and Sept 2015. She completed 6 cycles of doxil on 03-22-15. CT AP 03-24-15 was stable compared with the month prior.  Review of systems as above, also: No fever or symptoms of infection. No other bleeding. No increased SOB. No LE swelling. No problems with  PAC. Remainder of 10 point Review of Systems negative.  Objective:  Vital signs in last 24 hours:  BP 124/60 mmHg  Pulse 78  Temp(Src) 98.9 F (37.2 C) (Oral)  Resp 18  Ht 5' (1.524 m)  Wt 122 lb 11.2 oz (55.656 kg)  BMI 23.96 kg/m2  SpO2 100%  Alert, oriented and appropriate. Ambulatory without difficulty.  Alopecia  HEENT:PERRL, sclerae not icteric. Oral mucosa moist without lesions, posterior pharynx clear.  Neck supple. No JVD.  Lymphatics:no cervical,supraclavicular, inguinal adenopathy Resp: clear to auscultation bilaterally and normal percussion bilaterally Cardio: regular rate and rhythm. No gallop. GI: soft, nontender, not distended, no organomegaly. A few bowel sounds. Ostomy with small hard stool at stoma.  Musculoskeletal/ Extremities: without pitting edema, cords, tenderness Neuro: no peripheral neuropathy. Otherwise nonfocal. Psych appropriate mood and affect Skin without rash, ecchymosis, petechiae. No hand foot erythema or desquamation. No doxil rash. Portacath-without erythema or tenderness  Lab Results:  Results for orders placed or performed in visit on 04/15/15  CBC with Differential  Result Value Ref Range   WBC 7.6 3.9 - 10.3 10e3/uL   NEUT# 5.4 1.5 - 6.5 10e3/uL   HGB 9.4 (L) 11.6 - 15.9 g/dL   HCT 29.7 (L) 34.8 - 46.6 %   Platelets 287 145 - 400 10e3/uL   MCV 93.8 79.5 - 101.0 fL   MCH 29.8 25.1 - 34.0 pg   MCHC 31.7 31.5 - 36.0 g/dL   RBC 3.16 (L) 3.70 - 5.45 10e6/uL   RDW 20.5 (H) 11.2 - 14.5 %   lymph# 1.0 0.9 - 3.3 10e3/uL   MONO# 1.0 (H) 0.1 - 0.9 10e3/uL   Eosinophils Absolute 0.1 0.0 - 0.5 10e3/uL   Basophils Absolute 0.1 0.0 - 0.1 10e3/uL   NEUT% 70.8 38.4 - 76.8 %   LYMPH% 13.0 (L) 14.0 - 49.7 %   MONO% 13.4 0.0 - 14.0 %   EOS% 1.7 0.0 - 7.0 %   BASO% 1.1 0.0 - 2.0 %  Comprehensive metabolic panel (Cmet) - [facility]  Result Value Ref Range   Sodium 140 136 - 145 mEq/L   Potassium 4.0 3.5 - 5.1 mEq/L   Chloride 107 98 - 109 mEq/L    CO2 23 22 - 29 mEq/L   Glucose 103 70 - 140 mg/dl   BUN 17.1 7.0 - 26.0 mg/dL   Creatinine 1.3 (H) 0.6 - 1.1 mg/dL   Total Bilirubin 0.38 0.20 - 1.20 mg/dL   Alkaline Phosphatase 76 40 - 150 U/L   AST 20 5 - 34 U/L   ALT 6 0 - 55 U/L   Total Protein 7.0 6.4 - 8.3 g/dL   Albumin 3.0 (L) 3.5 - 5.0 g/dL   Calcium 10.0 8.4 - 10.4 mg/dL   Anion Gap 10 3 - 11 mEq/L   EGFR 42 (L) >90 ml/min/1.73 m2     Studies/Results: CT ABDOMEN AND PELVIS WITH CONTRAST 03-24-15  COMPARISON: 02/15/2015  FINDINGS: Lower chest: Mild distal esophageal wall thickening.  Hepatobiliary: Unremarkable  Pancreas: Stable 8 mm fluid density lesion along the pancreatic head. Similar to multiple prior exams.  Spleen: Unremarkable  Adrenals/Urinary Tract: Scatter renal scarring and renal cysts. The right kidney is particularly scarred. Mild wall thickening in the urinary bladder.  Stomach/Bowel: Left colostomy. Possible left enterostomy as well although this may simply be a hernia containing  small bowel. This is similar to prior. Wall thickening in the rectal pouch diffusely, similar to prior, with perirectal stranding. Ill definition of sigmoid colon in the vicinity of the central pelvic mass, with mucosal enhancement and poor definition of the walls.  Vascular/Lymphatic: Aortoiliac atherosclerotic vascular disease. Index enhancing mass in the right psoas muscle, 3.2 by 2.5 cm, formerly 3.2 by 2.1 cm.  Reproductive: The irregularly enhancing central pelvic mass measures 10.8 cm craniocaudad, with centrally necrotic portions, previously measuring 10.7 cm craniocaudad. At the level of the vaginal cuff, the mass measures 5.8 by 9.0 cm, previously 5.7 by 9.0 cm by my measurements. This is continuous with the vaginal lumen. In addition, there is masslike enhancement along the left side of the introitus/proximal vagina, 2.6 by 1.9 cm on image 78 of series 2, formerly approximately 2.2 by 1.4  cm by my measurements.  Other: No supplemental non-categorized findings.  Musculoskeletal: Posterolateral rod and pedicle screw fixation at L4-5. Grade 1 anterolisthesis at L3-4 -cannot exclude pars defects at L3.  IMPRESSION: 1. Overall fairly similar appearance to 1 month ago. 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 The metastatic lesion to the right psoas muscle appears slightly larger today. The central pelvic mass is inseparable from the upper portion of the vagina, and there is an enhancing lesion along the lower vagina eccentric to the left which is slightly larger than before. 2. Stable small fluid density lesion in the pancreatic head, not really changed from 2014, may merit attention on followup studies.  PACs images reviewed by MD; report discussed with patient and daughter.     Medications: I have reviewed the patient's current medications. Continue same duragesic at 175 mcg every 72 hrs with prns. Increase miralax to daily. Continue oral iron. Begin tamoxifen 20 mg bid daily x 21 days, which will alternate with 3 weeks of megace at present dose (usual dose for this regimen 160 mg daily megace, however ok for the higher dose that she has used for appetite).  Prescriptions written for duragesic patches.  DISCUSSION: Scan findings essentially stable, with area seen at vagina likely causing bleeding intermittently. I have recommended that she try hormonal blockade with alternating tamoxifen and megace every 21 days. She understands that appetite may not be as good during the 3 weeks off megace, but she probably can compensate for this with carnation instant etc. She is in agreement with this plan, prescription sent to pharmacy for tamoxifen 20 mg bid, which she will begin in next day or so.  Discussed asking ostomy RN to try irrigating the ostomy and teach her to do  this is appropriate. Message to ostomy RN Maudie Flakes, will need to coordinate with room availability at [facility].  Assessment/Plan:  1.poorly differentiated endometrial carcinoma IA, grade 1 with no high risk features at surgery 12-2011, then recurrent to vagina 04-2013 and progressive in pelvis despite chemo and RT. 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 Doxil x6 cycles from 11-03-14 thru 03-22-15. CTs show disease still limited to pelvis, seems stable with doxil. Will now try alternating tamoxifen and megace every 3 weeks.  Continue pain medication with fentanyl patches 175 mcg using brands that adhere adequately. Slight intermittent vaginal bleeding as above, follow. I believe that she has had maximal RT, but can discuss again with Dr Sondra Come if more bleeding.  2.PAC in 3.multifactoria anemia: Hgb stable at  9.4, not more symptomatic. Continue oral iron and follow.  4.nephrolithiasis, left breast biopsies  5.flu vaccine done 6.advance directives in place 7.good EF by echocardiogram 07-23-14 8.ostomy not functioning as well: increase miralax to daily and possible trial of irrigation by ostomy RN.   I will see her back in 3-4 weeks with labs. Time spent 40 min including >50% counseling and coordination of care.   Gordy Levan, MD   04/15/2015, 8:26 PM

## 2015-04-15 NOTE — Telephone Encounter (Signed)
Appointments made and avs printed for patient °

## 2015-04-16 ENCOUNTER — Encounter: Payer: Self-pay | Admitting: Oncology

## 2015-04-16 DIAGNOSIS — N939 Abnormal uterine and vaginal bleeding, unspecified: Secondary | ICD-10-CM | POA: Insufficient documentation

## 2015-04-16 NOTE — Progress Notes (Signed)
[facility] END OF TREATMENT   Name: Pam Vazquez Date: April 16, 2015  MRN: [id number] DOB: Aug 17, 1946   TREATMENT DATES: 11-03-14 thru 03-22-15   REFERRING PHYSICIAN: [name]  DIAGNOSIS: poorly differentiated endometrial cancer   STAGE AT START OF TREATMENT: IV A   INTENT: control  DRUGS OR REGIMENS GIVEN: doxil  MAJOR TOXICITIES:  anemia, fatigue   REASON TREATMENT STOPPED:  Change in therapy   PERFORMANCE STATUS AT END: 1   ONGOING PROBLEMS: anemia   FOLLOW UP PLANS: change to hormonal blockade

## 2015-04-23 ENCOUNTER — Telehealth: Payer: Self-pay

## 2015-04-23 NOTE — Telephone Encounter (Signed)
-----   Message from Gordy Levan, MD sent at 04/16/2015  4:07 PM EDT ----- Pam Vazquez -  I have another outpatient with ostomy who may benefit from irrigation by ostomy RN and learning how to do it herself at home  Very sweet lady had diverting colostomy 09-2014 for recurrent gyn tumor in pelvis. She is on lots of pain meds and has much harder stools and more difficult elimination from ostomy now. She is otherwise very functional and would benefit from getting this better.  If possible, could you and my RN Barbaraann Share work out a time for this at [facility] ([facility] is glad to help figure out room availability if needed). [name]'s desk phone is [phone] [name] is ok for any day or time.  Thank you  Evlyn Clines

## 2015-04-23 NOTE — Telephone Encounter (Signed)
Pam Vazquez was out of the office until 04-23-15.  LM earlier today to follow up on In basket messaged sent to her last Friday 04-16-15. [name] Stating that she is available to see inpatients  Not out patient's.  She is aware that there is a need for an out patient ostomy clinic.  She recommends calling [name] at [facility]  [phone].

## 2015-04-23 NOTE — Telephone Encounter (Signed)
Spoke with [employer] and she has the supplies for the irrigation and can show Ms. Amerman how to do irrigation but does not have the facilities to conduct an irrigation in the store. She said to have the patient call her at the store Monday 04-26-15 and she would see what she could arrange for the patient.

## 2015-04-23 NOTE — Telephone Encounter (Signed)
Spoke with Ms. Sheehy and told her the scenario noted below.  She agreed to call Ms. Propst on Monday.  Gave her the contact information.  Told her to call Dr. Abagail Kitchens office if there there was not a resolution after discussion with Ms. Propst.

## 2015-04-29 ENCOUNTER — Ambulatory Visit (HOSPITAL_BASED_OUTPATIENT_CLINIC_OR_DEPARTMENT_OTHER): Payer: Medicare Other

## 2015-04-29 ENCOUNTER — Encounter: Payer: Self-pay | Admitting: Nurse Practitioner

## 2015-04-29 ENCOUNTER — Other Ambulatory Visit: Payer: Self-pay | Admitting: Nurse Practitioner

## 2015-04-29 ENCOUNTER — Telehealth: Payer: Self-pay | Admitting: *Deleted

## 2015-04-29 ENCOUNTER — Ambulatory Visit (HOSPITAL_BASED_OUTPATIENT_CLINIC_OR_DEPARTMENT_OTHER): Payer: Medicare Other | Admitting: Nurse Practitioner

## 2015-04-29 VITALS — BP 136/76 | HR 80 | Temp 98.2°F | Resp 18 | Wt 118.8 lb

## 2015-04-29 DIAGNOSIS — E860 Dehydration: Secondary | ICD-10-CM | POA: Diagnosis present

## 2015-04-29 DIAGNOSIS — R530 Neoplastic (malignant) related fatigue: Secondary | ICD-10-CM | POA: Diagnosis present

## 2015-04-29 DIAGNOSIS — C541 Malignant neoplasm of endometrium: Secondary | ICD-10-CM

## 2015-04-29 DIAGNOSIS — C7989 Secondary malignant neoplasm of other specified sites: Secondary | ICD-10-CM

## 2015-04-29 DIAGNOSIS — R630 Anorexia: Secondary | ICD-10-CM | POA: Diagnosis not present

## 2015-04-29 LAB — CBC WITH DIFFERENTIAL/PLATELET
BASO%: 0.5 % (ref 0.0–2.0)
Basophils Absolute: 0.1 10*3/uL (ref 0.0–0.1)
EOS%: 0.7 % (ref 0.0–7.0)
Eosinophils Absolute: 0.1 10*3/uL (ref 0.0–0.5)
HCT: 30.4 % — ABNORMAL LOW (ref 34.8–46.6)
HGB: 9.8 g/dL — ABNORMAL LOW (ref 11.6–15.9)
LYMPH%: 8.3 % — ABNORMAL LOW (ref 14.0–49.7)
MCH: 29.7 pg (ref 25.1–34.0)
MCHC: 32.2 g/dL (ref 31.5–36.0)
MCV: 92.2 fL (ref 79.5–101.0)
MONO#: 0.7 10*3/uL (ref 0.1–0.9)
MONO%: 5.1 % (ref 0.0–14.0)
NEUT#: 11.9 10*3/uL — ABNORMAL HIGH (ref 1.5–6.5)
NEUT%: 85.4 % — AB (ref 38.4–76.8)
Platelets: 287 10*3/uL (ref 145–400)
RBC: 3.3 10*6/uL — ABNORMAL LOW (ref 3.70–5.45)
RDW: 18.6 % — AB (ref 11.2–14.5)
WBC: 13.9 10*3/uL — ABNORMAL HIGH (ref 3.9–10.3)
lymph#: 1.2 10*3/uL (ref 0.9–3.3)

## 2015-04-29 LAB — COMPREHENSIVE METABOLIC PANEL (CC13)
ALBUMIN: 3.3 g/dL — AB (ref 3.5–5.0)
ALK PHOS: 101 U/L (ref 40–150)
ALT: 13 U/L (ref 0–55)
AST: 19 U/L (ref 5–34)
Anion Gap: 8 mEq/L (ref 3–11)
BUN: 11.9 mg/dL (ref 7.0–26.0)
CALCIUM: 10 mg/dL (ref 8.4–10.4)
CO2: 26 meq/L (ref 22–29)
Chloride: 108 mEq/L (ref 98–109)
Creatinine: 0.9 mg/dL (ref 0.6–1.1)
EGFR: 69 mL/min/{1.73_m2} — ABNORMAL LOW (ref >90)
GLUCOSE: 134 mg/dL (ref 70–140)
POTASSIUM: 3.6 meq/L (ref 3.5–5.1)
Sodium: 142 mEq/L (ref 136–145)
TOTAL PROTEIN: 7.3 g/dL (ref 6.4–8.3)
Total Bilirubin: 0.55 mg/dL (ref 0.20–1.20)

## 2015-04-29 MED ORDER — HEPARIN SOD (PORK) LOCK FLUSH 100 UNIT/ML IV SOLN
500.0000 [IU] | INTRAVENOUS | Status: AC | PRN
  Administered 2015-04-29: 500 [IU]
  Filled 2015-04-29: qty 5

## 2015-04-29 MED ORDER — SODIUM CHLORIDE 0.9 % IV SOLN
INTRAVENOUS | Status: DC
  Administered 2015-04-29: 14:00:00 via INTRAVENOUS

## 2015-04-29 MED ORDER — SODIUM CHLORIDE 0.9 % IJ SOLN
10.0000 mL | INTRAMUSCULAR | Status: AC | PRN
  Administered 2015-04-29: 10 mL
  Filled 2015-04-29: qty 10

## 2015-04-29 NOTE — Progress Notes (Signed)
Orders placed for labs, [facility], and IVF today per Dr. [name] and Selena Lesser, NP. RN called patient and left message to inform her to come over to [facility] now and these apts have been set up for her.

## 2015-04-29 NOTE — Patient Instructions (Signed)
Dehydration, Adult Dehydration is when you lose more fluids from the body than you take in. Vital organs like the kidneys, brain, and heart cannot function without a proper amount of fluids and salt. Any loss of fluids from the body can cause dehydration.  CAUSES   Vomiting.  Diarrhea.  Excessive sweating.  Excessive urine output.  Fever. SYMPTOMS  Mild dehydration  Thirst.  Dry lips.  Slightly dry mouth. Moderate dehydration  Very dry mouth.  Sunken eyes.  Skin does not bounce back quickly when lightly pinched and released.  Dark urine and decreased urine production.  Decreased tear production.  Headache. Severe dehydration  Very dry mouth.  Extreme thirst.  Rapid, weak pulse (more than 100 beats per minute at rest).  Cold hands and feet.  Not able to sweat in spite of heat and temperature.  Rapid breathing.  Blue lips.  Confusion and lethargy.  Difficulty being awakened.  Minimal urine production.  No tears. DIAGNOSIS  Your caregiver will diagnose dehydration based on your symptoms and your exam. Blood and urine tests will help confirm the diagnosis. The diagnostic evaluation should also identify the cause of dehydration. TREATMENT  Treatment of mild or moderate dehydration can often be done at home by increasing the amount of fluids that you drink. It is best to drink small amounts of fluid more often. Drinking too much at one time can make vomiting worse. Refer to the home care instructions below. Severe dehydration needs to be treated at the hospital where you will probably be given intravenous (IV) fluids that contain water and electrolytes. HOME CARE INSTRUCTIONS   Ask your caregiver about specific rehydration instructions.  Drink enough fluids to keep your urine clear or pale yellow.  Drink small amounts frequently if you have nausea and vomiting.  Eat as you normally do.  Avoid:  Foods or drinks high in sugar.  Carbonated  drinks.  Juice.  Extremely hot or cold fluids.  Drinks with caffeine.  Fatty, greasy foods.  Alcohol.  Tobacco.  Overeating.  Gelatin desserts.  Wash your hands well to avoid spreading bacteria and viruses.  Only take over-the-counter or prescription medicines for pain, discomfort, or fever as directed by your caregiver.  Ask your caregiver if you should continue all prescribed and over-the-counter medicines.  Keep all follow-up appointments with your caregiver. SEEK MEDICAL CARE IF:  You have abdominal pain and it increases or stays in one area (localizes).  You have a rash, stiff neck, or severe headache.  You are irritable, sleepy, or difficult to awaken.  You are weak, dizzy, or extremely thirsty. SEEK IMMEDIATE MEDICAL CARE IF:   You are unable to keep fluids down or you get worse despite treatment.  You have frequent episodes of vomiting or diarrhea.  You have blood or green matter (bile) in your vomit.  You have blood in your stool or your stool looks black and tarry.  You have not urinated in 6 to 8 hours, or you have only urinated a small amount of very dark urine.  You have a fever.  You faint. MAKE SURE YOU:   Understand these instructions.  Will watch your condition.  Will get help right away if you are not doing well or get worse. Document Released: 10/30/2005 Document Revised: 01/22/2012 Document Reviewed: 06/19/2011 ExitCare Patient Information 2015 [employer]. This information is not intended to replace advice given to you by your health care provider. Make sure you discuss any questions you have with your health care   provider.  

## 2015-04-29 NOTE — Assessment & Plan Note (Signed)
Patient is complaining of minimal appetite and poor oral intake for the past several days.  Patient is requesting IV fluid rehydration today.  Patient will receive 1 L normal saline IV fluid rehydration while the cancer Center today.  She was also encouraged to push with at home.

## 2015-04-29 NOTE — Telephone Encounter (Signed)
Called patient who reports "I have sweats and chills.  Started last night and I've been sweating all night long.  Temp this morning = 98.0.  I feel like crud.  I don't know how much liquid i drink but it was not 64 oz yesterday."  Asked if ostomy working better and denies diarrhea from ostomy.  Denies n/v, cough, congestion or urinary symptoms.  Will notify Dr. [name].  Instructed to expect a call back as she may ned [facility].  Reports "it takes at least 45 minutes to get here.  This is an emergency and has someone to dive her in that has stayed out of work for this emergency she called last night about!"

## 2015-04-29 NOTE — Telephone Encounter (Signed)
Patient seen today in [facility], received 1L IVF.

## 2015-04-29 NOTE — Assessment & Plan Note (Addendum)
Patient is complaining of increased fatigue and generalized weakness for the past several days.  She denies any specific new symptoms; denying any URI or UTI symptoms.  She also denies any nausea, vomiting, diarrhea, or constipation.  She is complaining of minimal appetite; and feels slightly dehydrated today.  She is requesting IV fluids today.  On exam.-Patient does appear fatigued, weak, and frail.  She was crawled up in a blanket on the recliner in the exam room.  She appeared nontoxic.  Patient states that she began taking tamoxifen on a daily basis approximate 17 days ago for hormonal blocking.  Advised patient that the tamoxifen could be causing some increased fatigue.  Patient was encouraged to remain as active as possible.

## 2015-04-29 NOTE — Assessment & Plan Note (Signed)
Patient is status post complete hysterectomy.  She received her last Doxil chemotherapy on 03/22/2015.  She is in the midst of her initial cycle of tamoxifen 20 mg on a daily basis for a total of 3 weeks.  She states she is taking approximately 17 days of the tamoxifen so far.  Following the initial 3 weeks of tamoxifen; patient is scheduled to initiate 3 weeks of the Megace for planned hormonal blocking.  Blood counts essentially stable with a WBC of 13.9, ANC 11.9, hemoglobin 9.8, and platelet count 287.  Patient is scheduled to return on 05/13/2015 for labs and a follow-up visit.

## 2015-04-29 NOTE — Assessment & Plan Note (Signed)
Patient is complaining of minimal appetite within the past several days; and also feels dehydrated.  Patient will receive IV fluid rehydration while cancer Center today.  She was also encouraged to eat multiple small meals throughout the day and to push protein at home.

## 2015-04-29 NOTE — Progress Notes (Signed)
SYMPTOM MANAGEMENT CLINIC   HPI: Pam Vazquez 69 y.o. 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  Patient is status post complete hysterectomy and chemotherapy which was completed on 03/22/2015.  Currently undergoing alternating tamoxifen and Megace for hormonal blocking.  Patient is complaining of increased fatigue and generalized weakness within this past week or so.  She denies any other new symptoms whatsoever.  She denies any recent fevers.  She reports minimal appetite and poor oral intake recently.  She is requesting IV fluid rehydration today.  Patient states she has taken approximately 17 days of the initial cycle of tamoxifen.   HPI  ROS  Past Medical History  Diagnosis Date  . Angina     stress test on chart from 3/12/ none since March 2012  . Chronic kidney disease     kidney stones  . Hypertension     / EKG 1/13 Epic  . Radiation 02/23/14-03/27/14    45 gray to pelvic mass  . Glaucoma   . Cancer     endometrial- done with chemo and radiation  . Diabetes mellitus     no meds now    Past Surgical History  Procedure Laterality Date  . Hysteroscopy w/d&c  11/21/2011    Procedure: DILATATION AND CURETTAGE /HYSTEROSCOPY;  Surgeon: Gus Height;  Location: [facility];  Service: Gynecology;  Laterality: N/A;  . Tubal ligation    . Breast mass removal  1984, 1992    L breast  . Lithotripsy  2000, 2002  . Back surgery      91 and 2001  . Dilation and curettage of uterus    . Node dissection  12/19/2011    Procedure: NODE DISSECTION;  Surgeon: [name], MD PHD;  Location: [facility];  Service: Gynecology;  Laterality: N/A;  . Robotic assisted total hysterectomy with bilateral salpingo oopherectomy  12/19/2011    robotic-assisted total laparoscopic hysterectomy bilateral salpingo-oophorectomy right pelvic lymph node biopsy on 12/19/2011 h  . 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    has No Known Allergies.    Medication List       This list is accurate as of: 04/29/15  2:12 PM.  Always use your most recent med list.               acetaminophen 500 MG tablet  Commonly known as:  TYLENOL  Take 500 mg by mouth every 6 (six) hours as needed for mild pain or headache.     amLODipine 2.5 MG tablet  Commonly known as:  NORVASC  Take 2.5 mg by mouth every morning.     docusate sodium 100 MG capsule  Commonly known as:  COLACE  Take 100 mg by mouth 2 (two) times daily as needed for mild constipation.     fentaNYL 100 MCG/HR  Commonly known as:  DURAGESIC  Use 100 mcg patch with 75 mcg patch for a total dose of 175  mcg every 72 hrs for pain     fentaNYL 75 MCG/HR  Commonly known as:  DURAGESIC - dosed mcg/hr  Use 75 mcg patch in addition to 100 mcg for a total dose of 175 mcg every 72 hours.     ferrous fumarate 325 (106 FE) MG Tabs tablet  Commonly known as:  HEMOCYTE - 106 mg FE  Take 1 tablet (106 mg of iron total) by mouth daily. Take on empty stomach with OJ     gabapentin 100 MG capsule  Commonly known as:  NEURONTIN  TAKE TWO CAPSULES THREE TIMES DAILY     lidocaine-prilocaine cream  Commonly known as:  EMLA  Apply topically as needed.     loratadine 10 MG tablet  Commonly known as:  CLARITIN  Take 10 mg by mouth daily as  needed for allergies. Take as needed     LORazepam 0.5 MG tablet  Commonly known as:  ATIVAN  1 tablet under tongue or by mouth every 6 hr as needed foe nausea. Will make drowsy     megestrol 400 MG/10ML suspension  Commonly known as:  MEGACE  Take 10 mLs (400 mg total) by mouth 2 (two) times daily.     metoprolol 50 MG tablet  Commonly known as:  LOPRESSOR  Take 50 mg by mouth every morning.     mirtazapine 15 MG tablet  Commonly known as:  REMERON  Take 15-30 mg by mouth at bedtime.     multivitamin with minerals Tabs tablet  Take 1 tablet by mouth every morning. She takes Dance movement psychotherapist.     ondansetron 8 MG tablet  Commonly known as:  ZOFRAN  Take 1 tablet (8 mg total) by mouth every 8 (eight) hours as needed for nausea or vomiting. Will not make drowsy     Oxycodone HCl 10 MG Tabs  1-2 tablets every 3-4 hours prn pain     polyethylene glycol packet  Commonly known as:  MIRALAX / GLYCOLAX  Take 17 g by mouth 3 (three) times daily as needed for mild constipation or moderate constipation.     potassium chloride SA 20 MEQ tablet  Commonly known as:  K-DUR,KLOR-CON  Take 1 tablet (20 mEq total) by mouth daily.     tamoxifen 20 MG tablet  Commonly known as:  NOLVADEX  Take twice a day for 21 days every 6 weeks, alternating with Megace for 21 days     Vitamin D-3 5000 UNITS Tabs  Take 1 tablet by mouth every morning.         PHYSICAL EXAMINATION  Oncology Vitals 04/29/2015 04/15/2015 03/25/2015 03/24/2015 03/24/2015 03/24/2015 03/22/2015  Height - 152 cm - - - - -  Weight 53.887 kg 55.656 kg - - - - -  Weight (lbs) 118 lbs 13 oz 122 lbs 11 oz - - - - -  BMI (kg/m2) - 23.96 kg/m2 - - - - -  Temp 98.2 98.9 - - 97.7 98 98.2  Pulse 80 78 81 45 83 93 76  Resp $Rem'18 18 16 18 18 18 18  'BwDV$ SpO2 100 100 98 98 100 99 100  BSA (m2) - 1.54 m2 - - - - -   BP Readings from Last 3 Encounters:  04/29/15 136/76  04/15/15 124/60  03/25/15 130/66    Physical Exam  Constitutional: She is  oriented to person, place, and time.  Patient appears fatigued, mildly weak, frail, and chronically ill.  HENT:  Head: Normocephalic and atraumatic.  Eyes: Conjunctivae and EOM are normal. Pupils are equal, round, and reactive to light. Right eye exhibits no discharge. Left eye exhibits no discharge. No scleral icterus.  Neck: Normal range of motion.  Cardiovascular: Normal rate, regular rhythm, normal heart sounds and intact distal pulses.   Pulmonary/Chest: Effort normal and breath sounds normal. No respiratory distress. She has no wheezes. She has no rales. She exhibits no tenderness.  Abdominal: Soft. Bowel sounds are normal. She exhibits no distension and no mass. There is no tenderness. There is no rebound and no guarding.  Musculoskeletal: Normal range of motion. She exhibits no edema or tenderness.  Neurological: She is alert and oriented to person, place, and time. Gait normal.  Skin: Skin is warm and dry. No rash noted. No erythema. There is pallor.  Psychiatric: Affect normal.  Nursing note and vitals reviewed.   LABORATORY DATA:. Appointment on 04/29/2015  Component Date Value Ref Range Status  . WBC 04/29/2015 13.9* 3.9 - 10.3 10e3/uL Final  . NEUT# 04/29/2015 11.9* 1.5 - 6.5 10e3/uL Final  . HGB 04/29/2015 9.8* 11.6 - 15.9 g/dL Final  . HCT 04/29/2015 30.4* 34.8 - 46.6 % Final  . Platelets 04/29/2015 287  145 - 400 10e3/uL Final  . MCV 04/29/2015 92.2  79.5 - 101.0 fL Final  . MCH 04/29/2015 29.7  25.1 - 34.0 pg Final  . MCHC 04/29/2015 32.2  31.5 - 36.0 g/dL Final  . RBC 04/29/2015 3.30* 3.70 - 5.45 10e6/uL Final  . RDW 04/29/2015 18.6* 11.2 - 14.5 % Final  . lymph# 04/29/2015 1.2  0.9 - 3.3 10e3/uL Final  . MONO# 04/29/2015 0.7  0.1 - 0.9 10e3/uL Final  . Eosinophils Absolute 04/29/2015 0.1  0.0 - 0.5 10e3/uL Final  . Basophils Absolute 04/29/2015 0.1  0.0 - 0.1 10e3/uL Final  . NEUT% 04/29/2015 85.4* 38.4 - 76.8 % Final  . LYMPH% 04/29/2015 8.3* 14.0 - 49.7 % Final    . MONO% 04/29/2015 5.1  0.0 - 14.0 % Final  . EOS% 04/29/2015 0.7  0.0 - 7.0 % Final  . BASO% 04/29/2015 0.5  0.0 - 2.0 % Final  . Sodium 04/29/2015 142  136 - 145 mEq/L Final  . Potassium 04/29/2015 3.6  3.5 - 5.1 mEq/L Final  . Chloride 04/29/2015 108  98 - 109 mEq/L Final  . CO2 04/29/2015 26  22 - 29 mEq/L Final  . Glucose 04/29/2015 134  70 - 140 mg/dl Final  . BUN 04/29/2015 11.9  7.0 - 26.0 mg/dL Final  . Creatinine 04/29/2015 0.9  0.6 - 1.1 mg/dL Final  . Total Bilirubin 04/29/2015 0.55  0.20 - 1.20 mg/dL Final  . Alkaline Phosphatase 04/29/2015 101  40 - 150 U/L Final  . AST 04/29/2015 19  5 - 34 U/L Final  . ALT 04/29/2015 13  0 - 55 U/L Final  . Total Protein 04/29/2015 7.3  6.4 - 8.3 g/dL Final  . Albumin 04/29/2015 3.3* 3.5 - 5.0 g/dL Final  . Calcium 04/29/2015 10.0  8.4 - 10.4 mg/dL Final  . Anion Gap 04/29/2015 8  3 - 11 mEq/L Final  . EGFR 04/29/2015 69* >90 ml/min/1.73 m2 Final   eGFR is calculated using the CKD-EPI Creatinine Equation (2009)     RADIOGRAPHIC STUDIES: No results found.  ASSESSMENT/PLAN:    Cancer of endometrium Patient is status post complete hysterectomy.  She received her last Doxil chemotherapy on 03/22/2015.  She is in the midst of her initial cycle of tamoxifen 20 mg on  a daily basis for a total of 3 weeks.  She states she is taking approximately 17 days of the tamoxifen so far.  Following the initial 3 weeks of tamoxifen; patient is scheduled to initiate 3 weeks of the Megace for planned hormonal blocking.  Blood counts essentially stable with a WBC of 13.9, ANC 11.9, hemoglobin 9.8, and platelet count 287.  Patient is scheduled to return on 05/13/2015 for labs and a follow-up visit.  Neoplastic malignant related fatigue Patient is complaining of increased fatigue and generalized weakness for the past several days.  She denies any specific new symptoms; denying any URI or UTI symptoms.  She also denies any nausea, vomiting, diarrhea, or  constipation.  She is complaining of minimal appetite; and feels slightly dehydrated today.  She is requesting IV fluids today.  On exam.-Patient does appear fatigued, weak, and frail.  She was crawled up in a blanket on the recliner in the exam room.  She appeared nontoxic.  Patient states that she began taking tamoxifen on a daily basis approximate 17 days ago for hormonal blocking.  Advised patient that the tamoxifen could be causing some increased fatigue.  Patient was encouraged to remain as active as possible.  Anorexia Patient is complaining of minimal appetite within the past several days; and also feels dehydrated.  Patient will receive IV fluid rehydration while cancer Center today.  She was also encouraged to eat multiple small meals throughout the day and to push protein at home.  Dehydration Patient is complaining of minimal appetite and poor oral intake for the past several days.  Patient is requesting IV fluid rehydration today.  Patient will receive 1 L normal saline IV fluid rehydration while the cancer Center today.  She was also encouraged to push with at home.  Patient stated understanding of all instructions; and was in agreement with this plan of care. The patient knows to call the clinic with any problems, questions or concerns.   Review/collaboration with Dr. [name] regarding all aspects of patient's visit today.   Total time spent with patient was 25 minutes;  with greater than 75 percent of that time spent in face to face counseling regarding patient's symptoms,  and coordination of care and follow up.  Disclaimer: This note was dictated with voice recognition software. Similar sounding words can inadvertently be transcribed and may not be corrected upon review.   Drue Second, NP 04/29/2015

## 2015-05-09 ENCOUNTER — Other Ambulatory Visit: Payer: Self-pay | Admitting: Oncology

## 2015-05-09 DIAGNOSIS — C541 Malignant neoplasm of endometrium: Secondary | ICD-10-CM

## 2015-05-09 DIAGNOSIS — D500 Iron deficiency anemia secondary to blood loss (chronic): Secondary | ICD-10-CM

## 2015-05-09 DIAGNOSIS — C7989 Secondary malignant neoplasm of other specified sites: Secondary | ICD-10-CM

## 2015-05-10 ENCOUNTER — Other Ambulatory Visit: Payer: Self-pay

## 2015-05-13 ENCOUNTER — Ambulatory Visit (HOSPITAL_BASED_OUTPATIENT_CLINIC_OR_DEPARTMENT_OTHER): Payer: Medicare Other | Admitting: Oncology

## 2015-05-13 ENCOUNTER — Encounter: Payer: Self-pay | Admitting: Oncology

## 2015-05-13 ENCOUNTER — Other Ambulatory Visit (HOSPITAL_BASED_OUTPATIENT_CLINIC_OR_DEPARTMENT_OTHER): Payer: Medicare Other

## 2015-05-13 VITALS — BP 146/73 | HR 69 | Temp 98.2°F | Resp 18 | Ht 60.0 in | Wt 118.7 lb

## 2015-05-13 DIAGNOSIS — C541 Malignant neoplasm of endometrium: Secondary | ICD-10-CM

## 2015-05-13 DIAGNOSIS — D649 Anemia, unspecified: Secondary | ICD-10-CM | POA: Diagnosis not present

## 2015-05-13 DIAGNOSIS — C7989 Secondary malignant neoplasm of other specified sites: Secondary | ICD-10-CM

## 2015-05-13 DIAGNOSIS — Z95828 Presence of other vascular implants and grafts: Secondary | ICD-10-CM

## 2015-05-13 DIAGNOSIS — D500 Iron deficiency anemia secondary to blood loss (chronic): Secondary | ICD-10-CM

## 2015-05-13 DIAGNOSIS — G893 Neoplasm related pain (acute) (chronic): Secondary | ICD-10-CM

## 2015-05-13 DIAGNOSIS — F5102 Adjustment insomnia: Secondary | ICD-10-CM

## 2015-05-13 DIAGNOSIS — K9419 Other complications of enterostomy: Secondary | ICD-10-CM

## 2015-05-13 LAB — CBC WITH DIFFERENTIAL/PLATELET
BASO%: 0.1 % (ref 0.0–2.0)
Basophils Absolute: 0 10*3/uL (ref 0.0–0.1)
EOS ABS: 0.2 10*3/uL (ref 0.0–0.5)
EOS%: 1.2 % (ref 0.0–7.0)
HEMATOCRIT: 30.4 % — AB (ref 34.8–46.6)
HGB: 9.4 g/dL — ABNORMAL LOW (ref 11.6–15.9)
LYMPH#: 1.1 10*3/uL (ref 0.9–3.3)
LYMPH%: 7.3 % — ABNORMAL LOW (ref 14.0–49.7)
MCH: 30.5 pg (ref 25.1–34.0)
MCHC: 30.9 g/dL — ABNORMAL LOW (ref 31.5–36.0)
MCV: 98.7 fL (ref 79.5–101.0)
MONO#: 0.8 10*3/uL (ref 0.1–0.9)
MONO%: 5.3 % (ref 0.0–14.0)
NEUT#: 12.6 10*3/uL — ABNORMAL HIGH (ref 1.5–6.5)
NEUT%: 86.1 % — AB (ref 38.4–76.8)
Platelets: 208 10*3/uL (ref 145–400)
RBC: 3.08 10*6/uL — AB (ref 3.70–5.45)
RDW: 17 % — ABNORMAL HIGH (ref 11.2–14.5)
WBC: 14.6 10*3/uL — AB (ref 3.9–10.3)

## 2015-05-13 LAB — COMPREHENSIVE METABOLIC PANEL (CC13)
ALBUMIN: 3.2 g/dL — AB (ref 3.5–5.0)
ALT: 10 U/L (ref 0–55)
AST: 18 U/L (ref 5–34)
Alkaline Phosphatase: 92 U/L (ref 40–150)
Anion Gap: 8 mEq/L (ref 3–11)
BUN: 16.8 mg/dL (ref 7.0–26.0)
CHLORIDE: 105 meq/L (ref 98–109)
CO2: 29 meq/L (ref 22–29)
CREATININE: 1.1 mg/dL (ref 0.6–1.1)
Calcium: 10.2 mg/dL (ref 8.4–10.4)
EGFR: 54 mL/min/{1.73_m2} — ABNORMAL LOW (ref >90)
Glucose: 156 mg/dl — ABNORMAL HIGH (ref 70–140)
POTASSIUM: 4.6 meq/L (ref 3.5–5.1)
Sodium: 142 mEq/L (ref 136–145)
TOTAL PROTEIN: 6.9 g/dL (ref 6.4–8.3)
Total Bilirubin: 0.41 mg/dL (ref 0.20–1.20)

## 2015-05-13 LAB — IRON AND TIBC CHCC
%SAT: 24 % (ref 21–57)
Iron: 52 ug/dL (ref 41–142)
TIBC: 218 ug/dL — ABNORMAL LOW (ref 236–444)
UIBC: 165 ug/dL (ref 120–384)

## 2015-05-13 MED ORDER — FENTANYL 100 MCG/HR TD PT72
MEDICATED_PATCH | TRANSDERMAL | Status: DC
Start: 2015-05-13 — End: 2015-06-17

## 2015-05-13 MED ORDER — FENTANYL 75 MCG/HR TD PT72: MEDICATED_PATCH | TRANSDERMAL | Status: DC

## 2015-05-13 NOTE — Progress Notes (Signed)
OFFICE PROGRESS NOTE   May 13, 2015   Physicians:David Gwenette Greet (PCP Ledell Noss), W.Brewster, J.Kinard, S.[name], Scarlette Shorts  INTERVAL HISTORY:  Patient is seen, together with daughter, in continuing attention to endometrial carcinoma recurrent in pelvis, now on tamoxifen alternating with megace every 3 weeks, to complete present tamoxifen in 3 days.   She is to meet with [name] at [facility] later today re ostomy irrigation, skin care etc (appointment rescheduled by patient). Ostomy function has been much better since using miralax regularly.  Pain control is still good on fentanyl patches 175 mcg every 72 hours; she takes aleve tid but has not needed oxycodone in several weeks with this.  Appetite is reasonable even when she is not on Megace. She is generally fatigued, getting only 4-5 hours of sleep despite gabapentin and Remeron 15 mg at hs. She is able to fall asleep, then wakens at times due to sweats/ cold and other times for unspecified reasons, then does not sleep much further. She is due back to Dr Scotty Court, who began the Remeron, and will call his office to schedule. She will let him know how she is with prn ativan during night (see below), as he may want to make other adjustments if that is not helpful enough with sleep.   Patient was seen at symptom management clinic on 04-29-15 for fatigue, possibly related to working outdoors the day prior, not drinking fluids well then. IVF that day wa helpful.   PAC in, flushed 04-29-15.  ONCOLOGIC HISTORY  Patient had robotic assisted total laparoscopic hysterectomy bilateral salpingo-oophorectomy right pelvic lymph node biopsy on 12/19/2011. 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 There was no involvement of other organs lymphovascular invasion was not identified. 3 lymph nodes were negative for metastatic disease.  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  Patient was seen by Dr. [name] on 04/29/2013 with a nodule in the vagina concerning for recurrence as well as 8 cm rectal mass. CT of the abdomen and pelvis showed a lobular mass at the vagina cuff measuring 7.0 x 3.2 cm proximally 4 cm craniocaudal dimension, with compression of the distal aspect of the sigmoid colon just above the rectum . There was also noted to be a large necrotic lymph node measuring 2.8 cm within the sigmoid mesocolon centrally. There are also small bilateral external iliac lymph nodes. There were no aggressive osseous lesions. 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  Patient received 6 cycles of taxol carboplatin from 06/03/2013 thru 71/69//6789, complicated by some peripheral neuropathy. There was an interval decrease in tumor burden therefore Dr. [name] recommended 3 additional cycles of Taxotere/Carboplatin, received from 11/27/13 through 01/15/14.  CT of the chest, abdomen, and pelvis on 02/10/14 demonstrated disease progression. She had radiation by Dr Sondra Come, [place] given from 02/23/14 thru 03-27-14. PET September 2015 had increased uptake in central pelvis involving the sigmoid colon, with rectovaginal mass confirmed on exam by gyn oncology. Patient declined total pelvic exenteration. Colonoscopy 09-08-14 confirmed near complete rectal obstruction from external mass. She had diverting colostomy by Dr [name] 09-24-14, no disease visualized outside of pelvis. She had 4 cycles of doxil from 11-03-14 thru 01-26-15, tolerated well. CT AP 02-15-15 had no disease outside of pelvis and some increase  in the involved areas at vaginal cuff and right iliacus compared with scans from July and Sept 2015. She completed 6 cycles of doxil on 03-22-15.  CT AP 03-24-15 was stable compared with the month prior.She began alternating megace with tamoxifen every 3 weeks on 04-15-15.     Review of systems as above, also: No fever, no localizing symptoms of infection. She cannot tell decrease in appetite when she is on tamoxifen as compared with Megace. More mucous drainage from the fistula below ostomy. Remainder of 10 point Review of Systems negative.  Objective:  Vital signs in last 24 hours:  BP 146/73 mmHg  Pulse 69  Temp(Src) 98.2 F (36.8 C) (Oral)  Resp 18  Ht 5' (1.524 m)  Wt 118 lb 11.2 oz (53.842 kg)  BMI 23.18 kg/m2  SpO2 100% Weight stable. Looks mostly fatigued, possibly more depressed. Alert, oriented. Ambulatory without difficulty.  No alopecia  HEENT:PERRL, sclerae not icteric. Oral mucosa moist without lesions, posterior pharynx clear.  Neck supple. No JVD.  Lymphatics:no cervical,supraclavicular or inguinal adenopathy Resp: clear to auscultation bilaterally and normal percussion bilaterally Cardio: regular rate and rhythm. No gallop. GI: soft, nontender, not distended, no mass or organomegaly. Normally active bowel sounds. Soft stool in ostomy bag. Mucous on gauze covering fistula, skin a little irritated below fistula. Musculoskeletal/ Extremities: without pitting edema, cords, tenderness Neuro: nonfocal. Psych as above Skin without rash, ecchymosis, petechiae Portacath-without erythema or tenderness  Lab Results:  Results for orders placed or performed in visit on 05/13/15  CBC with Differential  Result Value Ref Range   WBC 14.6 (H) 3.9 - 10.3 10e3/uL   NEUT# 12.6 (H) 1.5 - 6.5 10e3/uL   HGB 9.4 (L) 11.6 - 15.9 g/dL   HCT 30.4 (L) 34.8 - 46.6 %   Platelets 208 145 - 400 10e3/uL   MCV 98.7 79.5 - 101.0 fL   MCH 30.5 25.1 - 34.0 pg   MCHC 30.9 (L) 31.5 - 36.0 g/dL   RBC 3.08 (L) 3.70 - 5.45 10e6/uL   RDW 17.0 (H) 11.2 - 14.5 %   lymph# 1.1 0.9 - 3.3 10e3/uL   MONO# 0.8 0.1 - 0.9 10e3/uL   Eosinophils  Absolute 0.2 0.0 - 0.5 10e3/uL   Basophils Absolute 0.0 0.0 - 0.1 10e3/uL   NEUT% 86.1 (H) 38.4 - 76.8 %   LYMPH% 7.3 (L) 14.0 - 49.7 %   MONO% 5.3 0.0 - 14.0 %   EOS% 1.2 0.0 - 7.0 %   BASO% 0.1 0.0 - 2.0 %  Comprehensive metabolic panel (Cmet) - [facility]  Result Value Ref Range   Sodium 142 136 - 145 mEq/L   Potassium 4.6 3.5 - 5.1 mEq/L   Chloride 105 98 - 109 mEq/L   CO2 29 22 - 29 mEq/L   Glucose 156 (H) 70 - 140 mg/dl   BUN 16.8 7.0 - 26.0 mg/dL   Creatinine 1.1 0.6 - 1.1 mg/dL   Total Bilirubin 0.41 0.20 - 1.20 mg/dL   Alkaline Phosphatase 92 40 - 150 U/L   AST 18 5 - 34 U/L   ALT 10 0 - 55 U/L   Total Protein 6.9 6.4 - 8.3 g/dL   Albumin 3.2 (L) 3.5 - 5.0 g/dL   Calcium 10.2 8.4 - 10.4 mg/dL   Anion Gap 8 3 - 11 mEq/L   EGFR 54 (L) >90 ml/min/1.73 m2    Iron studies available after visit improved, with serum iron 52 and %sat 24. Studies/Results:  No results  found.  Medications: I have reviewed the patient's current medications. She will try lorazepam (ativan) 0.5 mg if she wakens during night, keep Remeron same at 15 mg at hs until she discusses with Dr Scotty Court. Continue tamoxifen alternating with Megace every 3 weeks. Prescriptions for fentanyl patches x 4 weeks.  DISCUSSION: medications as above, hopefully can get more sleep. Hemoglobin noted, but iron studies improved so will follow off of chemo for now.   Assessment/Plan:  1.poorly differentiated endometrial carcinoma IA, grade 1 with no high risk features at surgery 12-2011, then recurrent to vagina 04-2013 and progressive in pelvis despite chemo and RT. 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 Doxil x6 cycles from 11-03-14 thru 03-22-15. CTs show disease still limited to pelvis, seems stable with doxil. Now on alternating tamoxifen and megace every 3 weeks. Continue pain medication with fentanyl patches 175 mcg using brands that adhere adequately. Slight intermittent  vaginal bleeding as above, follow. I believe that she has had maximal RT, but can discuss again with Dr Sondra Come if more bleeding.  2.PAC in 3.multifactoria anemia: Hgb stable at 9.4, not more symptomatic. Iron studies better, so no need for IV iron. Follow  4.difficulty sleeping: continue Remeron at hs and use ativan if needed during night 5.nephrolithiasis, left breast biopsies  6.advance directives in place 7.good EF by echocardiogram 07-23-14 8.ostomy output better with daily miralax, also to discuss irrigation and skin care with ostomy liason later today.   All questions answered. We will be in touch with her about results of iron studies. She will need prescriptions for pain medication in 4 weeks if not seen then. Cc Dr Scotty Court. Time spent 25 min including >50% counseling and coordination of care.    LIVESAY,LENNIS P, MD   05/13/2015, 12:19 PM

## 2015-05-18 ENCOUNTER — Ambulatory Visit (HOSPITAL_BASED_OUTPATIENT_CLINIC_OR_DEPARTMENT_OTHER): Payer: Medicare Other

## 2015-05-18 ENCOUNTER — Other Ambulatory Visit: Payer: Self-pay

## 2015-05-18 ENCOUNTER — Other Ambulatory Visit (HOSPITAL_BASED_OUTPATIENT_CLINIC_OR_DEPARTMENT_OTHER): Payer: Medicare Other

## 2015-05-18 ENCOUNTER — Telehealth: Payer: Self-pay | Admitting: *Deleted

## 2015-05-18 ENCOUNTER — Other Ambulatory Visit: Payer: Self-pay | Admitting: Oncology

## 2015-05-18 ENCOUNTER — Telehealth: Payer: Self-pay | Admitting: Oncology

## 2015-05-18 VITALS — BP 111/63 | HR 76 | Temp 98.9°F | Resp 18

## 2015-05-18 DIAGNOSIS — C541 Malignant neoplasm of endometrium: Secondary | ICD-10-CM

## 2015-05-18 DIAGNOSIS — E860 Dehydration: Secondary | ICD-10-CM

## 2015-05-18 DIAGNOSIS — C7989 Secondary malignant neoplasm of other specified sites: Secondary | ICD-10-CM

## 2015-05-18 DIAGNOSIS — R109 Unspecified abdominal pain: Secondary | ICD-10-CM | POA: Diagnosis not present

## 2015-05-18 DIAGNOSIS — R300 Dysuria: Secondary | ICD-10-CM

## 2015-05-18 LAB — CBC WITH DIFFERENTIAL/PLATELET
BASO%: 0.3 % (ref 0.0–2.0)
Basophils Absolute: 0 10*3/uL (ref 0.0–0.1)
EOS ABS: 0.2 10*3/uL (ref 0.0–0.5)
EOS%: 1.4 % (ref 0.0–7.0)
HCT: 27.5 % — ABNORMAL LOW (ref 34.8–46.6)
HEMOGLOBIN: 8.8 g/dL — AB (ref 11.6–15.9)
LYMPH#: 1.3 10*3/uL (ref 0.9–3.3)
LYMPH%: 9.7 % — ABNORMAL LOW (ref 14.0–49.7)
MCH: 30.5 pg (ref 25.1–34.0)
MCHC: 32.2 g/dL (ref 31.5–36.0)
MCV: 94.9 fL (ref 79.5–101.0)
MONO#: 0.6 10*3/uL (ref 0.1–0.9)
MONO%: 4.7 % (ref 0.0–14.0)
NEUT%: 83.9 % — ABNORMAL HIGH (ref 38.4–76.8)
NEUTROS ABS: 11 10*3/uL — AB (ref 1.5–6.5)
Platelets: 226 10*3/uL (ref 145–400)
RBC: 2.9 10*6/uL — AB (ref 3.70–5.45)
RDW: 17.6 % — AB (ref 11.2–14.5)
WBC: 13.1 10*3/uL — AB (ref 3.9–10.3)

## 2015-05-18 LAB — URINALYSIS, MICROSCOPIC - CHCC
Bacteria, UA: NEGATIVE
Bilirubin (Urine): NEGATIVE
Blood: NEGATIVE
Glucose: NEGATIVE mg/dL
Ketones: NEGATIVE mg/dL
Nitrite: NEGATIVE
PROTEIN: NEGATIVE mg/dL
RBC / HPF: NEGATIVE (ref 0–2)
Specific Gravity, Urine: 1.005 (ref 1.003–1.035)
Urobilinogen, UR: 0.2 mg/dL (ref 0.2–1)
pH: 5 (ref 4.6–8.0)

## 2015-05-18 MED ORDER — SODIUM CHLORIDE 0.9 % IJ SOLN
10.0000 mL | INTRAMUSCULAR | Status: DC | PRN
  Administered 2015-05-18: 10 mL via INTRAVENOUS
  Filled 2015-05-18: qty 10

## 2015-05-18 MED ORDER — HEPARIN SOD (PORK) LOCK FLUSH 100 UNIT/ML IV SOLN
500.0000 [IU] | Freq: Once | INTRAVENOUS | Status: AC
  Administered 2015-05-18: 500 [IU] via INTRAVENOUS
  Filled 2015-05-18: qty 5

## 2015-05-18 MED ORDER — SODIUM CHLORIDE 0.9 % IV SOLN
Freq: Once | INTRAVENOUS | Status: AC
Start: 2015-05-18 — End: 2015-05-18
  Administered 2015-05-18: 14:00:00 via INTRAVENOUS

## 2015-05-18 NOTE — Telephone Encounter (Signed)
Confirmed appointment for August 25. Patient is waiting for call back from nurse.

## 2015-05-18 NOTE — Telephone Encounter (Signed)
PT. HAS CALLED AGAIN. HER SWEATING HAS IMPROVED BUT SHE HAS EMPTIED HER OSTOMY FOR THE FOURTH TIME IN 24 HOURS OF SOFT STOOL. SHE WAS ASKING IF SHE NEEDED A PRESCRIPTION FOR THE OSTOMY OUTPUT.

## 2015-05-18 NOTE — Patient Instructions (Signed)
Dehydration, Adult Dehydration is when you lose more fluids from the body than you take in. Vital organs like the kidneys, brain, and heart cannot function without a proper amount of fluids and salt. Any loss of fluids from the body can cause dehydration.  CAUSES   Vomiting.  Diarrhea.  Excessive sweating.  Excessive urine output.  Fever. SYMPTOMS  Mild dehydration  Thirst.  Dry lips.  Slightly dry mouth. Moderate dehydration  Very dry mouth.  Sunken eyes.  Skin does not bounce back quickly when lightly pinched and released.  Dark urine and decreased urine production.  Decreased tear production.  Headache. Severe dehydration  Very dry mouth.  Extreme thirst.  Rapid, weak pulse (more than 100 beats per minute at rest).  Cold hands and feet.  Not able to sweat in spite of heat and temperature.  Rapid breathing.  Blue lips.  Confusion and lethargy.  Difficulty being awakened.  Minimal urine production.  No tears. DIAGNOSIS  Your caregiver will diagnose dehydration based on your symptoms and your exam. Blood and urine tests will help confirm the diagnosis. The diagnostic evaluation should also identify the cause of dehydration. TREATMENT  Treatment of mild or moderate dehydration can often be done at home by increasing the amount of fluids that you drink. It is best to drink small amounts of fluid more often. Drinking too much at one time can make vomiting worse. Refer to the home care instructions below. Severe dehydration needs to be treated at the hospital where you will probably be given intravenous (IV) fluids that contain water and electrolytes. HOME CARE INSTRUCTIONS   Ask your caregiver about specific rehydration instructions.  Drink enough fluids to keep your urine clear or pale yellow.  Drink small amounts frequently if you have nausea and vomiting.  Eat as you normally do.  Avoid:  Foods or drinks high in sugar.  Carbonated  drinks.  Juice.  Extremely hot or cold fluids.  Drinks with caffeine.  Fatty, greasy foods.  Alcohol.  Tobacco.  Overeating.  Gelatin desserts.  Wash your hands well to avoid spreading bacteria and viruses.  Only take over-the-counter or prescription medicines for pain, discomfort, or fever as directed by your caregiver.  Ask your caregiver if you should continue all prescribed and over-the-counter medicines.  Keep all follow-up appointments with your caregiver. SEEK MEDICAL CARE IF:  You have abdominal pain and it increases or stays in one area (localizes).  You have a rash, stiff neck, or severe headache.  You are irritable, sleepy, or difficult to awaken.  You are weak, dizzy, or extremely thirsty. SEEK IMMEDIATE MEDICAL CARE IF:   You are unable to keep fluids down or you get worse despite treatment.  You have frequent episodes of vomiting or diarrhea.  You have blood or green matter (bile) in your vomit.  You have blood in your stool or your stool looks black and tarry.  You have not urinated in 6 to 8 hours, or you have only urinated a small amount of very dark urine.  You have a fever.  You faint. MAKE SURE YOU:   Understand these instructions.  Will watch your condition.  Will get help right away if you are not doing well or get worse. Document Released: 10/30/2005 Document Revised: 01/22/2012 Document Reviewed: 06/19/2011 ExitCare Patient Information 2015 [employer]. This information is not intended to replace advice given to you by your health care provider. Make sure you discuss any questions you have with your health care   provider.  

## 2015-05-18 NOTE — Telephone Encounter (Signed)
PT. IS HAVING EXCESSIVE SWEATING. SHE IS COLD AND CLAMMY. TEMPERATURE IS 97.6. NO CHEST PAIN, SHORTNESS OF BREATH, OR DIZZINESS. NO NAUSEA OR VOMITING. PT. HAS EMPTIED HER OSTOMY THREE TIMES IN THE PAST 24 HOURS. HER FLUID INTAKE HAS BEEN 48 OUNCES IN THE PAST 24 HOURS. PT. STATES THIS IS HOW SHE FELT THE LAST TIME SHE WAS DEHYDRATED AND RECEIVED IV FLUIDS. SHOULD PT. SEE CYNDEE BACON,NP?

## 2015-05-18 NOTE — Telephone Encounter (Signed)
Ms. Hobin states that she experiences some abdominal pain/cramping prior to feeling clammy and cold.  She states that there is soft stool in her colostomy bag after this feeling passes.  Told her that she could be experiencing a vasovagal reaction with passing of stool.  She needs to lay down until the clammy coldness passes so she does not pass out.  Patient verbalized understanding. Sh needs to increase her fluid intake to 64 oz daily at least.  Will bring her in today for lab and urine specimen  To see if a UTI is contributing to her symptoms. Pt. Appointment is at 1:30 for lab and 2 pm for IVF. Told Ms. Wist that Dr. [name] is ordering a CT A/P to be done in the next 1-2 weeks to evaluate increased cramping/pain. She does not need medication to slow her bowels down as they have been moving better recently.

## 2015-05-19 ENCOUNTER — Telehealth: Payer: Self-pay

## 2015-05-19 LAB — URINE CULTURE

## 2015-05-19 NOTE — Telephone Encounter (Signed)
-----   Message from Gordy Levan, MD sent at 05/19/2015  9:10 AM EDT ----- Labs seen and need follow up: UA ok, follow up culture until final. Hemoglobin lower at 8.8, would transfuse 1 unit if drops any more or more symptomatic from anemia. Need to watch for CT report on Friday. If worse before Friday should go to ED

## 2015-05-19 NOTE — Telephone Encounter (Signed)
Patient Demographics     Patient Name Sex DOB SSN Address Phone    Pam Vazquez, Pam Vazquez Female [birth year] [id number] [place] [address] [phone] (Home) [phone] (Mobile)      Message  Received: 4 days ago    Gordy Levan, MD  Baruch Merl, RN           Please let her know iron studies were some better, so she does not need IV iron. Please continue oral iron.   She will need scripts for pain meds in late July, then with my next visit in August.   thanks

## 2015-05-19 NOTE — Telephone Encounter (Signed)
Told Ms. Advincula that her u/a looked fine and will f/u with culture as noted below by Dr. [name].

## 2015-05-19 NOTE — Telephone Encounter (Signed)
Ms. Logue stated that she felt much better after receiving the IVF yesterday.  No more episodes of clamminess  or sweating since IVF.  Told her that her Hgb was low at at 8.8.  Pt states that she is not SOB with exertion.  Told her that the iron studies were better from 05-13-15. Continue  Oral iron as noted below by Dr. [name]. Told Ms. Coggeshall that if she feels worse or has any sweating and clammy episodes that Dr. [name] wants her to go to the ED. CT scan is on Friday 05-28-15 at 12 noon.  Pt forgot to get contrast yesterday and will come to [place] and pick up prior to 05-28-15.

## 2015-05-21 ENCOUNTER — Telehealth: Payer: Self-pay

## 2015-05-21 NOTE — Telephone Encounter (Signed)
-----   Message from Gordy Levan, MD sent at 05/21/2015 12:26 PM EDT ----- Labs seen and need follow up: can let her know no UTI. CT pending this afternoon also

## 2015-05-21 NOTE — Telephone Encounter (Signed)
Told Pam Vazquez the result of the Urine culture from 05-18-15 as noted below by Dr. [name]. Ms. Garciagarcia is getting in closer to 64 oz of fluid daily.  She is feeling better and has not had anymore episodes of feeling clammy/sweating. Her bowels moved once today and was more formed.

## 2015-05-28 ENCOUNTER — Ambulatory Visit (HOSPITAL_COMMUNITY): Payer: Medicare Other

## 2015-05-28 ENCOUNTER — Telehealth: Payer: Self-pay | Admitting: Nurse Practitioner

## 2015-05-28 NOTE — Telephone Encounter (Signed)
-----   Message from Gordy Levan, MD sent at 05/28/2015  1:27 PM EDT ----- CT was particularly due to sweats/ chills and symptoms at night, which I see from other recent notes have improved, so would not reschedule CT now.  I'm glad those problems are better, be sure to continue good oral hydration. Just for my information, please ask patient if she can tolerate MRI scans, as this may be better option than CT if/ when she needs another scan. If RN not able to reach her, I can follow up this re MRI when she is seen next.  thanks ----- Message -----    From: Alla Feeling, RN    Sent: 05/28/2015  12:48 PM      To: Baruch Merl, RN, Gordy Levan, MD  RN returned call to patient regarding pt cancellation of CT scan today. Patient reports she was "up all night going to the bathroom (urinating) every 15-20 minutes." colostomy bag is empty of stool at this time. Patient felt overwhelmed with the idea of traveling 45 minutes from home for CT scan, and she felt anxious that she might have a bathroom accident while on the imaging table.  Pt also states during last CT, contrast made "my bag fill up that it almost burst, and I couldn't deal with that after the night I had."  Rn voices understanding. Patient called radiology herself to cancel, did not reschedule at this time. Patient denies fever. She does report rectal pain that she believes to be from hemorrhoids, RN encouraged pain medication prn and recommended sitz bath. Patient verbalizes understanding.  Patient also wanted to report and inform Dr. [name] that she saw [name], who made recommendations for irrigation and bladder training. Will inform MD and f/u with any new suggestions or orders.

## 2015-05-28 NOTE — Telephone Encounter (Signed)
Upon call back, patient reports she is feeling better than this morning.  She continues to drink fluids "because I know the result if I don't." She believes she could tolerate MRI better than CT but would like to discuss more with Dr. [name] at next appt. She is informed we are not rescheduling CT at this time.  She denies other needs currently and has been informed to call [facility] any time with new or worsening complaints.  She verbalizes understanding.

## 2015-05-28 NOTE — Telephone Encounter (Signed)
RN returned call to patient regarding pt cancellation of CT scan today. Patient reports she was "up all night going to the bathroom (urinating) every 15-20 minutes." colostomy bag is empty of stool at this time. Patient felt overwhelmed with the idea of traveling 45 minutes from home for CT scan, and she felt anxious that she might have a bathroom accident while on the imaging table.  Pt also states during last CT, contrast made "my bag fill up that it almost burst, and I couldn't deal with that after the night I had."  Rn voices understanding. Patient called radiology herself to cancel, did not reschedule at this time. Patient denies fever. She does report rectal pain that she believes to be from hemorrhoids, RN encouraged pain medication prn and recommended sitz bath. Patient verbalizes understanding.  Patient also wanted to report and inform Dr. [name] that she saw [name], who made recommendations for irrigation and bladder training. Will inform MD and f/u with any new suggestions or orders.

## 2015-06-16 ENCOUNTER — Telehealth: Payer: Self-pay

## 2015-06-16 NOTE — Telephone Encounter (Signed)
Ms. Pam Vazquez called 06-15-15 stating that she needs a refill on her patches. Current dose is 175 mcg q 72 hours.  She stated that her pain is a 6/10 most of the time and want to see if Dr. [name] would prescribe 200 mcg of Duragesic.   Ms. Pam Vazquez states that she occasionally takes 2 Oxycodone 10 mg tabs for breakthrough pain.  L/R was 11-26-14 for 100 tabs.  She took oxycodone 20 mg yesterday afternoon.  She states that the pain  Remains a 6/10.   Suggested that she take 20 mg when conversation done~ 8:15 pm and repeat in 4 hours and see if using medication more frequently her pain would be better controlled.   Told Ms. Pam Vazquez that the number of oxycodone tabs used daily is used to determine how much the Duragesic pathces need to be increased for better pain control. Told Ms. Pam Vazquez that this nurse would follow up on pain management today.  Left a message to call back to discuss pain management.  Ms. Pam Vazquez also wanted to see if Dr. [name] would order an MRI as recommended on 05-28-15 by Dr. [name] instead of CT Scan with all her bowel issues with contrast.

## 2015-06-17 ENCOUNTER — Other Ambulatory Visit: Payer: Self-pay | Admitting: Gynecologic Oncology

## 2015-06-17 DIAGNOSIS — C541 Malignant neoplasm of endometrium: Secondary | ICD-10-CM

## 2015-06-17 DIAGNOSIS — C7989 Secondary malignant neoplasm of other specified sites: Secondary | ICD-10-CM

## 2015-06-17 DIAGNOSIS — R32 Unspecified urinary incontinence: Secondary | ICD-10-CM

## 2015-06-17 DIAGNOSIS — G893 Neoplasm related pain (acute) (chronic): Secondary | ICD-10-CM

## 2015-06-17 MED ORDER — OXYCODONE HCL 10 MG PO TABS: ORAL_TABLET | ORAL | Status: DC

## 2015-06-17 MED ORDER — FENTANYL 75 MCG/HR TD PT72: MEDICATED_PATCH | TRANSDERMAL | Status: DC

## 2015-06-17 MED ORDER — FENTANYL 100 MCG/HR TD PT72: MEDICATED_PATCH | TRANSDERMAL | Status: DC

## 2015-06-17 NOTE — Progress Notes (Signed)
See RN note.

## 2015-06-17 NOTE — Telephone Encounter (Signed)
Called to check on patient's status and to see if her pain has improved with oxycodone tablets PRN. Patient states she took 6 tablets of the oxycodone total yesterday and her pain this morning is 3/10. She reports only taking Aleve so far this morning but states she is going to take oxycodone shortly so her pain does not get too elevated. Told patient refill for fentanyl (for same dose 128mcg) and for oxycodone tablets will be ready for pickup at [facility]. Patient agreeable with current plan and to not increase the fentanyl patch for now. Told patient that if her pain continues to increase prior to her scheduled appt on 8-25 with Dr. [name] to please call our office. Also, reminded patient to remain diligent about moving her bowels since she is taking the oxycodone tablets more frequently now - patient states she will continue taking Miralax and will call if she has any problems with constipation.

## 2015-06-25 ENCOUNTER — Other Ambulatory Visit: Payer: Self-pay | Admitting: Oncology

## 2015-06-25 ENCOUNTER — Telehealth: Payer: Self-pay

## 2015-06-25 ENCOUNTER — Telehealth: Payer: Self-pay | Admitting: Oncology

## 2015-06-25 DIAGNOSIS — C7989 Secondary malignant neoplasm of other specified sites: Secondary | ICD-10-CM

## 2015-06-25 DIAGNOSIS — C541 Malignant neoplasm of endometrium: Secondary | ICD-10-CM

## 2015-06-25 NOTE — Telephone Encounter (Signed)
Left a message for Pam Vazquez stating that Dr. [name] put an order in for an MRI o the abdomen and pelvis to be done prior to her visit on 07-08-15 with Dr. [name].  She should hear from central schdeuling next week about. Appointment .  The scan needs to be prior authorized before it is scheduled so she may not hear from the schedulers until mid to late next week. If she has any further concerns or questions she can call back to the office on Monday 06-28-15.

## 2015-06-25 NOTE — Telephone Encounter (Signed)
Mri order noted and patient will get a call from central scheduling °

## 2015-06-28 ENCOUNTER — Telehealth: Payer: Self-pay | Admitting: Nurse Practitioner

## 2015-06-28 DIAGNOSIS — C7989 Secondary malignant neoplasm of other specified sites: Secondary | ICD-10-CM

## 2015-06-28 DIAGNOSIS — C541 Malignant neoplasm of endometrium: Secondary | ICD-10-CM

## 2015-06-28 MED ORDER — FERROUS FUMARATE 325 (106 FE) MG PO TABS: 1.0000 | ORAL_TABLET | Freq: Every day | ORAL | Status: DC

## 2015-06-28 NOTE — Telephone Encounter (Signed)
OK to refill hemocyte per Dr. [name]; Rn sent rx refill to pharmacy.   When Rn called pt back to inform her about IV contrast, she reports constant leaking of stool from stoma. She wonders if this is normal? She reports she had a "nice" BM today but stool continues to leak. Stoma is red and looks "healthy" per pt. She denies ABD pain, cramping, distention, or feeling firm. Pt reports she has to change her bag 2-3 times per day. RN encourages patient to call GI provider for advice on stool leakage and I will alert Dr. [name] for further recommendations. She verbalizes understanding and thanks for the call.   Per Dr. [name], patient may try to irrigate ostomy but does not recommend medication to stop stool from coming out. MD is encouraged by BM today and feels that stool coming out may help patient feel better. This information was left of patient's identifying voicemail with instructions to call back with further questions or concerns.

## 2015-06-28 NOTE — Telephone Encounter (Signed)
Patient calling in regards to upcoming MRI with contrast. She is concerned about drinking contrast because it makes her feel like she can't get home in time to empty colostomy bag. Per MRI, patient will receive 9-10 ml IV contrast and should not cause GI discomfort. Will inform patient of this.   Pt also asking if she should continue "Hematinic" (spelled by patient) 1 tablet daily in the morning with orange juice on empty stomach; patient will need refill if Dr. [name] wishes her to continue.   MD to advise and Rn will call patient with information.

## 2015-07-02 ENCOUNTER — Other Ambulatory Visit: Payer: Self-pay | Admitting: Oncology

## 2015-07-02 ENCOUNTER — Ambulatory Visit (HOSPITAL_COMMUNITY)
Admission: RE | Admit: 2015-07-02 | Discharge: 2015-07-02 | Disposition: A | Discharge status: Home / Self Care | Payer: Medicare Other | Source: Ambulatory Visit | Attending: Oncology | Admitting: Oncology

## 2015-07-02 DIAGNOSIS — C7989 Secondary malignant neoplasm of other specified sites: Secondary | ICD-10-CM

## 2015-07-02 DIAGNOSIS — C541 Malignant neoplasm of endometrium: Secondary | ICD-10-CM

## 2015-07-02 DIAGNOSIS — Z0189 Encounter for other specified special examinations: Secondary | ICD-10-CM | POA: Insufficient documentation

## 2015-07-02 DIAGNOSIS — Z8542 Personal history of malignant neoplasm of other parts of uterus: Secondary | ICD-10-CM | POA: Insufficient documentation

## 2015-07-02 DIAGNOSIS — Z08 Encounter for follow-up examination after completed treatment for malignant neoplasm: Secondary | ICD-10-CM | POA: Insufficient documentation

## 2015-07-02 LAB — POCT I-STAT CREATININE: CREATININE: 1.1 mg/dL — AB (ref 0.44–1.00)

## 2015-07-04 ENCOUNTER — Other Ambulatory Visit: Payer: Self-pay | Admitting: Oncology

## 2015-07-05 ENCOUNTER — Telehealth: Payer: Self-pay | Admitting: *Deleted

## 2015-07-05 DIAGNOSIS — C7989 Secondary malignant neoplasm of other specified sites: Secondary | ICD-10-CM

## 2015-07-05 MED ORDER — FERROUS FUMARATE 325 (106 FE) MG PO TABS: 1.0000 | ORAL_TABLET | Freq: Every day | ORAL | Status: AC

## 2015-07-05 NOTE — Telephone Encounter (Signed)
Told Ms. Eastwood that Dr. [name] will see her Thursday 07-08-15 and discuss the plan for follow up scans. Ms. Storr stated that she could not stay in the scanner.  The further she went in the more difficult it was to breath.  Told her that some times patients take some medication ahead of time to relax them because of these symptoms. She also stated that she will discuss the increased drainage of the "small" stoma below the one with the collection bag.  A sticky tanish drainage is coming from it.  She will try using a sanitary napkin over the area as Ostomy nurse Juliann Pulse Propst suggested a while back.  Ms. Houde is not sure if the amount of drainage is to be expected.  It is a mess.  A Band-Aid is not sufficient.  She will discuss with Dr. [name] Thursday.

## 2015-07-05 NOTE — Telephone Encounter (Signed)
"  I was scheduled for MRI Friday but was unable to finish going through the test."  Call transferred to collaborative voicemail.  Next scheduled F/U is July 08, 2015 for 12:30 lab, 1:00 F/U with Dr. [name].

## 2015-07-05 NOTE — Addendum Note (Signed)
Addended by: Baruch Merl on: 07/05/2015 08:27 PM   Modules accepted: Orders

## 2015-07-08 ENCOUNTER — Ambulatory Visit (HOSPITAL_BASED_OUTPATIENT_CLINIC_OR_DEPARTMENT_OTHER): Payer: Medicare Other

## 2015-07-08 ENCOUNTER — Telehealth: Payer: Self-pay | Admitting: Oncology

## 2015-07-08 ENCOUNTER — Other Ambulatory Visit (HOSPITAL_BASED_OUTPATIENT_CLINIC_OR_DEPARTMENT_OTHER): Payer: Medicare Other

## 2015-07-08 ENCOUNTER — Ambulatory Visit (HOSPITAL_BASED_OUTPATIENT_CLINIC_OR_DEPARTMENT_OTHER): Payer: Medicare Other | Admitting: Oncology

## 2015-07-08 ENCOUNTER — Encounter: Payer: Self-pay | Admitting: Oncology

## 2015-07-08 VITALS — BP 124/67 | HR 74 | Temp 98.1°F | Resp 18 | Ht 60.0 in | Wt 125.2 lb

## 2015-07-08 DIAGNOSIS — Z95828 Presence of other vascular implants and grafts: Secondary | ICD-10-CM

## 2015-07-08 DIAGNOSIS — C541 Malignant neoplasm of endometrium: Secondary | ICD-10-CM | POA: Diagnosis not present

## 2015-07-08 DIAGNOSIS — C7989 Secondary malignant neoplasm of other specified sites: Secondary | ICD-10-CM

## 2015-07-08 DIAGNOSIS — R300 Dysuria: Secondary | ICD-10-CM

## 2015-07-08 DIAGNOSIS — K9419 Other complications of enterostomy: Secondary | ICD-10-CM | POA: Diagnosis not present

## 2015-07-08 DIAGNOSIS — R32 Unspecified urinary incontinence: Secondary | ICD-10-CM

## 2015-07-08 DIAGNOSIS — D500 Iron deficiency anemia secondary to blood loss (chronic): Secondary | ICD-10-CM | POA: Diagnosis not present

## 2015-07-08 DIAGNOSIS — G893 Neoplasm related pain (acute) (chronic): Secondary | ICD-10-CM | POA: Diagnosis not present

## 2015-07-08 LAB — CBC WITH DIFFERENTIAL/PLATELET
BASO%: 0.6 % (ref 0.0–2.0)
Basophils Absolute: 0.1 10*3/uL (ref 0.0–0.1)
EOS ABS: 0.3 10*3/uL (ref 0.0–0.5)
EOS%: 1.8 % (ref 0.0–7.0)
HCT: 30.6 % — ABNORMAL LOW (ref 34.8–46.6)
HGB: 9.8 g/dL — ABNORMAL LOW (ref 11.6–15.9)
LYMPH%: 10.5 % — AB (ref 14.0–49.7)
MCH: 30.5 pg (ref 25.1–34.0)
MCHC: 32.2 g/dL (ref 31.5–36.0)
MCV: 95 fL (ref 79.5–101.0)
MONO#: 0.8 10*3/uL (ref 0.1–0.9)
MONO%: 5.4 % (ref 0.0–14.0)
NEUT%: 81.7 % — ABNORMAL HIGH (ref 38.4–76.8)
NEUTROS ABS: 12.5 10*3/uL — AB (ref 1.5–6.5)
Platelets: 257 10*3/uL (ref 145–400)
RBC: 3.22 10*6/uL — AB (ref 3.70–5.45)
RDW: 15.6 % — ABNORMAL HIGH (ref 11.2–14.5)
WBC: 15.3 10*3/uL — AB (ref 3.9–10.3)
lymph#: 1.6 10*3/uL (ref 0.9–3.3)

## 2015-07-08 LAB — COMPREHENSIVE METABOLIC PANEL (CC13)
ALK PHOS: 77 U/L (ref 40–150)
ALT: 11 U/L (ref 0–55)
AST: 16 U/L (ref 5–34)
Albumin: 2.9 g/dL — ABNORMAL LOW (ref 3.5–5.0)
Anion Gap: 10 mEq/L (ref 3–11)
BILIRUBIN TOTAL: 0.46 mg/dL (ref 0.20–1.20)
BUN: 22.3 mg/dL (ref 7.0–26.0)
CO2: 23 meq/L (ref 22–29)
Calcium: 9.4 mg/dL (ref 8.4–10.4)
Chloride: 109 mEq/L (ref 98–109)
Creatinine: 1.2 mg/dL — ABNORMAL HIGH (ref 0.6–1.1)
EGFR: 45 mL/min/{1.73_m2} — AB (ref >90)
GLUCOSE: 88 mg/dL (ref 70–140)
POTASSIUM: 3.5 meq/L (ref 3.5–5.1)
SODIUM: 142 meq/L (ref 136–145)
TOTAL PROTEIN: 6.5 g/dL (ref 6.4–8.3)

## 2015-07-08 LAB — URINALYSIS, MICROSCOPIC - CHCC
BILIRUBIN (URINE): NEGATIVE
Glucose: NEGATIVE mg/dL
KETONES: NEGATIVE mg/dL
Nitrite: NEGATIVE
PH: 6 (ref 4.6–8.0)
Protein: 30 mg/dL
Specific Gravity, Urine: 1.02 (ref 1.003–1.035)
Urobilinogen, UR: 0.2 mg/dL (ref 0.2–1)

## 2015-07-08 MED ORDER — FENTANYL 75 MCG/HR TD PT72: MEDICATED_PATCH | TRANSDERMAL | Status: DC

## 2015-07-08 MED ORDER — FENTANYL 100 MCG/HR TD PT72: MEDICATED_PATCH | TRANSDERMAL | Status: DC

## 2015-07-08 MED ORDER — SODIUM CHLORIDE 0.9 % IJ SOLN
10.0000 mL | INTRAMUSCULAR | Status: DC | PRN
  Administered 2015-07-08: 10 mL via INTRAVENOUS
  Filled 2015-07-08: qty 10

## 2015-07-08 MED ORDER — HEPARIN SOD (PORK) LOCK FLUSH 100 UNIT/ML IV SOLN
500.0000 [IU] | Freq: Once | INTRAVENOUS | Status: AC
  Administered 2015-07-08: 500 [IU] via INTRAVENOUS
  Filled 2015-07-08: qty 5

## 2015-07-08 MED ORDER — OXYCODONE HCL 10 MG PO TABS: ORAL_TABLET | ORAL | Status: DC

## 2015-07-08 NOTE — Patient Instructions (Signed)
Take Remeron (mirtazapine) 30 mg (=2 of the 15 mg tablets at 10 PM nightly, until you see Dr Scotty Court  Make appointment to see Dr Scotty Court in ~ 2 weeks from now.

## 2015-07-08 NOTE — Telephone Encounter (Signed)
Pt confirmed labs/ov per 08/25 POF, gave pt avs and calendar... KJ

## 2015-07-08 NOTE — Patient Instructions (Signed)

## 2015-07-08 NOTE — Progress Notes (Signed)
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   She has not had restaging scans as we had planned, as she did not feel she could tolerate oral contrast for CT due to related ostomy output, but could not tolerate MRI due to claustrophobia. She did not refill tamoxifen and prefers to stay on Megace alone for now.  Pain is still controlled adequately on fentanyl 175 mcg patch, gabapentin and prn oxycodone. She is having a difficult time managing ostomy and the mucous fistula, but has not followed up with ostomy staff at [facility]. She sleeps poorly and feels chronically fatigued with this. She uses Remeron 15 mg at night, but sometimes does not take this until MN or later. She has not gotten back to Dr Scotty Court re Remeron since I saw her last in June. Intermittently she is up multiple times at night to void, which is normal amount, no dysuria, no fever. She feels well enough at times to work in yard, which she enjoys.    PAC flushed today  ONCOLOGIC HISTORY Patient had robotic assisted total laparoscopic hysterectomy bilateral salpingo-oophorectomy right pelvic lymph node biopsy on 12/19/2011. 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 There was no involvement of other organs lymphovascular invasion was not identified. 3 lymph nodes were negative for metastatic disease. 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  Patient was seen by  Dr. [name] on 04/29/2013 with a nodule in the vagina concerning for recurrence as well as 8 cm rectal mass. CT of the abdomen and pelvis showed a lobular mass at the vagina cuff measuring 7.0 x 3.2 cm proximally 4 cm craniocaudal dimension, with compression of the distal aspect of the sigmoid colon just above the rectum . There was also noted to be a large necrotic lymph node measuring 2.8 cm within the sigmoid mesocolon centrally. There are also small bilateral external iliac lymph nodes. There were no aggressive osseous lesions. 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  Patient received 6 cycles of taxol carboplatin from 06/03/2013 thru 48/25//0037, complicated by some peripheral neuropathy. There was an interval decrease in tumor burden therefore Dr. [name] recommended 3 additional cycles of Taxotere/Carboplatin, received from 11/27/13 through 01/15/14.  CT of the chest, abdomen, and pelvis on 02/10/14 demonstrated disease progression. She had radiation by Dr Sondra Come, [place] given from 02/23/14 thru 03-27-14. PET September 2015 had increased uptake in central pelvis involving the sigmoid colon, with rectovaginal mass confirmed on exam by gyn oncology. Patient declined total pelvic exenteration. Colonoscopy 09-08-14 confirmed near complete rectal obstruction from external mass. She had diverting colostomy by Dr [name] 09-24-14, no disease visualized outside of pelvis. She had 4 cycles of doxil from 11-03-14 thru 01-26-15, tolerated well. CT AP 02-15-15 had no disease outside of pelvis and some increase in the involved areas at vaginal cuff and right iliacus compared with scans from July and Sept 2015. She completed 6 cycles of doxil on 03-22-15. CT AP 03-24-15 was stable compared with the month prior.She  began alternating megace with tamoxifen every 3 weeks on 04-15-15.     Review of systems as above, also: NO SOB or respiratory problems. No bleeding.  No LE swelling. No new or different pain. No nausea or vomiting, appetite good on megace. Remainder of 10 point Review of Systems negative.  Objective:  Vital signs in last 24 hours:  BP 124/67 mmHg  Pulse 74  Temp(Src) 98.1 F (36.7 C) (Oral)  Resp 18  Ht 5' (1.524 m)  Wt 125 lb 3.2 oz (56.79 kg)  BMI 24.45 kg/m2  SpO2 99%  Weight up 7 lbs from 05-13-15  Alert, oriented, vague historian, seems more depressed. Ambulatory without difficulty, easily able to get on and off exam table  No alopecia  HEENT:PERRL, sclerae not icteric. Oral mucosa moist without lesions, posterior pharynx clear.  Neck supple. No JVD.  Lymphatics:no cervical,supraclavicular, axillary or inguinal adenopathy Resp: clear to auscultation bilaterally and normal percussion bilaterally Cardio: regular rate and rhythm. No gallop. GI: soft, nontender, not distended, no mass or organomegaly. Normally active bowel sounds. Some liquid and soft stool in ostomy bag. 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 Otherwise nonfocal. PSYCH as above Skin without rash, ecchymosis, petechiae Portacath-without erythema or tenderness, flushed today.  Lab Results:  Results for orders placed or performed in visit on 07/08/15  CBC with Differential  Result Value Ref Range   WBC 15.3 (H) 3.9 - 10.3 10e3/uL   NEUT# 12.5 (H) 1.5 - 6.5 10e3/uL   HGB 9.8 (L) 11.6 - 15.9 g/dL   HCT 30.6 (L) 34.8 - 46.6 %   Platelets 257 145 - 400 10e3/uL   MCV 95.0 79.5 - 101.0 fL   MCH 30.5 25.1 - 34.0 pg   MCHC 32.2 31.5 - 36.0 g/dL   RBC 3.22 (L) 3.70 - 5.45 10e6/uL   RDW 15.6 (H) 11.2 - 14.5 %   lymph# 1.6 0.9 - 3.3 10e3/uL   MONO# 0.8 0.1 - 0.9 10e3/uL   Eosinophils Absolute 0.3 0.0 - 0.5 10e3/uL   Basophils Absolute 0.1 0.0 - 0.1 10e3/uL   NEUT% 81.7 (H) 38.4 - 76.8 %   LYMPH% 10.5 (L) 14.0 - 49.7 %    MONO% 5.4 0.0 - 14.0 %   EOS% 1.8 0.0 - 7.0 %   BASO% 0.6 0.0 - 2.0 %  Comprehensive metabolic panel (Cmet) - [facility]  Result Value Ref Range   Sodium 142 136 - 145 mEq/L   Potassium 3.5 3.5 - 5.1 mEq/L   Chloride 109 98 - 109 mEq/L   CO2 23 22 - 29 mEq/L   Glucose 88 70 - 140 mg/dl   BUN 22.3 7.0 - 26.0 mg/dL   Creatinine 1.2 (H) 0.6 - 1.1 mg/dL   Total Bilirubin 0.46 0.20 - 1.20 mg/dL   Alkaline Phosphatase 77 40 - 150 U/L   AST 16 5 - 34 U/L   ALT 11 0 - 55 U/L   Total Protein 6.5 6.4 - 8.3 g/dL   Albumin 2.9 (L) 3.5 - 5.0 g/dL   Calcium 9.4 8.4 - 10.4 mg/dL   Anion Gap 10 3 - 11 mEq/L   EGFR 45 (L) >90 ml/min/1.73 m2     Studies/Results:  No results found.  Medications: I have reviewed the patient's current medications. No change in present pain medications, refill prescriptions written. Increase Remeron to 30 mg which she will take at 10  PM nightly until she follows up with Dr Scotty Court, hopefully in ~ 2 weeks.  DISCUSSION: We have decided that most useful plan will be to try to address the ostomy/ mucous fistula if any improvement can be made there, and to try to address sleep/ depression issues. She will continue Megace alone for now. As she is clinically stable from standpoint of the recurrent endometrial cancer, and as we do not have other good options for treatment, we will not try to get scans done now. I have given her written and oral instructions for Remeron and follow up with Dr Scotty Court as above. I have told her to use sanitary pad with liquid proof backing to cover the mucous fistula at all times. At her request, I will try to speak with ostomy staff at [facility], as patient is not clear about location to teach ostomy irrigation if that is done (and generally just does not seem to recall what was discussed there).  Patient feels that she could try CT if necessary in future, would just have to plan out ahead to allow bathroom stops after scan and be sure correct driver.   Daughter follows discussion well and is very supportive, as always. She tells me that at times patient does not seem as depressed as presentation today.  Assessment/Plan:  1.poorly differentiated endometrial carcinoma IA, grade 1 with no high risk features at surgery 12-2011, then recurrent to vagina 04-2013 and progressive in pelvis despite chemo and RT. 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 Doxil x6 cycles from 11-03-14 thru 03-22-15. CTs show disease still limited to pelvis, seems stable with doxil. Used alternating tamoxifen and megace every 3 weeks, tho recently not using tamoxifen. Continue pain medication with fentanyl patches 175 mcg using brands that adhere adequately. I believe that she has had maximal RT, but can discuss again with Dr Sondra Come if more bleeding. Clinically stable. Continue daily megace and I will see her back coordinating with PAC flush 2.PAC in, flushing every 6-8 weeks. 3.multifactoria anemia: Hgb a little better at 9.8, not more symptomatic. Iron studies better 05-13-15, so no need for IV iron. Follow.  4.difficulty sleeping and depression: also multifactorial, but will try Remeron at 30 mg at 10 pm nightly at least next 2 weeks and appreciate any other suggestions from Dr Scotty Court. Check UA C&S today due to intermittent nocturia also interfering with sleep 5.nephrolithiasis, left breast biopsies  6.advance directives in place 7.good EF by echocardiogram 07-23-14 8.ostomy output better with daily miralax, and more comfortable than with previous constipation problems. Ostomy itself and mucous fistula have been upsetting to her since that surgery, tho necessary due to bowel obstruction from pelvic tumor. Follow up with ostomy support staff as above.   Patient and daughter seem to understand discussion and are in agreement with recommendations and plans. CC Dr Scotty Court. Time spent 30 min including >50% counseling and  coordination of care.    Channa Hazelett P, MD   07/08/2015, 2:12 PM

## 2015-07-09 ENCOUNTER — Telehealth: Payer: Self-pay | Admitting: Oncology

## 2015-07-09 ENCOUNTER — Telehealth: Payer: Self-pay

## 2015-07-09 ENCOUNTER — Other Ambulatory Visit: Payer: Self-pay | Admitting: Oncology

## 2015-07-09 LAB — URINE CULTURE

## 2015-07-09 NOTE — Telephone Encounter (Signed)
MEDICAL ONCOLOGY  At request of patient and daughter, I called [facility] [phone] and LM for [facility], ostomy specialist, to call when she is back at their office on 07-12-15. Gave my office cell and RN direct phone #s.   When we speak with Tye Maryland: Patient mentioned that Tye Maryland would need to teach her irrigation using space at this office -? Or perhaps patient may just need to set up another appointment with Tye Maryland to begin whatever is possible to manage ostomy and mucous fistula.  [name], MD

## 2015-07-09 NOTE — Telephone Encounter (Signed)
Urine culture came back today with no growth.  Dr. [name] called patient and LM regarding no infection.  See note by Dr. [name] from 07-09-15.

## 2015-07-09 NOTE — Telephone Encounter (Signed)
-----   Message from Gordy Levan, MD sent at 07/09/2015  8:16 AM EDT ----- Labs seen and need follow up: looks like UTI. Please have her start Macrobid 100 mg bid (or pharmacy can substitute generic nitrofurantoin at qid dosing) x 5 days. Need to confirm sensitivities when culture back   Not sure who is covering desk -   thanks

## 2015-07-09 NOTE — Telephone Encounter (Signed)
Medical Oncology  Re urine culture from  07-08-15 no growth LM on home # that I am trying to reach her re culture results; LM on cell that she has no infection and does not need anitbiotic. Left MD cell for this afternoon and office # if questions.  RN aware, will not do script for antibiotic.  [name], MD

## 2015-07-12 ENCOUNTER — Telehealth: Payer: Self-pay | Admitting: Oncology

## 2015-07-12 NOTE — Telephone Encounter (Signed)
Medical Oncology  Spoke with Pam Vazquez from [facility]. Only 1 handicapped bathroom at their location, so cannot use it to teach irrigation, tho Juliann Pulse would be willing to come to [facility] if she could have access to bathroom here to teach and demonstrate. Not clear that patient is good candidate for irrigation, may not be able to manage this and not clear that bowels are moving regularly (and has not kept journal of this as Juliann Pulse suggested).  Discussed mucous fistula management.  Discussed fact that emotionally patient has not tolerated ostomy/ mucous fistula well from start. Juliann Pulse offered to call patient and set up time to discuss again and plan from there, which seems most appropriate.  Her help much appreciated.  [name], MD

## 2015-07-29 ENCOUNTER — Inpatient Hospital Stay (HOSPITAL_COMMUNITY)
Admission: EM | Admit: 2015-07-29 | Discharge: 2015-08-02 | DRG: 682 | Disposition: A | Discharge status: Home / Self Care | Payer: Medicare Other | Attending: Internal Medicine | Admitting: Internal Medicine

## 2015-07-29 ENCOUNTER — Encounter (HOSPITAL_COMMUNITY): Payer: Self-pay | Admitting: Emergency Medicine

## 2015-07-29 DIAGNOSIS — K565 Intestinal adhesions [bands] with obstruction (postprocedural) (postinfection): Secondary | ICD-10-CM | POA: Diagnosis present

## 2015-07-29 DIAGNOSIS — G893 Neoplasm related pain (acute) (chronic): Secondary | ICD-10-CM | POA: Diagnosis present

## 2015-07-29 DIAGNOSIS — Z6823 Body mass index (BMI) 23.0-23.9, adult: Secondary | ICD-10-CM

## 2015-07-29 DIAGNOSIS — Z87442 Personal history of urinary calculi: Secondary | ICD-10-CM

## 2015-07-29 DIAGNOSIS — J189 Pneumonia, unspecified organism: Secondary | ICD-10-CM

## 2015-07-29 DIAGNOSIS — C569 Malignant neoplasm of unspecified ovary: Secondary | ICD-10-CM | POA: Diagnosis present

## 2015-07-29 DIAGNOSIS — N390 Urinary tract infection, site not specified: Secondary | ICD-10-CM | POA: Diagnosis not present

## 2015-07-29 DIAGNOSIS — R112 Nausea with vomiting, unspecified: Secondary | ICD-10-CM | POA: Diagnosis present

## 2015-07-29 DIAGNOSIS — C541 Malignant neoplasm of endometrium: Secondary | ICD-10-CM | POA: Diagnosis present

## 2015-07-29 DIAGNOSIS — Z66 Do not resuscitate: Secondary | ICD-10-CM | POA: Diagnosis present

## 2015-07-29 DIAGNOSIS — Z833 Family history of diabetes mellitus: Secondary | ICD-10-CM

## 2015-07-29 DIAGNOSIS — Z9221 Personal history of antineoplastic chemotherapy: Secondary | ICD-10-CM

## 2015-07-29 DIAGNOSIS — E860 Dehydration: Secondary | ICD-10-CM | POA: Diagnosis present

## 2015-07-29 DIAGNOSIS — Z800 Family history of malignant neoplasm of digestive organs: Secondary | ICD-10-CM

## 2015-07-29 DIAGNOSIS — N1330 Unspecified hydronephrosis: Secondary | ICD-10-CM | POA: Diagnosis present

## 2015-07-29 DIAGNOSIS — A419 Sepsis, unspecified organism: Secondary | ICD-10-CM | POA: Diagnosis present

## 2015-07-29 DIAGNOSIS — Z8249 Family history of ischemic heart disease and other diseases of the circulatory system: Secondary | ICD-10-CM

## 2015-07-29 DIAGNOSIS — N189 Chronic kidney disease, unspecified: Secondary | ICD-10-CM | POA: Diagnosis present

## 2015-07-29 DIAGNOSIS — R58 Hemorrhage, not elsewhere classified: Secondary | ICD-10-CM | POA: Diagnosis present

## 2015-07-29 DIAGNOSIS — Z8542 Personal history of malignant neoplasm of other parts of uterus: Secondary | ICD-10-CM

## 2015-07-29 DIAGNOSIS — E119 Type 2 diabetes mellitus without complications: Secondary | ICD-10-CM | POA: Diagnosis present

## 2015-07-29 DIAGNOSIS — H409 Unspecified glaucoma: Secondary | ICD-10-CM | POA: Diagnosis present

## 2015-07-29 DIAGNOSIS — I129 Hypertensive chronic kidney disease with stage 1 through stage 4 chronic kidney disease, or unspecified chronic kidney disease: Secondary | ICD-10-CM | POA: Diagnosis present

## 2015-07-29 DIAGNOSIS — K56609 Unspecified intestinal obstruction, unspecified as to partial versus complete obstruction: Secondary | ICD-10-CM | POA: Diagnosis present

## 2015-07-29 DIAGNOSIS — Z87891 Personal history of nicotine dependence: Secondary | ICD-10-CM

## 2015-07-29 DIAGNOSIS — R109 Unspecified abdominal pain: Secondary | ICD-10-CM

## 2015-07-29 DIAGNOSIS — Z923 Personal history of irradiation: Secondary | ICD-10-CM

## 2015-07-29 DIAGNOSIS — N179 Acute kidney failure, unspecified: Principal | ICD-10-CM | POA: Diagnosis present

## 2015-07-29 DIAGNOSIS — D638 Anemia in other chronic diseases classified elsewhere: Secondary | ICD-10-CM | POA: Diagnosis present

## 2015-07-29 DIAGNOSIS — Z933 Colostomy status: Secondary | ICD-10-CM

## 2015-07-29 DIAGNOSIS — D630 Anemia in neoplastic disease: Secondary | ICD-10-CM | POA: Diagnosis present

## 2015-07-29 DIAGNOSIS — R630 Anorexia: Secondary | ICD-10-CM | POA: Diagnosis present

## 2015-07-29 DIAGNOSIS — J181 Lobar pneumonia, unspecified organism: Secondary | ICD-10-CM | POA: Diagnosis present

## 2015-07-29 DIAGNOSIS — E43 Unspecified severe protein-calorie malnutrition: Secondary | ICD-10-CM | POA: Diagnosis present

## 2015-07-29 LAB — LIPASE, BLOOD: LIPASE: 13 U/L — AB (ref 22–51)

## 2015-07-29 LAB — CBC WITH DIFFERENTIAL/PLATELET
Basophils Absolute: 0 10*3/uL (ref 0.0–0.1)
Basophils Relative: 0 %
EOS PCT: 0 %
Eosinophils Absolute: 0 10*3/uL (ref 0.0–0.7)
HEMATOCRIT: 31.8 % — AB (ref 36.0–46.0)
HEMOGLOBIN: 10.1 g/dL — AB (ref 12.0–15.0)
LYMPHS PCT: 6 %
Lymphs Abs: 0.9 10*3/uL (ref 0.7–4.0)
MCH: 30.3 pg (ref 26.0–34.0)
MCHC: 31.8 g/dL (ref 30.0–36.0)
MCV: 95.5 fL (ref 78.0–100.0)
MONOS PCT: 6 %
Monocytes Absolute: 0.9 10*3/uL (ref 0.1–1.0)
NEUTROS PCT: 88 %
Neutro Abs: 14 10*3/uL — ABNORMAL HIGH (ref 1.7–7.7)
Platelets: 313 10*3/uL (ref 150–400)
RBC: 3.33 MIL/uL — AB (ref 3.87–5.11)
RDW: 14.2 % (ref 11.5–15.5)
WBC: 15.8 10*3/uL — AB (ref 4.0–10.5)

## 2015-07-29 LAB — COMPREHENSIVE METABOLIC PANEL
ALBUMIN: 3.2 g/dL — AB (ref 3.5–5.0)
ALK PHOS: 84 U/L (ref 38–126)
ALT: 12 U/L — ABNORMAL LOW (ref 14–54)
AST: 25 U/L (ref 15–41)
Anion gap: 12 (ref 5–15)
BILIRUBIN TOTAL: 0.9 mg/dL (ref 0.3–1.2)
BUN: 23 mg/dL — AB (ref 6–20)
CALCIUM: 9.2 mg/dL (ref 8.9–10.3)
CO2: 24 mmol/L (ref 22–32)
Chloride: 107 mmol/L (ref 101–111)
Creatinine, Ser: 2 mg/dL — ABNORMAL HIGH (ref 0.44–1.00)
GFR calc Af Amer: 28 mL/min — ABNORMAL LOW (ref >60)
GFR calc non Af Amer: 24 mL/min — ABNORMAL LOW (ref >60)
GLUCOSE: 153 mg/dL — AB (ref 65–99)
Potassium: 3.7 mmol/L (ref 3.5–5.1)
SODIUM: 143 mmol/L (ref 135–145)
TOTAL PROTEIN: 7.6 g/dL (ref 6.5–8.1)

## 2015-07-29 LAB — I-STAT CG4 LACTIC ACID, ED: LACTIC ACID, VENOUS: 0.79 mmol/L (ref 0.5–2.0)

## 2015-07-29 MED ORDER — SODIUM CHLORIDE 0.9 % IV BOLUS (SEPSIS)
1000.0000 mL | Freq: Once | INTRAVENOUS | Status: AC
  Administered 2015-07-29: 1000 mL via INTRAVENOUS

## 2015-07-29 MED ORDER — ONDANSETRON HCL 4 MG/2ML IJ SOLN
4.0000 mg | INTRAMUSCULAR | Status: AC
  Administered 2015-07-29: 4 mg via INTRAVENOUS
  Filled 2015-07-29: qty 2

## 2015-07-29 NOTE — ED Notes (Addendum)
Pt c/o three episodes of emesis since 0700 today. Denies diarrhea, pain, dizziness, lightheadedness, fever and chills. Took compazine approximately 1600 today. Last episode of emesis approximately 1800 today. Hx of endometrial CA, last radiation May 2015.

## 2015-07-29 NOTE — ED Provider Notes (Signed)
CSN: [id number]     Arrival date & time 07/29/15  2000 History   First MD Initiated Contact with Patient 07/29/15 2205     No chief complaint on file.   HPI   Pam Vazquez is a 69 y.o. female with a PMH of hypertension, DM, endometrial cancer status post chemotherapy and radiation who presents to the ED with vomiting. She reports 3 episodes of emesis today. Denies hematemesis. She is unable to identify anything that precipitates her symptoms. She states she took her prescribed compazine, with minimal symptom relief. Reports colostomy output was initially more firm today, but since the onset of her emesis, has been loose. She denies hematochezia or melena. She denies fever, chills, headache, lightheadedness, dizziness, syncope, chest pain, shortness of breath.  Past Medical History  Diagnosis Date  . Angina     stress test on chart from 3/12/ none since March 2012  . Chronic kidney disease     kidney stones  . Hypertension     / EKG 1/13 Epic  . Radiation 02/23/14-03/27/14    45 gray to pelvic mass  . Glaucoma   . Cancer     endometrial- done with chemo and radiation  . Diabetes mellitus     no meds now   Past Surgical History  Procedure Laterality Date  . Hysteroscopy w/d&c  11/21/2011    Procedure: DILATATION AND CURETTAGE /HYSTEROSCOPY;  Surgeon: Gus Height;  Location: [facility];  Service: Gynecology;  Laterality: N/A;  . Tubal ligation    . Breast mass removal  1984, 1992    L breast  . Lithotripsy  2000, 2002  . Back surgery      91 and 2001  . Dilation and curettage of uterus    . Node dissection  12/19/2011    Procedure: NODE DISSECTION;  Surgeon: [name], MD PHD;  Location: [facility];  Service: Gynecology;  Laterality: N/A;  . Robotic assisted total hysterectomy with bilateral salpingo oopherectomy  12/19/2011    robotic-assisted total laparoscopic hysterectomy bilateral salpingo-oophorectomy right pelvic lymph node biopsy on 12/19/2011 h  . Laparoscopic diverted colostomy  N/A 09/23/2014    Procedure: LAPAROSCOPIC END COLOSTOMY MUCOUS FISTULA, BIOPSY OF VAGINAL TUMOR;  Surgeon: Alphonsa Overall, MD;  Location: [facility];  Service: General;  Laterality: N/A;   Family History  Problem Relation Age of Onset  . Heart disease Mother   . Heart attack Mother   . Diabetes Mother   . Stomach cancer Maternal Grandmother   . Colon cancer Neg Hx   . Esophageal cancer Neg Hx   . Rectal cancer Neg Hx    Social History  Substance Use Topics  . Smoking status: Former Smoker -- 0.50 packs/day for 8 years    Quit date: 12/14/1985  . Smokeless tobacco: Never Used  . Alcohol Use: No   OB History    No data available      Review of Systems  Constitutional: Positive for appetite change. Negative for fever, chills, activity change and fatigue.       Reports decreased appetite today.  Respiratory: Negative for shortness of breath.   Cardiovascular: Negative for chest pain.  Gastrointestinal: Positive for nausea, vomiting and diarrhea. Negative for abdominal pain, constipation and abdominal distention.  Genitourinary: Negative for dysuria, urgency and frequency.  Musculoskeletal: Negative for myalgias, back pain, arthralgias, neck pain and neck stiffness.  Neurological: Negative for dizziness, syncope, weakness, light-headedness, numbness and headaches.  All other systems reviewed and are negative.  Allergies  Review of patient's allergies indicates no known allergies.  Home Medications   Prior to Admission medications   Medication Sig Start Date End Date Taking? Authorizing Provider  amLODipine (NORVASC) 2.5 MG tablet Take 2.5 mg by mouth every morning.    Yes Historical Provider, MD  cholecalciferol (VITAMIN D) 1000 UNITS tablet Take 1,000 Units by mouth daily.   Yes Historical Provider, MD  docusate sodium (COLACE) 100 MG capsule Take 100 mg by mouth daily.    Yes Historical Provider, MD  Fe Fum-FA-B Cmp-C-Zn-Mg-Mn-Cu (HEMATINIC PLUS COMPLEX PO) Take 1 tablet by  mouth daily.   Yes Historical Provider, MD  fentaNYL (DURAGESIC) 100 MCG/HR Use 100 mcg patch with 75 mcg patch for a total dose of 175 mcg every 72 hrs for pain 07/08/15  Yes Lennis Marion Downer, MD  ferrous fumarate (HEMOCYTE - 106 MG FE) 325 (106 FE) MG TABS tablet Take 1 tablet (106 mg of iron total) by mouth daily. Take on empty stomach with OJ 07/05/15  Yes Lennis P Livesay, MD  gabapentin (NEURONTIN) 100 MG capsule TAKE TWO CAPSULES THREE TIMES DAILY 03/15/15  Yes Lennis Marion Downer, MD  gabapentin (NEURONTIN) 300 MG capsule Take 300 mg by mouth 3 (three) times daily.   Yes Historical Provider, MD  megestrol (MEGACE) 400 MG/10ML suspension Take 10 mLs (400 mg total) by mouth 2 (two) times daily. 11/16/14  Yes Lennis Marion Downer, MD  metoprolol (LOPRESSOR) 50 MG tablet Take 50 mg by mouth every morning.    Yes Historical Provider, MD  Multiple Vitamin (MULTIVITAMIN WITH MINERALS) TABS Take 1 tablet by mouth every morning. She takes Dance movement psychotherapist.   Yes Historical Provider, MD  naproxen sodium (ANAPROX) 220 MG tablet Take 220 mg by mouth 3 (three) times daily.    Yes Historical Provider, MD  Oxycodone HCl 10 MG TABS Take 1-2 tablets by mouth every 3-4 hours as needed for pain. 07/08/15  Yes Lennis Marion Downer, MD  fentaNYL (DURAGESIC - DOSED MCG/HR) 75 MCG/HR Use 75 mcg patch in addition to 100 mcg for a total dose of 175 mcg every 72 hours. Patient not taking: Reported on 07/29/2015 07/08/15   Gordy Levan, MD  lidocaine-prilocaine (EMLA) cream Apply topically as needed. 05/20/13   Consuela Mimes, MD  LORazepam (ATIVAN) 0.5 MG tablet 1 tablet under tongue or by mouth every 6 hr as needed foe nausea. Will make drowsy Patient not taking: Reported on 11/16/2014 10/22/14   Lennis P [name], MD  ondansetron (ZOFRAN) 8 MG tablet Take 1 tablet (8 mg total) by mouth every 8 (eight) hours as needed for nausea or vomiting. Will not make drowsy Patient not taking: Reported on 07/08/2015 10/22/14   Gordy Levan, MD   tamoxifen (NOLVADEX) 20 MG tablet Take twice a day for 21 days every 6 weeks, alternating with Megace for 21 days Patient not taking: Reported on 07/29/2015 04/15/15   Lennis P Livesay, MD    BP 127/62 mmHg  Pulse 81  Temp(Src) 98.3 F (36.8 C) (Oral)  Resp 17  SpO2 97% Physical Exam  Constitutional: She is oriented to person, place, and time. No distress.  Chronically ill-appearing female in no acute distress.  HENT:  Head: Normocephalic and atraumatic.  Right Ear: External ear normal.  Left Ear: External ear normal.  Nose: Nose normal.  Mouth/Throat: Uvula is midline and mucous membranes are normal.  Mucous membranes appear dry.  Eyes: Conjunctivae, EOM and lids are normal. Pupils are equal, round, and reactive  to light. Right eye exhibits no discharge. Left eye exhibits no discharge. No scleral icterus.  Neck: Normal range of motion. Neck supple.  Cardiovascular: Normal rate, regular rhythm, normal heart sounds, intact distal pulses and normal pulses.   Pulmonary/Chest: Effort normal and breath sounds normal. No respiratory distress. She has no wheezes. She has no rales.  Abdominal: Soft. Normal appearance and bowel sounds are normal. She exhibits no distension and no mass. There is tenderness. There is no rigidity, no rebound and no guarding.  Mild tenderness to palpation of LLQ. Small amount of brown stool in colostomy bag.  Musculoskeletal: Normal range of motion. She exhibits no edema or tenderness.  Neurological: She is alert and oriented to person, place, and time. She has normal strength. No cranial nerve deficit or sensory deficit.  Skin: Skin is warm, dry and intact. No rash noted. She is not diaphoretic. No erythema. No pallor.  Psychiatric: She has a normal mood and affect. Her speech is normal and behavior is normal. Judgment and thought content normal.  Nursing note and vitals reviewed.   ED Course  Procedures (including critical care time)  Labs Review Labs  Reviewed  CBC WITH DIFFERENTIAL/PLATELET - Abnormal; Notable for the following:    WBC 15.8 (*)    RBC 3.33 (*)    Hemoglobin 10.1 (*)    HCT 31.8 (*)    Neutro Abs 14.0 (*)    All other components within normal limits  COMPREHENSIVE METABOLIC PANEL - Abnormal; Notable for the following:    Glucose, Bld 153 (*)    BUN 23 (*)    Creatinine, Ser 2.00 (*)    Albumin 3.2 (*)    ALT 12 (*)    GFR calc non Af Amer 24 (*)    GFR calc Af Amer 28 (*)    All other components within normal limits  LIPASE, BLOOD - Abnormal; Notable for the following:    Lipase 13 (*)    All other components within normal limits  URINALYSIS, ROUTINE W REFLEX MICROSCOPIC (NOT AT [facility]) - Abnormal; Notable for the following:    Color, Urine AMBER (*)    APPearance TURBID (*)    Hgb urine dipstick MODERATE (*)    Bilirubin Urine SMALL (*)    Protein, ur 100 (*)    Leukocytes, UA MODERATE (*)    All other components within normal limits  URINE MICROSCOPIC-ADD ON - Abnormal; Notable for the following:    Squamous Epithelial / LPF FEW (*)    Bacteria, UA MANY (*)    Casts GRANULAR CAST (*)    All other components within normal limits  I-STAT CG4 LACTIC ACID, ED  I-STAT TROPOININ, ED  I-STAT CG4 LACTIC ACID, ED    Imaging Review Dg Abd Acute W/chest  07/30/2015   CLINICAL DATA:  69 year old female with abdominal pain  EXAM: DG ABDOMEN ACUTE W/ 1V CHEST  COMPARISON:  CT dated 03/24/2015 and radiograph dated 08/19/2014  FINDINGS: Right pectoral infusion catheter with tip at the cavoatrial junction. The lungs are clear. No pleural effusion or pneumothorax. The cardiac silhouette is within normal limits.  Multiple normal caliber loops of bowel noted throughout the abdomen which may represent a degree of ileus. An early small-bowel obstruction is less likely but not excluded. Clinical correlation and follow-up recommended. A left lower quadrant ostomy is noted. There is degenerative changes of the spine with lower  lumbar posterior fixation hardware.  IMPRESSION: Nondilated air-filled loops of small bowel throughout the abdomen  may represent a degree of ileus. Clinical correlation is recommended.   Electronically Signed   By: Anner Crete M.D.   On: 07/30/2015 02:41     I have personally reviewed and evaluated these images and lab results as part of my medical decision-making.   EKG Interpretation None      MDM   Final diagnoses:  Abdominal pain  UTI (lower urinary tract infection)    69 year old female presents with emesis. Reports colostomy output consisted of loose stools today. Denies hematochezia, melena, fever, chills, headache, lightheadedness, dizziness, syncope, chest pain, shortness of breath.  Patient is afebrile. Vital signs stable. Mucous membranes dry. Heart RRR. Lungs clear to auscultation. Abdomen soft, non-distended; mild TTP of LLQ with no rebound, guarding, or masses. Colostomy bag with small amount of brown stool.   CBC with leukocytosis of 15.8, which appears chronic and stable. Lactic acid within normal limits. Creatinine elevated at 2. Lipase within normal limits. UA with moderate leukocytes and TNTC WBC on microscopic, consistent with UTI. EKG no acute ischemia. Troponin negative x 1. Imaging of abdomen with nondilated air-filled loops of small bowel, which may represent ileus.  Patient treated with fluids and ceftriaxone in the ED. Given AKI in the setting of UTI, hospitalist consulted for admission. Spoke with hospitalist, who will admit the patient for further evaluation and management.  BP 131/60 mmHg  Pulse 92  Temp(Src) 98.3 F (36.8 C) (Oral)  Resp 17  SpO2 100%         Marella Chimes, PA-C 07/30/15 0343  Harvel Quale, MD 07/30/15 [phone]

## 2015-07-30 ENCOUNTER — Inpatient Hospital Stay (HOSPITAL_COMMUNITY): Payer: Medicare Other

## 2015-07-30 ENCOUNTER — Encounter (HOSPITAL_COMMUNITY): Payer: Self-pay | Admitting: Internal Medicine

## 2015-07-30 ENCOUNTER — Other Ambulatory Visit (HOSPITAL_COMMUNITY): Payer: Medicare Other

## 2015-07-30 ENCOUNTER — Emergency Department (HOSPITAL_COMMUNITY): Payer: Medicare Other

## 2015-07-30 DIAGNOSIS — R58 Hemorrhage, not elsewhere classified: Secondary | ICD-10-CM | POA: Diagnosis present

## 2015-07-30 DIAGNOSIS — N179 Acute kidney failure, unspecified: Secondary | ICD-10-CM | POA: Diagnosis present

## 2015-07-30 DIAGNOSIS — I10 Essential (primary) hypertension: Secondary | ICD-10-CM

## 2015-07-30 DIAGNOSIS — I129 Hypertensive chronic kidney disease with stage 1 through stage 4 chronic kidney disease, or unspecified chronic kidney disease: Secondary | ICD-10-CM | POA: Diagnosis present

## 2015-07-30 DIAGNOSIS — E119 Type 2 diabetes mellitus without complications: Secondary | ICD-10-CM | POA: Diagnosis present

## 2015-07-30 DIAGNOSIS — H409 Unspecified glaucoma: Secondary | ICD-10-CM | POA: Diagnosis present

## 2015-07-30 DIAGNOSIS — Z9221 Personal history of antineoplastic chemotherapy: Secondary | ICD-10-CM | POA: Diagnosis not present

## 2015-07-30 DIAGNOSIS — Z933 Colostomy status: Secondary | ICD-10-CM | POA: Diagnosis not present

## 2015-07-30 DIAGNOSIS — D638 Anemia in other chronic diseases classified elsewhere: Secondary | ICD-10-CM | POA: Diagnosis not present

## 2015-07-30 DIAGNOSIS — R1110 Vomiting, unspecified: Secondary | ICD-10-CM | POA: Diagnosis not present

## 2015-07-30 DIAGNOSIS — E860 Dehydration: Secondary | ICD-10-CM | POA: Diagnosis present

## 2015-07-30 DIAGNOSIS — Z923 Personal history of irradiation: Secondary | ICD-10-CM | POA: Diagnosis not present

## 2015-07-30 DIAGNOSIS — C569 Malignant neoplasm of unspecified ovary: Secondary | ICD-10-CM | POA: Diagnosis present

## 2015-07-30 DIAGNOSIS — Z800 Family history of malignant neoplasm of digestive organs: Secondary | ICD-10-CM | POA: Diagnosis not present

## 2015-07-30 DIAGNOSIS — Z8249 Family history of ischemic heart disease and other diseases of the circulatory system: Secondary | ICD-10-CM | POA: Diagnosis not present

## 2015-07-30 DIAGNOSIS — N189 Chronic kidney disease, unspecified: Secondary | ICD-10-CM | POA: Diagnosis present

## 2015-07-30 DIAGNOSIS — A419 Sepsis, unspecified organism: Secondary | ICD-10-CM | POA: Diagnosis present

## 2015-07-30 DIAGNOSIS — Z833 Family history of diabetes mellitus: Secondary | ICD-10-CM | POA: Diagnosis not present

## 2015-07-30 DIAGNOSIS — R112 Nausea with vomiting, unspecified: Secondary | ICD-10-CM

## 2015-07-30 DIAGNOSIS — E43 Unspecified severe protein-calorie malnutrition: Secondary | ICD-10-CM | POA: Diagnosis present

## 2015-07-30 DIAGNOSIS — D630 Anemia in neoplastic disease: Secondary | ICD-10-CM | POA: Diagnosis present

## 2015-07-30 DIAGNOSIS — Z66 Do not resuscitate: Secondary | ICD-10-CM | POA: Diagnosis present

## 2015-07-30 DIAGNOSIS — N390 Urinary tract infection, site not specified: Secondary | ICD-10-CM | POA: Diagnosis present

## 2015-07-30 DIAGNOSIS — Z87891 Personal history of nicotine dependence: Secondary | ICD-10-CM | POA: Diagnosis not present

## 2015-07-30 DIAGNOSIS — N1330 Unspecified hydronephrosis: Secondary | ICD-10-CM | POA: Diagnosis present

## 2015-07-30 DIAGNOSIS — N178 Other acute kidney failure: Secondary | ICD-10-CM | POA: Diagnosis not present

## 2015-07-30 DIAGNOSIS — J181 Lobar pneumonia, unspecified organism: Secondary | ICD-10-CM | POA: Diagnosis present

## 2015-07-30 DIAGNOSIS — Z8542 Personal history of malignant neoplasm of other parts of uterus: Secondary | ICD-10-CM | POA: Diagnosis not present

## 2015-07-30 DIAGNOSIS — Z6823 Body mass index (BMI) 23.0-23.9, adult: Secondary | ICD-10-CM | POA: Diagnosis not present

## 2015-07-30 DIAGNOSIS — R630 Anorexia: Secondary | ICD-10-CM | POA: Diagnosis not present

## 2015-07-30 DIAGNOSIS — Z87442 Personal history of urinary calculi: Secondary | ICD-10-CM | POA: Diagnosis not present

## 2015-07-30 DIAGNOSIS — K565 Intestinal adhesions [bands] with obstruction (postprocedural) (postinfection): Secondary | ICD-10-CM | POA: Diagnosis present

## 2015-07-30 DIAGNOSIS — R1013 Epigastric pain: Secondary | ICD-10-CM | POA: Diagnosis not present

## 2015-07-30 LAB — URINE MICROSCOPIC-ADD ON

## 2015-07-30 LAB — COMPREHENSIVE METABOLIC PANEL
ALBUMIN: 2.8 g/dL — AB (ref 3.5–5.0)
ALT: 16 U/L (ref 14–54)
ANION GAP: 10 (ref 5–15)
AST: 28 U/L (ref 15–41)
Alkaline Phosphatase: 72 U/L (ref 38–126)
BILIRUBIN TOTAL: 0.6 mg/dL (ref 0.3–1.2)
BUN: 25 mg/dL — AB (ref 6–20)
CHLORIDE: 107 mmol/L (ref 101–111)
CO2: 25 mmol/L (ref 22–32)
Calcium: 9 mg/dL (ref 8.9–10.3)
Creatinine, Ser: 1.84 mg/dL — ABNORMAL HIGH (ref 0.44–1.00)
GFR calc Af Amer: 31 mL/min — ABNORMAL LOW (ref >60)
GFR calc non Af Amer: 27 mL/min — ABNORMAL LOW (ref >60)
GLUCOSE: 157 mg/dL — AB (ref 65–99)
POTASSIUM: 4 mmol/L (ref 3.5–5.1)
SODIUM: 142 mmol/L (ref 135–145)
TOTAL PROTEIN: 7 g/dL (ref 6.5–8.1)

## 2015-07-30 LAB — URINALYSIS, ROUTINE W REFLEX MICROSCOPIC
GLUCOSE, UA: NEGATIVE mg/dL
KETONES UR: NEGATIVE mg/dL
NITRITE: NEGATIVE
PROTEIN: 100 mg/dL — AB
Specific Gravity, Urine: 1.025 (ref 1.005–1.030)
UROBILINOGEN UA: 0.2 mg/dL (ref 0.0–1.0)
pH: 5 (ref 5.0–8.0)

## 2015-07-30 LAB — CBC WITH DIFFERENTIAL/PLATELET
BASOS ABS: 0 10*3/uL (ref 0.0–0.1)
Basophils Relative: 0 %
EOS PCT: 0 %
Eosinophils Absolute: 0 10*3/uL (ref 0.0–0.7)
HEMATOCRIT: 30.7 % — AB (ref 36.0–46.0)
HEMOGLOBIN: 9.8 g/dL — AB (ref 12.0–15.0)
LYMPHS ABS: 0.9 10*3/uL (ref 0.7–4.0)
LYMPHS PCT: 8 %
MCH: 30.5 pg (ref 26.0–34.0)
MCHC: 31.9 g/dL (ref 30.0–36.0)
MCV: 95.6 fL (ref 78.0–100.0)
MONOS PCT: 7 %
Monocytes Absolute: 0.8 10*3/uL (ref 0.1–1.0)
NEUTROS ABS: 9.4 10*3/uL — AB (ref 1.7–7.7)
Neutrophils Relative %: 85 %
Platelets: 265 10*3/uL (ref 150–400)
RBC: 3.21 MIL/uL — ABNORMAL LOW (ref 3.87–5.11)
RDW: 14.3 % (ref 11.5–15.5)
WBC: 11.1 10*3/uL — ABNORMAL HIGH (ref 4.0–10.5)

## 2015-07-30 LAB — I-STAT CG4 LACTIC ACID, ED: Lactic Acid, Venous: 0.78 mmol/L (ref 0.5–2.0)

## 2015-07-30 LAB — GLUCOSE, CAPILLARY
Glucose-Capillary: 133 mg/dL — ABNORMAL HIGH (ref 65–99)
Glucose-Capillary: 127 mg/dL — ABNORMAL HIGH (ref 65–99)

## 2015-07-30 LAB — I-STAT TROPONIN, ED: Troponin i, poc: 0.01 ng/mL (ref 0.00–0.08)

## 2015-07-30 MED ORDER — METOPROLOL TARTRATE 50 MG PO TABS
50.0000 mg | ORAL_TABLET | Freq: Every morning | ORAL | Status: DC
  Administered 2015-07-30 – 2015-08-02 (×4): 50 mg via ORAL
  Filled 2015-07-30 (×4): qty 1

## 2015-07-30 MED ORDER — OXYCODONE HCL 5 MG PO TABS
10.0000 mg | ORAL_TABLET | ORAL | Status: DC | PRN
  Administered 2015-07-30 – 2015-08-02 (×13): 10 mg via ORAL
  Filled 2015-07-30 (×13): qty 2

## 2015-07-30 MED ORDER — FENTANYL 50 MCG/HR TD PT72
175.0000 ug | MEDICATED_PATCH | TRANSDERMAL | Status: DC
  Administered 2015-07-31: 175 ug via TRANSDERMAL
  Filled 2015-07-30 (×3): qty 1

## 2015-07-30 MED ORDER — ONDANSETRON HCL 4 MG PO TABS: 4.0000 mg | ORAL_TABLET | Freq: Four times a day (QID) | ORAL | Status: DC | PRN

## 2015-07-30 MED ORDER — ADULT MULTIVITAMIN W/MINERALS CH
1.0000 | ORAL_TABLET | Freq: Every day | ORAL | Status: DC
  Administered 2015-07-30 – 2015-08-01 (×3): 1 via ORAL
  Filled 2015-07-30 (×4): qty 1

## 2015-07-30 MED ORDER — DEXTROSE 5 % IV SOLN: 1.0000 g | INTRAVENOUS | Status: DC

## 2015-07-30 MED ORDER — HYDROMORPHONE HCL 1 MG/ML IJ SOLN
1.0000 mg | Freq: Once | INTRAMUSCULAR | Status: AC
  Administered 2015-07-30: 1 mg via INTRAVENOUS
  Filled 2015-07-30: qty 1

## 2015-07-30 MED ORDER — HYDRALAZINE HCL 20 MG/ML IJ SOLN: 10.0000 mg | INTRAMUSCULAR | Status: DC | PRN

## 2015-07-30 MED ORDER — AMLODIPINE BESYLATE 2.5 MG PO TABS
2.5000 mg | ORAL_TABLET | Freq: Every morning | ORAL | Status: DC
  Administered 2015-07-30 – 2015-08-02 (×4): 2.5 mg via ORAL
  Filled 2015-07-30 (×4): qty 1

## 2015-07-30 MED ORDER — PROMETHAZINE HCL 25 MG/ML IJ SOLN: 12.5000 mg | Freq: Four times a day (QID) | INTRAMUSCULAR | Status: DC | PRN

## 2015-07-30 MED ORDER — ACETAMINOPHEN 650 MG RE SUPP: 650.0000 mg | Freq: Four times a day (QID) | RECTAL | Status: DC | PRN

## 2015-07-30 MED ORDER — SODIUM CHLORIDE 0.9 % IV SOLN
INTRAVENOUS | Status: AC
  Administered 2015-07-30 (×2): via INTRAVENOUS
  Administered 2015-07-31: 100 mL/h via INTRAVENOUS

## 2015-07-30 MED ORDER — LEVOFLOXACIN IN D5W 500 MG/100ML IV SOLN
500.0000 mg | INTRAVENOUS | Status: DC
  Administered 2015-08-01: 500 mg via INTRAVENOUS
  Filled 2015-07-30: qty 100

## 2015-07-30 MED ORDER — IOHEXOL 300 MG/ML  SOLN: 25.0000 mL | Freq: Once | INTRAMUSCULAR | Status: DC | PRN

## 2015-07-30 MED ORDER — ENOXAPARIN SODIUM 30 MG/0.3ML ~~LOC~~ SOLN
30.0000 mg | Freq: Every day | SUBCUTANEOUS | Status: DC
  Administered 2015-07-30 – 2015-07-31 (×2): 30 mg via SUBCUTANEOUS
  Filled 2015-07-30 (×2): qty 0.3

## 2015-07-30 MED ORDER — GABAPENTIN 300 MG PO CAPS
300.0000 mg | ORAL_CAPSULE | Freq: Three times a day (TID) | ORAL | Status: DC
  Administered 2015-07-30 – 2015-08-02 (×10): 300 mg via ORAL
  Filled 2015-07-30 (×12): qty 1

## 2015-07-30 MED ORDER — DOCUSATE SODIUM 100 MG PO CAPS
100.0000 mg | ORAL_CAPSULE | Freq: Every day | ORAL | Status: DC
  Administered 2015-07-31 – 2015-08-02 (×3): 100 mg via ORAL

## 2015-07-30 MED ORDER — MEGESTROL ACETATE 400 MG/10ML PO SUSP
400.0000 mg | Freq: Two times a day (BID) | ORAL | Status: DC
  Administered 2015-07-30 – 2015-08-02 (×6): 400 mg via ORAL
  Filled 2015-07-30 (×8): qty 10

## 2015-07-30 MED ORDER — HYDROMORPHONE HCL 2 MG/ML IJ SOLN
2.0000 mg | Freq: Once | INTRAMUSCULAR | Status: AC
  Administered 2015-07-30: 2 mg via INTRAVENOUS
  Filled 2015-07-30: qty 1

## 2015-07-30 MED ORDER — ACETAMINOPHEN 325 MG PO TABS
650.0000 mg | ORAL_TABLET | Freq: Four times a day (QID) | ORAL | Status: DC | PRN
Start: 2015-07-30 — End: 2015-08-02
  Administered 2015-07-30 – 2015-08-02 (×9): 650 mg via ORAL
  Filled 2015-07-30 (×9): qty 2

## 2015-07-30 MED ORDER — DEXTROSE 5 % IV SOLN
1.0000 g | Freq: Once | INTRAVENOUS | Status: AC
  Administered 2015-07-30: 1 g via INTRAVENOUS
  Filled 2015-07-30: qty 10

## 2015-07-30 MED ORDER — ONDANSETRON HCL 4 MG/2ML IJ SOLN
4.0000 mg | Freq: Four times a day (QID) | INTRAMUSCULAR | Status: DC | PRN
  Administered 2015-07-30: 4 mg via INTRAVENOUS
  Filled 2015-07-30: qty 2

## 2015-07-30 MED ORDER — LEVOFLOXACIN IN D5W 750 MG/150ML IV SOLN
750.0000 mg | Freq: Once | INTRAVENOUS | Status: AC
  Administered 2015-07-30: 750 mg via INTRAVENOUS
  Filled 2015-07-30: qty 150

## 2015-07-30 MED ORDER — FERROUS FUMARATE 325 (106 FE) MG PO TABS
1.0000 | ORAL_TABLET | Freq: Every day | ORAL | Status: DC
  Administered 2015-07-31: 106 mg via ORAL
  Administered 2015-08-01: 11:00:00 via ORAL
  Administered 2015-08-02: 106 mg via ORAL
  Filled 2015-07-30 (×4): qty 1

## 2015-07-30 NOTE — Consult Note (Signed)
Urology Consult   Physician requesting consult: Mart Piggs   Reason for consult: Bilateral Hydronephrosis  History of Present Illness: Pam Vazquez is a 69 y.o. female with history of endometrial cancer and history of colostomy placement process to the ER because of nausea vomiting since yesterday. During evaluation for ileus/bowel obstruction she was found to have bilateral hydronephrosis and rising creatinine to 2 from 1.1 in August 2016.  Most recent treatment was a palliative chemotherapy for her recurrent endometrial cancer.   ONCOLOGIC HISTORY Patient had robotic assisted total laparoscopic hysterectomy bilateral salpingo-oophorectomy right pelvic lymph node biopsy on 12/19/2011. 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 There was no involvement of other organs lymphovascular invasion was not identified. 3 lymph nodes were negative for metastatic disease. 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  Patient was seen by Dr. [name] on 04/29/2013 with a nodule in the vagina concerning for recurrence as well as 8 cm rectal mass. CT of the abdomen and pelvis showed a lobular mass at the vagina cuff measuring 7.0 x 3.2 cm proximally 4 cm craniocaudal dimension, with compression of the distal aspect of the sigmoid colon just above the rectum . There was also noted to be a large necrotic lymph node measuring 2.8 cm within the sigmoid mesocolon centrally. There are also small bilateral external iliac lymph nodes. There were no aggressive osseous lesions. 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  Patient received 6 cycles of taxol carboplatin from 06/03/2013 thru 03/47//4259, complicated by  some peripheral neuropathy. There was an interval decrease in tumor burden therefore Dr. [name] recommended 3 additional cycles of Taxotere/Carboplatin, received from 11/27/13 through 01/15/14.  CT of the chest, abdomen, and pelvis on 02/10/14 demonstrated disease progression. She had radiation by Dr Sondra Come, [place] [facility] [place] given from 02/23/14 thru 03-27-14. PET September 2015 had increased uptake in central pelvis involving the sigmoid colon, with rectovaginal mass confirmed on exam by gyn oncology. Patient declined total pelvic exenteration. Colonoscopy 09-08-14 confirmed near complete rectal obstruction from external mass. She had diverting colostomy by Dr [name] 09-24-14, no disease visualized outside of pelvis. She had 4 cycles of doxil from 11-03-14 thru 01-26-15, tolerated well. CT AP 02-15-15 had no disease outside of pelvis and some increase in the involved areas at vaginal cuff and right iliacus compared with scans from July and Sept 2015. She completed 6 cycles of doxil on 03-22-15. CT AP 03-24-15 was stable compared with the month prior.She began alternating megace with tamoxifen every 3 weeks on 04-15-15.   Past Medical History  Diagnosis Date  . Angina     stress test on chart from 3/12/ none since March 2012  . Chronic kidney disease     kidney stones  . Hypertension     / EKG 1/13 Epic  . Radiation 02/23/14-03/27/14    45 gray to pelvic mass  . Glaucoma   . Cancer     endometrial- done with chemo and radiation  . Diabetes mellitus     no meds now    Past Surgical History  Procedure Laterality Date  . Hysteroscopy w/d&c  11/21/2011    Procedure: DILATATION AND CURETTAGE /HYSTEROSCOPY;  Surgeon: Gus Height;  Location: [facility];  Service: Gynecology;  Laterality: N/A;  . Tubal ligation    .  Breast mass removal  1984, 1992    L breast  . Lithotripsy  2000, 2002  . Back surgery      91 and 2001  . Dilation and curettage of uterus    . Node dissection  12/19/2011    Procedure: NODE DISSECTION;   Surgeon: [name], MD PHD;  Location: [facility];  Service: Gynecology;  Laterality: N/A;  . Robotic assisted total hysterectomy with bilateral salpingo oopherectomy  12/19/2011    robotic-assisted total laparoscopic hysterectomy bilateral salpingo-oophorectomy right pelvic lymph node biopsy on 12/19/2011 h  . Laparoscopic diverted colostomy N/A 09/23/2014    Procedure: LAPAROSCOPIC END COLOSTOMY MUCOUS FISTULA, BIOPSY OF VAGINAL TUMOR;  Surgeon: Alphonsa Overall, MD;  Location: [facility];  Service: General;  Laterality: N/A;     Current Hospital Medications:  Home meds:    Medication List    ASK your doctor about these medications        amLODipine 2.5 MG tablet  Commonly known as:  NORVASC  Take 2.5 mg by mouth every morning.     cholecalciferol 1000 UNITS tablet  Commonly known as:  VITAMIN D  Take 1,000 Units by mouth daily.     docusate sodium 100 MG capsule  Commonly known as:  COLACE  Take 100 mg by mouth daily.     fentaNYL 75 MCG/HR  Commonly known as:  DURAGESIC - dosed mcg/hr  Use 75 mcg patch in addition to 100 mcg for a total dose of 175 mcg every 72 hours.     fentaNYL 100 MCG/HR  Commonly known as:  DURAGESIC  Use 100 mcg patch with 75 mcg patch for a total dose of 175 mcg every 72 hrs for pain     ferrous fumarate 325 (106 FE) MG Tabs tablet  Commonly known as:  HEMOCYTE - 106 mg FE  Take 1 tablet (106 mg of iron total) by mouth daily. Take on empty stomach with OJ     gabapentin 300 MG capsule  Commonly known as:  NEURONTIN  Take 300 mg by mouth 3 (three) times daily.     gabapentin 100 MG capsule  Commonly known as:  NEURONTIN  TAKE TWO CAPSULES THREE TIMES DAILY     HEMATINIC PLUS COMPLEX PO  Take 1 tablet by mouth daily.     lidocaine-prilocaine cream  Commonly known as:  EMLA  Apply topically as needed.     LORazepam 0.5 MG tablet  Commonly known as:  ATIVAN  1 tablet under tongue or by mouth every 6 hr as needed foe nausea. Will make drowsy      megestrol 400 MG/10ML suspension  Commonly known as:  MEGACE  Take 10 mLs (400 mg total) by mouth 2 (two) times daily.     metoprolol 50 MG tablet  Commonly known as:  LOPRESSOR  Take 50 mg by mouth every morning.     multivitamin with minerals Tabs tablet  Take 1 tablet by mouth every morning. She takes Dance movement psychotherapist.     naproxen sodium 220 MG tablet  Commonly known as:  ANAPROX  Take 220 mg by mouth 3 (three) times daily.     ondansetron 8 MG tablet  Commonly known as:  ZOFRAN  Take 1 tablet (8 mg total) by mouth every 8 (eight) hours as needed for nausea or vomiting. Will not make drowsy     Oxycodone HCl 10 MG Tabs  Take 1-2 tablets by mouth every 3-4 hours as needed for pain.  tamoxifen 20 MG tablet  Commonly known as:  NOLVADEX  Take twice a day for 21 days every 6 weeks, alternating with Megace for 21 days        Scheduled Meds: . amLODipine  2.5 mg Oral q morning - 10a  . docusate sodium  100 mg Oral Daily  . enoxaparin (LOVENOX) injection  30 mg Subcutaneous Daily  . [START ON 07/31/2015] fentaNYL  175 mcg Transdermal Q72H  . ferrous fumarate  1 tablet Oral Daily  . gabapentin  300 mg Oral TID  . [START ON 08/01/2015] levofloxacin (LEVAQUIN) IV  500 mg Intravenous Q48H  . levofloxacin (LEVAQUIN) IV  750 mg Intravenous Once  . megestrol  400 mg Oral BID  . metoprolol  50 mg Oral q morning - 10a  . multivitamin with minerals  1 tablet Oral QHS   Continuous Infusions: . sodium chloride 100 mL/hr at 07/30/15 1024   PRN Meds:.acetaminophen **OR** acetaminophen, hydrALAZINE, iohexol, ondansetron **OR** ondansetron (ZOFRAN) IV, oxyCODONE, promethazine  Allergies: No Known Allergies  Family History  Problem Relation Age of Onset  . Heart disease Mother   . Heart attack Mother   . Diabetes Mother   . Stomach cancer Maternal Grandmother   . Colon cancer Neg Hx   . Esophageal cancer Neg Hx   . Rectal cancer Neg Hx     Social History:  reports that she  quit smoking about 29 years ago. She has never used smokeless tobacco. She reports that she does not drink alcohol or use illicit drugs.  ROS: A complete review of systems was performed.  All systems are negative except for pertinent findings as noted.  Physical Exam:  Vital signs in last 24 hours: Temp:  [97.6 F (36.4 C)-98.3 F (36.8 C)] 97.6 F (36.4 C) (09/16 1400) Pulse Rate:  [81-109] 85 (09/16 1400) Resp:  [16-18] 16 (09/16 1400) BP: (120-148)/(59-78) 134/60 mmHg (09/16 1400) SpO2:  [96 %-100 %] 98 % (09/16 1400) Weight:  [55.29 kg (121 lb 14.3 oz)] 55.29 kg (121 lb 14.3 oz) (09/16 1005) HEENT:PERRL, sclerae not icteric. Oral mucosa moist without lesions, posterior pharynx clear.  Neck supple. No JVD.  Resp: NWOB Cardio: Pulse regular rate and rhythm. No gallop. GI: soft, nontender, not distended,  Some liquid and soft stool in ostomy bag. 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 Otherwise nonfocal  Laboratory Data:   Recent Labs  07/29/15 2220 07/30/15 1035  WBC 15.8* 11.1*  HGB 10.1* 9.8*  HCT 31.8* 30.7*  PLT 313 265     Recent Labs  07/29/15 2220 07/30/15 1035  NA 143 142  K 3.7 4.0  CL 107 107  GLUCOSE 153* 157*  BUN 23* 25*  CALCIUM 9.2 9.0  CREATININE 2.00* 1.84*     Results for orders placed or performed during the hospital encounter of 07/29/15 (from the past 24 hour(s))  CBC with Differential     Status: Abnormal   Collection Time: 07/29/15 10:20 PM  Result Value Ref Range   WBC 15.8 (H) 4.0 - 10.5 K/uL   RBC 3.33 (L) 3.87 - 5.11 MIL/uL   Hemoglobin 10.1 (L) 12.0 - 15.0 g/dL   HCT 31.8 (L) 36.0 - 46.0 %   MCV 95.5 78.0 - 100.0 fL   MCH 30.3 26.0 - 34.0 pg   MCHC 31.8 30.0 - 36.0 g/dL   RDW 14.2 11.5 - 15.5 %  Platelets 313 150 - 400 K/uL   Neutrophils Relative % 88 %   Lymphocytes Relative 6 %   Monocytes  Relative 6 %   Eosinophils Relative 0 %   Basophils Relative 0 %   Neutro Abs 14.0 (H) 1.7 - 7.7 K/uL   Lymphs Abs 0.9 0.7 - 4.0 K/uL   Monocytes Absolute 0.9 0.1 - 1.0 K/uL   Eosinophils Absolute 0.0 0.0 - 0.7 K/uL   Basophils Absolute 0.0 0.0 - 0.1 K/uL   WBC Morphology DOHLE BODIES    Smear Review PLATELET CLUMPS NOTED ON SMEAR   Comprehensive metabolic panel     Status: Abnormal   Collection Time: 07/29/15 10:20 PM  Result Value Ref Range   Sodium 143 135 - 145 mmol/L   Potassium 3.7 3.5 - 5.1 mmol/L   Chloride 107 101 - 111 mmol/L   CO2 24 22 - 32 mmol/L   Glucose, Bld 153 (H) 65 - 99 mg/dL   BUN 23 (H) 6 - 20 mg/dL   Creatinine, Ser 2.00 (H) 0.44 - 1.00 mg/dL   Calcium 9.2 8.9 - 10.3 mg/dL   Total Protein 7.6 6.5 - 8.1 g/dL   Albumin 3.2 (L) 3.5 - 5.0 g/dL   AST 25 15 - 41 U/L   ALT 12 (L) 14 - 54 U/L   Alkaline Phosphatase 84 38 - 126 U/L   Total Bilirubin 0.9 0.3 - 1.2 mg/dL   GFR calc non Af Amer 24 (L) >60 mL/min   GFR calc Af Amer 28 (L) >60 mL/min   Anion gap 12 5 - 15  Lipase, blood     Status: Abnormal   Collection Time: 07/29/15 10:20 PM  Result Value Ref Range   Lipase 13 (L) 22 - 51 U/L  I-Stat CG4 Lactic Acid, ED     Status: None   Collection Time: 07/29/15 11:48 PM  Result Value Ref Range   Lactic Acid, Venous 0.79 0.5 - 2.0 mmol/L  Urinalysis, Routine w reflex microscopic (not at [facility])     Status: Abnormal   Collection Time: 07/30/15 12:11 AM  Result Value Ref Range   Color, Urine AMBER (A) YELLOW   APPearance TURBID (A) CLEAR   Specific Gravity, Urine 1.025 1.005 - 1.030   pH 5.0 5.0 - 8.0   Glucose, UA NEGATIVE NEGATIVE mg/dL   Hgb urine dipstick MODERATE (A) NEGATIVE   Bilirubin Urine SMALL (A) NEGATIVE   Ketones, ur NEGATIVE NEGATIVE mg/dL   Protein, ur 100 (A) NEGATIVE mg/dL   Urobilinogen, UA 0.2 0.0 - 1.0 mg/dL   Nitrite NEGATIVE NEGATIVE   Leukocytes, UA MODERATE (A) NEGATIVE  Urine microscopic-add on     Status: Abnormal   Collection  Time: 07/30/15 12:11 AM  Result Value Ref Range   Squamous Epithelial / LPF FEW (A) RARE   WBC, UA TOO NUMEROUS TO COUNT <3 WBC/hpf   RBC / HPF 7-10 <3 RBC/hpf   Bacteria, UA MANY (A) RARE   Casts GRANULAR CAST (A) NEGATIVE  I-stat troponin, ED     Status: None   Collection Time: 07/30/15  1:57 AM  Result Value Ref Range   Troponin i, poc 0.01 0.00 - 0.08 ng/mL   Comment 3          I-Stat CG4 Lactic Acid, ED     Status: None   Collection Time: 07/30/15  1:59 AM  Result Value Ref Range   Lactic Acid, Venous 0.78 0.5 - 2.0 mmol/L  Comprehensive metabolic panel  Status: Abnormal   Collection Time: 07/30/15 10:35 AM  Result Value Ref Range   Sodium 142 135 - 145 mmol/L   Potassium 4.0 3.5 - 5.1 mmol/L   Chloride 107 101 - 111 mmol/L   CO2 25 22 - 32 mmol/L   Glucose, Bld 157 (H) 65 - 99 mg/dL   BUN 25 (H) 6 - 20 mg/dL   Creatinine, Ser 1.84 (H) 0.44 - 1.00 mg/dL   Calcium 9.0 8.9 - 10.3 mg/dL   Total Protein 7.0 6.5 - 8.1 g/dL   Albumin 2.8 (L) 3.5 - 5.0 g/dL   AST 28 15 - 41 U/L   ALT 16 14 - 54 U/L   Alkaline Phosphatase 72 38 - 126 U/L   Total Bilirubin 0.6 0.3 - 1.2 mg/dL   GFR calc non Af Amer 27 (L) >60 mL/min   GFR calc Af Amer 31 (L) >60 mL/min   Anion gap 10 5 - 15  CBC WITH DIFFERENTIAL     Status: Abnormal   Collection Time: 07/30/15 10:35 AM  Result Value Ref Range   WBC 11.1 (H) 4.0 - 10.5 K/uL   RBC 3.21 (L) 3.87 - 5.11 MIL/uL   Hemoglobin 9.8 (L) 12.0 - 15.0 g/dL   HCT 30.7 (L) 36.0 - 46.0 %   MCV 95.6 78.0 - 100.0 fL   MCH 30.5 26.0 - 34.0 pg   MCHC 31.9 30.0 - 36.0 g/dL   RDW 14.3 11.5 - 15.5 %   Platelets 265 150 - 400 K/uL   Neutrophils Relative % 85 %   Lymphocytes Relative 8 %   Monocytes Relative 7 %   Eosinophils Relative 0 %   Basophils Relative 0 %   Neutro Abs 9.4 (H) 1.7 - 7.7 K/uL   Lymphs Abs 0.9 0.7 - 4.0 K/uL   Monocytes Absolute 0.8 0.1 - 1.0 K/uL   Eosinophils Absolute 0.0 0.0 - 0.7 K/uL   Basophils Absolute 0.0 0.0 - 0.1 K/uL    WBC Morphology DOHLE BODIES   Glucose, capillary     Status: Abnormal   Collection Time: 07/30/15 12:23 PM  Result Value Ref Range   Glucose-Capillary 133 (H) 65 - 99 mg/dL   No results found for this or any previous visit (from the past 240 hour(s)).  Renal Function:  Recent Labs  07/29/15 2220 07/30/15 1035  CREATININE 2.00* 1.84*   Estimated Creatinine Clearance: 22.8 mL/min (by C-G formula based on Cr of 1.84).  Radiologic Imaging: Ct Abdomen Pelvis Wo Contrast  07/30/2015   CLINICAL DATA:  "history of endometrial cancer and history of colostomy placement. Nausea vomiting since yesterday. Patient had 3 episodes of nausea vomiting which was nonbloody yesterday and had one episode in the ER  EXAM: CT ABDOMEN AND PELVIS WITHOUT CONTRAST  TECHNIQUE: Multidetector CT imaging of the abdomen and pelvis was performed following the standard protocol without IV contrast.  COMPARISON:  03/24/2015 and earlier studies  FINDINGS: Patchy airspace opacity medially in the right middle lobe, new since previous exam.  Coronary and aortoiliac arterial calcifications.  Unremarkable liver, spleen, adrenal glands. Gallbladder physiologically distended. Diffuse pancreatic parenchymal atrophy. Unenhanced CT was performed per clinician order. Lack of IV contrast limits sensitivity and specificity, especially for evaluation of abdominal/pelvic solid viscera. Chronic patchy right renal parenchymal loss. Bilateral hydronephrosis and ureterectasis, progressive since previous exam. The ureters cannot be followed below the level of the sacroiliac joints.  Lumbar spine fixation hardware results in streak artifact degrading some of the images.  Stomach is distended by ingested material. Multiple dilated small bowel loops. 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 Terminal ileum  however is decompressed. The colon is nondilated. Enlarged uterus. There is pelvic mass at the level of the lower uterine segment which extends nearly to the pelvic sidewalls bilaterally, with central necrotic portions in small gas bubbles. Urinary bladder is nondistended. No ascites. No free air. Subcentimeter left para-aortic and aortocaval lymph nodes.  IMPRESSION: 1. Distal small bowel obstruction, etiology uncertain. 2. New bilateral hydronephrosis and proximal ureterectasis. 3. Patchy medial right middle lobe airspace opacity, possibly early pneumonia but nonspecific. 4. 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   Electronically Signed   By: Lucrezia Europe M.D.   On: 07/30/2015 15:18   Dg Abd Acute W/chest  07/30/2015   CLINICAL DATA:  69 year old female with abdominal pain  EXAM: DG ABDOMEN ACUTE W/ 1V CHEST  COMPARISON:  CT dated 03/24/2015 and radiograph dated 08/19/2014  FINDINGS: Right pectoral infusion catheter with tip at the cavoatrial junction. The lungs are clear. No pleural effusion or pneumothorax. The cardiac silhouette is within normal limits.  Multiple normal caliber loops of bowel noted throughout the abdomen which may represent a degree of ileus. An early small-bowel obstruction is less likely but not excluded. Clinical correlation and follow-up recommended. A left lower quadrant ostomy is noted. There is degenerative changes of the spine with lower lumbar posterior fixation hardware.  IMPRESSION: Nondilated air-filled loops of small bowel throughout the abdomen may represent a degree of ileus. Clinical correlation is recommended.   Electronically Signed   By: Anner Crete M.D.   On: 07/30/2015 02:41    I independently reviewed the above imaging studies.  Impression/Recommendation: 69 y.o. with recurrent endometrial cancer with bilateral hydronephrosis and elevated creatinine. We had a long discussion on options including observation, stents and nephrostomy  tubes. With her creatinine decreasing and the irreversible nature of a decision for stents the patient would like to pursue observation at this time, which is appropriate with her good primary care.   - Continue IV hydration until good PO intake - Monitor creatinine - Strict I&Os - we will continue to follow   Christell Faith 07/30/2015, 5:35 PM  Festus Aloe. MD

## 2015-07-30 NOTE — Progress Notes (Signed)
ANTIBIOTIC CONSULT NOTE - INITIAL  Pharmacy Consult for Levaquin Indication: CAP/UTI  No Known Allergies  Patient Measurements: Height: 5' (152.4 cm) Weight: 121 lb 14.3 oz (55.29 kg) IBW/kg (Calculated) : 45.5   Vital Signs: Temp: 97.6 F (36.4 C) (09/16 1400) Temp Source: Oral (09/16 1400) BP: 134/60 mmHg (09/16 1400) Pulse Rate: 85 (09/16 1400) Intake/Output from previous day:   Intake/Output from this shift:    Labs:  Recent Labs  07/29/15 2220 07/30/15 1035  WBC 15.8* 11.1*  HGB 10.1* 9.8*  PLT 313 265  CREATININE 2.00* 1.84*   Estimated Creatinine Clearance: 22.8 mL/min (by C-G formula based on Cr of 1.84). No results for input(s): VANCOTROUGH, VANCOPEAK, VANCORANDOM, GENTTROUGH, GENTPEAK, GENTRANDOM, TOBRATROUGH, TOBRAPEAK, TOBRARND, AMIKACINPEAK, AMIKACINTROU, AMIKACIN in the last 72 hours.   Microbiology: Recent Results (from the past 720 hour(s))  Urine Culture     Status: None   Collection Time: 07/08/15  2:04 PM  Result Value Ref Range Status   Urine Culture, Routine Culture, Urine  Final    Comment: Final - ===== COLONY COUNT: ===== NO GROWTH NO GROWTH     Medical History: Past Medical History  Diagnosis Date  . Angina     stress test on chart from 3/12/ none since March 2012  . Chronic kidney disease     kidney stones  . Hypertension     / EKG 1/13 Epic  . Radiation 02/23/14-03/27/14    45 gray to pelvic mass  . Glaucoma   . Cancer     endometrial- done with chemo and radiation  . Diabetes mellitus     no meds now    Medications:  Scheduled:  . amLODipine  2.5 mg Oral q morning - 10a  . docusate sodium  100 mg Oral Daily  . enoxaparin (LOVENOX) injection  30 mg Subcutaneous Daily  . [START ON 07/31/2015] fentaNYL  175 mcg Transdermal Q72H  . ferrous fumarate  1 tablet Oral Daily  . gabapentin  300 mg Oral TID  . megestrol  400 mg Oral BID  . metoprolol  50 mg Oral q morning - 10a  . multivitamin with minerals  1 tablet Oral QHS    Infusions:  . sodium chloride 100 mL/hr at 07/30/15 1024   PRN: acetaminophen **OR** acetaminophen, hydrALAZINE, iohexol, ondansetron **OR** ondansetron (ZOFRAN) IV, oxyCODONE, promethazine Assessment: 69yoF with h/o endometrial cancer and colostomy placement seen in the ER today for N/V. Pt admitted for management of N/V, acute renal failure and UTI. Rocephin was started and switching to Levaquin for CAP and UTI.  Goal of Therapy:  Eradication of infection  Plan:  Levaquin 750mg  IV x 1 then 500mg  IV Q48h  Will f/u Scr and CX  Garnet Sierras 07/30/2015,4:08 PM

## 2015-07-30 NOTE — Progress Notes (Signed)
ANTIBIOTIC CONSULT NOTE - INITIAL  Pharmacy Consult for ceftriaxone Indication: UTI  No Known Allergies  Vital Signs: BP: 142/68 mmHg (09/16 0900) Pulse Rate: 87 (09/16 0900) Intake/Output from previous day:   Intake/Output from this shift:    Labs:  Recent Labs  07/29/15 2220  WBC 15.8*  HGB 10.1*  PLT 313  CREATININE 2.00*   CrCl cannot be calculated (Unknown ideal weight.). No results for input(s): VANCOTROUGH, VANCOPEAK, VANCORANDOM, GENTTROUGH, GENTPEAK, GENTRANDOM, TOBRATROUGH, TOBRAPEAK, TOBRARND, AMIKACINPEAK, AMIKACINTROU, AMIKACIN in the last 72 hours.   Microbiology: Recent Results (from the past 720 hour(s))  Urine Culture     Status: None   Collection Time: 07/08/15  2:04 PM  Result Value Ref Range Status   Urine Culture, Routine Culture, Urine  Final    Comment: Final - ===== COLONY COUNT: ===== NO GROWTH NO GROWTH     Medical History: Past Medical History  Diagnosis Date  . Angina     stress test on chart from 3/12/ none since March 2012  . Chronic kidney disease     kidney stones  . Hypertension     / EKG 1/13 Epic  . Radiation 02/23/14-03/27/14    45 gray to pelvic mass  . Glaucoma   . Cancer     endometrial- done with chemo and radiation  . Diabetes mellitus     no meds now    Assessment: 69 y.o. female with history of endometrial cancer and history of colostomy placement process to the ER because of nausea vomiting since yesterday.  UA shows features concerning for UTI.  Goal of Therapy:  Eradication of infection  Plan:   Ceftriaxone 1gm IV q24h  Pharmacy will sign off as current dose is appropriate for indication and does not need renal adjustment  Please re-consult [place] if need further assistance   Dolly Rias RPh 07/30/2015, 9:36 AM Pager [phone]

## 2015-07-30 NOTE — Plan of Care (Signed)
Problem: Consults Goal: General Medical Patient Education See Patient Education Module for specific education. Nausea/ vomiting uti

## 2015-07-30 NOTE — Consult Note (Signed)
Chief Complaint:  Abdominal pain with nausea and vomiting  History of Present Illness:  Pam Vazquez is an 69 y.o. female who was operated on by my partner, Dr. 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  She was treated with a colostomy and mucous fistula.  She was admitted with nausea and vomiting and [facility] was consulted.  Presently there is a scant amount of stool in her ostomy bag.  She is wearing a pad on her mucous fistula.  No NG is in place.    Past Medical History  Diagnosis Date  . Angina     stress test on chart from 3/12/ none since March 2012  . Chronic kidney disease     kidney stones  . Hypertension     / EKG 1/13 Epic  . Radiation 02/23/14-03/27/14    45 gray to pelvic mass  . Glaucoma   . Cancer     endometrial- done with chemo and radiation  . Diabetes mellitus     no meds now    Past Surgical History  Procedure Laterality Date  . Hysteroscopy w/d&c  11/21/2011    Procedure: DILATATION AND CURETTAGE /HYSTEROSCOPY;  Surgeon: Gus Height;  Location: [facility];  Service: Gynecology;  Laterality: N/A;  . Tubal ligation    . Breast mass removal  1984, 1992    L breast  . Lithotripsy  2000, 2002  . Back surgery      91 and 2001  . Dilation and curettage of uterus    . Node dissection  12/19/2011    Procedure: NODE DISSECTION;  Surgeon: [name], MD PHD;  Location: [facility];  Service: Gynecology;  Laterality: N/A;  . Robotic assisted total hysterectomy with bilateral salpingo oopherectomy  12/19/2011    robotic-assisted total laparoscopic hysterectomy bilateral salpingo-oophorectomy right pelvic lymph node biopsy on 12/19/2011 h  . Laparoscopic diverted colostomy N/A 09/23/2014    Procedure: LAPAROSCOPIC END COLOSTOMY MUCOUS FISTULA, BIOPSY OF VAGINAL TUMOR;  Surgeon: Alphonsa Overall, MD;  Location: [facility];  Service: General;  Laterality: N/A;    Current Facility-Administered Medications  Medication Dose Route Frequency Provider Last Rate Last  Dose  . 0.9 %  sodium chloride infusion   Intravenous Continuous Rise Patience, MD 100 mL/hr at 07/30/15 1024    . acetaminophen (TYLENOL) tablet 650 mg  650 mg Oral Q6H PRN Rise Patience, MD   650 mg at 07/30/15 1725   Or  . acetaminophen (TYLENOL) suppository 650 mg  650 mg Rectal Q6H PRN Rise Patience, MD      . amLODipine (NORVASC) tablet 2.5 mg  2.5 mg Oral q morning - 10a Rise Patience, MD   2.5 mg at 07/30/15 1023  . docusate sodium (COLACE) capsule 100 mg  100 mg Oral Daily Rise Patience, MD   100 mg at 07/30/15 1025  . enoxaparin (LOVENOX) injection 30 mg  30 mg Subcutaneous Daily Rise Patience, MD   30 mg at 07/30/15 1046  . [START ON 07/31/2015] fentaNYL (DURAGESIC - dosed mcg/hr) patch 175 mcg  175 mcg Transdermal Q72H Rise Patience, MD      . ferrous fumarate (HEMOCYTE - 106 mg FE) tablet 106 mg of iron  1 tablet Oral Daily Rise Patience, MD   106 mg of iron at 07/30/15 1026  . gabapentin (NEURONTIN) capsule 300 mg  300 mg Oral TID Rise Patience, MD   300 mg at 07/30/15 1624  .  hydrALAZINE (APRESOLINE) injection 10 mg  10 mg Intravenous Q4H PRN Rise Patience, MD      . iohexol (OMNIPAQUE) 300 MG/ML solution 25 mL  25 mL Oral Once PRN Medication Radiologist, MD      . Derrill Memo ON 08/01/2015] levofloxacin (LEVAQUIN) IVPB 500 mg  500 mg Intravenous Q48H Audrea Muscat Frens, RPH      . levofloxacin (LEVAQUIN) IVPB 750 mg  750 mg Intravenous Once Audrea Muscat Frens, RPH   750 mg at 07/30/15 1726  . megestrol (MEGACE) 400 MG/10ML suspension 400 mg  400 mg Oral BID Rise Patience, MD   400 mg at 07/30/15 1026  . metoprolol (LOPRESSOR) tablet 50 mg  50 mg Oral q morning - 10a Rise Patience, MD   50 mg at 07/30/15 1029  . multivitamin with minerals tablet 1 tablet  1 tablet Oral QHS Rise Patience, MD      . ondansetron [facility]) tablet 4 mg  4 mg Oral Q6H PRN Rise Patience, MD       Or  . ondansetron [facility]) injection 4 mg   4 mg Intravenous Q6H PRN Rise Patience, MD   4 mg at 07/30/15 1051  . oxyCODONE (Oxy IR/ROXICODONE) immediate release tablet 10 mg  10 mg Oral Q4H PRN Rise Patience, MD   10 mg at 07/30/15 1624  . promethazine (PHENERGAN) injection 12.5 mg  12.5 mg Intravenous Q6H PRN Theodis Blaze, MD       Review of patient's allergies indicates no known allergies. Family History  Problem Relation Age of Onset  . Heart disease Mother   . Heart attack Mother   . Diabetes Mother   . Stomach cancer Maternal Grandmother   . Colon cancer Neg Hx   . Esophageal cancer Neg Hx   . Rectal cancer Neg Hx    Social History:   reports that she quit smoking about 29 years ago. She has never used smokeless tobacco. She reports that she does not drink alcohol or use illicit drugs.   REVIEW OF SYSTEMS :  Physical Exam:   Blood pressure 134/60, pulse 85, temperature 97.6 F (36.4 C), temperature source Oral, resp. rate 16, height 5' (1.524 m), weight 55.29 kg (121 lb 14.3 oz), SpO2 98 %. Body mass index is 23.81 kg/(m^2).  Gen:  WDWN WF NAD  Neurological: Alert and oriented to person, place, and time. Motor and sensory function is grossly intact  Head: Normocephalic and atraumatic.  Eyes: Conjunctivae are normal. Pupils are equal, round, and reactive to light. No scleral icterus.  Neck: Normal range of motion. Neck supple. No tracheal deviation or thyromegaly present.  Cardiovascular:  SR without murmurs or gallops.  No carotid bruits Breast:  Not examined Respiratory: Effort normal.  No respiratory distress. No chest wall tenderness. Breath sounds normal.  No wheezes, rales or rhonchi.  Abdomen:  LLQ ostomy with scant brown stool in bag-no gas;  Mucous fistula covered.   GU:  Not examined Musculoskeletal: Normal range of motion. Extremities are nontender. No cyanosis, edema or clubbing noted Lymphadenopathy: No cervical, preauricular, postauricular or axillary adenopathy is present Skin: Skin is warm  and dry. No rash noted. No diaphoresis. No erythema. No pallor. Pscyh: Normal mood and affect. Behavior is normal. Judgment and thought content normal.   LABORATORY RESULTS: Results for orders placed or performed during the hospital encounter of 07/29/15 (from the past 48 hour(s))  CBC with Differential     Status: Abnormal  Collection Time: 07/29/15 10:20 PM  Result Value Ref Range   WBC 15.8 (H) 4.0 - 10.5 K/uL   RBC 3.33 (L) 3.87 - 5.11 MIL/uL   Hemoglobin 10.1 (L) 12.0 - 15.0 g/dL   HCT 31.8 (L) 36.0 - 46.0 %   MCV 95.5 78.0 - 100.0 fL   MCH 30.3 26.0 - 34.0 pg   MCHC 31.8 30.0 - 36.0 g/dL   RDW 14.2 11.5 - 15.5 %   Platelets 313 150 - 400 K/uL   Neutrophils Relative % 88 %   Lymphocytes Relative 6 %   Monocytes Relative 6 %   Eosinophils Relative 0 %   Basophils Relative 0 %   Neutro Abs 14.0 (H) 1.7 - 7.7 K/uL   Lymphs Abs 0.9 0.7 - 4.0 K/uL   Monocytes Absolute 0.9 0.1 - 1.0 K/uL   Eosinophils Absolute 0.0 0.0 - 0.7 K/uL   Basophils Absolute 0.0 0.0 - 0.1 K/uL   WBC Morphology DOHLE BODIES    Smear Review PLATELET CLUMPS NOTED ON SMEAR   Comprehensive metabolic panel     Status: Abnormal   Collection Time: 07/29/15 10:20 PM  Result Value Ref Range   Sodium 143 135 - 145 mmol/L   Potassium 3.7 3.5 - 5.1 mmol/L   Chloride 107 101 - 111 mmol/L   CO2 24 22 - 32 mmol/L   Glucose, Bld 153 (H) 65 - 99 mg/dL   BUN 23 (H) 6 - 20 mg/dL   Creatinine, Ser 2.00 (H) 0.44 - 1.00 mg/dL   Calcium 9.2 8.9 - 10.3 mg/dL   Total Protein 7.6 6.5 - 8.1 g/dL   Albumin 3.2 (L) 3.5 - 5.0 g/dL   AST 25 15 - 41 U/L   ALT 12 (L) 14 - 54 U/L   Alkaline Phosphatase 84 38 - 126 U/L   Total Bilirubin 0.9 0.3 - 1.2 mg/dL   GFR calc non Af Amer 24 (L) >60 mL/min   GFR calc Af Amer 28 (L) >60 mL/min    Comment: (NOTE) The eGFR has been calculated using the CKD EPI equation. This calculation has not been validated in all clinical situations. eGFR's persistently <60 mL/min signify possible  Chronic Kidney Disease.    Anion gap 12 5 - 15  Lipase, blood     Status: Abnormal   Collection Time: 07/29/15 10:20 PM  Result Value Ref Range   Lipase 13 (L) 22 - 51 U/L  I-Stat CG4 Lactic Acid, ED     Status: None   Collection Time: 07/29/15 11:48 PM  Result Value Ref Range   Lactic Acid, Venous 0.79 0.5 - 2.0 mmol/L  Urinalysis, Routine w reflex microscopic (not at [facility])     Status: Abnormal   Collection Time: 07/30/15 12:11 AM  Result Value Ref Range   Color, Urine AMBER (A) YELLOW    Comment: BIOCHEMICALS MAY BE AFFECTED BY COLOR   APPearance TURBID (A) CLEAR   Specific Gravity, Urine 1.025 1.005 - 1.030   pH 5.0 5.0 - 8.0   Glucose, UA NEGATIVE NEGATIVE mg/dL   Hgb urine dipstick MODERATE (A) NEGATIVE   Bilirubin Urine SMALL (A) NEGATIVE   Ketones, ur NEGATIVE NEGATIVE mg/dL   Protein, ur 100 (A) NEGATIVE mg/dL   Urobilinogen, UA 0.2 0.0 - 1.0 mg/dL   Nitrite NEGATIVE NEGATIVE   Leukocytes, UA MODERATE (A) NEGATIVE  Urine microscopic-add on     Status: Abnormal   Collection Time: 07/30/15 12:11 AM  Result Value Ref Range  Squamous Epithelial / LPF FEW (A) RARE   WBC, UA TOO NUMEROUS TO COUNT <3 WBC/hpf    Comment: WITH CLUMPS   RBC / HPF 7-10 <3 RBC/hpf   Bacteria, UA MANY (A) RARE   Casts GRANULAR CAST (A) NEGATIVE  I-stat troponin, ED     Status: None   Collection Time: 07/30/15  1:57 AM  Result Value Ref Range   Troponin i, poc 0.01 0.00 - 0.08 ng/mL   Comment 3            Comment: Due to the release kinetics of cTnI, a negative result within the first hours of the onset of symptoms does not rule out myocardial infarction with certainty. If myocardial infarction is still suspected, repeat the test at appropriate intervals.   I-Stat CG4 Lactic Acid, ED     Status: None   Collection Time: 07/30/15  1:59 AM  Result Value Ref Range   Lactic Acid, Venous 0.78 0.5 - 2.0 mmol/L  Comprehensive metabolic panel     Status: Abnormal   Collection Time: 07/30/15  10:35 AM  Result Value Ref Range   Sodium 142 135 - 145 mmol/L   Potassium 4.0 3.5 - 5.1 mmol/L   Chloride 107 101 - 111 mmol/L   CO2 25 22 - 32 mmol/L   Glucose, Bld 157 (H) 65 - 99 mg/dL   BUN 25 (H) 6 - 20 mg/dL   Creatinine, Ser 1.84 (H) 0.44 - 1.00 mg/dL   Calcium 9.0 8.9 - 10.3 mg/dL   Total Protein 7.0 6.5 - 8.1 g/dL   Albumin 2.8 (L) 3.5 - 5.0 g/dL   AST 28 15 - 41 U/L   ALT 16 14 - 54 U/L   Alkaline Phosphatase 72 38 - 126 U/L   Total Bilirubin 0.6 0.3 - 1.2 mg/dL   GFR calc non Af Amer 27 (L) >60 mL/min   GFR calc Af Amer 31 (L) >60 mL/min    Comment: (NOTE) The eGFR has been calculated using the CKD EPI equation. This calculation has not been validated in all clinical situations. eGFR's persistently <60 mL/min signify possible Chronic Kidney Disease.    Anion gap 10 5 - 15  CBC WITH DIFFERENTIAL     Status: Abnormal   Collection Time: 07/30/15 10:35 AM  Result Value Ref Range   WBC 11.1 (H) 4.0 - 10.5 K/uL   RBC 3.21 (L) 3.87 - 5.11 MIL/uL   Hemoglobin 9.8 (L) 12.0 - 15.0 g/dL   HCT 30.7 (L) 36.0 - 46.0 %   MCV 95.6 78.0 - 100.0 fL   MCH 30.5 26.0 - 34.0 pg   MCHC 31.9 30.0 - 36.0 g/dL   RDW 14.3 11.5 - 15.5 %   Platelets 265 150 - 400 K/uL   Neutrophils Relative % 85 %   Lymphocytes Relative 8 %   Monocytes Relative 7 %   Eosinophils Relative 0 %   Basophils Relative 0 %   Neutro Abs 9.4 (H) 1.7 - 7.7 K/uL   Lymphs Abs 0.9 0.7 - 4.0 K/uL   Monocytes Absolute 0.8 0.1 - 1.0 K/uL   Eosinophils Absolute 0.0 0.0 - 0.7 K/uL   Basophils Absolute 0.0 0.0 - 0.1 K/uL   WBC Morphology DOHLE BODIES   Glucose, capillary     Status: Abnormal   Collection Time: 07/30/15 12:23 PM  Result Value Ref Range   Glucose-Capillary 133 (H) 65 - 99 mg/dL     RADIOLOGY RESULTS: Ct Abdomen Pelvis Wo Contrast  07/30/2015   CLINICAL DATA:  "history of endometrial cancer and history of colostomy placement. Nausea vomiting since yesterday. Patient had 3 episodes of nausea  vomiting which was nonbloody yesterday and had one episode in the ER  EXAM: CT ABDOMEN AND PELVIS WITHOUT CONTRAST  TECHNIQUE: Multidetector CT imaging of the abdomen and pelvis was performed following the standard protocol without IV contrast.  COMPARISON:  03/24/2015 and earlier studies  FINDINGS: Patchy airspace opacity medially in the right middle lobe, new since previous exam.  Coronary and aortoiliac arterial calcifications.  Unremarkable liver, spleen, adrenal glands. Gallbladder physiologically distended. Diffuse pancreatic parenchymal atrophy. Unenhanced CT was performed per clinician order. Lack of IV contrast limits sensitivity and specificity, especially for evaluation of abdominal/pelvic solid viscera. Chronic patchy right renal parenchymal loss. Bilateral hydronephrosis and ureterectasis, progressive since previous exam. The ureters cannot be followed below the level of the sacroiliac joints.  Lumbar spine fixation hardware results in streak artifact degrading some of the images.  Stomach is distended by ingested material. Multiple dilated small bowel loops. 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 Terminal ileum however is decompressed. The colon is nondilated. Enlarged uterus. There is pelvic mass at the level of the lower uterine segment which extends nearly to the pelvic sidewalls bilaterally, with central necrotic portions in small gas bubbles. Urinary bladder is nondistended. No ascites. No free air. Subcentimeter left para-aortic and aortocaval lymph nodes.  IMPRESSION: 1. Distal small bowel obstruction, etiology uncertain. 2. New bilateral hydronephrosis and proximal ureterectasis. 3. Patchy medial right middle lobe airspace opacity, possibly early pneumonia but nonspecific. 4. 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    Electronically Signed   By: Lucrezia Europe M.D.   On: 07/30/2015 15:18   Dg Abd Acute W/chest  07/30/2015   CLINICAL DATA:  69 year old female with abdominal pain  EXAM: DG ABDOMEN ACUTE W/ 1V CHEST  COMPARISON:  CT dated 03/24/2015 and radiograph dated 08/19/2014  FINDINGS: Right pectoral infusion catheter with tip at the cavoatrial junction. The lungs are clear. No pleural effusion or pneumothorax. The cardiac silhouette is within normal limits.  Multiple normal caliber loops of bowel noted throughout the abdomen which may represent a degree of ileus. An early small-bowel obstruction is less likely but not excluded. Clinical correlation and follow-up recommended. A left lower quadrant ostomy is noted. There is degenerative changes of the spine with lower lumbar posterior fixation hardware.  IMPRESSION: Nondilated air-filled loops of small bowel throughout the abdomen may represent a degree of ileus. Clinical correlation is recommended.   Electronically Signed   By: Anner Crete M.D.   On: 07/30/2015 02:41    Problem List: Patient Active Problem List   Diagnosis Date Noted  . UTI (lower urinary tract infection) 07/30/2015  . Nausea & vomiting 07/30/2015  . ARF (acute renal failure) 07/30/2015  . Neoplastic malignant related fatigue 04/29/2015  . Anorexia 04/29/2015  . Dehydration 04/29/2015  . Portacath in place 04/16/2015  . Iron deficiency anemia due to chronic blood loss 04/16/2015  . Vaginal bleeding 04/16/2015  . Cancer associated pain 04/16/2015  . 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 Dysuria 03/16/2015  . Antineoplastic chemotherapy induced anemia 02/21/2015  . Cancer related pain 02/21/2015  . 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 Colonic obstruction 09/23/2014  . Diabetes mellitus 09/03/2014  . HTN (hypertension)  09/03/2014  . Anemia associated with chemotherapy 09/03/2014  . Nephrolithiasis 09/03/2014  . Hypomagnesemia 01/30/2014  . Cancer of  endometrium 11/27/2011    Assessment & Plan: Distal SBO with bilateral hydronephrosis;  This may be progression of her disease.  May need NG tube for decompression.  Will discuss with Dr. [name] who is on call this weekend.     Matt B. Hassell Done, MD, [facility] [phone] beeper [phone]  07/30/2015 5:34 PM

## 2015-07-30 NOTE — ED Notes (Signed)
Pt can go at 8:20

## 2015-07-30 NOTE — H&P (Signed)
[facility] History and Physical  Pam Vazquez ZOX:[id number] DOB: 04/25/1946 DOA: 07/29/2015  Referring physician: Ms. Natividad Brood. PCP: [name], MD  Specialists: Dr. [name]. Oncologist.  Chief Complaint: Nausea vomiting.  HPI: Pam Vazquez is a 69 y.o. female with history of endometrial cancer and history of colostomy placement process to the ER because of nausea vomiting since yesterday. Patient had 3 episodes of nausea vomiting which was nonbloody yesterday and had one episode in the ER this morning. Denies any abdominal pain. Patient also has some colostomy output. X-rays show possible ileus. Labs revealed worsening creatinine. UA shows features concerning for UTI. Patient has been admitted for further management of nausea vomiting acute renal failure and UTI.   Review of Systems: As presented in the history of presenting illness, rest negative.  Past Medical History  Diagnosis Date  . Angina     stress test on chart from 3/12/ none since March 2012  . Chronic kidney disease     kidney stones  . Hypertension     / EKG 1/13 Epic  . Radiation 02/23/14-03/27/14    45 gray to pelvic mass  . Glaucoma   . Cancer     endometrial- done with chemo and radiation  . Diabetes mellitus     no meds now   Past Surgical History  Procedure Laterality Date  . Hysteroscopy w/d&c  11/21/2011    Procedure: DILATATION AND CURETTAGE /HYSTEROSCOPY;  Surgeon: Gus Height;  Location: [facility];  Service: Gynecology;  Laterality: N/A;  . Tubal ligation    . Breast mass removal  1984, 1992    L breast  . Lithotripsy  2000, 2002  . Back surgery      91 and 2001  . Dilation and curettage of uterus    . Node dissection  12/19/2011    Procedure: NODE DISSECTION;  Surgeon: [name], MD PHD;  Location: [facility];  Service: Gynecology;  Laterality: N/A;  . Robotic assisted total hysterectomy with bilateral salpingo oopherectomy  12/19/2011    robotic-assisted total laparoscopic hysterectomy  bilateral salpingo-oophorectomy right pelvic lymph node biopsy on 12/19/2011 h  . Laparoscopic diverted colostomy N/A 09/23/2014    Procedure: LAPAROSCOPIC END COLOSTOMY MUCOUS FISTULA, BIOPSY OF VAGINAL TUMOR;  Surgeon: Alphonsa Overall, MD;  Location: [facility];  Service: General;  Laterality: N/A;   Social History:  reports that she quit smoking about 29 years ago. She has never used smokeless tobacco. She reports that she does not drink alcohol or use illicit drugs. Where does patient live home. Can patient participate in ADLs? Yes.  No Known Allergies  Family History:  Family History  Problem Relation Age of Onset  . Heart disease Mother   . Heart attack Mother   . Diabetes Mother   . Stomach cancer Maternal Grandmother   . Colon cancer Neg Hx   . Esophageal cancer Neg Hx   . Rectal cancer Neg Hx       Prior to Admission medications   Medication Sig Start Date End Date Taking? Authorizing Provider  amLODipine (NORVASC) 2.5 MG tablet Take 2.5 mg by mouth every morning.    Yes Historical Provider, MD  cholecalciferol (VITAMIN D) 1000 UNITS tablet Take 1,000 Units by mouth daily.   Yes Historical Provider, MD  docusate sodium (COLACE) 100 MG capsule Take 100 mg by mouth daily.    Yes Historical Provider, MD  Fe Fum-FA-B Cmp-C-Zn-Mg-Mn-Cu (HEMATINIC PLUS COMPLEX PO) Take 1 tablet by mouth daily.   Yes Historical  Provider, MD  fentaNYL (DURAGESIC) 100 MCG/HR Use 100 mcg patch with 75 mcg patch for a total dose of 175 mcg every 72 hrs for pain 07/08/15  Yes Lennis Marion Downer, MD  ferrous fumarate (HEMOCYTE - 106 MG FE) 325 (106 FE) MG TABS tablet Take 1 tablet (106 mg of iron total) by mouth daily. Take on empty stomach with OJ 07/05/15  Yes Lennis P Livesay, MD  gabapentin (NEURONTIN) 100 MG capsule TAKE TWO CAPSULES THREE TIMES DAILY 03/15/15  Yes Lennis Marion Downer, MD  gabapentin (NEURONTIN) 300 MG capsule Take 300 mg by mouth 3 (three) times daily.   Yes Historical Provider, MD  megestrol  (MEGACE) 400 MG/10ML suspension Take 10 mLs (400 mg total) by mouth 2 (two) times daily. 11/16/14  Yes Lennis Marion Downer, MD  metoprolol (LOPRESSOR) 50 MG tablet Take 50 mg by mouth every morning.    Yes Historical Provider, MD  Multiple Vitamin (MULTIVITAMIN WITH MINERALS) TABS Take 1 tablet by mouth every morning. She takes Dance movement psychotherapist.   Yes Historical Provider, MD  naproxen sodium (ANAPROX) 220 MG tablet Take 220 mg by mouth 3 (three) times daily.    Yes Historical Provider, MD  Oxycodone HCl 10 MG TABS Take 1-2 tablets by mouth every 3-4 hours as needed for pain. 07/08/15  Yes Lennis Marion Downer, MD  fentaNYL (DURAGESIC - DOSED MCG/HR) 75 MCG/HR Use 75 mcg patch in addition to 100 mcg for a total dose of 175 mcg every 72 hours. Patient not taking: Reported on 07/29/2015 07/08/15   Gordy Levan, MD  lidocaine-prilocaine (EMLA) cream Apply topically as needed. 05/20/13   Consuela Mimes, MD  LORazepam (ATIVAN) 0.5 MG tablet 1 tablet under tongue or by mouth every 6 hr as needed foe nausea. Will make drowsy Patient not taking: Reported on 11/16/2014 10/22/14   Lennis P [name], MD  ondansetron (ZOFRAN) 8 MG tablet Take 1 tablet (8 mg total) by mouth every 8 (eight) hours as needed for nausea or vomiting. Will not make drowsy Patient not taking: Reported on 07/08/2015 10/22/14   Gordy Levan, MD  tamoxifen (NOLVADEX) 20 MG tablet Take twice a day for 21 days every 6 weeks, alternating with Megace for 21 days Patient not taking: Reported on 07/29/2015 04/15/15   Gordy Levan, MD    Physical Exam: Filed Vitals:   07/30/15 0430 07/30/15 0500 07/30/15 0600 07/30/15 0630  BP: 146/75 124/69 120/59 129/60  Pulse: 91 109 82 88  Temp:      TempSrc:      Resp:      SpO2: 98% 98% 96% 96%     General:  Moderately built and nourished.  Eyes: Anicteric no pallor.  ENT: No discharge from the ears eyes nose and mouth.  Neck: No mass felt.  Cardiovascular: S1 and S2 heard.  Respiratory: No rhonchi  or crepitations.  Abdomen: No bowel sounds appreciated. Colostomy bag seen.  Skin: No edema.  Musculoskeletal: Appears normal.  Psychiatric: Alert awake oriented to time place and person.  Neurologic: Moves all extremities.  Labs on Admission:  Basic Metabolic Panel:  Recent Labs Lab 07/29/15 2220  NA 143  K 3.7  CL 107  CO2 24  GLUCOSE 153*  BUN 23*  CREATININE 2.00*  CALCIUM 9.2   Liver Function Tests:  Recent Labs Lab 07/29/15 2220  AST 25  ALT 12*  ALKPHOS 84  BILITOT 0.9  PROT 7.6  ALBUMIN 3.2*    Recent Labs Lab 07/29/15 2220  LIPASE 13*   No results for input(s): AMMONIA in the last 168 hours. CBC:  Recent Labs Lab 07/29/15 2220  WBC 15.8*  NEUTROABS 14.0*  HGB 10.1*  HCT 31.8*  MCV 95.5  PLT 313   Cardiac Enzymes: No results for input(s): CKTOTAL, CKMB, CKMBINDEX, TROPONINI in the last 168 hours.  BNP (last 3 results) No results for input(s): BNP in the last 8760 hours.  ProBNP (last 3 results) No results for input(s): PROBNP in the last 8760 hours.  CBG: No results for input(s): GLUCAP in the last 168 hours.  Radiological Exams on Admission: Dg Abd Acute W/chest  07/30/2015   CLINICAL DATA:  69 year old female with abdominal pain  EXAM: DG ABDOMEN ACUTE W/ 1V CHEST  COMPARISON:  CT dated 03/24/2015 and radiograph dated 08/19/2014  FINDINGS: Right pectoral infusion catheter with tip at the cavoatrial junction. The lungs are clear. No pleural effusion or pneumothorax. The cardiac silhouette is within normal limits.  Multiple normal caliber loops of bowel noted throughout the abdomen which may represent a degree of ileus. An early small-bowel obstruction is less likely but not excluded. Clinical correlation and follow-up recommended. A left lower quadrant ostomy is noted. There is degenerative changes of the spine with lower lumbar posterior fixation hardware.  IMPRESSION: Nondilated air-filled loops of small bowel throughout the abdomen  may represent a degree of ileus. Clinical correlation is recommended.   Electronically Signed   By: Anner Crete M.D.   On: 07/30/2015 02:41     Assessment/Plan Principal Problem:   Nausea & vomiting Active Problems:   HTN (hypertension)   UTI (lower urinary tract infection)   ARF (acute renal failure)   1. Nausea and vomiting - suspect most likely secondary to ileus probably from pain medications. However will get CT scan abdomen and pelvis with only oral contrast as patient has renal failure and avoid IV contrast to rule out any. For now I have kept patient nothing by mouth except medications. Continue hydration. 2.  Acute renal failure - probably from dehydration from nausea and vomiting. Continue with IV fluids and closely follow intake output. 3. UTI - patient has been placed on ceftriaxone. Urine cultures. 4. Hypertension - continue home medicines amlodipine and I have also placed patient on when necessary IV hydralazine. 5. Leukocytosis - probably reactionary and patient also has UTI. 6. History of endometrial carcinoma status post chemotherapy radiation and surgery with colostomy bag. 7. Chronic anemia - follow CBC.   I have reviewed patient's old charts and labs. Personally reviewed patient's x-ray.  DVT Prophylaxis Lovenox.   Code Status DO NOT RESUSCITATE. Family Communication: Patient's daughter.  Disposition Plan: Admit to inpatient.    KAKRAKANDY,ARSHAD N. [facility] Pager [phone].  If 7PM-7AM, please contact night-coverage [email or web address] Password [email or web address] 07/30/2015, 7:02 AM

## 2015-07-30 NOTE — Progress Notes (Addendum)
Pt admitted after midnight, please see earlier admission note. Admitted for evaluation of nausea and non bloody vomiting that is now thought to be secondary to adynamic ileus. CT abd and pelvis without contrast pending. Further recommendations pending results of CT abd.    Reviewed CT abd, noted distal SBO of unclear etiology, bilateral hydronephrosis, urology and surgery asked to review images and to provide recommendations.   Faye Ramsay, MD  [facility] Pager [phone]  If 7PM-7AM, please contact night-coverage [email or web address] Password [email or web address]

## 2015-07-31 DIAGNOSIS — C541 Malignant neoplasm of endometrium: Secondary | ICD-10-CM

## 2015-07-31 DIAGNOSIS — N1330 Unspecified hydronephrosis: Secondary | ICD-10-CM | POA: Diagnosis present

## 2015-07-31 DIAGNOSIS — A419 Sepsis, unspecified organism: Secondary | ICD-10-CM | POA: Diagnosis present

## 2015-07-31 DIAGNOSIS — N178 Other acute kidney failure: Secondary | ICD-10-CM

## 2015-07-31 DIAGNOSIS — D638 Anemia in other chronic diseases classified elsewhere: Secondary | ICD-10-CM | POA: Diagnosis present

## 2015-07-31 DIAGNOSIS — R630 Anorexia: Secondary | ICD-10-CM

## 2015-07-31 DIAGNOSIS — J189 Pneumonia, unspecified organism: Secondary | ICD-10-CM

## 2015-07-31 DIAGNOSIS — K56609 Unspecified intestinal obstruction, unspecified as to partial versus complete obstruction: Secondary | ICD-10-CM | POA: Diagnosis present

## 2015-07-31 DIAGNOSIS — J181 Lobar pneumonia, unspecified organism: Secondary | ICD-10-CM

## 2015-07-31 DIAGNOSIS — G893 Neoplasm related pain (acute) (chronic): Secondary | ICD-10-CM

## 2015-07-31 LAB — EXPECTORATED SPUTUM ASSESSMENT W REFEX TO RESP CULTURE

## 2015-07-31 LAB — GLUCOSE, CAPILLARY
GLUCOSE-CAPILLARY: 90 mg/dL (ref 65–99)
Glucose-Capillary: 101 mg/dL — ABNORMAL HIGH (ref 65–99)
Glucose-Capillary: 103 mg/dL — ABNORMAL HIGH (ref 65–99)
Glucose-Capillary: 121 mg/dL — ABNORMAL HIGH (ref 65–99)
Glucose-Capillary: 95 mg/dL (ref 65–99)

## 2015-07-31 LAB — BASIC METABOLIC PANEL
ANION GAP: 8 (ref 5–15)
BUN: 22 mg/dL — ABNORMAL HIGH (ref 6–20)
CHLORIDE: 108 mmol/L (ref 101–111)
CO2: 25 mmol/L (ref 22–32)
CREATININE: 1.46 mg/dL — AB (ref 0.44–1.00)
Calcium: 8.9 mg/dL (ref 8.9–10.3)
GFR calc non Af Amer: 36 mL/min — ABNORMAL LOW (ref >60)
GFR, EST AFRICAN AMERICAN: 41 mL/min — AB (ref >60)
Glucose, Bld: 109 mg/dL — ABNORMAL HIGH (ref 65–99)
POTASSIUM: 3.3 mmol/L — AB (ref 3.5–5.1)
Sodium: 141 mmol/L (ref 135–145)

## 2015-07-31 MED ORDER — POTASSIUM CHLORIDE CRYS ER 20 MEQ PO TBCR
40.0000 meq | EXTENDED_RELEASE_TABLET | Freq: Once | ORAL | Status: AC
  Administered 2015-07-31: 40 meq via ORAL
  Filled 2015-07-31: qty 2

## 2015-07-31 MED ORDER — SODIUM CHLORIDE 0.9 % IV SOLN
INTRAVENOUS | Status: AC
  Administered 2015-07-31: 16:00:00 via INTRAVENOUS

## 2015-07-31 MED ORDER — DEXTROSE 5 % IV SOLN
1.0000 g | INTRAVENOUS | Status: DC
  Filled 2015-07-31: qty 10

## 2015-07-31 NOTE — Progress Notes (Signed)
Progress Note: General Surgery Service   Subjective: Multiple times output from ostomy, no nausea or vomiting  Objective: Vital signs in last 24 hours: Temp:  [97.6 F (36.4 C)-98.7 F (37.1 C)] 98.7 F (37.1 C) (09/17 0603) Pulse Rate:  [84-100] 100 (09/17 0603) Resp:  [15-16] 16 (09/17 0603) BP: (118-156)/(60-80) 156/80 mmHg (09/17 0603) SpO2:  [97 %-100 %] 100 % (09/17 0603) Weight:  [55.29 kg (121 lb 14.3 oz)] 55.29 kg (121 lb 14.3 oz) (09/16 1005) Last BM Date: 07/30/15  Intake/Output from previous day: 09/16 0701 - 09/17 0700 In: 2020 [P.O.:60; I.V.:1960] Out: 825 [Urine:375; Stool:450] Intake/Output this shift:    Lungs: CTAB  Abd: soft, NT, ND, ostomy pink patent with gas in bag, mucous fistula pink without surrounding erythema  Extremities: no edema  Neuro: AOx4  Lab Results: CBC   Recent Labs  07/29/15 2220 07/30/15 1035  WBC 15.8* 11.1*  HGB 10.1* 9.8*  HCT 31.8* 30.7*  PLT 313 265   BMET  Recent Labs  07/29/15 2220 07/30/15 1035  NA 143 142  K 3.7 4.0  CL 107 107  CO2 24 25  GLUCOSE 153* 157*  BUN 23* 25*  CREATININE 2.00* 1.84*  CALCIUM 9.2 9.0   PT/INR No results for input(s): LABPROT, INR in the last 72 hours. ABG No results for input(s): PHART, HCO3 in the last 72 hours.  Invalid input(s): PCO2, PO2  Studies/Results: Ct Abdomen Pelvis Wo Contrast  07/30/2015   CLINICAL DATA:  "history of endometrial cancer and history of colostomy placement. Nausea vomiting since yesterday. Patient had 3 episodes of nausea vomiting which was nonbloody yesterday and had one episode in the ER  EXAM: CT ABDOMEN AND PELVIS WITHOUT CONTRAST  TECHNIQUE: Multidetector CT imaging of the abdomen and pelvis was performed following the standard protocol without IV contrast.  COMPARISON:  03/24/2015 and earlier studies  FINDINGS: Patchy airspace opacity medially in the right middle lobe, new since previous exam.  Coronary and aortoiliac arterial  calcifications.  Unremarkable liver, spleen, adrenal glands. Gallbladder physiologically distended. Diffuse pancreatic parenchymal atrophy. Unenhanced CT was performed per clinician order. Lack of IV contrast limits sensitivity and specificity, especially for evaluation of abdominal/pelvic solid viscera. Chronic patchy right renal parenchymal loss. Bilateral hydronephrosis and ureterectasis, progressive since previous exam. The ureters cannot be followed below the level of the sacroiliac joints.  Lumbar spine fixation hardware results in streak artifact degrading some of the images.  Stomach is distended by ingested material. Multiple dilated small bowel loops. 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 Terminal ileum however is decompressed. The colon is nondilated. Enlarged uterus. There is pelvic mass at the level of the lower uterine segment which extends nearly to the pelvic sidewalls bilaterally, with central necrotic portions in small gas bubbles. Urinary bladder is nondistended. No ascites. No free air. Subcentimeter left para-aortic and aortocaval lymph nodes.  IMPRESSION: 1. Distal small bowel obstruction, etiology uncertain. 2. New bilateral hydronephrosis and proximal ureterectasis. 3. Patchy medial right middle lobe airspace opacity, possibly early pneumonia but nonspecific. 4. 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   Electronically Signed   By: Lucrezia Europe M.D.   On: 07/30/2015 15:18   Dg Abd Acute W/chest  07/30/2015   CLINICAL DATA:  69 year old female with abdominal pain  EXAM: DG ABDOMEN ACUTE W/ 1V CHEST  COMPARISON:  CT dated  03/24/2015 and radiograph dated 08/19/2014  FINDINGS: Right pectoral infusion catheter with tip at the cavoatrial junction. The lungs are clear. No pleural effusion or pneumothorax. The cardiac  silhouette is within normal limits.  Multiple normal caliber loops of bowel noted throughout the abdomen which may represent a degree of ileus. An early small-bowel obstruction is less likely but not excluded. Clinical correlation and follow-up recommended. A left lower quadrant ostomy is noted. There is degenerative changes of the spine with lower lumbar posterior fixation hardware.  IMPRESSION: Nondilated air-filled loops of small bowel throughout the abdomen may represent a degree of ileus. Clinical correlation is recommended.   Electronically Signed   By: Anner Crete M.D.   On: 07/30/2015 02:41    Anti-infectives: Anti-infectives    Start     Dose/Rate Route Frequency Ordered Stop   08/01/15 1700  levofloxacin (LEVAQUIN) IVPB 500 mg     500 mg 100 mL/hr over 60 Minutes Intravenous Every 48 hours 07/30/15 1617     07/31/15 0200  cefTRIAXone (ROCEPHIN) 1 g in dextrose 5 % 50 mL IVPB  Status:  Discontinued     1 g 100 mL/hr over 30 Minutes Intravenous Every 24 hours 07/30/15 0943 07/30/15 1556   07/30/15 1700  levofloxacin (LEVAQUIN) IVPB 750 mg     750 mg 100 mL/hr over 90 Minutes Intravenous  Once 07/30/15 1616 07/30/15 1856   07/30/15 0115  cefTRIAXone (ROCEPHIN) 1 g in dextrose 5 % 50 mL IVPB     1 g 100 mL/hr over 30 Minutes Intravenous  Once 07/30/15 0113 07/30/15 0259      Medicaions: Scheduled Meds: . amLODipine  2.5 mg Oral q morning - 10a  . docusate sodium  100 mg Oral Daily  . enoxaparin (LOVENOX) injection  30 mg Subcutaneous Daily  . fentaNYL  175 mcg Transdermal Q72H  . ferrous fumarate  1 tablet Oral Daily  . gabapentin  300 mg Oral TID  . [START ON 08/01/2015] levofloxacin (LEVAQUIN) IV  500 mg Intravenous Q48H  . megestrol  400 mg Oral BID  . metoprolol  50 mg Oral q morning - 10a  . multivitamin with minerals  1 tablet Oral QHS   Continuous Infusions: . sodium chloride 100 mL/hr at 07/30/15 2233   PRN Meds:.acetaminophen **OR** acetaminophen, hydrALAZINE,  iohexol, ondansetron **OR** ondansetron (ZOFRAN) IV, oxyCODONE, promethazine  Assessment/Plan: Patient Active Problem List   Diagnosis Date Noted  . UTI (lower urinary tract infection) 07/30/2015  . Nausea & vomiting 07/30/2015  . ARF (acute renal failure) 07/30/2015  . Neoplastic malignant related fatigue 04/29/2015  . Anorexia 04/29/2015  . Dehydration 04/29/2015  . Portacath in place 04/16/2015  . Iron deficiency anemia due to chronic blood loss 04/16/2015  . Vaginal bleeding 04/16/2015  . Cancer associated pain 04/16/2015  . 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 Dysuria 03/16/2015  . Antineoplastic chemotherapy induced anemia 02/21/2015  . Cancer related pain 02/21/2015  . 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 Colonic obstruction 09/23/2014  . Diabetes mellitus 09/03/2014  . HTN (hypertension) 09/03/2014  . Anemia associated with chemotherapy 09/03/2014  . Nephrolithiasis 09/03/2014  . Hypomagnesemia 01/30/2014  . Cancer of endometrium 11/27/2011     LOS: 1 day  Partial SBO, appears to have resolved -advance to clears if ok with primary team Mickeal Skinner, MD Pg# [phone] [place] [facility]

## 2015-07-31 NOTE — Progress Notes (Signed)
Pt seen and examined. Full consult to follow. Continue to advance diet.

## 2015-07-31 NOTE — Progress Notes (Signed)
Patient ID: Pam Vazquez, female   DOB: [birth year], 69 y.o.   MRN: [id number] [facility] PROGRESS NOTE  SRINIKA DELONE JEH:[id number] DOB: [birth year] DOA: 07/29/2015 PCP: [name], MD   Brief narrative:    69 y.o. female with history of endometrial cancer and history of colostomy placement presented to [facility] with main concern of several days duration of progressively worsening abd pain, throbbing and constant, 7/10 in severity, associated with nausea, cramps, non bloody vomiting.   In Ed, ptwas hemodynamically stable, VS notable for HR up to 110, blood work notable for WBC 15.8. UA suggestive of UTI, abd XRAY concerning for ileus. [facility] asked to admit for further evaluation.   Assessment/Plan:    Principal Problem:   Nausea & vomiting - appears to be secondary to distal SBO, bilateral hydronephrosis, ? UTI and PNA - now better that SBO is resolved - advance diet to clear liquids - surgery team following  Active Problems:   Sepsis due to lobar PNA, RML PNA, UTI, unknown pathogens at this time  - continue Levaquin day #2/7 - follow upon blood and urine cultures - provide IVF for now and continue with supportive measures     Cancer related pain - allow analgesia as needed     UTI (lower urinary tract infection) - Levaquin day #2, OK to continue  - follow up on urine culture     ARF (acute renal failure) - appears to be secondary to pre renal etiology - IVF have been provided and Cr continues trending down  - will repeat BMP in AM    Right middle lobe pneumonia - Levaquin should be adequate in coverage   SBO (small bowel obstruction) - allow clear liquid diet - surgery team following    Bilateral hydronephrosis - urology team consulted     Anorexia, severe PCM - nutritionist consulted    Cancer of endometrium - history of cancer, followed with Dr [name]     Anemia of chronic disease, malignancy  - no signs of active bleeding  - CBC in AM  DVT  prophylaxis - Lovenox SQ   Code Status: DNR Family Communication:  plan of care discussed with the patient and family at bedside  Disposition Plan: Home when stable.   IV access:  Peripheral IV  Procedures and diagnostic studies:    Ct Abdomen Pelvis Wo Contrast 07/30/2015   Distal small bowel obstruction, etiology uncertain. 2. New bilateral hydronephrosis and proximal ureterectasis. 3. Patchy medial right middle lobe airspace opacity, possibly early pneumonia but nonspecific. 4. 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     Dg Abd Acute W/chest 07/30/2015  Nondilated air-filled loops of small bowel throughout the abdomen may represent a degree of ileus. Clinical correlation is recommended.    Medical Consultants:  Urology Surgery   Other Consultants:  None  IAnti-Infectives:   Levaquin 9/16 -->  Faye Ramsay, MD  [facility] Pager [phone]  If 7PM-7AM, please contact night-coverage [email or web address] Password [facility] 07/31/2015, 3:27 PM   LOS: 1 day   HPI/Subjective: No events overnight.   Objective: Filed Vitals:   07/31/15 0603 07/31/15 0900 07/31/15 1123 07/31/15 1347  BP: 156/80 139/75 149/72 124/62  Pulse: 100 99  80  Temp: 98.7 F (37.1 C) 97.3 F (36.3 C)  98.2 F (36.8 C)  TempSrc: Oral Oral  Oral  Resp: 16 16  15   Height:      Weight:      SpO2: 100% 100%  100%  Intake/Output Summary (Last 24 hours) at 07/31/15 1527 Last data filed at 07/31/15 1350  Gross per 24 hour  Intake   2020 ml  Output   1300 ml  Net    720 ml    Exam:   General:  Pt is alert, follows commands appropriately, not in acute distress  Cardiovascular: Regular rate and rhythm, S1/S2, no murmurs, no rubs, no gallops  Respiratory: Clear to auscultation bilaterally, no wheezing, no crackles, no rhonchi  Abdomen: Soft, non tender, slightly distended, bowel sounds present, no guarding   Data Reviewed: Basic Metabolic Panel:  Recent Labs Lab  07/29/15 2220 07/30/15 1035 07/31/15 0850  NA 143 142 141  K 3.7 4.0 3.3*  CL 107 107 108  CO2 24 25 25   GLUCOSE 153* 157* 109*  BUN 23* 25* 22*  CREATININE 2.00* 1.84* 1.46*  CALCIUM 9.2 9.0 8.9   Liver Function Tests:  Recent Labs Lab 07/29/15 2220 07/30/15 1035  AST 25 28  ALT 12* 16  ALKPHOS 84 72  BILITOT 0.9 0.6  PROT 7.6 7.0  ALBUMIN 3.2* 2.8*    Recent Labs Lab 07/29/15 2220  LIPASE 13*   No results for input(s): AMMONIA in the last 168 hours. CBC:  Recent Labs Lab 07/29/15 2220 07/30/15 1035  WBC 15.8* 11.1*  NEUTROABS 14.0* 9.4*  HGB 10.1* 9.8*  HCT 31.8* 30.7*  MCV 95.5 95.6  PLT 313 265   CBG:  Recent Labs Lab 07/30/15 1223 07/30/15 1759 07/31/15 0008 07/31/15 0626 07/31/15 1223  GLUCAP 133* 127* 103* 101* 121*    No results found for this or any previous visit (from the past 240 hour(s)).   Scheduled Meds: . amLODipine  2.5 mg Oral q morning - 10a  . docusate sodium  100 mg Oral Daily  . enoxaparin (LOVENOX) injection  30 mg Subcutaneous Daily  . fentaNYL  175 mcg Transdermal Q72H  . ferrous fumarate  1 tablet Oral Daily  . gabapentin  300 mg Oral TID  . [START ON 08/01/2015] levofloxacin (LEVAQUIN) IV  500 mg Intravenous Q48H  . megestrol  400 mg Oral BID  . metoprolol  50 mg Oral q morning - 10a  . multivitamin with minerals  1 tablet Oral QHS   Continuous Infusions:    [id number]

## 2015-08-01 DIAGNOSIS — A419 Sepsis, unspecified organism: Secondary | ICD-10-CM

## 2015-08-01 DIAGNOSIS — K5669 Other intestinal obstruction: Secondary | ICD-10-CM

## 2015-08-01 LAB — BASIC METABOLIC PANEL
ANION GAP: 8 (ref 5–15)
BUN: 15 mg/dL (ref 6–20)
CHLORIDE: 110 mmol/L (ref 101–111)
CO2: 23 mmol/L (ref 22–32)
Calcium: 8.7 mg/dL — ABNORMAL LOW (ref 8.9–10.3)
Creatinine, Ser: 1.32 mg/dL — ABNORMAL HIGH (ref 0.44–1.00)
GFR calc Af Amer: 47 mL/min — ABNORMAL LOW (ref >60)
GFR calc non Af Amer: 40 mL/min — ABNORMAL LOW (ref >60)
GLUCOSE: 91 mg/dL (ref 65–99)
POTASSIUM: 3.6 mmol/L (ref 3.5–5.1)
Sodium: 141 mmol/L (ref 135–145)

## 2015-08-01 LAB — GLUCOSE, CAPILLARY
GLUCOSE-CAPILLARY: 119 mg/dL — AB (ref 65–99)
GLUCOSE-CAPILLARY: 148 mg/dL — AB (ref 65–99)
Glucose-Capillary: 104 mg/dL — ABNORMAL HIGH (ref 65–99)

## 2015-08-01 LAB — CBC
HCT: 29.7 % — ABNORMAL LOW (ref 36.0–46.0)
Hemoglobin: 9.3 g/dL — ABNORMAL LOW (ref 12.0–15.0)
MCH: 29.7 pg (ref 26.0–34.0)
MCHC: 31.3 g/dL (ref 30.0–36.0)
MCV: 94.9 fL (ref 78.0–100.0)
Platelets: 268 10*3/uL (ref 150–400)
RBC: 3.13 MIL/uL — ABNORMAL LOW (ref 3.87–5.11)
RDW: 14.3 % (ref 11.5–15.5)
WBC: 17.2 10*3/uL — ABNORMAL HIGH (ref 4.0–10.5)

## 2015-08-01 LAB — STREP PNEUMONIAE URINARY ANTIGEN: Strep Pneumo Urinary Antigen: NEGATIVE

## 2015-08-01 NOTE — Progress Notes (Signed)
PT Cancellation Note  Patient Details Name: ADIANNA DARWIN MRN: [id number] DOB: 09-13-1946   Cancelled Treatment:    Reason Eval/Treat Not Completed: PT screened, no needs identified, will sign off (patient reports ambulating well. )   Claretha Cooper 08/01/2015, 12:20 PM

## 2015-08-01 NOTE — Progress Notes (Signed)
  Subjective: Patient reports Minimal flank pain  Objective: Vital signs in last 24 hours: Temp:  [98.2 F (36.8 C)-98.8 F (37.1 C)] 98.8 F (37.1 C) (09/18 0616) Pulse Rate:  [80-93] 93 (09/18 0616) Resp:  [14-18] 18 (09/18 0616) BP: (121-149)/(62-72) 126/63 mmHg (09/18 0616) SpO2:  [98 %-100 %] 98 % (09/18 0616)A  Intake/Output from previous day: 09/17 0701 - 09/18 0700 In: 1847.5 [P.O.:360; I.V.:1487.5] Out: 925 [Urine:925] Intake/Output this shift:    Past Medical History  Diagnosis Date  . Angina     stress test on chart from 3/12/ none since March 2012  . Chronic kidney disease     kidney stones  . Hypertension     / EKG 1/13 Epic  . Radiation 02/23/14-03/27/14    45 gray to pelvic mass  . Glaucoma   . Cancer     endometrial- done with chemo and radiation  . Diabetes mellitus     no meds now    Physical Exam:  General:NAD Lungs - Normal respiratory effort, chest expands symmetrically.  Abdomen - Soft, non-tender & non-distended. No CVA tenderness  Lab Results:  Recent Labs  07/29/15 2220 07/30/15 1035 08/01/15 0436  WBC 15.8* 11.1* 17.2*  HGB 10.1* 9.8* 9.3*  HCT 31.8* 30.7* 29.7*   BMET  Recent Labs  07/31/15 0850 08/01/15 0436  NA 141 141  K 3.3* 3.6  CL 108 110  CO2 25 23  GLUCOSE 109* 91  BUN 22* 15  CREATININE 1.46* 1.32*  CALCIUM 8.9 8.7*   No results for input(s): LABURIN in the last 72 hours. Results for orders placed or performed during the hospital encounter of 07/29/15  Culture, expectorated sputum-assessment     Status: None   Collection Time: 07/31/15  7:12 PM  Result Value Ref Range Status   Specimen Description SPUTUM  Final   Special Requests NONE  Final   Sputum evaluation   Final    MICROSCOPIC FINDINGS SUGGEST THAT THIS SPECIMEN IS NOT REPRESENTATIVE OF LOWER RESPIRATORY SECRETIONS. PLEASE RECOLLECT. NOTIFIED [place] AT 2045 ON [id number] BY HOOKER,B    Report Status 07/31/2015 FINAL  Final     Studies/Results: No results found.  Assessment/Plan:  69 y.o. with recurrent endometrial cancer with bilateral hydronephrosis and elevated creatinine which is improving with hydration. We again discussed observation vs stents vs PCN and the patient is happy with observation at this time  - Transition to PO hydration when medically possible  - continue to monitor creatinine - strict I&Os  Christell Faith 08/01/2015, 9:34 AM

## 2015-08-01 NOTE — Progress Notes (Signed)
Subjective: Patient tolerating clears with no nausea or vomiting Significant output from ostomy - emptied several times Still having some output from mucus fistula  Objective: Vital signs in last 24 hours: Temp:  [97.3 F (36.3 C)-98.8 F (37.1 C)] 98.8 F (37.1 C) (09/18 0616) Pulse Rate:  [80-99] 93 (09/18 0616) Resp:  [14-18] 18 (09/18 0616) BP: (121-149)/(62-75) 126/63 mmHg (09/18 0616) SpO2:  [98 %-100 %] 98 % (09/18 0616) Last BM Date: 07/31/15  Intake/Output from previous day: 09/17 0701 - 09/18 0700 In: 1847.5 [P.O.:360; I.V.:1487.5] Out: 775 [Urine:775] Intake/Output this shift:    General appearance: alert, cooperative and no distress GI: soft, non-tender; pink LLQ ostomy with good stool output Mucus fistula - dry dressing with minimal output  Lab Results:   Recent Labs  07/30/15 1035 08/01/15 0436  WBC 11.1* 17.2*  HGB 9.8* 9.3*  HCT 30.7* 29.7*  PLT 265 268   BMET  Recent Labs  07/31/15 0850 08/01/15 0436  NA 141 141  K 3.3* 3.6  CL 108 110  CO2 25 23  GLUCOSE 109* 91  BUN 22* 15  CREATININE 1.46* 1.32*  CALCIUM 8.9 8.7*   PT/INR No results for input(s): LABPROT, INR in the last 72 hours. ABG No results for input(s): PHART, HCO3 in the last 72 hours.  Invalid input(s): PCO2, PO2  Studies/Results: Ct Abdomen Pelvis Wo Contrast  07/30/2015   CLINICAL DATA:  "history of endometrial cancer and history of colostomy placement. Nausea vomiting since yesterday. Patient had 3 episodes of nausea vomiting which was nonbloody yesterday and had one episode in the ER  EXAM: CT ABDOMEN AND PELVIS WITHOUT CONTRAST  TECHNIQUE: Multidetector CT imaging of the abdomen and pelvis was performed following the standard protocol without IV contrast.  COMPARISON:  03/24/2015 and earlier studies  FINDINGS: Patchy airspace opacity medially in the right middle lobe, new since previous exam.  Coronary and aortoiliac arterial calcifications.  Unremarkable liver,  spleen, adrenal glands. Gallbladder physiologically distended. Diffuse pancreatic parenchymal atrophy. Unenhanced CT was performed per clinician order. Lack of IV contrast limits sensitivity and specificity, especially for evaluation of abdominal/pelvic solid viscera. Chronic patchy right renal parenchymal loss. Bilateral hydronephrosis and ureterectasis, progressive since previous exam. The ureters cannot be followed below the level of the sacroiliac joints.  Lumbar spine fixation hardware results in streak artifact degrading some of the images.  Stomach is distended by ingested material. Multiple dilated small bowel loops. 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 Terminal ileum however is decompressed. The colon is nondilated. Enlarged uterus. There is pelvic mass at the level of the lower uterine segment which extends nearly to the pelvic sidewalls bilaterally, with central necrotic portions in small gas bubbles. Urinary bladder is nondistended. No ascites. No free air. Subcentimeter left para-aortic and aortocaval lymph nodes.  IMPRESSION: 1. Distal small bowel obstruction, etiology uncertain. 2. New bilateral hydronephrosis and proximal ureterectasis. 3. Patchy medial right middle lobe airspace opacity, possibly early pneumonia but nonspecific. 4. 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   Electronically Signed   By: Lucrezia Europe M.D.   On: 07/30/2015 15:18    Anti-infectives: Anti-infectives    Start     Dose/Rate Route Frequency Ordered Stop   08/01/15 1700  levofloxacin (LEVAQUIN) IVPB 500 mg     500 mg 100 mL/hr over 60 Minutes Intravenous Every 48 hours 07/30/15  1617     07/31/15 1600  cefTRIAXone (ROCEPHIN) 1 g in dextrose 5 % 50 mL IVPB  Status:  Discontinued     1 g 100 mL/hr over 30 Minutes Intravenous Every 24 hours  07/31/15 1528 07/31/15 1534   07/31/15 0200  cefTRIAXone (ROCEPHIN) 1 g in dextrose 5 % 50 mL IVPB  Status:  Discontinued     1 g 100 mL/hr over 30 Minutes Intravenous Every 24 hours 07/30/15 0943 07/30/15 1556   07/30/15 1700  levofloxacin (LEVAQUIN) IVPB 750 mg     750 mg 100 mL/hr over 90 Minutes Intravenous  Once 07/30/15 1616 07/30/15 1856   07/30/15 0115  cefTRIAXone (ROCEPHIN) 1 g in dextrose 5 % 50 mL IVPB     1 g 100 mL/hr over 30 Minutes Intravenous  Once 07/30/15 0113 07/30/15 0259      Assessment/Plan: Partial SBO secondary to adhesions - resolved Advance diet Probably ready for discharge tomorrow.  LOS: 2 days    TSUEI,MATTHEW K. 08/01/2015

## 2015-08-01 NOTE — Progress Notes (Signed)
Patient ID: Pam Vazquez, female   DOB: Jun 25, 1946, 69 y.o.   MRN: [id number] [facility] PROGRESS NOTE  Pam Vazquez CZY:[id number] DOB: August 21, 1946 DOA: 07/29/2015 PCP: [name], MD   Brief narrative:    69 y.o. female with history of endometrial cancer and history of colostomy placement presented to [facility] with main concern of several days duration of progressively worsening abd pain, throbbing and constant, 7/10 in severity, associated with nausea, cramps, non bloody vomiting.   In Ed, ptwas hemodynamically stable, VS notable for HR up to 110, blood work notable for WBC 15.8. UA suggestive of UTI, abd XRAY concerning for ileus. TRH asked to admit for further evaluation.   Assessment/Plan:    Principal Problem:   Nausea & vomiting - secondary to distal SBO, bilateral hydronephrosis, ? UTI and PNA - now better that SBO is resolved - advance diet as pt able to tolerate  - surgery team following, appreciate assistance  Active Problems:   Sepsis due to lobar PNA, RML PNA, UTI, unknown pathogens at this time  - continue Levaquin day #3/7 - follow upon blood and urine cultures - pt remains afebrile but WBC still up so will need to repeat CBC in AM again to make sure it is trending down     Cancer related pain - allow analgesia as needed     UTI (lower urinary tract infection) - Levaquin day #3, OK to continue  - follow up on urine culture     ARF (acute renal failure) - appears to be secondary to pre renal etiology - IVF have been provided and Cr continues trending down  - will repeat BMP in AM    Right middle lobe pneumonia - Levaquin should be adequate in coverage - pt denies productive cough at this time - if pt reports productive cough, will attempt to obtain sample and send for analysis    SBO (small bowel obstruction) - surgery team following - advance diet as pt able to tolerate     Bilateral hydronephrosis - urology team consulted, conservative  management for now     Anorexia, severe PCM - nutritionist consulted    Cancer of endometrium - history of cancer, followed with Dr [name]     Anemia of chronic disease, malignancy  - no signs of active bleeding  - CBC in AM  DVT prophylaxis - Lovenox SQ   Code Status: DNR Family Communication:  plan of care discussed with the patient and family at bedside  Disposition Plan: Home when stable.   IV access:  Peripheral IV  Procedures and diagnostic studies:    Ct Abdomen Pelvis Wo Contrast 07/30/2015   Distal small bowel obstruction, etiology uncertain. 2. New bilateral hydronephrosis and proximal ureterectasis. 3. Patchy medial right middle lobe airspace opacity, possibly early pneumonia but nonspecific. 4. 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     Dg Abd Acute W/chest 07/30/2015  Nondilated air-filled loops of small bowel throughout the abdomen may represent a degree of ileus. Clinical correlation is recommended.    Medical Consultants:  Urology Surgery   Other Consultants:  None  IAnti-Infectives:   Levaquin 9/16 -->  Faye Ramsay, MD  [facility] Pager [phone]  If 7PM-7AM, please contact night-coverage [email or web address] Password [facility] 08/01/2015, 7:15 AM   LOS: 2 days   HPI/Subjective: No events overnight.   Objective: Filed Vitals:   07/31/15 1123 07/31/15 1347 07/31/15 2155 08/01/15 0616  BP: 149/72 124/62 121/68 126/63  Pulse:  80  85 93  Temp:  98.2 F (36.8 C) 98.2 F (36.8 C) 98.8 F (37.1 C)  TempSrc:  Oral Oral Oral  Resp:  15 14 18   Height:      Weight:      SpO2:  100% 98% 98%    Intake/Output Summary (Last 24 hours) at 08/01/15 0715 Last data filed at 08/01/15 0617  Gross per 24 hour  Intake 1847.5 ml  Output    775 ml  Net 1072.5 ml    Exam:   General:  Pt is alert, follows commands appropriately, not in acute distress  Cardiovascular: Regular rate and rhythm, S1/S2, no murmurs, no rubs, no  gallops  Respiratory: Clear to auscultation bilaterally, no wheezing, no crackles, no rhonchi  Abdomen: Soft, non tender, slightly distended, bowel sounds present, no guarding   Data Reviewed: Basic Metabolic Panel:  Recent Labs Lab 07/29/15 2220 07/30/15 1035 07/31/15 0850 08/01/15 0436  NA 143 142 141 141  K 3.7 4.0 3.3* 3.6  CL 107 107 108 110  CO2 24 25 25 23   GLUCOSE 153* 157* 109* 91  BUN 23* 25* 22* 15  CREATININE 2.00* 1.84* 1.46* 1.32*  CALCIUM 9.2 9.0 8.9 8.7*   Liver Function Tests:  Recent Labs Lab 07/29/15 2220 07/30/15 1035  AST 25 28  ALT 12* 16  ALKPHOS 84 72  BILITOT 0.9 0.6  PROT 7.6 7.0  ALBUMIN 3.2* 2.8*    Recent Labs Lab 07/29/15 2220  LIPASE 13*   No results for input(s): AMMONIA in the last 168 hours. CBC:  Recent Labs Lab 07/29/15 2220 07/30/15 1035 08/01/15 0436  WBC 15.8* 11.1* 17.2*  NEUTROABS 14.0* 9.4*  --   HGB 10.1* 9.8* 9.3*  HCT 31.8* 30.7* 29.7*  MCV 95.5 95.6 94.9  PLT 313 265 268   CBG:  Recent Labs Lab 07/31/15 0626 07/31/15 1223 07/31/15 1755 07/31/15 2342 08/01/15 0612  GLUCAP 101* 121* 90 95 104*    Recent Results (from the past 240 hour(s))  Culture, expectorated sputum-assessment     Status: None   Collection Time: 07/31/15  7:12 PM  Result Value Ref Range Status   Specimen Description SPUTUM  Final   Special Requests NONE  Final   Sputum evaluation   Final    MICROSCOPIC FINDINGS SUGGEST THAT THIS SPECIMEN IS NOT REPRESENTATIVE OF LOWER RESPIRATORY SECRETIONS. PLEASE RECOLLECT. NOTIFIED [place] AT 2045 ON [id number] BY HOOKER,B    Report Status 07/31/2015 FINAL  Final     Scheduled Meds: . amLODipine  2.5 mg Oral q morning - 10a  . docusate sodium  100 mg Oral Daily  . fentaNYL  175 mcg Transdermal Q72H  . ferrous fumarate  1 tablet Oral Daily  . gabapentin  300 mg Oral TID  . levofloxacin (LEVAQUIN) IV  500 mg Intravenous Q48H  . megestrol  400 mg Oral BID  . metoprolol  50 mg Oral  q morning - 10a  . multivitamin with minerals  1 tablet Oral QHS   Continuous Infusions: . sodium chloride 50 mL/hr at 07/31/15 1545     [phone]

## 2015-08-01 NOTE — Progress Notes (Signed)
Family reports pt has appt to see her pcp Thursday 9/22. Pam Vazquez, [employer]

## 2015-08-02 DIAGNOSIS — R1013 Epigastric pain: Secondary | ICD-10-CM

## 2015-08-02 DIAGNOSIS — R109 Unspecified abdominal pain: Secondary | ICD-10-CM | POA: Insufficient documentation

## 2015-08-02 LAB — BASIC METABOLIC PANEL
ANION GAP: 7 (ref 5–15)
BUN: 12 mg/dL (ref 6–20)
CO2: 23 mmol/L (ref 22–32)
Calcium: 8.5 mg/dL — ABNORMAL LOW (ref 8.9–10.3)
Chloride: 107 mmol/L (ref 101–111)
Creatinine, Ser: 1.24 mg/dL — ABNORMAL HIGH (ref 0.44–1.00)
GFR calc Af Amer: 51 mL/min — ABNORMAL LOW (ref >60)
GFR, EST NON AFRICAN AMERICAN: 44 mL/min — AB (ref >60)
Glucose, Bld: 108 mg/dL — ABNORMAL HIGH (ref 65–99)
POTASSIUM: 3.3 mmol/L — AB (ref 3.5–5.1)
SODIUM: 137 mmol/L (ref 135–145)

## 2015-08-02 LAB — CBC
HCT: 27.7 % — ABNORMAL LOW (ref 36.0–46.0)
Hemoglobin: 8.7 g/dL — ABNORMAL LOW (ref 12.0–15.0)
MCH: 29.8 pg (ref 26.0–34.0)
MCHC: 31.4 g/dL (ref 30.0–36.0)
MCV: 94.9 fL (ref 78.0–100.0)
PLATELETS: 248 10*3/uL (ref 150–400)
RBC: 2.92 MIL/uL — AB (ref 3.87–5.11)
RDW: 14.3 % (ref 11.5–15.5)
WBC: 16.5 10*3/uL — AB (ref 4.0–10.5)

## 2015-08-02 LAB — GLUCOSE, CAPILLARY
GLUCOSE-CAPILLARY: 113 mg/dL — AB (ref 65–99)
GLUCOSE-CAPILLARY: 122 mg/dL — AB (ref 65–99)
Glucose-Capillary: 104 mg/dL — ABNORMAL HIGH (ref 65–99)

## 2015-08-02 LAB — URINE CULTURE: Culture: NO GROWTH

## 2015-08-02 MED ORDER — LEVOFLOXACIN 500 MG PO TABS: 500.0000 mg | ORAL_TABLET | Freq: Every day | ORAL | Status: AC

## 2015-08-02 MED ORDER — POTASSIUM CHLORIDE CRYS ER 20 MEQ PO TBCR
40.0000 meq | EXTENDED_RELEASE_TABLET | Freq: Once | ORAL | Status: AC
  Administered 2015-08-02: 40 meq via ORAL
  Filled 2015-08-02: qty 2

## 2015-08-02 NOTE — Progress Notes (Signed)
[facility] Progress Note     Subjective: 69 y/o female with a PMH of endometrial cancer, diverted colostomy, and mucous fistula, seen and examined at bedside today. Her daughter is in the room with her. She is pleasant and states she is feeling better. She is tolerating a soft diet and having flatus and liquid stool output. She reports that her ostomy bag has been nearly full with liquid stool and gas on two occasions. She continues to have a moderate amount of drainage from her fistula as well. She endorses vaginal and rectal pain, which is not new and is attributable to her cancer. She is urinating regularly, but reports hesitancy. She denies any fever, chills, night sweats, abdominal pain, N/V. She has no complaints today.   Objective: Vital signs in last 24 hours: Temp:  [98.3 F (36.8 C)-98.6 F (37 C)] 98.3 F (36.8 C) (09/19 0624) Pulse Rate:  [78-91] 90 (09/19 0624) Resp:  [14-16] 16 (09/19 0624) BP: (115-136)/(62-65) 136/62 mmHg (09/19 0624) SpO2:  [98 %-100 %] 98 % (09/19 0624) Last BM Date: 08/02/15  Intake/Output from previous day: 09/18 0701 - 09/19 0700 In: 720 [P.O.:720] Out: 360 [Urine:350; Stool:10] Intake/Output this shift:   PE: Gen:  Alert, NAD, pleasant Card:  RRR, no M/G/R heard Pulm:  CTA, no W/R/R Abd: Soft, NT, mildly distended, +BS, no HSM, ostomy bag on the left with minimal liquid stool, mucous fistula inferior to ostomy covered with a dressing with some brown, foul-smelling mucous. Ext:  No erythema, edema  Lab Results:   Recent Labs  08/01/15 0436 08/02/15 0454  WBC 17.2* 16.5*  HGB 9.3* 8.7*  HCT 29.7* 27.7*  PLT 268 248   BMET  Recent Labs  08/01/15 0436 08/02/15 0454  NA 141 137  K 3.6 3.3*  CL 110 107  CO2 23 23  GLUCOSE 91 108*  BUN 15 12  CREATININE 1.32* 1.24*  CALCIUM 8.7* 8.5*   PT/INR No results for input(s): LABPROT, INR in the last 72 hours. CMP     Component Value Date/Time   NA 137 08/02/2015 0454    NA 142 07/08/2015 1229   K 3.3* 08/02/2015 0454   K 3.5 07/08/2015 1229   CL 107 08/02/2015 0454   CL 104 04/29/2013 1229   CO2 23 08/02/2015 0454   CO2 23 07/08/2015 1229   GLUCOSE 108* 08/02/2015 0454   GLUCOSE 88 07/08/2015 1229   GLUCOSE 114* 04/29/2013 1229   BUN 12 08/02/2015 0454   BUN 22.3 07/08/2015 1229   CREATININE 1.24* 08/02/2015 0454   CREATININE 1.2* 07/08/2015 1229   CALCIUM 8.5* 08/02/2015 0454   CALCIUM 9.4 07/08/2015 1229   PROT 7.0 07/30/2015 1035   PROT 6.5 07/08/2015 1229   ALBUMIN 2.8* 07/30/2015 1035   ALBUMIN 2.9* 07/08/2015 1229   AST 28 07/30/2015 1035   AST 16 07/08/2015 1229   ALT 16 07/30/2015 1035   ALT 11 07/08/2015 1229   ALKPHOS 72 07/30/2015 1035   ALKPHOS 77 07/08/2015 1229   BILITOT 0.6 07/30/2015 1035   BILITOT 0.46 07/08/2015 1229   GFRNONAA 44* 08/02/2015 0454   GFRAA 51* 08/02/2015 0454   Lipase     Component Value Date/Time   LIPASE 13* 07/29/2015 2220     Studies/Results: No results found.  Anti-infectives: Anti-infectives    Start     Dose/Rate Route Frequency Ordered Stop   08/01/15 1700  levofloxacin (LEVAQUIN) IVPB 500 mg     500 mg 100 mL/hr over  60 Minutes Intravenous Every 48 hours 07/30/15 1617     07/31/15 1600  cefTRIAXone (ROCEPHIN) 1 g in dextrose 5 % 50 mL IVPB  Status:  Discontinued     1 g 100 mL/hr over 30 Minutes Intravenous Every 24 hours 07/31/15 1528 07/31/15 1534   07/31/15 0200  cefTRIAXone (ROCEPHIN) 1 g in dextrose 5 % 50 mL IVPB  Status:  Discontinued     1 g 100 mL/hr over 30 Minutes Intravenous Every 24 hours 07/30/15 0943 07/30/15 1556   07/30/15 1700  levofloxacin (LEVAQUIN) IVPB 750 mg     750 mg 100 mL/hr over 90 Minutes Intravenous  Once 07/30/15 1616 07/30/15 1856   07/30/15 0115  cefTRIAXone (ROCEPHIN) 1 g in dextrose 5 % 50 mL IVPB     1 g 100 mL/hr over 30 Minutes Intravenous  Once 07/30/15 0113 07/30/15 0259       Assessment/Plan Partial SBO secondary to adhesions -  resolved, from a surgical standpoint, the patient is ready for discharge. - advance diet as tolerated. Tolerating soft foods and making liquid stool. - consult [place] nurse for further counseling regarding mucous fistula management - follow up with Dr. [name] PRN   LOS: 3 days    Pam Vazquez 08/02/2015, 8:25 AM Pager: [phone]

## 2015-08-02 NOTE — Consult Note (Signed)
WOC ostomy consult note Stoma type/location: LLQ colostomy with MF. Asked to see today for instruction of stoma cap as a management strategy for MF.  Education provided: Patient and daughter shown demonstration of two types of stoma caps; patient is considering use of the smaller shown and 5 samples are provided. I demonstrate application and apply one to the MF.  She like the gas filter and the small nature of product and finds it would be easy to apply, however, the limitation may be the copay and number of supplies allowable per her insurance provider.  She is given support to phone her distributor and request an additional 5 samples so that she has 10 and can determine if this is what she would like to use. The other management strategy she has been using (a minipad) is clearly more economical and she is supported in her decision between the two options. Patient and daughter express relief that writer realizes that this really is her decision.  While they appreciate the information, will move forward with whichever they prefer and can afford. [place] nursing team will not follow, but will remain available to this patient, the nursing, surgical and medical teams.  Patient expresses that she is in severe pain and that she will be waiting the additional half-hour for her next dose of Tylenol prior to being discharged. Thanks, Maudie Flakes, MSN, RN, [place], [place], [place] [phone])

## 2015-08-02 NOTE — Care Management Note (Signed)
Case Management Note  Patient Details  Name: JENNAE HAKEEM MRN: [id number] Date of Birth: January 07, 1946  Subjective/Objective: 69 y/o f admitted w/n/v. From home. Indep PTA.                   Action/Plan:d/c home no needs or orders.   Expected Discharge Date:   (UNKNOWN)               Expected Discharge Plan:  Home/Self Care  In-House Referral:     Discharge planning Services  CM Consult  Post Acute Care Choice:    Choice offered to:     DME Arranged:    DME Agency:     HH Arranged:    [place] Agency:     Status of Service:  Completed, signed off  Medicare Important Message Given:    Date Medicare IM Given:    Medicare IM give by:    Date Additional Medicare IM Given:    Additional Medicare Important Message give by:     If discussed at [place] of Stay Meetings, dates discussed:    Additional Comments:  Dessa Phi, RN 08/02/2015, 10:37 AM

## 2015-08-02 NOTE — Care Management Important Message (Signed)
Important Message  Patient Details  Name: Pam Vazquez MRN: [id number] Date of Birth: 1946-01-06   Medicare Important Message Given:  [facility] notification given    Camillo Flaming 08/02/2015, 11:46 AMImportant Message  Patient Details  Name: Pam Vazquez MRN: [id number] Date of Birth: [birth year]   Medicare Important Message Given:  Yes-second notification given    Camillo Flaming 08/02/2015, 11:46 AM

## 2015-08-02 NOTE — Discharge Summary (Signed)
Physician Discharge Summary  Pam Vazquez RCV:[id number] DOB: [birth year] DOA: 07/29/2015  PCP: [name], MD  Admit date: 07/29/2015 Discharge date: 08/02/2015  Recommendations for Outpatient Follow-up:  1. Pt will need to follow up with PCP in 2-3 weeks post discharge 2. Please obtain BMP to evaluate electrolytes and kidney function 3. Please also check CBC to evaluate Hg and Hct levels 4. Pt to complete Levaquin for 5 more days post discharge   Discharge Diagnoses:  Principal Problem:   Nausea & vomiting Active Problems:   Cancer related pain   UTI (lower urinary tract infection)   ARF (acute renal failure)   Right middle lobe pneumonia   Sepsis due to pneumonia   SBO (small bowel obstruction)   Bilateral hydronephrosis   Anorexia   Cancer of endometrium   Anemia of chronic disease  Discharge Condition: Stable  Diet recommendation: Heart healthy diet discussed in details    Brief narrative:    69 y.o. female with history of endometrial cancer and history of colostomy placement presented to [facility] with main concern of several days duration of progressively worsening abd pain, throbbing and constant, 7/10 in severity, associated with nausea, cramps, non bloody vomiting.   In Ed, ptwas hemodynamically stable, VS notable for HR up to 110, blood work notable for WBC 15.8. UA suggestive of UTI, abd XRAY concerning for ileus. TRH asked to admit for further evaluation.   Assessment/Plan:    Principal Problem:  Nausea & vomiting - secondary to distal SBO, bilateral hydronephrosis, ? UTI and PNA - now better that SBO is resolved - advanced diet and pt tolerating well - surgery team following, cleared for discharge as tolerating diet   Active Problems:  Sepsis due to lobar PNA, RML PNA, UTI, unknown pathogens at this time  - continue Levaquin day #4 and continue for 5 more days post discharge  - pt remains afebrile and WBL trending down   Cancer related pain -  allow analgesia as needed    UTI (lower urinary tract infection) - Levaquin day #3, OK to continue  - follow up on urine culture    ARF (acute renal failure) - appears to be secondary to pre renal etiology - IVF have been provided and Cr continues trending down    Right middle lobe pneumonia - Levaquin should be adequate in coverage - pt denies productive cough at this time - continue Levaquin for 5 more days post discharge   SBO (small bowel obstruction) - surgery team following - advanced diet and pt tolerating well    Bilateral hydronephrosis - urology team consulted, conservative management for now  - no need for an intervention at this time    Anorexia, increase nutritional need in the setting of acute illness  - nutritionist consulted   Cancer of endometrium - history of cancer, followed with Dr [name]    Anemia of chronic disease, malignancy  - no signs of active bleeding   Code Status: DNR Family Communication: plan of care discussed with the patient and family at bedside  Disposition Plan: Home  IV access:  Peripheral IV  Procedures and diagnostic studies:   Ct Abdomen Pelvis Wo Contrast 07/30/2015 Distal small bowel obstruction, etiology uncertain. 2. New bilateral hydronephrosis and proximal ureterectasis. 3. Patchy medial right middle lobe airspace opacity, possibly early pneumonia but nonspecific. 4. 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   Dg Abd Acute W/chest 07/30/2015 Nondilated air-filled loops of small bowel throughout the abdomen  may represent a degree of ileus. Clinical correlation is recommended.   Medical Consultants:  Urology Surgery   Other Consultants:  None  IAnti-Infectives:   Levaquin 9/16 -->     Discharge Exam: Filed Vitals:   08/02/15 0624  BP: 136/62  Pulse: 90  Temp: 98.3 F (36.8 C)  Resp: 16   Filed Vitals:   08/01/15 0616 08/01/15 1400 08/01/15 2044  08/02/15 0624  BP: 126/63 115/63 130/65 136/62  Pulse: 93 78 91 90  Temp: 98.8 F (37.1 C) 98.6 F (37 C) 98.3 F (36.8 C) 98.3 F (36.8 C)  TempSrc: Oral Oral Oral Oral  Resp: 18 16 14 16   Height:      Weight:      SpO2: 98% 99% 100% 98%    General: Pt is alert, follows commands appropriately, not in acute distress Cardiovascular: Regular rate and rhythm, S1/S2 +, no murmurs, no rubs, no gallops Respiratory: Clear to auscultation bilaterally, no wheezing, no crackles, no rhonchi Abdominal: Soft, non tender, non distended, bowel sounds +, no guarding Extremities: no edema, no cyanosis, pulses palpable bilaterally DP and PT  Discharge Instructions  Discharge Instructions    Diet - low sodium heart healthy    Complete by:  As directed      Increase activity slowly    Complete by:  As directed             Medication List    STOP taking these medications        LORazepam 0.5 MG tablet  Commonly known as:  ATIVAN     tamoxifen 20 MG tablet  Commonly known as:  NOLVADEX      TAKE these medications        amLODipine 2.5 MG tablet  Commonly known as:  NORVASC  Take 2.5 mg by mouth every morning.     cholecalciferol 1000 UNITS tablet  Commonly known as:  VITAMIN D  Take 1,000 Units by mouth daily.     docusate sodium 100 MG capsule  Commonly known as:  COLACE  Take 100 mg by mouth daily.     fentaNYL 100 MCG/HR  Commonly known as:  DURAGESIC  Use 100 mcg patch with 75 mcg patch for a total dose of 175 mcg every 72 hrs for pain     ferrous fumarate 325 (106 FE) MG Tabs tablet  Commonly known as:  HEMOCYTE - 106 mg FE  Take 1 tablet (106 mg of iron total) by mouth daily. Take on empty stomach with OJ     gabapentin 300 MG capsule  Commonly known as:  NEURONTIN  Take 300 mg by mouth 3 (three) times daily.     HEMATINIC PLUS COMPLEX PO  Take 1 tablet by mouth daily.     levofloxacin 500 MG tablet  Commonly known as:  LEVAQUIN  Take 1 tablet (500 mg total)  by mouth daily.     lidocaine-prilocaine cream  Commonly known as:  EMLA  Apply topically as needed.     megestrol 400 MG/10ML suspension  Commonly known as:  MEGACE  Take 10 mLs (400 mg total) by mouth 2 (two) times daily.     metoprolol 50 MG tablet  Commonly known as:  LOPRESSOR  Take 50 mg by mouth every morning.     multivitamin with minerals Tabs tablet  Take 1 tablet by mouth every morning. She takes [employer] movement psychotherapist.     naproxen sodium 220 MG tablet  Commonly known as:  ANAPROX  Take 220 mg by mouth 3 (three) times daily.     ondansetron 8 MG tablet  Commonly known as:  ZOFRAN  Take 1 tablet (8 mg total) by mouth every 8 (eight) hours as needed for nausea or vomiting. Will not make drowsy     Oxycodone HCl 10 MG Tabs  Take 1-2 tablets by mouth every 3-4 hours as needed for pain.              Follow-up Information    Follow up with [name],[name], MD.   Specialty:  Family Medicine   Contact information:   [place] [address] [phone]       Follow up with Faye Ramsay, MD.   Specialty:  Internal Medicine   Why:  As needed   Contact information:   [address] [place] [address] [phone]        The results of significant diagnostics from this hospitalization (including imaging, microbiology, ancillary and laboratory) are listed below for reference.     Microbiology: Recent Results (from the past 240 hour(s))  Culture, expectorated sputum-assessment     Status: None   Collection Time: 07/31/15  7:12 PM  Result Value Ref Range Status   Specimen Description SPUTUM  Final   Special Requests NONE  Final   Sputum evaluation   Final    MICROSCOPIC FINDINGS SUGGEST THAT THIS SPECIMEN IS NOT REPRESENTATIVE OF LOWER RESPIRATORY SECRETIONS. PLEASE RECOLLECT. NOTIFIED [place] AT [id number] ON [id number] BY HOOKER,B    Report Status 07/31/2015 FINAL  Final     Labs: Basic Metabolic Panel:  Recent  Labs Lab 07/29/15 2220 07/30/15 1035 07/31/15 0850 08/01/15 0436 08/02/15 0454  NA 143 142 141 141 137  K 3.7 4.0 3.3* 3.6 3.3*  CL 107 107 108 110 107  CO2 24 25 25 23 23   GLUCOSE 153* 157* 109* 91 108*  BUN 23* 25* 22* 15 12  CREATININE 2.00* 1.84* 1.46* 1.32* 1.24*  CALCIUM 9.2 9.0 8.9 8.7* 8.5*   Liver Function Tests:  Recent Labs Lab 07/29/15 2220 07/30/15 1035  AST 25 28  ALT 12* 16  ALKPHOS 84 72  BILITOT 0.9 0.6  PROT 7.6 7.0  ALBUMIN 3.2* 2.8*    Recent Labs Lab 07/29/15 2220  LIPASE 13*   No results for input(s): AMMONIA in the last 168 hours. CBC:  Recent Labs Lab 07/29/15 2220 07/30/15 1035 08/01/15 0436 08/02/15 0454  WBC 15.8* 11.1* 17.2* 16.5*  NEUTROABS 14.0* 9.4*  --   --   HGB 10.1* 9.8* 9.3* 8.7*  HCT 31.8* 30.7* 29.7* 27.7*  MCV 95.5 95.6 94.9 94.9  PLT 313 265 268 248   CBG:  Recent Labs Lab 08/01/15 0612 08/01/15 1219 08/01/15 1701 08/02/15 0008 08/02/15 0619  GLUCAP 104* 119* 148* 113* 104*     SIGNED: Time coordinating discharge: 30 minutes  MAGICK-MYERS, ISKRA, MD  [facility] 08/02/2015, 8:38 AM Pager [phone]  If 7PM-7AM, please contact night-coverage [email or web address] Password [email or web address]

## 2015-08-02 NOTE — Progress Notes (Signed)
Pt d/c home. Patient has been complaining of low back pain today, more severe than it has been. Patient medicated several times and been using kpad, with no relief. Dr. Doyle Askew notified of the pain, per pt request. Dr. Doyle Askew returned call and encouraged patient to walk, as tolerated. Dr Doyle Askew gave patient her cell phone number if the patient had further concerns, but that no acute problem was present. Patient felt better after hearing from Dr. Doyle Askew.

## 2015-08-02 NOTE — Progress Notes (Signed)
Initial Nutrition Assessment  INTERVENTION:   Encourage PO intake RD to continue to monitor for additional needs  NUTRITION DIAGNOSIS:   Increased nutrient needs related to cancer and cancer related treatments as evidenced by estimated needs.  GOAL:   Patient will meet greater than or equal to 90% of their needs  MONITOR:   PO intake, Labs, Weight trends, Skin, I & O's  REASON FOR ASSESSMENT:   Consult Assessment of nutrition requirement/status  ASSESSMENT:   69 y.o. female with history of endometrial cancer and history of colostomy placement process to the ER because of nausea vomiting since yesterday. Patient had 3 episodes of nausea vomiting which was nonbloody yesterday and had one episode in the ER this morning.   Pt reports poor appetite but this has improved. Pt ate most of her breakfast this morning (PO intake: 100%).  Per weight history, pt has lost 14 lb since 2/04 (10% weight loss x 7.5 months, insignificant for time frame). Pt does not want supplements at this time.  Nutrition-Focused physical exam completed. Findings are no fat depletion, mild-moderate muscle depletion, and no edema.   Labs reviewed: CBGs: 104-122 Low K Elevated Creatinine  Diet Order:  DIET SOFT Room service appropriate?: Yes; Fluid consistency:: Thin Diet - low sodium heart healthy  Skin:  Wound (see comment) (abdominal incision)  Last BM:  9/19  Height:   Ht Readings from Last 1 Encounters:  07/30/15 5' (1.524 m)    Weight:   Wt Readings from Last 1 Encounters:  07/30/15 121 lb 14.3 oz (55.29 kg)    Ideal Body Weight:  45.5 kg  BMI:  Body mass index is 23.81 kg/(m^2).  Estimated Nutritional Needs:   Kcal:  1700-1900  Protein:  80-90g  Fluid:  1.8L/day  EDUCATION NEEDS:   No education needs identified at this time  Clayton Bibles, MS, RD, LDN Pager: [phone] After Hours Pager: [phone]

## 2015-08-02 NOTE — Discharge Instructions (Signed)
Hydronephrosis Hydronephrosis is an abnormal enlargement of your kidney. It can affect one or both the kidneys. It results from the backward pressure of urine on the kidneys, when the flow of urine is blocked. Normally, the urine drains from the kidney through the urine tube (ureter), into a sac which holds the urine until urination (bladder). When the urinary flow is blocked, the urine collects above the block. This causes an increase in the pressure inside the kidney, which in turn leads to its enlargement. The block can occur at the point where the kidney joins the ureter. Treatment depends on the cause and location of the block.  CAUSES  The causes of this condition include:  Birth defect of the kidney or ureter.  Kink at the point where the kidney joins the ureter.  Stones and blood clots in the kidney or ureter.  Cancer, injury, or infection of the ureter.  Scar tissue formation.  Backflow of urine (reflux).  Cancer of bladder or prostate gland.  Abnormality of the nerves or muscles of the kidney or ureter.  Lower part of the ureter protruding into the bladder (ureterocele).  Abnormal contractions of the bladder.  Both the kidneys can be affected during pregnancy. This is because the enlarging uterus presses on the ureters and blocks the flow of urine. SYMPTOMS  The symptoms depend on the location of the block. They also depend on how long the block has been present. You may feel pain on the affected side. Sometimes, you may not have any symptoms. There may be a dull ache or discomfort in the flank. The common symptoms are:  Flank pain.  Swelling of the abdomen.  Pain in the abdomen.  Nausea and vomiting.  Fever.  Pain while passing urine.  Urgency for urination.  Frequent or urgent urination.  Infection of the urinary tract. DIAGNOSIS  Your caregiver will examine you after asking about your symptoms. You may be asked to do blood and urine tests. Your caregiver  may order a special X-ray, ultrasound, or CT scan. Sometimes a rigid or flexible telescope (cystoscope) is used to view the site of the blockage.  TREATMENT  Treatment depends on the site, cause, and duration of the block. The goal of treatment is to remove the blockage. Your caregiver will plan the treatment based on your condition. The different types of treatment are:   Putting in a soft plastic tube (ureteral stent) to connect the bladder with the kidney. This will help in draining the urine.  Putting in a soft tube (nephrostomy tube). This is placed through skin into the kidney. The trapped urine is drained out through the back. A plastic bag is attached to your skin to hold the urine that has drained out.  Antibiotics to treat or prevent infection.  Breaking down of the stone (lithotripsy). HOME CARE INSTRUCTIONS   It may take some time for the hydronephrosis to go away (resolve). Drink fluids as directed by your caregiver , and get a lot of rest.  If you have a drain in, your caregiver will give you directions about how to care for it. Be sure you understand these directions completely before you go home.  Take any antibiotics, pain medications, or other prescriptions exactly as prescribed.  Follow-up with your caregivers as directed. SEEK MEDICAL CARE IF:   You continue to have flank pain, nausea, or difficulty with urination.  You have any problem with any type of drainage device.  Your urine becomes cloudy or bloody. SEEK  IMMEDIATE MEDICAL CARE IF:   You have severe flank and/or abdominal pain.  You develop vomiting and are unable to hold down fluids.  You develop a fever above 100.5 F (38.1 C), or as per your caregiver. MAKE SURE YOU:   Understand these instructions.  Will watch your condition.  Will get help right away if you are not doing well or get worse. Document Released: 08/27/2007 Document Revised: 01/22/2012 Document Reviewed: 10/13/2010 [facility]  Patient Information 2015 [place], [place]. This information is not intended to replace advice given to you by your health care provider. Make sure you discuss any questions you have with your health care provider. Small Bowel Obstruction A small bowel obstruction is a blockage (obstruction) of the small intestine (small bowel). The small bowel is a long, slender tube that connects the stomach to the colon. Its job is to absorb nutrients from the fluids and foods you consume into the bloodstream.  CAUSES  There are many causes of intestinal blockage. The most common ones include:  Hernias. This is a more common cause in children than adults.  Inflammatory bowel disease (enteritis and colitis).  Twisting of the bowel (volvulus).  Tumors.  Scar tissue (adhesions) from previous surgery or radiation treatment.  Recent surgery. This may cause an acute small bowel obstruction called an ileus. SYMPTOMS   Abdominal pain. This may be dull cramps or sharp pain. It may occur in one area or may be present in the entire abdomen. Pain can range from mild to severe, depending on the degree of obstruction.  Nausea and vomiting. Vomit may be greenish or yellow bile color.  Distended or swollen stomach. Abdominal bloating is a common symptom.  Constipation.  Lack of passing gas.  Frequent belching.  Diarrhea. This may occur if runny stool is able to leak around the obstruction. DIAGNOSIS  Your caregiver can usually diagnose small bowel obstruction by taking a history, doing a physical exam, and taking X-rays. If the cause is unclear, a CT scan (computerized tomography) of your abdomen and pelvis may be needed. TREATMENT  Treatment of the blockage depends on the cause and how bad the problem is.   Sometimes, the obstruction improves with bed rest and intravenous (IV) fluids.  Resting the bowel is very important. This means following a simple diet. Sometimes, a clear liquid diet may be required for  several days.  Sometimes, a small tube (nasogastric tube) is placed into the stomach to decompress the bowel. When the bowel is blocked, it usually swells up like a balloon filled with air and fluids. Decompression means that the air and fluids are removed by suction through that tube. This can help with pain, discomfort, and nausea. It can also help the obstruction resolve faster.  Surgery may be required if other treatments do not work. Bowel obstruction from a hernia may require early surgery and can be an emergency procedure. Adhesions that cause frequent or severe obstructions may also require surgery. HOME CARE INSTRUCTIONS If your bowel obstruction is only partial or incomplete, you may be allowed to go home.  Get plenty of rest.  Follow your diet as directed by your caregiver.  Only consume clear liquids until your condition improves.  Avoid solid foods as instructed. SEEK IMMEDIATE MEDICAL CARE IF:  You have increased pain or cramping.  You vomit blood.  You have uncontrolled vomiting or nausea.  You cannot drink fluids due to vomiting or pain.  You develop confusion.  You begin feeling very dry or thirsty (  dehydrated).  You have severe bloating.  You have chills.  You have a fever.  You feel extremely weak or you faint. MAKE SURE YOU:  Understand these instructions.  Will watch your condition.  Will get help right away if you are not doing well or get worse. Document Released: 01/16/2006 Document Revised: 01/22/2012 Document Reviewed: 01/13/2011 [facility] Patient Information 2015 [place], [place]. This information is not intended to replace advice given to you by your health care provider. Make sure you discuss any questions you have with your health care provider.

## 2015-08-04 ENCOUNTER — Telehealth: Payer: Self-pay

## 2015-08-04 ENCOUNTER — Telehealth: Payer: Self-pay | Admitting: Oncology

## 2015-08-04 LAB — LEGIONELLA ANTIGEN, URINE

## 2015-08-04 NOTE — Telephone Encounter (Signed)
-----   Message from Gordy Levan, MD sent at 08/01/2015  8:30 PM EDT ----- She may be DC before I can see her in hospital. If I don't see her there, could RN please call to check on her later this week- can offer moving my apt earlier, to 9-30 if she prefers.  Thank you

## 2015-08-04 NOTE — Telephone Encounter (Signed)
Pam Vazquez states that she is doing better now.  She has a follow up appointment with her PCP tomorrow. She does want to move her appointment from 08-26-15 to 08-13-15 as noted below by Dr. [name]. Gave information to Webb Silversmith in scheduling and she will call Ms. [name] with appointment times.

## 2015-08-04 NOTE — Telephone Encounter (Signed)
Called and spoke with patient and she is aware of her new appointment per louise dr ll message

## 2015-08-10 ENCOUNTER — Other Ambulatory Visit: Payer: Self-pay | Admitting: Oncology

## 2015-08-10 DIAGNOSIS — C541 Malignant neoplasm of endometrium: Secondary | ICD-10-CM

## 2015-08-13 ENCOUNTER — Ambulatory Visit: Payer: Medicare Other

## 2015-08-13 ENCOUNTER — Encounter: Payer: Self-pay | Admitting: Oncology

## 2015-08-13 ENCOUNTER — Other Ambulatory Visit (HOSPITAL_BASED_OUTPATIENT_CLINIC_OR_DEPARTMENT_OTHER): Payer: Medicare Other

## 2015-08-13 ENCOUNTER — Ambulatory Visit (HOSPITAL_BASED_OUTPATIENT_CLINIC_OR_DEPARTMENT_OTHER): Payer: Medicare Other | Admitting: Oncology

## 2015-08-13 ENCOUNTER — Other Ambulatory Visit: Payer: Self-pay | Admitting: *Deleted

## 2015-08-13 VITALS — BP 148/75 | HR 91 | Temp 98.0°F | Resp 18 | Ht 60.0 in | Wt 122.4 lb

## 2015-08-13 DIAGNOSIS — F329 Major depressive disorder, single episode, unspecified: Secondary | ICD-10-CM

## 2015-08-13 DIAGNOSIS — Z95828 Presence of other vascular implants and grafts: Secondary | ICD-10-CM | POA: Insufficient documentation

## 2015-08-13 DIAGNOSIS — G893 Neoplasm related pain (acute) (chronic): Secondary | ICD-10-CM

## 2015-08-13 DIAGNOSIS — R52 Pain, unspecified: Secondary | ICD-10-CM

## 2015-08-13 DIAGNOSIS — K5669 Other intestinal obstruction: Secondary | ICD-10-CM

## 2015-08-13 DIAGNOSIS — C7989 Secondary malignant neoplasm of other specified sites: Secondary | ICD-10-CM | POA: Diagnosis not present

## 2015-08-13 DIAGNOSIS — N1330 Unspecified hydronephrosis: Secondary | ICD-10-CM | POA: Diagnosis not present

## 2015-08-13 DIAGNOSIS — C541 Malignant neoplasm of endometrium: Secondary | ICD-10-CM

## 2015-08-13 DIAGNOSIS — R630 Anorexia: Secondary | ICD-10-CM

## 2015-08-13 DIAGNOSIS — R32 Unspecified urinary incontinence: Secondary | ICD-10-CM

## 2015-08-13 DIAGNOSIS — Z23 Encounter for immunization: Secondary | ICD-10-CM | POA: Diagnosis not present

## 2015-08-13 DIAGNOSIS — Z8719 Personal history of other diseases of the digestive system: Secondary | ICD-10-CM | POA: Insufficient documentation

## 2015-08-13 LAB — CBC WITH DIFFERENTIAL/PLATELET
BASO%: 0.2 % (ref 0.0–2.0)
BASOS ABS: 0 10*3/uL (ref 0.0–0.1)
EOS ABS: 0.1 10*3/uL (ref 0.0–0.5)
EOS%: 0.4 % (ref 0.0–7.0)
HEMATOCRIT: 31.7 % — AB (ref 34.8–46.6)
HGB: 9.9 g/dL — ABNORMAL LOW (ref 11.6–15.9)
LYMPH#: 1.9 10*3/uL (ref 0.9–3.3)
LYMPH%: 9.6 % — AB (ref 14.0–49.7)
MCH: 30.5 pg (ref 25.1–34.0)
MCHC: 31.2 g/dL — AB (ref 31.5–36.0)
MCV: 97.5 fL (ref 79.5–101.0)
MONO#: 1.2 10*3/uL — AB (ref 0.1–0.9)
MONO%: 6.1 % (ref 0.0–14.0)
NEUT#: 16.9 10*3/uL — ABNORMAL HIGH (ref 1.5–6.5)
NEUT%: 83.7 % — AB (ref 38.4–76.8)
PLATELETS: 464 10*3/uL — AB (ref 145–400)
RBC: 3.25 10*6/uL — AB (ref 3.70–5.45)
RDW: 16.9 % — ABNORMAL HIGH (ref 11.2–14.5)
WBC: 20.2 10*3/uL — ABNORMAL HIGH (ref 3.9–10.3)

## 2015-08-13 LAB — BASIC METABOLIC PANEL (CC13)
Anion Gap: 9 mEq/L (ref 3–11)
BUN: 25.8 mg/dL (ref 7.0–26.0)
CO2: 24 mEq/L (ref 22–29)
CREATININE: 1.2 mg/dL — AB (ref 0.6–1.1)
Calcium: 9.9 mg/dL (ref 8.4–10.4)
Chloride: 110 mEq/L — ABNORMAL HIGH (ref 98–109)
EGFR: 48 mL/min/{1.73_m2} — ABNORMAL LOW (ref >90)
GLUCOSE: 104 mg/dL (ref 70–140)
Potassium: 3.8 mEq/L (ref 3.5–5.1)
Sodium: 143 mEq/L (ref 136–145)

## 2015-08-13 MED ORDER — SODIUM CHLORIDE 0.9 % IJ SOLN
10.0000 mL | INTRAMUSCULAR | Status: DC | PRN
  Administered 2015-08-13: 10 mL via INTRAVENOUS
  Filled 2015-08-13: qty 10

## 2015-08-13 MED ORDER — INFLUENZA VAC SPLIT QUAD 0.5 ML IM SUSY
0.5000 mL | PREFILLED_SYRINGE | Freq: Once | INTRAMUSCULAR | Status: AC
  Administered 2015-08-13: 0.5 mL via INTRAMUSCULAR
  Filled 2015-08-13: qty 0.5

## 2015-08-13 MED ORDER — HEPARIN SOD (PORK) LOCK FLUSH 100 UNIT/ML IV SOLN
500.0000 [IU] | Freq: Once | INTRAVENOUS | Status: AC
  Administered 2015-08-13: 500 [IU] via INTRAVENOUS
  Filled 2015-08-13: qty 5

## 2015-08-13 MED ORDER — FENTANYL 100 MCG/HR TD PT72: MEDICATED_PATCH | TRANSDERMAL | Status: AC

## 2015-08-13 MED ORDER — OXYCODONE HCL 10 MG PO TABS: ORAL_TABLET | ORAL | Status: AC

## 2015-08-13 NOTE — Patient Instructions (Signed)

## 2015-08-13 NOTE — Progress Notes (Signed)
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   Patient was hospitalized at [facility] from 9-15 thru 08-02-15 with CT AP done,  seen by urology and general surgery but no aggressive interventions pursued for the ureteral obstruction or SBO. The bowel obstruction did improve with bowel rest. She was seen by ostomy nurse in hospital and is now using appliance to the mucous fistula, which is more satisfactory. She has had 2 episodes of vomiting since home from hospital, is eating and drinking very little and is generally weak. Bowels have been moving via ostomy. She is voiding, but has cramping pelvic pain after she voids. She has had no fever and denies increased SOB. She is using oxycodone or tylenol or aleve every 4 hrs for breakthru pain in addition to duragesic 175 micrograms every 72 hrs; we have discussed fact that tylenol and the aleve are probably not giving as much benefit as other pain meds now. She has had no significant bleeding. She has pain in left knee on ambulation and feels that it might give way, has walker at home which I have encouraged her to use. Daughter Lattie Haw is on medical leave from work thru Oct 17, so has been staying with her, and daughter Lynelle Smoke also closely involved.    Flu vaccine given today PAC in, flushed today  ONCOLOGIC HISTORY Patient had robotic assisted total laparoscopic hysterectomy bilateral salpingo-oophorectomy right pelvic lymph node biopsy on 12/19/2011. 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 There was no involvement of other organs lymphovascular  invasion was not identified. 3 lymph nodes were negative for metastatic disease. 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  Patient was seen by Dr. [name] on 04/29/2013 with a nodule in the vagina concerning for recurrence as well as 8 cm rectal mass. CT of the abdomen and pelvis showed a lobular mass at the vagina cuff measuring 7.0 x 3.2 cm proximally 4 cm craniocaudal dimension, with compression of the distal aspect of the sigmoid colon just above the rectum . There was also noted to be a large necrotic lymph node measuring 2.8 cm within the sigmoid mesocolon centrally. There are also small bilateral external iliac lymph nodes. There were no aggressive osseous lesions. 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  Patient received 6 cycles of taxol carboplatin from 06/03/2013 thru [id number]//[id number], complicated by some peripheral neuropathy. There was an interval decrease in tumor burden therefore Dr. [name] recommended 3 additional cycles of Taxotere/Carboplatin, received from 11/27/13 through 01/15/14.  CT of the chest, abdomen, and pelvis on 02/10/14 demonstrated disease progression. She had radiation by Dr Sondra Come, [place] given from 02/23/14 thru 03-27-14. PET September 2015 had increased uptake in central pelvis involving the sigmoid colon, with rectovaginal mass confirmed on exam by gyn oncology. Patient declined total pelvic exenteration. Colonoscopy 09-08-14 confirmed near complete rectal obstruction from external mass. She had diverting colostomy by Dr [name] 09-24-14, no disease visualized outside of pelvis. She had 4 cycles of doxil from 11-03-14 thru 01-26-15, tolerated well.  CT AP 02-15-15 had no disease outside of pelvis and some increase in the involved areas at vaginal cuff and right iliacus compared  with scans from July and Sept 2015. She completed 6 cycles of doxil on 03-22-15. CT AP 03-24-15 was stable compared with the month prior.She began alternating megace with tamoxifen every 3 weeks on 04-15-15, preferred to stay on megace only as of late Aug 2016. CT AP 07-30-15 during hospitalization showed new bilateral hydronephrosis and new distal small bowel obstruction, with tumor mass filling pelvis.    Review of systems as above, also: Denies cough or chest pain. Did sleep last PM. No problems with PAC. Dark Tammie Yanda drainage from the mucous fistula. Some pain hips intermittently. In bed a good bit at home. Does not recall going to Dr Scotty Court 2 days after hospital DC. Remainder of 10 point Review of Systems negative.  Objective:  Vital signs in last 24 hours:  BP 148/75 mmHg  Pulse 91  Temp(Src) 98 F (36.7 C) (Oral)  Resp 18  Ht 5' (1.524 m)  Wt 122 lb 6.4 oz (55.52 kg)  BMI 23.90 kg/m2  SpO2 100% Weight down 3 lbs from 07-08-15. Looks much weaker and more frail, difficulty talking due to dry mouth but refuses anything to drink now. Not in acute discomfort. Respirations not labored RA Alert, oriented and appropriate. Ambulatory with assistance.[name]    HEENT:PERRL, sclerae not icteric. Oral mucosa somewhat dry without lesions, posterior pharynx likewise.  Neck supple. No JVD.  Lymphatics:no cervical,surpaclavicular adenopathy Resp: clear to auscultation bilaterally  Cardio: regular rate and rhythm. No gallop. GI: soft, nontender, full. Quiet. Liquid stool in ostomy, mucous fistula bag empty. Musculoskeletal/ Extremities: without pitting edema, cords, tenderness. Left knee without erythema, swelling or tenderness to palpation.  Neuro: Speech fluent and appropriate. Moves all extremities equally Skin without rash, ecchymosis, petechiae Portacath-without erythema or tenderness  Lab Results:  Results for orders placed or performed in visit on 08/13/15  CBC with Differential  Result Value  Ref Range   WBC 20.2 (H) 3.9 - 10.3 10e3/uL   NEUT# 16.9 (H) 1.5 - 6.5 10e3/uL   HGB 9.9 (L) 11.6 - 15.9 g/dL   HCT 31.7 (L) 34.8 - 46.6 %   Platelets 464 (H) 145 - 400 10e3/uL   MCV 97.5 79.5 - 101.0 fL   MCH 30.5 25.1 - 34.0 pg   MCHC 31.2 (L) 31.5 - 36.0 g/dL   RBC 3.25 (L) 3.70 - 5.45 10e6/uL   RDW 16.9 (H) 11.2 - 14.5 %   lymph# 1.9 0.9 - 3.3 10e3/uL   MONO# 1.2 (H) 0.1 - 0.9 10e3/uL   Eosinophils Absolute 0.1 0.0 - 0.5 10e3/uL   Basophils Absolute 0.0 0.0 - 0.1 10e3/uL   NEUT% 83.7 (H) 38.4 - 76.8 %   LYMPH% 9.6 (L) 14.0 - 49.7 %   MONO% 6.1 0.0 - 14.0 %   EOS% 0.4 0.0 - 7.0 %   BASO% 0.2 0.0 - 2.0 %  Basic metabolic panel (Bmet) - [facility]  Result Value Ref Range   Sodium 143 136 - 145 mEq/L   Potassium 3.8 3.5 - 5.1 mEq/L   Chloride 110 (H) 98 - 109 mEq/L   CO2 24 22 - 29 mEq/L   Glucose 104 70 - 140 mg/dl   BUN 25.8 7.0 - 26.0 mg/dL   Creatinine 1.2 (H) 0.6 - 1.1 mg/dL   Calcium 9.9 8.4 - 10.4 mg/dL   Anion Gap 9 3 - 11 mEq/L   EGFR  48 (L) >90 ml/min/1.73 m2     Studies/Results:   EXAM: CT ABDOMEN AND PELVIS WITHOUT CONTRAST  COMPARISON: 03/24/2015 and earlier studies  FINDINGS: Patchy airspace opacity medially in the right middle lobe, new since previous exam.  Coronary and aortoiliac arterial calcifications.  Unremarkable liver, spleen, adrenal glands. Gallbladder physiologically distended. Diffuse pancreatic parenchymal atrophy. Unenhanced CT was performed per clinician order. Lack of IV contrast limits sensitivity and specificity, especially for evaluation of abdominal/pelvic solid viscera. Chronic patchy right renal parenchymal loss. Bilateral hydronephrosis and ureterectasis, progressive since previous exam. The ureters cannot be followed below the level of the sacroiliac joints.  Lumbar spine fixation hardware results in streak artifact degrading some of the images.  Stomach is distended by ingested material. Multiple dilated  small bowel loops. 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 Terminal ileum however is decompressed. The colon is nondilated. Enlarged uterus. There is pelvic mass at the level of the lower uterine segment which extends nearly to the pelvic sidewalls bilaterally, with central necrotic portions in small gas bubbles. Urinary bladder is nondistended. No ascites. No free air. Subcentimeter left para-aortic and aortocaval lymph nodes.  IMPRESSION: 1. Distal small bowel obstruction, etiology uncertain. 2. New bilateral hydronephrosis and proximal ureterectasis. 3. Patchy medial right middle lobe airspace opacity, possibly early pneumonia but nonspecific. 4. 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  PACs images reviewed with patient and daughters now.    Medications: I have reviewed the patient's current medications. Increase duragesic to 200 mcg every 72 hrs. Use oxycodone in preference to tylenol or aleve.   DISCUSSION: progression of metastatic endometrial cancer discussed, including extent of disease in pelvis causing bladder compromise and ureteral obstruction as well as pain, recent bowel obstruction, general marked decline. have told them that she has really did remarkably well following diverting colostomy 09-2014, but I do not expect that situation with cancer can be improved now.    I have told them that she is not able to take further chemotherapy, even if reasonable options for that were available. I have told them that the poor appetite and poor energy likely would not improve with antidepressant, and that side effects of those drugs are possible. We have discussed primarily liquid diet if symptoms of obstruction. Will increase in duragesic as noted. We have discussed FMLA for daughters. I have recommended that we ask  Hospice to assist, as comfort interventions are priority now. Hospice could use PAC for symptom management if other meds not sufficiently helpful. They have had experience with [facility] with her husband's illness, and are very fond of Dr Sonny Dandy who was her husband's oncologist. The support from Hospice will be very beneficial for patient and family. She is not a candidate for surgery.  Use walker to take stress off knee and for safety. Increase in pain medication may be helpful.  I have spoken directly with [facility] now [phone]). They have asked me to provide family with their 24 hour emergency # and will plan to meet with patient and family @ 10:00 on 08-16-15. We will fax records to [phone]  Assessment/Plan: 1.poorly differentiated endometrial carcinoma IA, grade 1 with no high risk features at surgery 12-2011, then recurrent to vagina 04-2013 and progressive in pelvis despite chemo and RT. 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 Doxil x6 cycles  from 11-03-14 thru 03-22-15, then progression on alternating megace and tamoxifen. CT AP 07-30-15 has extensive pelvic involvement with bilateral hydronephrosis; SBO at time of that CT improved with bowel rest. She and family understand that she will not benefit from further chemotherapy or surgery, and are in agreement with referral to [facility], done now. I will see her back on as needed basis if they or Hospice request. Increase pain medication as noted 2.PAC in, flushed today 3.multifactoria anemia, not acutely symptomatic. Would not follow routine labs now  4.difficulty sleeping and depression: also multifactorial. 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 9. Bilateral  hydronephrosis: agree with no attempt at stenting and no PCNs 10.Flu vaccine given now   All questions answered.  Time spent 35 min including >50% counseling and coordination of care. [facility] and Drs Scotty Court, Junious Silk, D.Leslie Dales.   LIVESAY,LENNIS P, MD   08/13/2015, 3:17 PM

## 2015-08-16 ENCOUNTER — Telehealth: Payer: Self-pay | Admitting: Oncology

## 2015-08-16 NOTE — Telephone Encounter (Signed)
Faxed 08/13/15 office note to [facility]

## 2015-08-18 ENCOUNTER — Telehealth: Payer: Self-pay | Admitting: Oncology

## 2015-08-18 NOTE — Telephone Encounter (Signed)
Medical Oncology  Beth RN with [facility] called back. Patient had use ~ 5 of the oxycodone and ~ 5 tylenol in addition to increase in fentanyl to 200 mcg, still with pain.  NKA and patient/ daughters told RN no known problems with morphine. Will try morphine immediate release 7.5 - 15 mg every 4 hrs prn. Hospice will contact pharmacy, likely pharmacy will fax order to this MD to sign, fax given [phone].  Beth aware MD can sign order on 10-6 or can make other arrangements if needed prior.  [name], MD

## 2015-08-18 NOTE — Telephone Encounter (Signed)
Medical Oncology  LM for Beth, admission RN with [facility], with my office cell for her to call back to discuss pain meds.  [name], MD

## 2015-08-19 ENCOUNTER — Other Ambulatory Visit: Payer: Self-pay

## 2015-08-19 DIAGNOSIS — C541 Malignant neoplasm of endometrium: Secondary | ICD-10-CM

## 2015-08-19 DIAGNOSIS — C7989 Secondary malignant neoplasm of other specified sites: Secondary | ICD-10-CM

## 2015-08-19 MED ORDER — MORPHINE SULFATE 15 MG PO TABS: ORAL_TABLET | ORAL | Status: AC

## 2015-08-23 ENCOUNTER — Telehealth: Payer: Self-pay

## 2015-08-23 NOTE — Telephone Encounter (Signed)
Faxed signed orders sated 08-23-15 to [facility].  Sent a copy to HIM to be scanned into patient's EMR.

## 2015-08-26 ENCOUNTER — Ambulatory Visit: Payer: Medicare Other | Admitting: Oncology

## 2015-08-26 ENCOUNTER — Other Ambulatory Visit: Payer: Medicare Other

## 2015-09-13 ENCOUNTER — Encounter: Payer: Self-pay | Admitting: Oncology

## 2015-09-13 NOTE — Progress Notes (Signed)
I placed fmla forms for lisa at desk of nurse for dr. [name].

## 2015-09-14 ENCOUNTER — Encounter: Payer: Self-pay | Admitting: Oncology

## 2015-09-14 NOTE — Progress Notes (Signed)
I faxed forms to Lattie Haw -- email would not go thru   [phone]

## 2015-09-14 NOTE — Progress Notes (Signed)
I emailed the forms to Harrold Donath for her mom. [email or web address] [email or web address].

## 2015-09-22 ENCOUNTER — Encounter: Payer: Self-pay | Admitting: Internal Medicine

## 2015-10-22 ENCOUNTER — Encounter: Payer: Self-pay | Admitting: Internal Medicine

## 2015-11-14 DEATH — deceased

## 2015-12-15 DEATH — deceased

## 2016-03-15 ENCOUNTER — Encounter: Payer: Self-pay | Admitting: Oncology

## 2016-03-15 NOTE — Progress Notes (Signed)
Faxed done 09/06/15 I sent to medical records

## 2016-07-05 ENCOUNTER — Encounter: Payer: Self-pay | Admitting: Internal Medicine

## 2016-10-27 ENCOUNTER — Other Ambulatory Visit: Payer: Self-pay | Admitting: Nurse Practitioner

## 2017-01-04 IMAGING — CT CT ABD-PELV W/ CM
2 of 5 series · 16 of 46 positions shown, 18 images · IV contrast (OMNIPAQUE)
Comparison: PET-CT from 07/22/2014.  CT scan from 05/19/2014.

CLINICAL DATA: Subsequent encounter for endometrial cancer with
metastatic disease to the pelvis.

EXAM:
CT ABDOMEN AND PELVIS WITH CONTRAST
TECHNIQUE: Multidetector CT imaging of the abdomen and pelvis was performed
using the standard protocol following bolus administration of
intravenous contrast.
CONTRAST:  100mL OMNIPAQUE IOHEXOL 300 MG/ML  SOLN

[Series 3: rtn a/p with · axial · 0.67mm/px · z∈[-338,+12]mm · 13 of 80 slices shown, 15 images]
[im 5/80  soft-tissue]
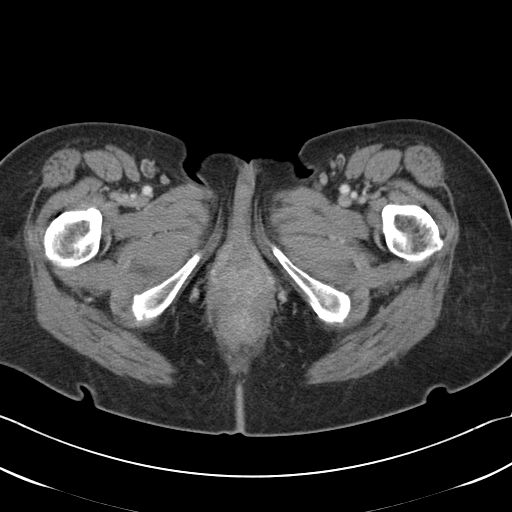
[im 5/80  bone]
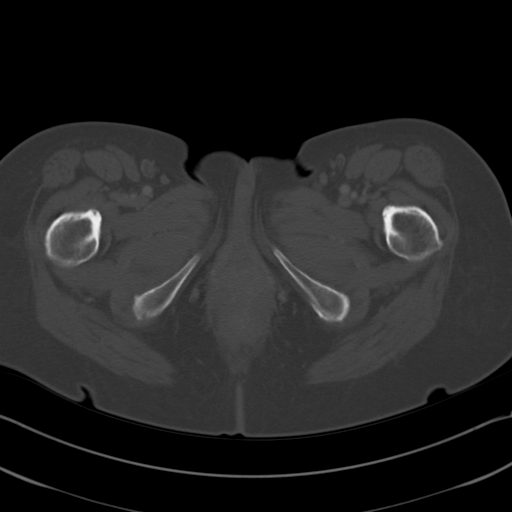
[im 13/80  soft-tissue]
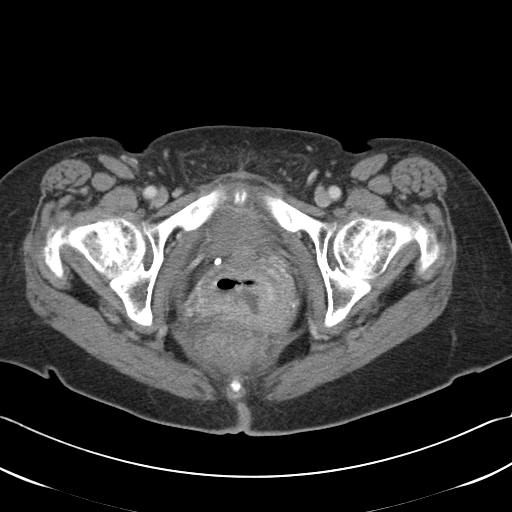
[im 17/80  soft-tissue]
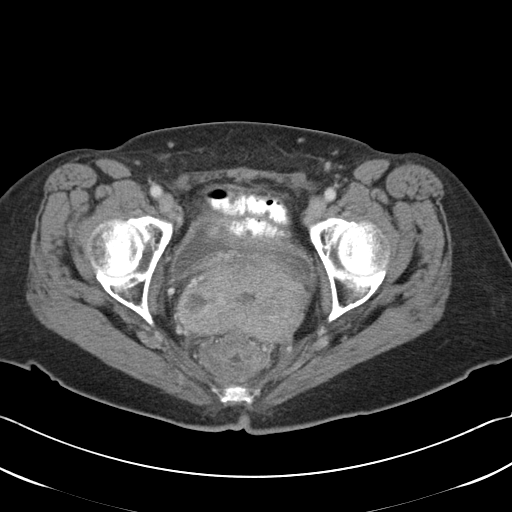
[im 21/80  soft-tissue]
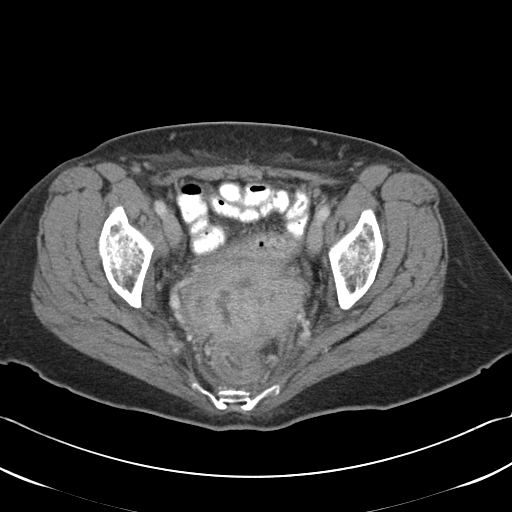
[im 30/80  soft-tissue]
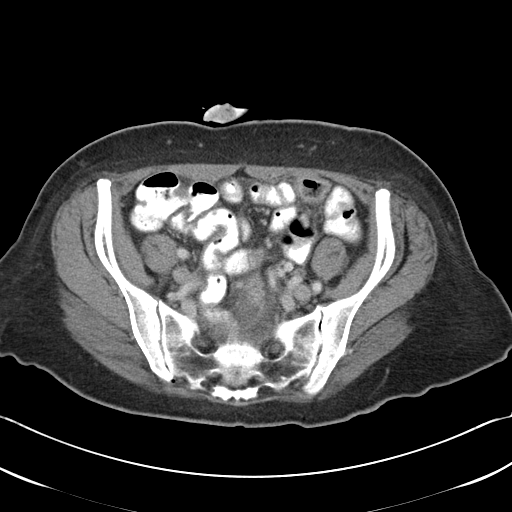
[im 34/80  soft-tissue]
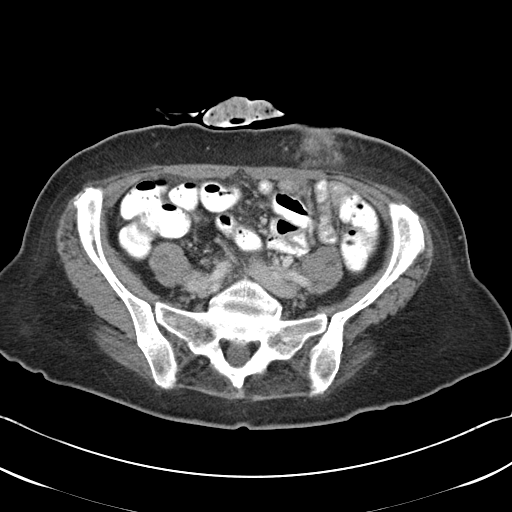
[im 42/80  soft-tissue]
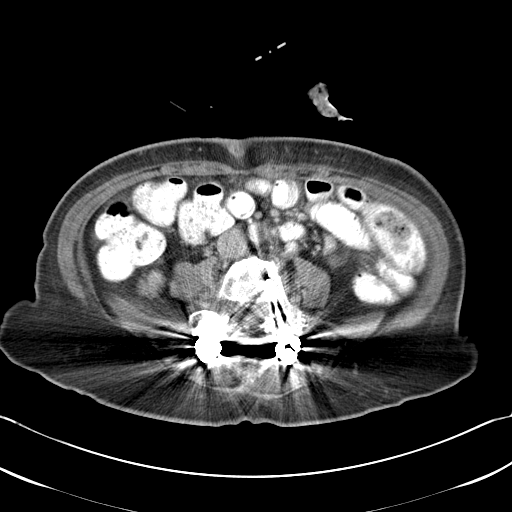
[im 46/80  soft-tissue]
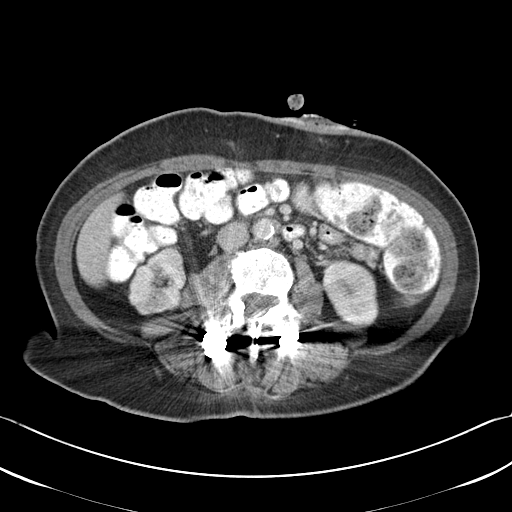
[im 50/80  soft-tissue]
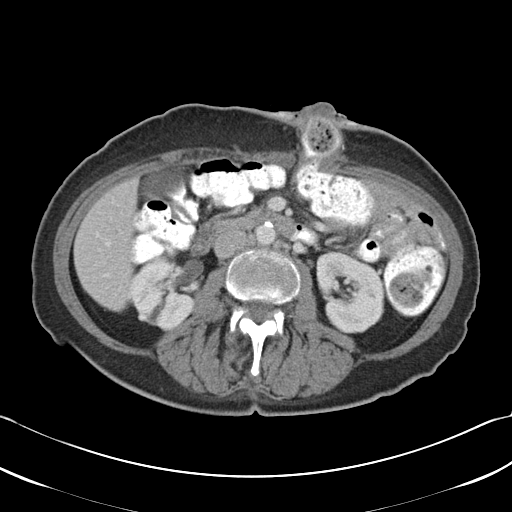
[im 50/80  bone]
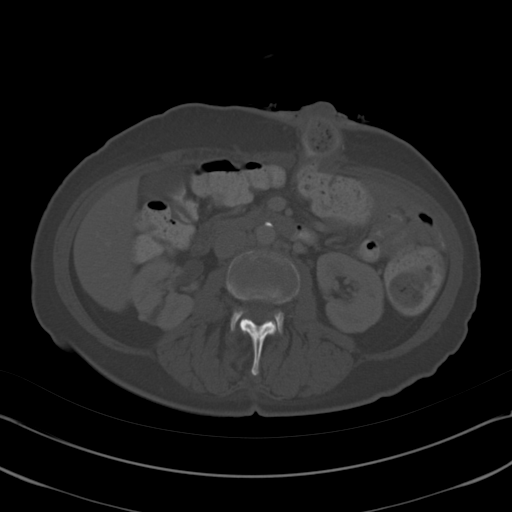
[im 59/80  soft-tissue]
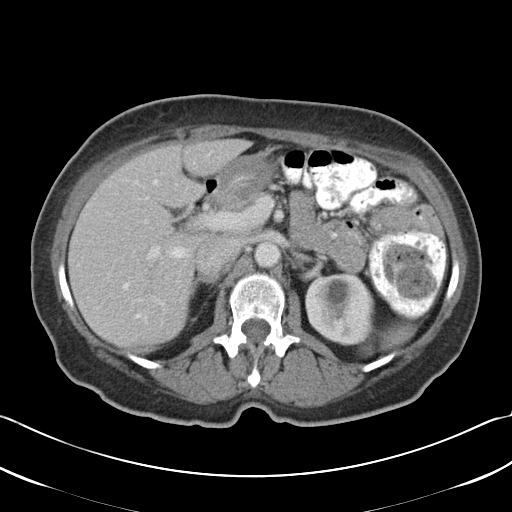
[im 63/80  soft-tissue]
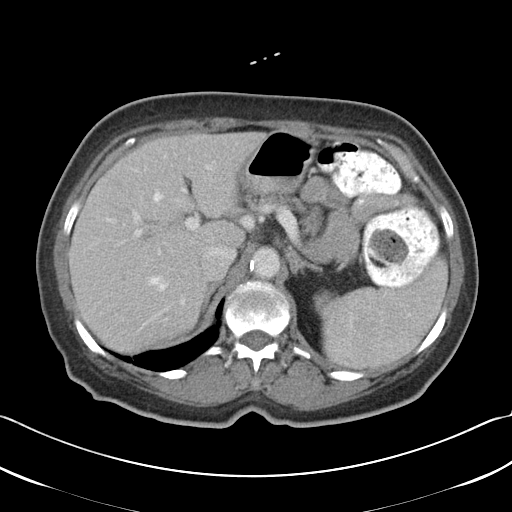
[im 67/80  soft-tissue]
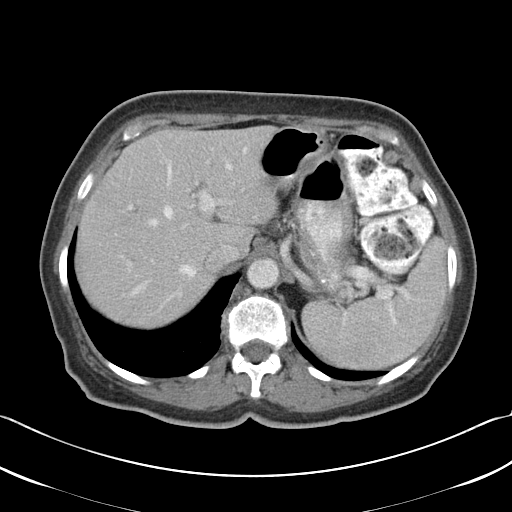
[im 75/80  soft-tissue]
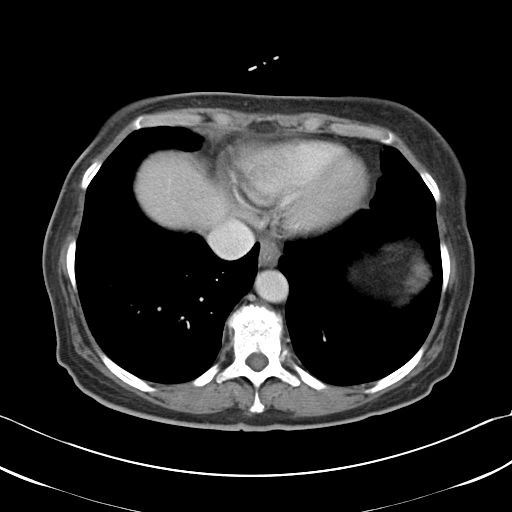

[Series 602: <mpr thick range> · coronal · 0.78mm/px · 3 of 72 slices shown]
[im 24/72  soft-tissue]
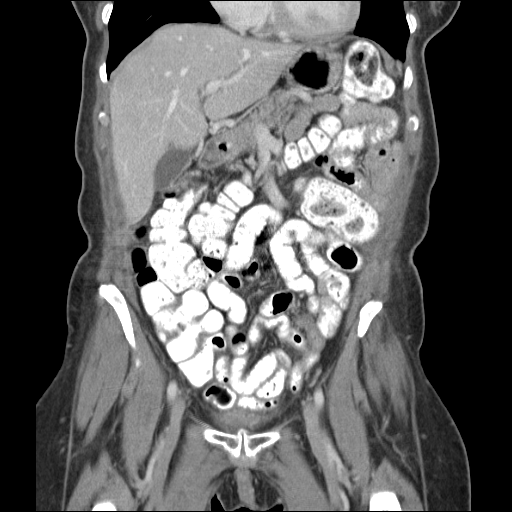
[im 32/72  soft-tissue]
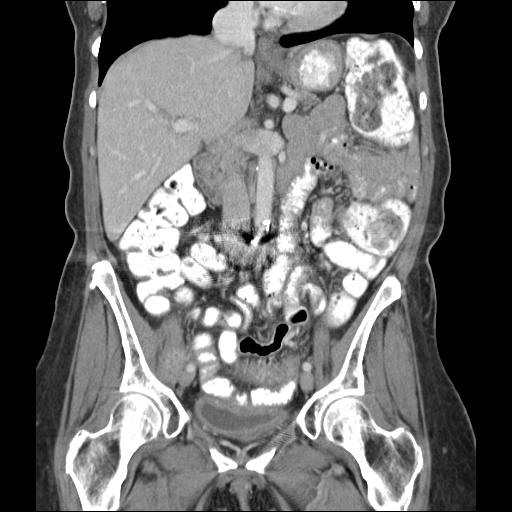
[im 40/72  soft-tissue]
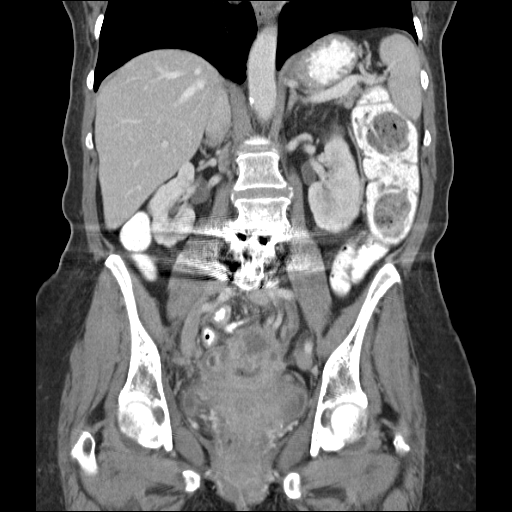

[16 of 46 positions shown; findings below may reference images not displayed]

FINDINGS: Lower chest:  Unremarkable.

Hepatobiliary: No focal abnormality within the liver parenchyma.
There is no evidence for gallstones, gallbladder wall thickening, or
pericholecystic fluid. Mild prominence of intrahepatic bile ducts is
unchanged. No extrahepatic biliary duct dilatation.

Pancreas: 8 mm cystic lesion in the head of the pancreas (image 24
series 3) is unchanged since the prior study and also comparing back
05/19/2014. No dilatation of the main pancreatic duct.

Spleen: No splenomegaly. No focal mass lesion.

Adrenals/Urinary Tract: No adrenal nodule or mass. Prominent
scarring is seen in the cortex of both kidneys, right greater than
left. 15 mm cyst in the upper pole of the left kidney is stable.

Stomach/Bowel: Stomach is nondistended. No gastric wall thickening.
No evidence of outlet obstruction. Duodenum is normally positioned
as is the ligament of Treitz. No small bowel wall thickening. No
small bowel dilatation. Terminal ileum is normal. The appendix is
not visualized, but there is no edema or inflammation in the region
of the cecum. Left lower quadrant sigmoid loop colostomy is evident.
Circumferential wall thickening is seen in the sigmoid colon as it
courses anterior to the uterus and also in the rectum, immediately
posterior to the uterus. These colonic changes are presumably
secondary to radiation treatment.

Vascular/Lymphatic: There is abdominal aortic atherosclerosis
without aneurysm. Portal vein and SMV are patent. Splenic vein is
patent.

Reproductive: Repeat irregular nodule seen along the vaginal cuff
and central pelvis on the prior CT scan have progressed
substantially in the interval. On today's study, there is a 9.3 x
4.8 x 8.6 cm heterogeneously enhancing lesion in the central pelvis,
involving the vaginal cuff. This is associated with edema in the
soft tissues of the pelvic floor.

Other: No substantial intraperitoneal free fluid.

Musculoskeletal: The right iliacus lesions seen previously has
progressed slightly in the interval, measuring 3.1 x 2.1 cm today
compared to 1.8 x 2.4 cm previously. Bone windows reveal no
worrisome lytic or sclerotic osseous lesions.
IMPRESSION: Marked interval progression of metastatic disease in the central
pelvis, with a 9 x 5 x 9 cm necrotic heterogeneously enhancing mass
now evident.

More modest interval progression of metastatic disease involving the
right iliacus muscle.

8 mm cystic lesion in the pancreatic head is stable since
05/19/2014. Continued attention to this region on followup imaging
is recommended.
